# Patient Record
Sex: Female | Born: 1945
Health system: Southern US, Community
[De-identification: ages and names within clinical notes are randomized; demographics above are authoritative.]

## PROBLEM LIST (undated history)

## (undated) DIAGNOSIS — R011 Cardiac murmur, unspecified: Secondary | ICD-10-CM

## (undated) DIAGNOSIS — E039 Hypothyroidism, unspecified: Secondary | ICD-10-CM

## (undated) DIAGNOSIS — I639 Cerebral infarction, unspecified: Secondary | ICD-10-CM

## (undated) DIAGNOSIS — I341 Nonrheumatic mitral (valve) prolapse: Secondary | ICD-10-CM

## (undated) DIAGNOSIS — H04123 Dry eye syndrome of bilateral lacrimal glands: Secondary | ICD-10-CM

## (undated) DIAGNOSIS — R350 Frequency of micturition: Secondary | ICD-10-CM

## (undated) DIAGNOSIS — R0600 Dyspnea, unspecified: Secondary | ICD-10-CM

## (undated) DIAGNOSIS — I5189 Other ill-defined heart diseases: Secondary | ICD-10-CM

## (undated) DIAGNOSIS — G459 Transient cerebral ischemic attack, unspecified: Secondary | ICD-10-CM

## (undated) DIAGNOSIS — E785 Hyperlipidemia, unspecified: Secondary | ICD-10-CM

## (undated) DIAGNOSIS — Z8489 Family history of other specified conditions: Secondary | ICD-10-CM

## (undated) DIAGNOSIS — C801 Malignant (primary) neoplasm, unspecified: Secondary | ICD-10-CM

## (undated) DIAGNOSIS — C4490 Unspecified malignant neoplasm of skin, unspecified: Secondary | ICD-10-CM

## (undated) DIAGNOSIS — G43909 Migraine, unspecified, not intractable, without status migrainosus: Secondary | ICD-10-CM

## (undated) HISTORY — PX: ESOPHAGOGASTRODUODENOSCOPY: SHX1529

## (undated) HISTORY — PX: BREAST BIOPSY: SHX20

## (undated) HISTORY — PX: BREAST EXCISIONAL BIOPSY: SUR124

## (undated) HISTORY — DX: Hypothyroidism, unspecified: E03.9

## (undated) HISTORY — DX: Unspecified malignant neoplasm of skin, unspecified: C44.90

## (undated) HISTORY — DX: Hyperlipidemia, unspecified: E78.5

## (undated) HISTORY — PX: TUBAL LIGATION: SHX77

## (undated) HISTORY — DX: Cerebral infarction, unspecified: I63.9

## (undated) HISTORY — DX: Migraine, unspecified, not intractable, without status migrainosus: G43.909

## (undated) HISTORY — PX: CHOLECYSTECTOMY: SHX55

---

## 2002-01-05 ENCOUNTER — Ambulatory Visit (HOSPITAL_COMMUNITY): Admission: RE | Admit: 2002-01-05 | Discharge: 2002-01-05 | Payer: Self-pay | Admitting: Family Medicine

## 2002-01-05 ENCOUNTER — Encounter: Payer: Self-pay | Admitting: Family Medicine

## 2002-08-03 ENCOUNTER — Encounter: Payer: Self-pay | Admitting: Family Medicine

## 2002-08-03 ENCOUNTER — Ambulatory Visit (HOSPITAL_COMMUNITY): Admission: RE | Admit: 2002-08-03 | Discharge: 2002-08-03 | Payer: Self-pay | Admitting: Family Medicine

## 2002-08-09 ENCOUNTER — Ambulatory Visit (HOSPITAL_COMMUNITY): Admission: RE | Admit: 2002-08-09 | Discharge: 2002-08-09 | Payer: Self-pay | Admitting: Family Medicine

## 2002-08-09 ENCOUNTER — Encounter: Payer: Self-pay | Admitting: Family Medicine

## 2002-11-29 ENCOUNTER — Encounter: Payer: Self-pay | Admitting: Family Medicine

## 2002-11-29 ENCOUNTER — Ambulatory Visit (HOSPITAL_COMMUNITY): Admission: RE | Admit: 2002-11-29 | Discharge: 2002-11-29 | Payer: Self-pay | Admitting: Family Medicine

## 2003-03-03 ENCOUNTER — Ambulatory Visit (HOSPITAL_COMMUNITY): Admission: RE | Admit: 2003-03-03 | Discharge: 2003-03-03 | Payer: Self-pay | Admitting: Family Medicine

## 2003-03-08 ENCOUNTER — Ambulatory Visit (HOSPITAL_COMMUNITY): Admission: RE | Admit: 2003-03-08 | Discharge: 2003-03-08 | Payer: Self-pay | Admitting: *Deleted

## 2004-04-11 ENCOUNTER — Ambulatory Visit (HOSPITAL_COMMUNITY): Admission: RE | Admit: 2004-04-11 | Discharge: 2004-04-11 | Payer: Self-pay | Admitting: Family Medicine

## 2004-08-05 ENCOUNTER — Ambulatory Visit: Payer: Self-pay | Admitting: Internal Medicine

## 2004-08-13 ENCOUNTER — Ambulatory Visit: Payer: Self-pay | Admitting: Internal Medicine

## 2004-08-13 ENCOUNTER — Ambulatory Visit (HOSPITAL_COMMUNITY): Admission: RE | Admit: 2004-08-13 | Discharge: 2004-08-13 | Payer: Self-pay

## 2004-10-17 ENCOUNTER — Ambulatory Visit: Payer: Self-pay | Admitting: Orthopedic Surgery

## 2004-11-11 ENCOUNTER — Ambulatory Visit: Payer: Self-pay | Admitting: Orthopedic Surgery

## 2004-12-09 ENCOUNTER — Ambulatory Visit: Payer: Self-pay | Admitting: Orthopedic Surgery

## 2006-03-27 ENCOUNTER — Ambulatory Visit (HOSPITAL_COMMUNITY): Admission: RE | Admit: 2006-03-27 | Discharge: 2006-03-27 | Payer: Self-pay | Admitting: Family Medicine

## 2006-04-07 ENCOUNTER — Ambulatory Visit: Payer: Self-pay | Admitting: Orthopedic Surgery

## 2006-09-01 ENCOUNTER — Ambulatory Visit (HOSPITAL_COMMUNITY): Admission: RE | Admit: 2006-09-01 | Discharge: 2006-09-01 | Payer: Self-pay | Admitting: *Deleted

## 2008-08-03 ENCOUNTER — Ambulatory Visit (HOSPITAL_COMMUNITY): Admission: RE | Admit: 2008-08-03 | Discharge: 2008-08-03 | Payer: Self-pay | Admitting: Family Medicine

## 2008-08-03 ENCOUNTER — Encounter: Payer: Self-pay | Admitting: Family Medicine

## 2008-08-08 ENCOUNTER — Ambulatory Visit (HOSPITAL_COMMUNITY): Admission: RE | Admit: 2008-08-08 | Discharge: 2008-08-08 | Payer: Self-pay | Admitting: Family Medicine

## 2008-08-15 ENCOUNTER — Encounter (INDEPENDENT_AMBULATORY_CARE_PROVIDER_SITE_OTHER): Payer: Self-pay | Admitting: Diagnostic Radiology

## 2008-08-15 ENCOUNTER — Ambulatory Visit (HOSPITAL_COMMUNITY): Admission: RE | Admit: 2008-08-15 | Discharge: 2008-08-15 | Payer: Self-pay | Admitting: Family Medicine

## 2009-04-11 ENCOUNTER — Ambulatory Visit (HOSPITAL_COMMUNITY): Admission: RE | Admit: 2009-04-11 | Discharge: 2009-04-11 | Payer: Self-pay | Admitting: Endocrinology

## 2009-08-31 ENCOUNTER — Encounter (INDEPENDENT_AMBULATORY_CARE_PROVIDER_SITE_OTHER): Payer: Self-pay

## 2009-09-03 ENCOUNTER — Ambulatory Visit (HOSPITAL_COMMUNITY): Admission: RE | Admit: 2009-09-03 | Discharge: 2009-09-03 | Payer: Self-pay | Admitting: Endocrinology

## 2010-04-30 NOTE — Letter (Signed)
Summary: Recall, Screening Colonoscopy Only  [facility]  [address]   [place], [place] [address]   Phone: [phone]  Fax: [phone]    August 31, 2009  [facility] [address] [place], [place]  [address] [birth year]   Dear Ms. Grothe,   Our records indicate it is time to schedule your colonoscopy.   Please call our office at [phone] and ask for the nurse.   Thank you,    Hendricks Limes, LPN Cloria Spring, LPN  [facility] Ph: [phone]   Fax: [phone]

## 2010-05-08 ENCOUNTER — Ambulatory Visit (HOSPITAL_COMMUNITY)
Admission: RE | Admit: 2010-05-08 | Discharge: 2010-05-08 | Disposition: A | Discharge status: Home / Self Care | Payer: 59 | Source: Ambulatory Visit | Attending: Family Medicine | Admitting: Family Medicine

## 2010-05-08 ENCOUNTER — Other Ambulatory Visit (HOSPITAL_COMMUNITY): Payer: Self-pay | Admitting: Family Medicine

## 2010-05-08 ENCOUNTER — Encounter (HOSPITAL_COMMUNITY): Payer: Self-pay

## 2010-05-08 DIAGNOSIS — M542 Cervicalgia: Secondary | ICD-10-CM

## 2010-05-08 DIAGNOSIS — M25512 Pain in left shoulder: Secondary | ICD-10-CM

## 2010-05-08 DIAGNOSIS — M25519 Pain in unspecified shoulder: Secondary | ICD-10-CM | POA: Insufficient documentation

## 2010-05-08 DIAGNOSIS — M5030 Other cervical disc degeneration, unspecified cervical region: Secondary | ICD-10-CM | POA: Insufficient documentation

## 2010-08-16 NOTE — Op Note (Signed)
Grace [name], SHIMKUS                 ACCOUNT NO.:  [id number]   MEDICAL RECORD NO.:  [id number]          PATIENT TYPE:  AMB   LOCATION:  DAY                           FACILITY:  [facility]   PHYSICIAN:  Lionel December, M.D.    DATE OF BIRTH:  03/08/1946   DATE OF PROCEDURE:  08/13/2004  DATE OF DISCHARGE:                                 OPERATIVE REPORT   PROCEDURE:  Esophagogastroduodenoscopy with esophageal dilation followed by  colonoscopy with polypectomy.   INDICATION:  Walsie is a 65 year old Caucasian female who presents with  intermittent solid food dysphagia of at least 1 year duration. She points to  her suprasternal area as the site of bolus obstruction. She denies heartburn  or regurgitation. She is also undergoing surveillance colonoscopy. She had  adenomas removed on two prior colonoscopies. The first colonoscopy was in  67s, the last exam was about five years ago. The procedure risks were  reviewed with the patient, informed consent was obtained.   PREMEDICATION:  Cetacaine spray pharyngeal topical anesthesia, Demerol 50  milligrams IV, Versed 8 milligrams IV in divided dose.   FINDINGS:  Procedures performed in endoscopy suite. The patient's vital  signs and O2 sat were monitored during procedure and remained stable.   PROCEDURE:  1 - Esophagogastroduodenoscopy. The patient was placed left  lateral position and Olympus videoscope was passed via oropharynx without  any difficulty into the esophagus.   Esophagus. Mucosa was normal. There was suspicion of web in cervical  esophagus. This not very prominent. GE junction was at 38 cm from the  incisors. No ring or hernia was noted.   Stomach. It was empty and distended very well with insufflation. Folds of  proximal stomach were normal. Examination of mucosa revealed patchy erythema  at antrum and three small polyps at gastric body along the posterior wall,  which were ablated via cold biopsy. Pyloric channel was patent.  Angularis,  fundus and cardia examined by retroflexing the scope and were normal.   Duodenum.  Bulbar mucosa was normal. Scope was passed to the second part of  duodenum. The mucosa and folds were normal. Endoscope was withdrawn.   Esophagus was dilated by passing 56-[facility] dilator. As the dilator  was withdrawn endoscope was passed again and there was a small wide tear at  cervical esophagus disrupting the valve. Pictures taken for the record.  Endoscope was withdrawn and the patient prepared for procedure #2.   Colonoscopy. Rectal examination performed. No abnormality noted external or  digital exam. Olympus videoscope was placed rectum and advanced under vision  in sigmoid colon beyond. Preparation was satisfactory. Scope was passed  cecum which was identified by ileocecal valve and appendiceal orifice.  Pictures taken for the record. As the scope was withdrawn, colonic mucosa  was carefully examined. There were three small polyps midtransverse colon.  Two of these were snared and submitted in one container. A third polyp was  much smaller and simply coagulated using snare tip. Mucosa of rest of the  colon was normal. Rectal mucosa similarly was normal. Scope was retroflexed  to  examine anorectal junction which was unremarkable. Endoscope was  straightened and withdrawn. The patient tolerated the procedure well.   FINAL DIAGNOSIS:  Esophageal valve which was disrupted by passing 56-French  [facility] dilator. Three small gastric polyps at body which ablated via cold  biopsy. Nonerosive antral gastritis.  Two small polyps snared from the  transverse colon and third much smaller polyp was coagulated also at  transverse colon.   RECOMMENDATIONS:  Standard instructions given.  H pylori serology will be  checked. I will be contacting the patient with results of biopsy and blood  test and further recommendations. Given that she had polyps on all prior  colonoscopies she should  return for follow-up exam in 5 years from now.      NR/MEDQ  D:  08/13/2004  T:  08/13/2004  Job:  [id number]   cc:   Lorin Picket A. Gerda Diss, MD  [address]  [place]  [place] [address]  Fax: [phone]

## 2010-08-16 NOTE — H&P (Signed)
NAMEADELA, ESTEBAN                 ACCOUNT NO.:  [id number]   MEDICAL RECORD NO.:  [id number]          PATIENT TYPE:  AMB   LOCATION:  DAY                           FACILITY:  [facility]   PHYSICIAN:  Lionel December, M.D.    DATE OF BIRTH:  [birth year]   DATE OF ADMISSION:  DATE OF DISCHARGE:  LH                                HISTORY & PHYSICAL   PRIMARY CARE PHYSICIAN:  Scott A. Gerda Diss, M.D.   REASON:  Follow up colonoscopy.  History of adenomatous polyps.  EGD for  dysphagia.   HISTORY OF PRESENT ILLNESS:  Ms. Forton is a 65 year old Caucasian female  with a history of two adenomatous polyps on last colonoscopy by Dr. Karilyn Cota  in 2001.  She also has a history of previous adenomatous colon polyps.  She  is due for repeat surveillance exam.  She reports some constipation with  small, hard, round stools.  She also complains of abdominal bloating, which  has been worse over the last month.  She denies any problems with diarrhea  or loose stools.  She has a bowel movement basically every day or every  other day.  She denies any abdominal pain.  She denies any mucous in her  stools.  She does report at least once a week episodes of heartburn and  indigestion.  Denies any regurgitation.  She denies any odynophagia, but  does report dysphagia, and the sensation that her food will not go down.  This generally happens with solids.  Denies any problems with liquids.  She  has had some nausea.  Denies any emesis.  Denies any shortness of breath or  diaphoresis.  She takes an occasional ibuprofen.   PAST MEDICAL HISTORY:  1.  Mitral valve prolapse.  2.  History of adenomatous polyps diagnosed in the 1980s.  The last      colonoscopy in 2001 by Dr. Karilyn Cota with removal of 1, 2 and 3 mm polyps      from the cecum, which were adenomatous.  3.  Left breast benign breast tumor.  4.  Tubal ligation.  5.  Cholecystectomy in the 1980s.   CURRENT MEDICATIONS:  1.  Evista once daily.  2.  Tylenol p.r.n.  3.  Ibuprofen p.r.n.   ALLERGIES:  1.  SULFA which causes a rash.  2.  COMPAZINE which causes angioedema.   FAMILY HISTORY:  There is no known family history of colorectal carcinoma,  liver, or chronic GI problems.  Mother, age 41, with history of hypertension  and thyroid disease.  Father, age 46, is healthy.   SOCIAL HISTORY:  Ms. Nottingham has been married for 34 years.  She has 2 grown  healthy daughters.  She is employed full-time with [employer].  She  denies any tobacco, alcohol, or drug use.   REVIEW OF SYSTEMS:  CONSTITUTIONAL:  Stable.  She denies any anorexia.  Denies any fever or chills.  CARDIOVASCULAR:  Denies any chest pain or  palpitations.  PULMONARY:  Denies any shortness of breath, dyspnea, cough,  or hemoptysis.  GI:  See HPI.  GYN:  She is postmenopausal.  Denies any  problems with vaginal bleeding.   PHYSICAL EXAMINATION:  VITAL SIGNS:  Weight 143 pounds, height 63 inches,  temperature 98.4, blood pressure 110/68, pulse 72.  GENERAL:  Ms. Stolze is a healthy, well-developed, well-nourished Caucasian  female in no acute distress.  HEENT:  Pupils equal, round and reactive to light.  Sclerae are clear and  nonicteric.  Conjunctivae pink.  Oropharynx pink and moist without lesions.  NECK:  Supple without any mass or thyromegaly.  HEART:  Regular rate and rhythm with a 3/6 systolic murmur.  No clicks,  rubs, or gallops.  LUNGS:  Clear to auscultation bilaterally.  ABDOMEN:  Positive bowel sounds x4.  Soft, nontender, nondistended, without  palpable mass or hepatosplenomegaly.  No rebound tenderness or guarding.  RECTAL:  Deferred.  EXTREMITIES:  There were 2+ pedal pulses bilaterally.  Trace edema.  SKIN:  Pink, warm, and dry without any rash or jaundice.   IMPRESSION:  Ms. Latella is a 65 year old Caucasian female with history of  adenomatous colon polyps who is due for colonoscopy at this time.  She  agrees with this plan.   She also has a history of  intermittent nausea and solid food dysphagia,  which she be further evaluated at this time.  She notes intermittent weekly  or more heartburn and indigestion.  She is not currently on PPI therapy.   RECOMMENDATIONS:  1.  Will schedule colonoscopy with possible polypectomy by Dr. Karilyn Cota in the      near future.  Will also schedule EGD with possible esophageal dilatation      at the same time to further assess her upper GI tract and rule out      complications of GERD, including the development of web, ring, or      stricture.  I have discussed both procedures including risks and      benefits including, but not limited to, bleeding, infection,      perforation, drug reaction.  She agrees with this plan, and consent will      be obtained.  2.  She may need to begin PPI therapy.  3.  Will give SBE prophylaxis, given history of mitral valve prolapse.  4.  Further recommendations to follow.     KC/MEDQ  D:  08/05/2004  T:  08/05/2004  Job:  [id number]   cc:   Ernestina Penna  [address]  [place]  [place] [address]  Fax: [phone]

## 2010-10-11 ENCOUNTER — Other Ambulatory Visit (HOSPITAL_COMMUNITY): Payer: Self-pay | Admitting: Endocrinology

## 2010-10-11 DIAGNOSIS — E049 Nontoxic goiter, unspecified: Secondary | ICD-10-CM

## 2010-10-22 ENCOUNTER — Ambulatory Visit (HOSPITAL_COMMUNITY)
Admission: RE | Admit: 2010-10-22 | Discharge: 2010-10-22 | Disposition: A | Discharge status: Home / Self Care | Payer: 59 | Source: Ambulatory Visit | Attending: Endocrinology | Admitting: Endocrinology

## 2010-10-22 DIAGNOSIS — E079 Disorder of thyroid, unspecified: Secondary | ICD-10-CM | POA: Insufficient documentation

## 2010-10-22 DIAGNOSIS — E049 Nontoxic goiter, unspecified: Secondary | ICD-10-CM

## 2010-11-01 ENCOUNTER — Telehealth (INDEPENDENT_AMBULATORY_CARE_PROVIDER_SITE_OTHER): Payer: Self-pay | Admitting: *Deleted

## 2010-11-01 ENCOUNTER — Encounter (INDEPENDENT_AMBULATORY_CARE_PROVIDER_SITE_OTHER): Payer: Self-pay | Admitting: *Deleted

## 2010-11-01 DIAGNOSIS — Z86010 Personal history of colonic polyps: Secondary | ICD-10-CM

## 2010-11-01 NOTE — Telephone Encounter (Signed)
TCS sch'd 12/18/10 @ 1:00 (12:00), osmo prep instr given

## 2010-11-11 MED ORDER — SOD PHOS MONO-SOD PHOS DIBASIC 1.102-0.398 G PO TABS: 1.0000 | ORAL_TABLET | Freq: Once | ORAL | Status: DC

## 2010-11-28 ENCOUNTER — Encounter: Payer: Self-pay | Admitting: Adult Health

## 2010-11-28 ENCOUNTER — Ambulatory Visit (INDEPENDENT_AMBULATORY_CARE_PROVIDER_SITE_OTHER): Payer: 59 | Admitting: Adult Health

## 2010-11-28 DIAGNOSIS — R0609 Other forms of dyspnea: Secondary | ICD-10-CM

## 2010-11-28 DIAGNOSIS — R0989 Other specified symptoms and signs involving the circulatory and respiratory systems: Secondary | ICD-10-CM

## 2010-11-28 DIAGNOSIS — E039 Hypothyroidism, unspecified: Secondary | ICD-10-CM

## 2010-11-28 DIAGNOSIS — E7800 Pure hypercholesterolemia, unspecified: Secondary | ICD-10-CM

## 2010-11-28 DIAGNOSIS — R0789 Other chest pain: Secondary | ICD-10-CM

## 2010-11-28 DIAGNOSIS — R0600 Dyspnea, unspecified: Secondary | ICD-10-CM | POA: Insufficient documentation

## 2010-11-28 DIAGNOSIS — R002 Palpitations: Secondary | ICD-10-CM

## 2010-11-28 NOTE — Patient Instructions (Signed)
**Note De-Identified Grace [name] Obfuscation** Your physician recommends that you return for lab work in: today  Your physician has requested that you have a stress echocardiogram. For further information please visit [email or web address] Please follow instruction sheet as given.  A chest x-ray takes a picture of the organs and structures inside the chest, including the heart, lungs, and blood vessels. This test can show several things, including, whether the heart is enlarges; whether fluid is building up in the lungs; and whether pacemaker / defibrillator leads are still in place.  Your physician recommends that you schedule a follow-up appointment in: after test

## 2010-11-28 NOTE — Progress Notes (Signed)
HPI:Grace [name] is a pleasant 65 y/o patient we are seeing that the kind request of Dr. Gerda Diss for patient complaints of chest pressure, DOE, and palpitations. She has been having these symptoms over the last few months and has had progressive fatigue. She is recently widowed in March of this year and her mother died in 2022/07/20 of this year. She feels a lot of what she is experiencing is a delayed stress reaction from these family tragedies, but after seeing Dr. Gerda Diss he wanted her to be checked out by cardiology, as is appropriate.  She has a history of hypothyroidism and hypercholesterolemia.  She states the chest pressure occurs at rest. Palpitations are almost daily. She is also having some right flank pain and is requesting a urinalysis, She denies dizziness or NVD associated with her symptoms.  Allergies  Allergen Reactions  . Compazine   . Sulfa Drugs Cross Reactors     Current Outpatient Prescriptions  Medication Sig Dispense Refill  . Calcium Carbonate-Vitamin D (OSCAL 500/200 D-3 PO) Take by mouth daily.        Marland Kitchen levothyroxine (SYNTHROID, LEVOTHROID) 25 MCG tablet       . Multiple Vitamins-Minerals (ALIVE WOMENS 50+) TABS Take by mouth daily.        . sodium phosphates (OSMOPREP) 1.102-0.398 G TABS Take 1 tablet by mouth once.  32 tablet  0    Past Medical History  Diagnosis Date  . Hypothyroid   . Hyperlipidemia     Diet controlled    Past Surgical History  Procedure Date  . Breast biopsy   . Tubal ligation     38 yrs ago.    Family History  Problem Relation Age of Onset  . Heart disease Mother   . Hypertension Mother     History   Social History  . Marital Status: Married    Spouse Name: N/A    Number of Children: N/A  . Years of Education: N/A   Occupational History  . Not on file.   Social History Main Topics  . Smoking status: Never Smoker   . Smokeless tobacco: Not on file  . Alcohol Use: Not on file  . Drug Use: Not on file  . Sexually Active: Not on  file   Other Topics Concern  . Not on file   Social History Narrative  . No narrative on file    BJY:NWGNFA of systems complete and found to be negative unless listed above  PHYSICAL EXAM BP 103/60  Pulse 87  Resp 18  Ht 5\' 3"  (1.6 m)  Wt 141 lb 1.9 oz (64.012 kg)  BMI 25.00 kg/m2  SpO2 99% General: Well developed, well nourished, in no acute distress Head: Eyes PERRLA, No xanthomas.   Normal cephalic and atramatic  Lungs: Clear bilaterally to auscultation and percussion. Heart: HRRR S1 S2,.  Pulses are 2+ & equal.            No carotid bruit. No JVD.  No abdominal bruits. No femoral bruits. Abdomen: Bowel sounds are positive, abdomen soft and non-tender without masses or                  Hernia's noted. Msk:  Back normal, normal gait. Normal strength and tone for age. Extremities: No clubbing, cyanosis or edema.  DP +1 Neuro: Alert and oriented X 3. Psych:  Good affect, responds appropriately  EKG: NSR rate of 80 bpm.  RSR' in V1 suggesting RV conduction delay.  Anteroseptal infarct with  T-Wave inversion V1, V2 and aVR.(No prior EKG to compare in E-chart, MUSE, or EMR)  ASSESSMENT AND PLAN

## 2010-11-28 NOTE — Assessment & Plan Note (Signed)
Most recent labs completed this month TC 137, HDL-31, LDL 79, TG 137.  She is not on a statin now, but had been in the recent past. She wished to be diet controlled. Dr. Gerda Diss will check her again in 3 months.

## 2010-11-28 NOTE — Assessment & Plan Note (Signed)
The patient has been seen and examined by me and by Dr. Dietrich Pates in clinic. She has some symptoms suggestive of cardiac etiology for dyspnea. This can also be emotionally induced via anxiety, however there does not appear to be a straight forward diagnosis of this and no evidence of this in clinic.  Plan to have patient scheduled for a stress echo. CBC will be completed as well.  She will follow-up with Dr. Dietrich Pates for discussion of test results.

## 2010-12-03 ENCOUNTER — Other Ambulatory Visit: Payer: Self-pay

## 2010-12-03 ENCOUNTER — Ambulatory Visit (HOSPITAL_COMMUNITY)
Admission: RE | Admit: 2010-12-03 | Discharge: 2010-12-03 | Disposition: A | Discharge status: Home / Self Care | Payer: 59 | Source: Ambulatory Visit | Attending: Adult Health | Admitting: Adult Health

## 2010-12-03 DIAGNOSIS — R0609 Other forms of dyspnea: Secondary | ICD-10-CM

## 2010-12-03 DIAGNOSIS — R0789 Other chest pain: Secondary | ICD-10-CM | POA: Insufficient documentation

## 2010-12-03 DIAGNOSIS — R0989 Other specified symptoms and signs involving the circulatory and respiratory systems: Secondary | ICD-10-CM

## 2010-12-03 DIAGNOSIS — R0602 Shortness of breath: Secondary | ICD-10-CM | POA: Insufficient documentation

## 2010-12-04 LAB — CBC WITH DIFFERENTIAL/PLATELET
Basophils Absolute: 0 10*3/uL (ref 0.0–0.1)
Basophils Relative: 1 % (ref 0–1)
Eosinophils Absolute: 0.2 10*3/uL (ref 0.0–0.7)
Eosinophils Relative: 2 % (ref 0–5)
HCT: 40.8 % (ref 36.0–46.0)
Hemoglobin: 13.9 g/dL (ref 12.0–15.0)
Lymphocytes Relative: 32 % (ref 12–46)
Lymphs Abs: 2.5 10*3/uL (ref 0.7–4.0)
MCH: 30.6 pg (ref 26.0–34.0)
MCHC: 34.1 g/dL (ref 30.0–36.0)
MCV: 89.9 fL (ref 78.0–100.0)
Monocytes Absolute: 0.7 10*3/uL (ref 0.1–1.0)
Monocytes Relative: 10 % (ref 3–12)
Neutro Abs: 4.4 10*3/uL (ref 1.7–7.7)
Neutrophils Relative %: 57 % (ref 43–77)
Platelets: 177 10*3/uL (ref 150–400)
RBC: 4.54 MIL/uL (ref 3.87–5.11)
RDW: 12.9 % (ref 11.5–15.5)
WBC: 7.8 10*3/uL (ref 4.0–10.5)

## 2010-12-09 ENCOUNTER — Telehealth: Payer: Self-pay | Admitting: Cardiology

## 2010-12-09 NOTE — Telephone Encounter (Signed)
Patient would like results of lab work and CXR / tg

## 2010-12-13 ENCOUNTER — Ambulatory Visit: Payer: 59 | Admitting: Cardiology

## 2010-12-17 MED ORDER — SODIUM CHLORIDE 0.45 % IV SOLN: Freq: Once | INTRAVENOUS | Status: DC

## 2010-12-18 ENCOUNTER — Encounter (HOSPITAL_COMMUNITY)
Admission: RE | Disposition: A | Discharge status: Home / Self Care | Payer: Self-pay | Source: Ambulatory Visit | Attending: Internal Medicine

## 2010-12-18 ENCOUNTER — Other Ambulatory Visit (INDEPENDENT_AMBULATORY_CARE_PROVIDER_SITE_OTHER): Payer: Self-pay | Admitting: Internal Medicine

## 2010-12-18 ENCOUNTER — Encounter (HOSPITAL_COMMUNITY): Payer: Self-pay | Admitting: *Deleted

## 2010-12-18 ENCOUNTER — Ambulatory Visit (HOSPITAL_COMMUNITY)
Admission: RE | Admit: 2010-12-18 | Discharge: 2010-12-18 | Disposition: A | Discharge status: Home / Self Care | Payer: 59 | Source: Ambulatory Visit | Attending: Internal Medicine | Admitting: Internal Medicine

## 2010-12-18 DIAGNOSIS — Z860100 Personal history of colon polyps, unspecified: Secondary | ICD-10-CM | POA: Insufficient documentation

## 2010-12-18 DIAGNOSIS — D126 Benign neoplasm of colon, unspecified: Secondary | ICD-10-CM

## 2010-12-18 DIAGNOSIS — Z1211 Encounter for screening for malignant neoplasm of colon: Secondary | ICD-10-CM

## 2010-12-18 DIAGNOSIS — E785 Hyperlipidemia, unspecified: Secondary | ICD-10-CM | POA: Insufficient documentation

## 2010-12-18 HISTORY — PX: COLONOSCOPY: SHX5424

## 2010-12-18 SURGERY — COLONOSCOPY
Anesthesia: Moderate Sedation

## 2010-12-18 MED ORDER — MIDAZOLAM HCL 5 MG/5ML IJ SOLN
INTRAMUSCULAR | Status: DC | PRN
  Administered 2010-12-18 (×2): 1 mg via INTRAVENOUS
  Administered 2010-12-18: 2 mg via INTRAVENOUS
  Administered 2010-12-18: 1 mg via INTRAVENOUS
  Administered 2010-12-18: 2 mg via INTRAVENOUS

## 2010-12-18 MED ORDER — MEPERIDINE HCL 50 MG/ML IJ SOLN
INTRAMUSCULAR | Status: DC | PRN
  Administered 2010-12-18 (×2): 25 mg via INTRAVENOUS

## 2010-12-18 MED ORDER — MEPERIDINE HCL 50 MG/ML IJ SOLN
INTRAMUSCULAR | Status: AC
  Filled 2010-12-18: qty 1

## 2010-12-18 MED ORDER — MIDAZOLAM HCL 5 MG/5ML IJ SOLN
INTRAMUSCULAR | Status: AC
  Filled 2010-12-18: qty 10

## 2010-12-18 NOTE — Op Note (Signed)
COLONOSCOPY PROCEDURE REPORT  PATIENT:  Grace [name]  MR#:  [id number] Birthdate:  08-07-1945, 65 y.o., female Endoscopist:  Dr. Malissa Hippo, MD Referred By:  Dr. Lilyan Punt, MD Procedure Date: 12/18/2010  Procedure:   Colonoscopy  Indications: History of colonic adenoma.  Informed Consent:  Procedure and risks were reviewed with the patient and informed consent was obtained  Medications:  Demerol 50 mg IV Versed 7 mg IV  Description of procedure:  After a digital rectal exam was performed, that colonoscope was advanced from the anus through the rectum and colon to the area of the cecum, ileocecal valve and appendiceal orifice. The cecum was deeply intubated. These structures were well-seen and photographed for the record. From the level of the cecum and ileocecal valve, the scope was slowly and cautiously withdrawn. The mucosal surfaces were carefully surveyed utilizing scope tip to flexion to facilitate fold flattening as needed. The scope was pulled down into the rectum where a thorough exam including retroflexion was performed.  Findings:   Prep excellent. 2 small polyps ablated via cold biopsy from ascending colon and submitted in one container. 5 mm polyp ablated via cold biopsy from proximal transverse colon. 4 mm polyp ablated via cold biopsy from distal transverse colon. 4 mm polyp ablated via cold biopsy from descending colon. Rest of the examination was normal.  Therapeutic/Diagnostic Maneuvers Performed:  See above  Complications:  None  Cecal Withdrawal Time:  25 minutes  Impression:  Examination performed to cecum. 5 small polyps ablated via cold biopsy; 2 from a sending colon and submitted in one container; 2 from transverse colon and submitted in separate containers and one from descending colon.  Recommendations:  Standard instructions given. I will be contacting patient with results of biopsy and further recommendations.  REHMAN,NAJEEB U  12/18/2010  1:25 PM  CC: Dr. Lilyan Punt, MD, MD & Dr. Bonnetta Barry ref. provider found

## 2010-12-18 NOTE — H&P (Signed)
Grace [name] is an 65 y.o. female.   Chief Complaint: Patient is here for colonoscopy. HPI: Patient is 65 year old Caucasian female who has history of colonic adenoma. Her last exam was in May 2006. She is presently free of any GI symptoms. She denies abdominal pain, change in her bowel habits or rectal bleeding. she has good appetite; family history is negative for colorectal carcinoma.  Past Medical History  Diagnosis Date  . Hypothyroid   . Hyperlipidemia     Diet controlled    Past Surgical History  Procedure Date  . Breast biopsy   . Tubal ligation     38 yrs ago.    Family History  Problem Relation Age of Onset  . Heart disease Mother   . Hypertension Mother    Social History:  reports that she has never smoked. She does not have any smokeless tobacco history on file. Her alcohol and drug histories not on file.  Allergies:  Allergies  Allergen Reactions  . Compazine   . Sulfa Drugs Cross Reactors     Medications Prior to Admission  Medication Dose Route Frequency Provider Last Rate Last Dose  . 0.45 % sodium chloride infusion   Intravenous Once Malissa Hippo, MD      . meperidine (DEMEROL) 50 MG/ML injection           . midazolam (VERSED) 5 MG/5ML injection            Medications Prior to Admission  Medication Sig Dispense Refill  . calcium-vitamin D (OSCAL-500) 500-400 MG-UNIT per tablet Take 1 tablet by mouth daily.        . ciprofloxacin (CIPRO) 500 MG tablet Take 500 mg by mouth 2 (two) times daily. Take for 10 days       . levothyroxine (SYNTHROID, LEVOTHROID) 25 MCG tablet       . sodium phosphates (OSMOPREP) 1.102-0.398 G TABS Take 1 tablet by mouth once.  32 tablet  0    No results found for this or any previous visit (from the past 48 hour(s)). No results found.  Review of Systems  Constitutional: Negative for weight loss.  Gastrointestinal: Negative for abdominal pain, diarrhea, constipation, blood in stool and melena.    Blood pressure 115/64,  pulse 62, temperature 98.1 F (36.7 C), temperature source Oral, resp. rate 22, height 5\' 3"  (1.6 m), weight 141 lb 1.9 oz (64.01 kg), SpO2 98.00%. Physical Exam  Constitutional: She appears well-developed and well-nourished.  HENT:  Mouth/Throat: Oropharynx is clear and moist.  Eyes: Conjunctivae are normal. No scleral icterus.  Neck: No thyromegaly present.  Cardiovascular: Normal rate, regular rhythm and normal heart sounds.   No murmur heard. Respiratory: Effort normal and breath sounds normal.  GI: Soft. She exhibits no distension and no mass. There is no tenderness.  Musculoskeletal: She exhibits no edema.  Lymphadenopathy:    She has no cervical adenopathy.  Neurological: She is alert.  Skin: Skin is warm and dry.     Assessment/Plan History of colonic adenoma. Surveillance colonoscopy  REHMAN,NAJEEB U 12/18/2010, 12:26 PM

## 2010-12-20 ENCOUNTER — Ambulatory Visit (HOSPITAL_COMMUNITY)
Admission: RE | Admit: 2010-12-20 | Discharge: 2010-12-20 | Disposition: A | Discharge status: Home / Self Care | Payer: 59 | Source: Ambulatory Visit | Attending: Cardiology | Admitting: Cardiology

## 2010-12-20 ENCOUNTER — Ambulatory Visit (HOSPITAL_COMMUNITY)
Admission: RE | Admit: 2010-12-20 | Discharge: 2010-12-20 | Disposition: A | Discharge status: Home / Self Care | Payer: 59 | Source: Ambulatory Visit | Attending: Adult Health | Admitting: Adult Health

## 2010-12-20 DIAGNOSIS — R079 Chest pain, unspecified: Secondary | ICD-10-CM | POA: Insufficient documentation

## 2010-12-20 DIAGNOSIS — R0989 Other specified symptoms and signs involving the circulatory and respiratory systems: Secondary | ICD-10-CM | POA: Insufficient documentation

## 2010-12-20 DIAGNOSIS — R0602 Shortness of breath: Secondary | ICD-10-CM

## 2010-12-20 DIAGNOSIS — R0609 Other forms of dyspnea: Secondary | ICD-10-CM | POA: Insufficient documentation

## 2010-12-20 DIAGNOSIS — R0600 Dyspnea, unspecified: Secondary | ICD-10-CM

## 2010-12-20 NOTE — Progress Notes (Signed)
Stress Lab Nurses Notes - [facility]  TAWANNA FUNK 12/20/2010  Reason for doing test: Dyspnea  Type of test: Stress Echo  Nurse performing test: Parke Poisson, RN  Nuclear Medicine Tech: Not Applicable  Echo Tech: Karrie Doffing  MD performing test: R. Dietrich Pates  Family MD: Lilyan Punt  Test explained and consent signed: yes  IV started: No IV started  Symptoms: mild SOB & fatigue  Treatment/Intervention: None  Reason test stopped: fatigue  After recovery IV was: NA  Patient to return to Nuc. Med at :NA  Patient discharged: Home  Patient's Condition upon discharge was: stable  Comments: Peak BP162/68 & HR 160.  Recovery BP 118/58 & HR 88.  Symptoms resolved in recovery  Erskine Speed T

## 2010-12-20 NOTE — Progress Notes (Signed)
*  PRELIMINARY RESULTS* Echocardiogram Echocardiogram Stress Test has been performed.  Grace [name] 12/20/2010, 11:16 AM

## 2010-12-24 ENCOUNTER — Encounter (HOSPITAL_COMMUNITY): Payer: Self-pay | Admitting: Internal Medicine

## 2010-12-26 ENCOUNTER — Encounter (INDEPENDENT_AMBULATORY_CARE_PROVIDER_SITE_OTHER): Payer: Self-pay | Admitting: *Deleted

## 2011-01-03 ENCOUNTER — Ambulatory Visit: Payer: 59 | Admitting: Cardiology

## 2011-04-01 DIAGNOSIS — G43909 Migraine, unspecified, not intractable, without status migrainosus: Secondary | ICD-10-CM

## 2011-04-01 HISTORY — DX: Migraine, unspecified, not intractable, without status migrainosus: G43.909

## 2011-10-16 ENCOUNTER — Other Ambulatory Visit (HOSPITAL_COMMUNITY): Payer: Self-pay | Admitting: Endocrinology

## 2011-10-16 DIAGNOSIS — E049 Nontoxic goiter, unspecified: Secondary | ICD-10-CM

## 2011-10-20 ENCOUNTER — Ambulatory Visit (HOSPITAL_COMMUNITY)
Admission: RE | Admit: 2011-10-20 | Discharge: 2011-10-20 | Disposition: A | Discharge status: Home / Self Care | Payer: BC Managed Care – PPO | Source: Ambulatory Visit | Attending: Endocrinology | Admitting: Endocrinology

## 2011-10-20 DIAGNOSIS — R1310 Dysphagia, unspecified: Secondary | ICD-10-CM | POA: Insufficient documentation

## 2011-10-20 DIAGNOSIS — E049 Nontoxic goiter, unspecified: Secondary | ICD-10-CM | POA: Insufficient documentation

## 2011-11-11 ENCOUNTER — Other Ambulatory Visit: Payer: Self-pay | Admitting: Otolaryngology

## 2011-11-11 DIAGNOSIS — D497 Neoplasm of unspecified behavior of endocrine glands and other parts of nervous system: Secondary | ICD-10-CM

## 2011-11-12 ENCOUNTER — Encounter (HOSPITAL_COMMUNITY): Payer: Self-pay | Admitting: Pharmacy Technician

## 2011-11-16 NOTE — H&P (Signed)
Assessment  . Dysphagia   (787.20) . 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 Discussed  She continues to have trouble swallowing especially things like bread. She had no trouble with liquids. She doesn't consume any caffeine. She's had serial thyroid ultrasounds. The most recent one section the dominant growths on the left side to have increased from 2.8 cm to 3.2 cm. Recommendation was made for thyroid lobectomy.   On exam, she is very healthy overall. Voice is normal. Breathing is clear. No palpable adenopathy in the neck. 2-3 cm rubbery mass left thyroid. Right-sided multi-nodular. Oral cavity and pharynx are clear. Indirect exam reveals normal vocal cord mobility with arytenoid mucosal edema.   Given the fact that the lesion has increased in size, recommend left thyroid lobectomy. This can be done with frozen section analysis and total thyroidectomy as indicated. Discussed the surgery in detail including the risks to the recurrent nerves and hypocalcemia. We discussed the possible need for secondary completion surgery depending on the frozen section. Recommend barium swallow for the difficulty swallowing. All questions were answered. She will contact us to schedule and she is ready. Reason For Visit  Thyroid nodule. Allergies  Compazine TABS Sulfa Drugs. Current Meds  Calcium TABS;; RPT Levothyroxine Sodium 75 MCG Oral Tablet;; RPT Multi-Vitamin TABS;; RPT ALPRAZolam 0.25 MG Oral Tablet;; RPT Pravastatin Sodium 20 MG Oral Tablet;; RPT Restasis 0.05 % Ophthalmic Emulsion;; RPT. Active Problems  Difficulty Swallowing (Dysphagia) (787.20) Esophageal Reflux (530.81) Neoplasm Located In The Thyroid Gland (239.7). PMH  Allergic Rhinitis (477.9) Chest Pain (786.50). PSH  Cholecystectomy Tubal Ligation (V25.2). Family Hx  Family history of Cancer Maternal history of Hypertension Maternal history of Stroke Syndrome. Personal  Hx  Never A Smoker Never Drank Alcohol. ROS  Systemic: Feeling tired (fatigue)  and night sweats. Gastrointestinal: Dysphagia. Vital Signs   Recorded by [facility] on 05 Nov 2011 01:12 PM BP:122/70,  Height: 62.5 in, Weight: 142 lb, BMI: 25.6 kg/m2,  BSA Calculated: 1.66 ,  BMI Calculated: 25.56.

## 2011-11-17 NOTE — Pre-Procedure Instructions (Signed)
20 FRUMA [place]  11/17/2011   Your procedure is scheduled on:  Thursday November 20, 2011.  Report to [facility] at 0645 AM.  Call this number if you have problems the morning of surgery: [phone]   Remember:   Do not eat food or drink:After Midnight.    Take these medicines the morning of surgery with A SIP OF WATER: Restasis eye drop, and Levothyroxine (Synthroid)   Do not wear jewelry, make-up or nail polish.  Do not wear lotions, powders, or perfumes.   Do not shave 48 hours prior to surgery.   Do not bring valuables to the hospital.  Contacts, dentures or bridgework may not be worn into surgery.  Leave suitcase in the car. After surgery it may be brought to your room.  For patients admitted to the hospital, checkout time is 11:00 AM the day of discharge.   Patients discharged the day of surgery will not be allowed to drive home.  Name and phone number of your driver:   Special Instructions: CHG Shower Use Special Wash: 1/2 bottle night before surgery and 1/2 bottle morning of surgery.   Please read over the following fact sheets that you were given: Pain Booklet, Coughing and Deep Breathing, MRSA Information and Surgical Site Infection Prevention

## 2011-11-18 ENCOUNTER — Encounter (HOSPITAL_COMMUNITY): Payer: Self-pay

## 2011-11-18 ENCOUNTER — Encounter (HOSPITAL_COMMUNITY)
Admission: RE | Admit: 2011-11-18 | Discharge: 2011-11-18 | Disposition: A | Discharge status: Home / Self Care | Payer: BC Managed Care – PPO | Source: Ambulatory Visit | Attending: Otolaryngology | Admitting: Otolaryngology

## 2011-11-18 ENCOUNTER — Ambulatory Visit
Admission: RE | Admit: 2011-11-18 | Discharge: 2011-11-18 | Disposition: A | Discharge status: Home / Self Care | Payer: BC Managed Care – PPO | Source: Ambulatory Visit | Attending: Otolaryngology | Admitting: Otolaryngology

## 2011-11-18 DIAGNOSIS — D497 Neoplasm of unspecified behavior of endocrine glands and other parts of nervous system: Secondary | ICD-10-CM

## 2011-11-18 HISTORY — DX: Dry eye syndrome of bilateral lacrimal glands: H04.123

## 2011-11-18 HISTORY — DX: Frequency of micturition: R35.0

## 2011-11-18 HISTORY — DX: Nonrheumatic mitral (valve) prolapse: I34.1

## 2011-11-18 HISTORY — DX: Cardiac murmur, unspecified: R01.1

## 2011-11-18 HISTORY — DX: Family history of other specified conditions: Z84.89

## 2011-11-18 HISTORY — DX: Malignant (primary) neoplasm, unspecified: C80.1

## 2011-11-18 LAB — CBC
HCT: 42.5 % (ref 36.0–46.0)
Hemoglobin: 14.6 g/dL (ref 12.0–15.0)
MCH: 30.2 pg (ref 26.0–34.0)
MCHC: 34.4 g/dL (ref 30.0–36.0)
MCV: 88 fL (ref 78.0–100.0)
Platelets: 188 10*3/uL (ref 150–400)
RBC: 4.83 MIL/uL (ref 3.87–5.11)
RDW: 12.5 % (ref 11.5–15.5)
WBC: 7 10*3/uL (ref 4.0–10.5)

## 2011-11-18 LAB — BASIC METABOLIC PANEL
BUN: 13 mg/dL (ref 6–23)
CO2: 31 mEq/L (ref 19–32)
Calcium: 9.9 mg/dL (ref 8.4–10.5)
Chloride: 104 mEq/L (ref 96–112)
Creatinine, Ser: 0.74 mg/dL (ref 0.50–1.10)
GFR calc Af Amer: >90 mL/min (ref >90)
GFR calc non Af Amer: 88 mL/min — ABNORMAL LOW (ref >90)
Glucose, Bld: 104 mg/dL — ABNORMAL HIGH (ref 70–99)
Potassium: 4.4 mEq/L (ref 3.5–5.1)
Sodium: 141 mEq/L (ref 135–145)

## 2011-11-18 LAB — SURGICAL PCR SCREEN
MRSA, PCR: NEGATIVE
Staphylococcus aureus: NEGATIVE

## 2011-11-19 MED ORDER — CEFAZOLIN SODIUM-DEXTROSE 2-3 GM-% IV SOLR
2.0000 g | INTRAVENOUS | Status: AC
  Administered 2011-11-20: 2 g via INTRAVENOUS
  Filled 2011-11-19: qty 50

## 2011-11-20 ENCOUNTER — Encounter (HOSPITAL_COMMUNITY)
Admission: RE | Disposition: A | Discharge status: Home / Self Care | Payer: Self-pay | Source: Ambulatory Visit | Attending: Otolaryngology

## 2011-11-20 ENCOUNTER — Ambulatory Visit (HOSPITAL_COMMUNITY): Payer: BC Managed Care – PPO | Admitting: Critical Care Medicine

## 2011-11-20 ENCOUNTER — Encounter (HOSPITAL_COMMUNITY): Payer: Self-pay | Admitting: Critical Care Medicine

## 2011-11-20 ENCOUNTER — Encounter (HOSPITAL_COMMUNITY): Payer: Self-pay | Admitting: *Deleted

## 2011-11-20 ENCOUNTER — Ambulatory Visit (HOSPITAL_COMMUNITY)
Admission: RE | Admit: 2011-11-20 | Discharge: 2011-11-21 | Disposition: A | Discharge status: Home / Self Care | Payer: BC Managed Care – PPO | Source: Ambulatory Visit | Attending: Otolaryngology | Admitting: Otolaryngology

## 2011-11-20 DIAGNOSIS — E063 Autoimmune thyroiditis: Secondary | ICD-10-CM | POA: Insufficient documentation

## 2011-11-20 DIAGNOSIS — Z9889 Other specified postprocedural states: Secondary | ICD-10-CM

## 2011-11-20 DIAGNOSIS — Z01812 Encounter for preprocedural laboratory examination: Secondary | ICD-10-CM | POA: Insufficient documentation

## 2011-11-20 DIAGNOSIS — E890 Postprocedural hypothyroidism: Secondary | ICD-10-CM

## 2011-11-20 DIAGNOSIS — K219 Gastro-esophageal reflux disease without esophagitis: Secondary | ICD-10-CM | POA: Insufficient documentation

## 2011-11-20 DIAGNOSIS — E039 Hypothyroidism, unspecified: Secondary | ICD-10-CM | POA: Insufficient documentation

## 2011-11-20 DIAGNOSIS — R1310 Dysphagia, unspecified: Secondary | ICD-10-CM | POA: Insufficient documentation

## 2011-11-20 DIAGNOSIS — Z01818 Encounter for other preprocedural examination: Secondary | ICD-10-CM | POA: Insufficient documentation

## 2011-11-20 HISTORY — PX: THYROIDECTOMY: SHX17

## 2011-11-20 SURGERY — THYROIDECTOMY
Anesthesia: General | Site: Neck | Laterality: Left | Wound class: Clean

## 2011-11-20 MED ORDER — ALIVE WOMENS 50+ PO TABS: 1.0000 | ORAL_TABLET | Freq: Every day | ORAL | Status: DC

## 2011-11-20 MED ORDER — LIDOCAINE HCL (CARDIAC) 20 MG/ML IV SOLN
INTRAVENOUS | Status: DC | PRN
  Administered 2011-11-20: 40 mg via INTRAVENOUS

## 2011-11-20 MED ORDER — ONDANSETRON 4 MG PO TBDP: 8.0000 mg | ORAL_TABLET | Freq: Three times a day (TID) | ORAL | Status: AC | PRN

## 2011-11-20 MED ORDER — DOUBLE ANTIBIOTIC 500-10000 UNIT/GM EX OINT
TOPICAL_OINTMENT | CUTANEOUS | Status: AC
  Filled 2011-11-20: qty 1

## 2011-11-20 MED ORDER — ONDANSETRON HCL 4 MG/2ML IJ SOLN
INTRAMUSCULAR | Status: DC | PRN
  Administered 2011-11-20: 4 mg via INTRAVENOUS

## 2011-11-20 MED ORDER — LACTATED RINGERS IV SOLN
INTRAVENOUS | Status: DC | PRN
  Administered 2011-11-20 (×2): via INTRAVENOUS

## 2011-11-20 MED ORDER — IBUPROFEN 100 MG/5ML PO SUSP: 400.0000 mg | Freq: Four times a day (QID) | ORAL | Status: DC | PRN

## 2011-11-20 MED ORDER — HYDROCODONE-ACETAMINOPHEN 5-325 MG PO TABS: 1.0000 | ORAL_TABLET | ORAL | Status: DC | PRN

## 2011-11-20 MED ORDER — HYDROCODONE-ACETAMINOPHEN 7.5-500 MG PO TABS: 1.0000 | ORAL_TABLET | Freq: Four times a day (QID) | ORAL | Status: AC | PRN

## 2011-11-20 MED ORDER — LIDOCAINE-EPINEPHRINE 1 %-1:100000 IJ SOLN
INTRAMUSCULAR | Status: DC | PRN
  Administered 2011-11-20: 3 mL

## 2011-11-20 MED ORDER — ADULT MULTIVITAMIN W/MINERALS CH
1.0000 | ORAL_TABLET | Freq: Every day | ORAL | Status: DC
  Administered 2011-11-20 – 2011-11-21 (×2): 1 via ORAL
  Filled 2011-11-20 (×2): qty 1

## 2011-11-20 MED ORDER — ONDANSETRON HCL 4 MG PO TABS: 4.0000 mg | ORAL_TABLET | ORAL | Status: DC | PRN

## 2011-11-20 MED ORDER — GLYCOPYRROLATE 0.2 MG/ML IJ SOLN
INTRAMUSCULAR | Status: DC | PRN
  Administered 2011-11-20: 0.6 mg via INTRAVENOUS

## 2011-11-20 MED ORDER — LACTATED RINGERS IV SOLN
INTRAVENOUS | Status: DC
  Administered 2011-11-20: 09:00:00 via INTRAVENOUS

## 2011-11-20 MED ORDER — PROPOFOL 10 MG/ML IV EMUL
INTRAVENOUS | Status: DC | PRN
  Administered 2011-11-20: 150 mg via INTRAVENOUS

## 2011-11-20 MED ORDER — ONDANSETRON HCL 4 MG/2ML IJ SOLN: 4.0000 mg | INTRAMUSCULAR | Status: DC | PRN

## 2011-11-20 MED ORDER — FENTANYL CITRATE 0.05 MG/ML IJ SOLN
INTRAMUSCULAR | Status: DC | PRN
  Administered 2011-11-20: 50 ug via INTRAVENOUS
  Administered 2011-11-20: 200 ug via INTRAVENOUS

## 2011-11-20 MED ORDER — MIDAZOLAM HCL 2 MG/2ML IJ SOLN: 0.5000 mg | Freq: Once | INTRAMUSCULAR | Status: DC | PRN

## 2011-11-20 MED ORDER — PROMETHAZINE HCL 25 MG/ML IJ SOLN
6.2500 mg | INTRAMUSCULAR | Status: DC | PRN
Start: 2011-11-20 — End: 2011-11-20
  Administered 2011-11-20: 6.25 mg via INTRAVENOUS

## 2011-11-20 MED ORDER — MEPERIDINE HCL 25 MG/ML IJ SOLN: 6.2500 mg | INTRAMUSCULAR | Status: DC | PRN

## 2011-11-20 MED ORDER — PROMETHAZINE HCL 25 MG/ML IJ SOLN
INTRAMUSCULAR | Status: AC
  Filled 2011-11-20: qty 1

## 2011-11-20 MED ORDER — SIMVASTATIN 5 MG PO TABS
5.0000 mg | ORAL_TABLET | Freq: Every day | ORAL | Status: DC
  Administered 2011-11-20: 5 mg via ORAL
  Filled 2011-11-20 (×2): qty 1

## 2011-11-20 MED ORDER — 0.9 % SODIUM CHLORIDE (POUR BTL) OPTIME
TOPICAL | Status: DC | PRN
  Administered 2011-11-20: 1000 mL

## 2011-11-20 MED ORDER — MIDAZOLAM HCL 5 MG/5ML IJ SOLN
INTRAMUSCULAR | Status: DC | PRN
  Administered 2011-11-20: 2 mg via INTRAVENOUS

## 2011-11-20 MED ORDER — ROCURONIUM BROMIDE 100 MG/10ML IV SOLN
INTRAVENOUS | Status: DC | PRN
  Administered 2011-11-20: 10 mg via INTRAVENOUS
  Administered 2011-11-20: 30 mg via INTRAVENOUS

## 2011-11-20 MED ORDER — HYDROMORPHONE HCL PF 1 MG/ML IJ SOLN: 0.2500 mg | INTRAMUSCULAR | Status: DC | PRN

## 2011-11-20 MED ORDER — LIDOCAINE HCL 4 % MT SOLN
OROMUCOSAL | Status: DC | PRN
  Administered 2011-11-20: 4 mL via TOPICAL

## 2011-11-20 MED ORDER — LIDOCAINE-EPINEPHRINE 1 %-1:100000 IJ SOLN
INTRAMUSCULAR | Status: AC
  Filled 2011-11-20: qty 1

## 2011-11-20 MED ORDER — LEVOTHYROXINE SODIUM 25 MCG PO TABS
25.0000 ug | ORAL_TABLET | Freq: Every day | ORAL | Status: DC
  Administered 2011-11-21: 25 ug via ORAL
  Filled 2011-11-20: qty 1

## 2011-11-20 MED ORDER — NEOSTIGMINE METHYLSULFATE 1 MG/ML IJ SOLN
INTRAMUSCULAR | Status: DC | PRN
  Administered 2011-11-20: 4 mg via INTRAVENOUS

## 2011-11-20 MED ORDER — CYCLOSPORINE 0.05 % OP EMUL
1.0000 [drp] | Freq: Two times a day (BID) | OPHTHALMIC | Status: DC
  Administered 2011-11-20 – 2011-11-21 (×2): 1 [drp] via OPHTHALMIC
  Filled 2011-11-20 (×3): qty 1

## 2011-11-20 MED ORDER — PHENYLEPHRINE HCL 10 MG/ML IJ SOLN
10.0000 mg | INTRAVENOUS | Status: DC | PRN
  Administered 2011-11-20: 25 ug/min via INTRAVENOUS

## 2011-11-20 MED ORDER — DEXAMETHASONE SODIUM PHOSPHATE 4 MG/ML IJ SOLN
INTRAMUSCULAR | Status: DC | PRN
  Administered 2011-11-20: 4 mg via INTRAVENOUS

## 2011-11-20 MED ORDER — EPHEDRINE SULFATE 50 MG/ML IJ SOLN
INTRAMUSCULAR | Status: DC | PRN
  Administered 2011-11-20 (×2): 10 mg via INTRAVENOUS

## 2011-11-20 MED ORDER — DEXTROSE-NACL 5-0.9 % IV SOLN
INTRAVENOUS | Status: DC
  Administered 2011-11-20: 17:00:00 via INTRAVENOUS

## 2011-11-20 MED ORDER — IBUPROFEN 400 MG PO TABS
400.0000 mg | ORAL_TABLET | Freq: Four times a day (QID) | ORAL | Status: DC | PRN
  Filled 2011-11-20: qty 1

## 2011-11-20 MED ORDER — SUCCINYLCHOLINE CHLORIDE 20 MG/ML IJ SOLN
INTRAMUSCULAR | Status: DC | PRN
  Administered 2011-11-20: 100 mg via INTRAVENOUS

## 2011-11-20 SURGICAL SUPPLY — 44 items
APPLIER CLIP 9.375 SM OPEN (CLIP)
ATTRACTOMAT 16X20 MAGNETIC DRP (DRAPES) IMPLANT
CANISTER SUCTION 2500CC (MISCELLANEOUS) ×2 IMPLANT
CLEANER TIP ELECTROSURG 2X2 (MISCELLANEOUS) ×2 IMPLANT
CLIP APPLIE 9.375 SM OPEN (CLIP) IMPLANT
CLOTH BEACON ORANGE TIMEOUT ST (SAFETY) ×2 IMPLANT
CONT SPEC 4OZ CLIKSEAL STRL BL (MISCELLANEOUS) ×2 IMPLANT
CORDS BIPOLAR (ELECTRODE) ×2 IMPLANT
COVER SURGICAL LIGHT HANDLE (MISCELLANEOUS) ×2 IMPLANT
DECANTER SPIKE VIAL GLASS SM (MISCELLANEOUS) ×2 IMPLANT
DERMABOND ADVANCED (GAUZE/BANDAGES/DRESSINGS) ×1
DERMABOND ADVANCED .7 DNX12 (GAUZE/BANDAGES/DRESSINGS) ×1 IMPLANT
DRAIN SNY 10 ROU (WOUND CARE) IMPLANT
ELECT COATED BLADE 2.86 ST (ELECTRODE) ×2 IMPLANT
ELECT REM PT RETURN 9FT ADLT (ELECTROSURGICAL) ×2
ELECTRODE REM PT RTRN 9FT ADLT (ELECTROSURGICAL) ×1 IMPLANT
EVACUATOR SILICONE 100CC (DRAIN) ×2 IMPLANT
GAUZE SPONGE 4X4 16PLY XRAY LF (GAUZE/BANDAGES/DRESSINGS) ×2 IMPLANT
GLOVE BIO SURGEON STRL SZ 6.5 (GLOVE) ×4 IMPLANT
GLOVE BIOGEL PI IND STRL 6.5 (GLOVE) ×1 IMPLANT
GLOVE BIOGEL PI INDICATOR 6.5 (GLOVE) ×1
GLOVE ECLIPSE 7.5 STRL STRAW (GLOVE) ×2 IMPLANT
GLOVE SURG SS PI 6.5 STRL IVOR (GLOVE) ×4 IMPLANT
GOWN STRL NON-REIN LRG LVL3 (GOWN DISPOSABLE) ×6 IMPLANT
KIT BASIN OR (CUSTOM PROCEDURE TRAY) ×2 IMPLANT
KIT ROOM TURNOVER OR (KITS) ×2 IMPLANT
NEEDLE 27GAX1X1/2 (NEEDLE) ×2 IMPLANT
NS IRRIG 1000ML POUR BTL (IV SOLUTION) ×2 IMPLANT
PAD ARMBOARD 7.5X6 YLW CONV (MISCELLANEOUS) ×4 IMPLANT
PENCIL FOOT CONTROL (ELECTRODE) ×2 IMPLANT
SPECIMEN JAR MEDIUM (MISCELLANEOUS) IMPLANT
SPONGE INTESTINAL PEANUT (DISPOSABLE) IMPLANT
STAPLER VISISTAT 35W (STAPLE) ×2 IMPLANT
SUT CHROMIC 3 0 SH 27 (SUTURE) IMPLANT
SUT CHROMIC 4 0 PS 2 18 (SUTURE) ×2 IMPLANT
SUT ETHILON 3 0 PS 1 (SUTURE) IMPLANT
SUT ETHILON 5 0 P 3 18 (SUTURE) ×1
SUT NYLON ETHILON 5-0 P-3 1X18 (SUTURE) ×1 IMPLANT
SUT SILK 3 0 REEL (SUTURE) ×2 IMPLANT
SUT SILK 4 0 REEL (SUTURE) ×2 IMPLANT
TOWEL OR 17X24 6PK STRL BLUE (TOWEL DISPOSABLE) IMPLANT
TOWEL OR 17X26 10 PK STRL BLUE (TOWEL DISPOSABLE) ×2 IMPLANT
TRAY ENT MC OR (CUSTOM PROCEDURE TRAY) ×2 IMPLANT
WATER STERILE IRR 1000ML POUR (IV SOLUTION) IMPLANT

## 2011-11-20 NOTE — Progress Notes (Signed)
Adm. Nurse notified of room and MR# to complete adm. h/p

## 2011-11-20 NOTE — Progress Notes (Signed)
Patient ID: Grace [name], female   DOB: 1946-02-11, 66 y.o.   MRN: [id number]  Doing very well, no complaints. Swallowing already better. Voice normal. JP functioning. Incision excellent. Stable post op. Continue overnight observation.

## 2011-11-20 NOTE — Interval H&P Note (Signed)
History and Physical Interval Note:  11/20/2011 8:31 AM  Grace [name]  has presented today for surgery, with the diagnosis of thyroid nodule   The various methods of treatment have been discussed with the patient and family. After consideration of risks, benefits and other options for treatment, the patient has consented to  Procedure(s) (LRB): THYROIDECTOMY (Left) as a surgical intervention .  The patient's history has been reviewed, patient examined, no change in status, stable for surgery.  I have reviewed the patient's chart and labs.  Questions were answered to the patient's satisfaction.     Grace [name]

## 2011-11-20 NOTE — Anesthesia Postprocedure Evaluation (Signed)
  Anesthesia Post-op Note  Patient: Grace [name]  Procedure(s) Performed: Procedure(s) (LRB): THYROIDECTOMY (Left)  Patient Location: PACU  Anesthesia Type: General  Level of Consciousness: awake, alert  and oriented  Airway and Oxygen Therapy: Patient Spontanous Breathing and Patient connected to nasal cannula oxygen  Post-op Pain: none  Post-op Assessment: Post-op Vital signs reviewed, Patient's Cardiovascular Status Stable, Respiratory Function Stable, Patent Airway, No signs of Nausea or vomiting and Pain level controlled  Post-op Vital Signs: Reviewed and stable  Complications: No apparent anesthesia complications

## 2011-11-20 NOTE — Anesthesia Procedure Notes (Signed)
Procedure Name: Intubation Date/Time: 11/20/2011 9:26 AM Performed by: Elon Alas Pre-anesthesia Checklist: Patient identified, Emergency Drugs available, Timeout performed, Suction available and Patient being monitored Patient Re-evaluated:Patient Re-evaluated prior to inductionOxygen Delivery Method: Circle system utilized Preoxygenation: Pre-oxygenation with 100% oxygen Intubation Type: IV induction Ventilation: Mask ventilation without difficulty Laryngoscope Size: Mac and 3 Grade View: Grade I Tube type: Oral Tube size: 7.5 mm Number of attempts: 1 Airway Equipment and Method: Stylet and LTA kit utilized Placement Confirmation: positive ETCO2,  ETT inserted through vocal cords under direct vision and breath sounds checked- equal and bilateral Secured at: 21 cm Tube secured with: Tape Dental Injury: Teeth and Oropharynx as per pre-operative assessment

## 2011-11-20 NOTE — Anesthesia Preprocedure Evaluation (Addendum)
Anesthesia Evaluation  Patient identified by MRN, date of birth, ID band Patient awake    Reviewed: Allergy & Precautions, H&P , NPO status , Patient's Chart, lab work & pertinent test results  Airway Mallampati: II TM Distance: >3 FB Neck ROM: Full    Dental  (+) Caps, Implants and Dental Advisory Given   Pulmonary neg pulmonary ROS,  breath sounds clear to auscultation  Pulmonary exam normal       Cardiovascular negative cardio ROS  + Valvular Problems/Murmurs (h/o asymptomatic heart murmur, ECHO in past normal, as per patient) Rhythm:Regular Rate:Normal     Neuro/Psych TIA (transient speech difficulty, no sequelae)negative psych ROS   GI/Hepatic negative GI ROS, Neg liver ROS,   Endo/Other  Hypothyroidism (on replacement)   Renal/GU negative Renal ROS     Musculoskeletal   Abdominal   Peds  Hematology   Anesthesia Other Findings   Reproductive/Obstetrics                          Anesthesia Physical Anesthesia Plan  ASA: II  Anesthesia Plan: General   Post-op Pain Management:    Induction: Intravenous  Airway Management Planned: Oral ETT  Additional Equipment:   Intra-op Plan:   Post-operative Plan: Extubation in OR  Informed Consent: I have reviewed the patients History and Physical, chart, labs and discussed the procedure including the risks, benefits and alternatives for the proposed anesthesia with the patient or authorized representative who has indicated his/her understanding and acceptance.   Dental advisory given  Plan Discussed with: Anesthesiologist, Surgeon and CRNA  Anesthesia Plan Comments: (Plan routine monitors, GETA)       Anesthesia Quick Evaluation

## 2011-11-20 NOTE — Op Note (Signed)
OPERATIVE REPORT  DATE OF SURGERY: 11/20/2011  PATIENT:  Grace [name],  66 y.o. female  PRE-OPERATIVE DIAGNOSIS:  LEFT THYROID MASS  POST-OPERATIVE DIAGNOSIS:  LEFT THYROID MASS  PROCEDURE:  Procedure(s): THYROIDECTOMY  SURGEON:  Susy Frizzle, MD  ASSISTANTS: Aquilla Hacker, PA   ANESTHESIA:   general  EBL:  20 ml  DRAINS: 10 Fr JP in the neck   LOCAL MEDICATIONS USED:  OTHER Xylocaine with epinephrine  SPECIMEN:  Source of Specimen:  Left thyroid lobe  COUNTS:  YES  PROCEDURE DETAILS: Patient was taken to the operating room and placed on the operating table in the supine position. Following induction of general endotracheal anesthesia a shoulder roll was placed beneath the shoulder blades to extend the neck. The neck was prepped and draped in a standard fashion. A low transverse incision was outlined with a marking pen and infiltrated with local anesthetic solution. A #15 scalpel was used to incise the skin and subcutaneous tissue. Electrocautery was used to continue the incision down through the subcutaneous tissue and platysma level. Subplatysmal flaps were elevated superiorly to the thyroid notch and inferiorly to the clavicle. Gelpi retractors were used throughout the case. The midline fascia was divided. The diastases of the strap muscles was identified and divided. The left side strap muscles were reflected laterally and the left thyroid lobe was identified and retracted medially with Allis clamps. The superior vasculature was dissected initially. The dissection stayed right on the capsule of the gland. The superior vasculature was identified, ligated between clamps and divided. The middle thyroid vein was similarly treated. As the gland was brought forward the recurrent nerve was identified and preserved. The superior parathyroid was also identified and preserved with its blood supply. The inferior vasculature was ligated between clamps and divided. The gland was brought off  the trachea. Electrocautery was used to incise the ligament of Berry. The isthmus was divided to the right of midline. The gland was sent for frozen section analysis which was negative for papillary carcinoma. The wound was irrigated. Hemostasis was completed. 4-0 silk ties were used throughout the case. The midline fascia was reapproximated with 4-0 chromic suture. A 10 French round drain was left in the wound and exited to the right side of the incision and secured in place with a nylon suture. The platysma layer was reapproximated with chromic suture. Subcuticular closure was accomplished with 4-0 chromic suture and Dermabond was used on the skin. The patient was then awakened extubated and transferred to recovery in stable condition.   PATIENT DISPOSITION:  PACU - hemodynamically stable.

## 2011-11-20 NOTE — Preoperative (Signed)
Beta Blockers   Reason not to administer Beta Blockers:Not Applicable 

## 2011-11-20 NOTE — Transfer of Care (Signed)
Immediate Anesthesia Transfer of Care Note  Patient: Grace [name]  Procedure(s) Performed: Procedure(s) (LRB): THYROIDECTOMY (Left)  Patient Location: PACU  Anesthesia Type: General  Level of Consciousness: awake, alert  and oriented  Airway & Oxygen Therapy: Patient Spontanous Breathing and Patient connected to nasal cannula oxygen  Post-op Assessment: Report given to PACU RN, Post -op Vital signs reviewed and stable and Patient moving all extremities X 4  Post vital signs: Reviewed and stable  Complications: No apparent anesthesia complications

## 2011-11-21 ENCOUNTER — Encounter (HOSPITAL_COMMUNITY): Payer: Self-pay | Admitting: Otolaryngology

## 2011-11-21 NOTE — Discharge Summary (Signed)
  Physician Discharge Summary  Patient ID: Grace [name] MRN: [id number] DOB/AGE: 65/06/1945 65 y.o.  Admit date: 11/20/2011 Discharge date: 11/21/2011  Admission Diagnoses: Thyroid mass  Discharge Diagnoses:  Active Problems:  * No active hospital problems. *    Discharged Condition: good  Hospital Course: no complications  Consults: None  Significant Diagnostic Studies: none  Treatments: surgery: thyroid oobectomy  Discharge Exam: Blood pressure 100/46, pulse 61, temperature 98.1 F (36.7 C), temperature source Oral, resp. rate 16, height 5\' 2"  (1.575 m), weight 143 lb 1.3 oz (64.9 kg), SpO2 96.00%. PHYSICAL EXAM: Voice strong, neck excellent. JP removed.   Disposition: 01-Home or Self Care  Discharge Orders    Future Orders Please Complete By Expires   Diet - low sodium heart healthy      Increase activity slowly        Medication List  As of 11/21/2011  8:25 AM   TAKE these medications         ALIVE WOMENS 50+ Tabs   Take 1 tablet by mouth daily.      cycloSPORINE 0.05 % ophthalmic emulsion   Commonly known as: RESTASIS   Place 1 drop into both eyes 2 (two) times daily.      HYDROcodone-acetaminophen 7.5-500 MG per tablet   Commonly known as: LORTAB   Take 1 tablet by mouth every 6 (six) hours as needed for pain.      levothyroxine 25 MCG tablet   Commonly known as: SYNTHROID, LEVOTHROID   Take 25 mcg by mouth daily.      ondansetron 4 MG disintegrating tablet   Commonly known as: ZOFRAN-ODT   Take 2 tablets (8 mg total) by mouth every 8 (eight) hours as needed for nausea.      pravastatin 20 MG tablet   Commonly known as: PRAVACHOL   Take 20 mg by mouth daily.           Follow-up Information    Follow up with Serena Colonel, MD. Schedule an appointment as soon as possible for a visit in 1 week.   Contact information:   [address], [address] [place] [address] [phone]           Signed: Serena Colonel 11/21/2011, 8:25 AM

## 2012-02-19 ENCOUNTER — Emergency Department (HOSPITAL_COMMUNITY): Payer: Medicare Other

## 2012-02-19 ENCOUNTER — Observation Stay (HOSPITAL_COMMUNITY)
Admission: EM | Admit: 2012-02-19 | Discharge: 2012-02-20 | Disposition: A | Discharge status: Home / Self Care | Payer: Medicare Other | Attending: Internal Medicine | Admitting: Internal Medicine

## 2012-02-19 ENCOUNTER — Encounter (HOSPITAL_COMMUNITY): Payer: Self-pay | Admitting: *Deleted

## 2012-02-19 DIAGNOSIS — E7800 Pure hypercholesterolemia, unspecified: Secondary | ICD-10-CM

## 2012-02-19 DIAGNOSIS — E785 Hyperlipidemia, unspecified: Secondary | ICD-10-CM | POA: Insufficient documentation

## 2012-02-19 DIAGNOSIS — I5189 Other ill-defined heart diseases: Secondary | ICD-10-CM | POA: Diagnosis present

## 2012-02-19 DIAGNOSIS — H547 Unspecified visual loss: Secondary | ICD-10-CM | POA: Insufficient documentation

## 2012-02-19 DIAGNOSIS — Z7982 Long term (current) use of aspirin: Secondary | ICD-10-CM | POA: Insufficient documentation

## 2012-02-19 DIAGNOSIS — G459 Transient cerebral ischemic attack, unspecified: Secondary | ICD-10-CM

## 2012-02-19 DIAGNOSIS — I6529 Occlusion and stenosis of unspecified carotid artery: Secondary | ICD-10-CM | POA: Insufficient documentation

## 2012-02-19 DIAGNOSIS — R51 Headache: Secondary | ICD-10-CM | POA: Insufficient documentation

## 2012-02-19 DIAGNOSIS — R002 Palpitations: Secondary | ICD-10-CM | POA: Diagnosis present

## 2012-02-19 DIAGNOSIS — H539 Unspecified visual disturbance: Secondary | ICD-10-CM

## 2012-02-19 DIAGNOSIS — R4701 Aphasia: Principal | ICD-10-CM | POA: Insufficient documentation

## 2012-02-19 DIAGNOSIS — R011 Cardiac murmur, unspecified: Secondary | ICD-10-CM

## 2012-02-19 DIAGNOSIS — R471 Dysarthria and anarthria: Secondary | ICD-10-CM | POA: Insufficient documentation

## 2012-02-19 DIAGNOSIS — R001 Bradycardia, unspecified: Secondary | ICD-10-CM

## 2012-02-19 DIAGNOSIS — R42 Dizziness and giddiness: Secondary | ICD-10-CM | POA: Insufficient documentation

## 2012-02-19 DIAGNOSIS — R519 Headache, unspecified: Secondary | ICD-10-CM | POA: Diagnosis present

## 2012-02-19 DIAGNOSIS — E039 Hypothyroidism, unspecified: Secondary | ICD-10-CM | POA: Insufficient documentation

## 2012-02-19 HISTORY — DX: Transient cerebral ischemic attack, unspecified: G45.9

## 2012-02-19 HISTORY — DX: Other ill-defined heart diseases: I51.89

## 2012-02-19 LAB — COMPREHENSIVE METABOLIC PANEL
ALT: 20 U/L (ref 0–35)
AST: 24 U/L (ref 0–37)
Albumin: 3.9 g/dL (ref 3.5–5.2)
Alkaline Phosphatase: 79 U/L (ref 39–117)
BUN: 13 mg/dL (ref 6–23)
CO2: 29 mEq/L (ref 19–32)
Calcium: 9.7 mg/dL (ref 8.4–10.5)
Chloride: 100 mEq/L (ref 96–112)
Creatinine, Ser: 0.71 mg/dL (ref 0.50–1.10)
GFR calc Af Amer: >90 mL/min (ref >90)
GFR calc non Af Amer: 89 mL/min — ABNORMAL LOW (ref >90)
Glucose, Bld: 108 mg/dL — ABNORMAL HIGH (ref 70–99)
Potassium: 3.6 mEq/L (ref 3.5–5.1)
Sodium: 136 mEq/L (ref 135–145)
Total Bilirubin: 0.5 mg/dL (ref 0.3–1.2)
Total Protein: 7.5 g/dL (ref 6.0–8.3)

## 2012-02-19 LAB — CBC WITH DIFFERENTIAL/PLATELET
Basophils Absolute: 0.1 10*3/uL (ref 0.0–0.1)
Basophils Relative: 1 % (ref 0–1)
Eosinophils Absolute: 0.2 10*3/uL (ref 0.0–0.7)
Eosinophils Relative: 3 % (ref 0–5)
HCT: 40.4 % (ref 36.0–46.0)
Hemoglobin: 14.2 g/dL (ref 12.0–15.0)
Lymphocytes Relative: 34 % (ref 12–46)
Lymphs Abs: 2.3 10*3/uL (ref 0.7–4.0)
MCH: 30.9 pg (ref 26.0–34.0)
MCHC: 35.1 g/dL (ref 30.0–36.0)
MCV: 87.8 fL (ref 78.0–100.0)
Monocytes Absolute: 0.6 10*3/uL (ref 0.1–1.0)
Monocytes Relative: 9 % (ref 3–12)
Neutro Abs: 3.6 10*3/uL (ref 1.7–7.7)
Neutrophils Relative %: 53 % (ref 43–77)
Platelets: 182 10*3/uL (ref 150–400)
RBC: 4.6 MIL/uL (ref 3.87–5.11)
RDW: 12.7 % (ref 11.5–15.5)
WBC: 6.7 10*3/uL (ref 4.0–10.5)

## 2012-02-19 LAB — PROTIME-INR
INR: 0.93 (ref 0.00–1.49)
Prothrombin Time: 12.4 seconds (ref 11.6–15.2)

## 2012-02-19 LAB — APTT: aPTT: 33 seconds (ref 24–37)

## 2012-02-19 MED ORDER — METOCLOPRAMIDE HCL 5 MG/ML IJ SOLN
10.0000 mg | Freq: Once | INTRAMUSCULAR | Status: AC
  Administered 2012-02-19: 10 mg via INTRAVENOUS
  Filled 2012-02-19: qty 2

## 2012-02-19 MED ORDER — DIPHENHYDRAMINE HCL 50 MG/ML IJ SOLN
50.0000 mg | Freq: Once | INTRAMUSCULAR | Status: AC
  Administered 2012-02-19: 50 mg via INTRAVENOUS
  Filled 2012-02-19: qty 1

## 2012-02-19 MED ORDER — SODIUM CHLORIDE 0.9 % IV SOLN
INTRAVENOUS | Status: DC
  Administered 2012-02-19 – 2012-02-20 (×3): via INTRAVENOUS

## 2012-02-19 NOTE — ED Notes (Signed)
Reports onset of seeing shadows, slurred speech and incomprehensible speech per daughter, onset approx 30 min ago.  Pt and daughter report pt symptoms are now improved.  No slurred speech noted now, and pt states she is no longer seeing shadows.

## 2012-02-19 NOTE — ED Provider Notes (Addendum)
History  This chart was scribed for Ward Givens, MD by Manuela Schwartz, ED scribe. This patient was seen in room [facility] and the patient's care was started at 2022.  CSN: [id number]  Arrival date & time 02/19/12  2022   First MD Initiated Contact with Patient 02/19/12 2038      Chief Complaint  Patient presents with  . Dizziness  . Aphasia   The history is provided by the patient. No language interpreter was used.   Grace [name] is a 66 y.o. female who presents to the Emergency Department complaining of intermittent blurry vision and slurred speech this PM which began about 2.5 hours ago. She states was at home watching TV when she began having right sided visual field aura or "flashing lights". She states was talking to family member of the phone who noticed she was slurring her speech and having trouble saying what she wanted to say. Her symptoms reportedly would last around 15-30 minutes at a time and returned several times before arrival to ED and also felt somewhat off balance while walking today. She also complains of bilateral temporal HA which began along with visual aura. Yesterday both her hands felt a little numb and she felt nauseated. She states a similar previous episode about 6 months which she did not get a workup for and it resolved on its own in 30 minutes. She recently had surgery 3 days ago for basal cell skin cancer of her LLE, also had thyroid surgery in September. Her mother had a hx of TIAs.  Symptoms started about 7 pm and talked to her daughter about 7:30 pm. States she had trouble thinking what she wanted to say.   She denies emesis  Does not smoke or drink, lives alone, retired from work  PCP Dr. Lilyan Punt  Past Medical History  Diagnosis Date  . Hypothyroid   . Hyperlipidemia     Diet controlled  . Family history of anesthesia complication     Mother was hard to wake up after anesthesia  . Heart murmur   . Mitral valve prolapse   . Frequency of  urination   . Dry eyes, bilateral   . Cancer     Skin Cancer, Squamous Cell    Past Surgical History  Procedure Date  . Tubal ligation     38 yrs ago.  . Colonoscopy 12/18/2010    Procedure: COLONOSCOPY;  Surgeon: Malissa Hippo, MD;  Location: [facility];  Service: Endoscopy;  Laterality: N/A;  1:00 pm  . Breast biopsy     left benign tumor  . Cholecystectomy   . Thyroidectomy 11/20/2011    Procedure: THYROIDECTOMY;  Surgeon: Serena Colonel, MD;  Location: [facility] OR;  Service: ENT;  Laterality: Left;  LEFT THYROID LOBECTOMY    Family History  Problem Relation Age of Onset  . Heart disease Mother   . Hypertension Mother   MOP died at age 89, had TIA's or "mini strokes"  History  Substance Use Topics  . Smoking status: Never Smoker   . Smokeless tobacco: Not on file  . Alcohol Use: No  Lives at home Lives alone retired  OB History    Grav Para Term Preterm Abortions TAB SAB Ect Mult Living                  Review of Systems  Constitutional: Negative for fever and chills.  Respiratory: Negative for shortness of breath.   Gastrointestinal: Negative for nausea and  vomiting.  Neurological: Positive for speech difficulty (slurred speech, aphasic) and headaches. Negative for syncope, weakness and numbness.       Off balance today   All other systems reviewed and are negative.    Allergies  Compazine and Sulfa drugs cross reactors  Home Medications   Patient's Medications  New Prescriptions   No medications on file  Previous Medications   BISMUTH SUBSALICYLATE (PEPTO BISMOL) 262 MG/15ML SUSPENSION    Take 15 mLs by mouth once as needed. For upset stomach/ nausea   CYCLOSPORINE (RESTASIS) 0.05 % OPHTHALMIC EMULSION    Place 1 drop into both eyes 2 (two) times daily.   GLUCOSAMINE-CHONDROITIN PO    Take 2 tablets by mouth daily.   LEVOTHYROXINE (SYNTHROID, LEVOTHROID) 25 MCG TABLET    Take 25 mcg by mouth every morning.    MULTIPLE VITAMINS-MINERALS (ALIVE WOMENS 50+)  TABS    Take 1 tablet by mouth every evening.    PRAVASTATIN (PRAVACHOL) 20 MG TABLET    Take 20 mg by mouth every evening.   Modified Medications   No medications on file  Discontinued Medications   No medications on file     Triage Vitals: BP 151/74  Pulse 72  Temp 98 F (36.7 C)  Resp 20  Ht 5\' 3"  (1.6 m)  Wt 144 lb (65.318 kg)  BMI 25.51 kg/m2  SpO2 100%  Vital signs normal except hypertension  Physical Exam  Nursing note and vitals reviewed. Constitutional: She is oriented to person, place, and time. She appears well-developed and well-nourished.  Non-toxic appearance. She does not appear ill. No distress.  HENT:  Head: Normocephalic and atraumatic.  Right Ear: External ear normal.  Left Ear: External ear normal.  Nose: Nose normal. No mucosal edema or rhinorrhea.  Mouth/Throat: Oropharynx is clear and moist and mucous membranes are normal. No dental abscesses or uvula swelling.  Eyes: Conjunctivae normal and EOM are normal. Pupils are equal, round, and reactive to light.  Neck: Normal range of motion and full passive range of motion without pain. Neck supple.  Cardiovascular: Normal rate, regular rhythm and normal heart sounds.  Exam reveals no gallop and no friction rub.   No murmur heard. Pulmonary/Chest: Effort normal and breath sounds normal. No respiratory distress. She has no wheezes. She has no rhonchi. She has no rales. She exhibits no tenderness and no crepitus.  Abdominal: Soft. Normal appearance and bowel sounds are normal. She exhibits no distension. There is no tenderness. There is no rebound and no guarding.  Musculoskeletal: Normal range of motion. She exhibits no edema and no tenderness.       Moves all extremities well.   Neurological: She is alert and oriented to person, place, and time. She has normal strength. No cranial nerve deficit.       No visual field cuts  Skin: Skin is warm, dry and intact. No rash noted. No erythema. No pallor.  Psychiatric:  She has a normal mood and affect. Her speech is normal and behavior is normal. Her mood appears not anxious.    ED Course  Procedures (including critical care time)  Medications  0.9 %  sodium chloride infusion (  Intravenous New Bag/Given 02/19/12 2144)  metoCLOPramide (REGLAN) injection 10 mg (10 mg Intravenous Given 02/19/12 2147)  diphenhydrAMINE (BENADRYL) injection 50 mg (50 mg Intravenous Given 02/19/12 2146)   DIAGNOSTIC STUDIES: Oxygen Saturation is 100% on room air, normal by my interpretation.    COORDINATION OF CARE: At 935 PM  Discussed treatment plan with patient which includes blood work. Patient agrees.   22:45 headache is better with medications.  22:54 Dr Phillips Odor will come admit patient.      Results for orders placed during the hospital encounter of 02/19/12  COMPREHENSIVE METABOLIC PANEL      Component Value Range   Sodium 136  135 - 145 mEq/L   Potassium 3.6  3.5 - 5.1 mEq/L   Chloride 100  96 - 112 mEq/L   CO2 29  19 - 32 mEq/L   Glucose, Bld 108 (*) 70 - 99 mg/dL   BUN 13  6 - 23 mg/dL   Creatinine, Ser 1.61  0.50 - 1.10 mg/dL   Calcium 9.7  8.4 - 09.6 mg/dL   Total Protein 7.5  6.0 - 8.3 g/dL   Albumin 3.9  3.5 - 5.2 g/dL   AST 24  0 - 37 U/L   ALT 20  0 - 35 U/L   Alkaline Phosphatase 79  39 - 117 U/L   Total Bilirubin 0.5  0.3 - 1.2 mg/dL   GFR calc non Af Amer 89 (*) >90 mL/min   GFR calc Af Amer >90  >90 mL/min  CBC WITH DIFFERENTIAL      Component Value Range   WBC 6.7  4.0 - 10.5 K/uL   RBC 4.60  3.87 - 5.11 MIL/uL   Hemoglobin 14.2  12.0 - 15.0 g/dL   HCT 04.5  40.9 - 81.1 %   MCV 87.8  78.0 - 100.0 fL   MCH 30.9  26.0 - 34.0 pg   MCHC 35.1  30.0 - 36.0 g/dL   RDW 91.4  78.2 - 95.6 %   Platelets 182  150 - 400 K/uL   Neutrophils Relative 53  43 - 77 %   Neutro Abs 3.6  1.7 - 7.7 K/uL   Lymphocytes Relative 34  12 - 46 %   Lymphs Abs 2.3  0.7 - 4.0 K/uL   Monocytes Relative 9  3 - 12 %   Monocytes Absolute 0.6  0.1 - 1.0 K/uL    Eosinophils Relative 3  0 - 5 %   Eosinophils Absolute 0.2  0.0 - 0.7 K/uL   Basophils Relative 1  0 - 1 %   Basophils Absolute 0.1  0.0 - 0.1 K/uL  APTT      Component Value Range   aPTT 33  24 - 37 seconds  PROTIME-INR      Component Value Range   Prothrombin Time 12.4  11.6 - 15.2 seconds   INR 0.93  0.00 - 1.49   Laboratory interpretation all normal   Ct Head Wo Contrast  02/19/2012  *RADIOLOGY REPORT*  Clinical Data: Dizziness, blurred vision.  CT HEAD WITHOUT CONTRAST  Technique:  Contiguous axial images were obtained from the base of the skull through the vertex without contrast.  Comparison: 03/03/2003 brain MRI  Findings: Mild cerebellar volume loss is similar to prior. There is no evidence for acute hemorrhage, hydrocephalus, mass lesion, or abnormal extra-axial fluid collection.  No definite CT evidence for acute infarction.  The visualized paranasal sinuses and mastoid air cells are predominately clear.  IMPRESSION: No acute intracranial abnormality.   Original Report Authenticated By: Jearld Lesch, M.D.     Date: 02/19/2012  Rate: 100  Rhythm: sinus tachycardia  QRS Axis: left  Intervals: normal  ST/T Wave abnormalities: normal  Conduction Disutrbances:none  Narrative Interpretation: Q wave septal leads  Old EKG Reviewed: none  available    1. TIA (transient ischemic attack)    Plan admission  Devoria Albe, MD, FACEP  MDM  I personally performed the services described in this documentation, which was scribed in my presence. The recorded information has been reviewed and considered.  Devoria Albe, MD, FACEP          Ward Givens, MD 02/20/12 Burna Mortimer  Ward Givens, MD 02/20/12 0010

## 2012-02-20 ENCOUNTER — Observation Stay (HOSPITAL_COMMUNITY): Payer: Medicare Other

## 2012-02-20 ENCOUNTER — Encounter (HOSPITAL_COMMUNITY): Payer: Self-pay | Admitting: *Deleted

## 2012-02-20 DIAGNOSIS — E039 Hypothyroidism, unspecified: Secondary | ICD-10-CM

## 2012-02-20 DIAGNOSIS — I5189 Other ill-defined heart diseases: Secondary | ICD-10-CM

## 2012-02-20 DIAGNOSIS — H539 Unspecified visual disturbance: Secondary | ICD-10-CM | POA: Diagnosis present

## 2012-02-20 DIAGNOSIS — G459 Transient cerebral ischemic attack, unspecified: Secondary | ICD-10-CM

## 2012-02-20 DIAGNOSIS — E7800 Pure hypercholesterolemia, unspecified: Secondary | ICD-10-CM

## 2012-02-20 DIAGNOSIS — R4701 Aphasia: Principal | ICD-10-CM

## 2012-02-20 DIAGNOSIS — R001 Bradycardia, unspecified: Secondary | ICD-10-CM | POA: Diagnosis present

## 2012-02-20 DIAGNOSIS — R471 Dysarthria and anarthria: Secondary | ICD-10-CM

## 2012-02-20 HISTORY — DX: Other ill-defined heart diseases: I51.89

## 2012-02-20 LAB — LIPID PANEL
Cholesterol: 160 mg/dL (ref 0–200)
HDL: 35 mg/dL — ABNORMAL LOW (ref >39)
LDL Cholesterol: 101 mg/dL — ABNORMAL HIGH (ref 0–99)
Total CHOL/HDL Ratio: 4.6 RATIO
Triglycerides: 120 mg/dL (ref <150)
VLDL: 24 mg/dL (ref 0–40)

## 2012-02-20 LAB — URINALYSIS, ROUTINE W REFLEX MICROSCOPIC
Bilirubin Urine: NEGATIVE
Glucose, UA: NEGATIVE mg/dL
Hgb urine dipstick: NEGATIVE
Ketones, ur: NEGATIVE mg/dL
Nitrite: NEGATIVE
Protein, ur: NEGATIVE mg/dL
Specific Gravity, Urine: 1.01 (ref 1.005–1.030)
Urobilinogen, UA: 0.2 mg/dL (ref 0.0–1.0)
pH: 6 (ref 5.0–8.0)

## 2012-02-20 LAB — TSH: TSH: 5.329 u[IU]/mL — ABNORMAL HIGH (ref 0.350–4.500)

## 2012-02-20 LAB — MRSA PCR SCREENING: MRSA by PCR: NEGATIVE

## 2012-02-20 LAB — TROPONIN I: Troponin I: <0.3 ng/mL (ref <0.30)

## 2012-02-20 LAB — URINE MICROSCOPIC-ADD ON

## 2012-02-20 LAB — RAPID URINE DRUG SCREEN, HOSP PERFORMED
Amphetamines: NOT DETECTED
Barbiturates: NOT DETECTED
Benzodiazepines: NOT DETECTED
Cocaine: NOT DETECTED
Opiates: NOT DETECTED
Tetrahydrocannabinol: NOT DETECTED

## 2012-02-20 LAB — HEMOGLOBIN A1C
Hgb A1c MFr Bld: 5.1 % (ref <5.7)
Mean Plasma Glucose: 100 mg/dL (ref <117)

## 2012-02-20 MED ORDER — ASPIRIN 81 MG PO TBEC: 81.0000 mg | DELAYED_RELEASE_TABLET | Freq: Every day | ORAL | Status: DC

## 2012-02-20 MED ORDER — ALUM & MAG HYDROXIDE-SIMETH 200-200-20 MG/5ML PO SUSP: 30.0000 mL | ORAL | Status: DC | PRN

## 2012-02-20 MED ORDER — ONDANSETRON HCL 4 MG/2ML IJ SOLN: 4.0000 mg | Freq: Four times a day (QID) | INTRAMUSCULAR | Status: DC | PRN

## 2012-02-20 MED ORDER — ASPIRIN 325 MG PO TABS
325.0000 mg | ORAL_TABLET | Freq: Every day | ORAL | Status: DC
  Administered 2012-02-20: 325 mg via ORAL
  Filled 2012-02-20: qty 1

## 2012-02-20 MED ORDER — SIMVASTATIN 20 MG PO TABS: 10.0000 mg | ORAL_TABLET | Freq: Every day | ORAL | Status: DC

## 2012-02-20 MED ORDER — LEVOTHYROXINE SODIUM 25 MCG PO TABS
25.0000 ug | ORAL_TABLET | Freq: Every day | ORAL | Status: DC
  Administered 2012-02-20: 25 ug via ORAL
  Filled 2012-02-20: qty 1

## 2012-02-20 MED ORDER — CYCLOSPORINE 0.05 % OP EMUL
1.0000 [drp] | Freq: Two times a day (BID) | OPHTHALMIC | Status: DC
  Filled 2012-02-20 (×8): qty 1

## 2012-02-20 MED ORDER — ACETAMINOPHEN 325 MG PO TABS: 650.0000 mg | ORAL_TABLET | ORAL | Status: DC | PRN

## 2012-02-20 NOTE — Progress Notes (Signed)
Subjective: The patient has no complaints of headache, visual changes, difficulty speaking, or difficulty swallowing. She denies focal weakness. In  Objective: Vital signs in last 24 hours: Filed Vitals:   02/19/12 2307 02/20/12 0100 02/20/12 0602 02/20/12 0722  BP: 106/55 130/79  115/68  Pulse: 67 61  62  Temp:  98.4 F (36.9 C) 97.9 F (36.6 C) 98.1 F (36.7 C)  TempSrc:  Oral  Oral  Resp: 18 20  20   Height:      Weight:   68.4 kg (150 lb 12.7 oz)   SpO2: 95% 96%  96%    Intake/Output Summary (Last 24 hours) at 02/20/12 0801 Last data filed at 02/20/12 0131  Gross per 24 hour  Intake 378.33 ml  Output      0 ml  Net 378.33 ml    Weight change:   Physical exam: General: Pleasant 66 year old Caucasian woman laying in bed, in no acute distress. HEENT: Head is no cephalic, nontraumatic. Pupils are equal, round, and normoactive to light. Extraocular movements are intact. Lungs: Clear to auscultation bilaterally. Heart: S1, S2, with a 1-2/6 systolic murmur. Abdomen: Mildly obese, positive bowel sounds, soft, nontender, nondistended. Extremities: Pedal pulses palpable bilaterally. No pedal edema. Neurologic: The patient is alert and oriented x3. Cranial nerves II through XII are intact. No nystagmus. Negative Romberg. Cerebellar with figure to nose intact. Strength is 5 over 5 throughout. Sensation is grossly intact.  Lab Results: Basic Metabolic Panel:  [facility] 02/19/12 2121  NA 136  K 3.6  CL 100  CO2 29  GLUCOSE 108*  BUN 13  CREATININE 0.71  CALCIUM 9.7  MG --  PHOS --   Liver Function Tests:  [facility] 02/19/12 2121  AST 24  ALT 20  ALKPHOS 79  BILITOT 0.5  PROT 7.5  ALBUMIN 3.9   No results found for this basename: LIPASE:2,AMYLASE:2 in the last 72 hours No results found for this basename: AMMONIA:2 in the last 72 hours CBC:  Basename 02/19/12 2121  WBC 6.7  NEUTROABS 3.6  HGB 14.2  HCT 40.4  MCV 87.8  PLT 182   Cardiac Enzymes:  Basename  02/20/12 0048  CKTOTAL --  CKMB --  CKMBINDEX --  TROPONINI <0.30   BNP: No results found for this basename: PROBNP:3 in the last 72 hours D-Dimer: No results found for this basename: DDIMER:2 in the last 72 hours CBG: No results found for this basename: GLUCAP:6 in the last 72 hours Hemoglobin A1C: No results found for this basename: HGBA1C in the last 72 hours Fasting Lipid Panel:  Basename 02/20/12 0429  CHOL 160  HDL 35*  LDLCALC 101*  TRIG 120  CHOLHDL 4.6  LDLDIRECT --   Thyroid Function Tests: No results found for this basename: TSH,T4TOTAL,FREET4,T3FREE,THYROIDAB in the last 72 hours Anemia Panel: No results found for this basename: VITAMINB12,FOLATE,FERRITIN,TIBC,IRON,RETICCTPCT in the last 72 hours Coagulation:  Basename 02/19/12 2137  LABPROT 12.4  INR 0.93   Urine Drug Screen: Drugs of Abuse  No results found for this basename: labopia,  cocainscrnur,  labbenz,  amphetmu,  thcu,  labbarb    Alcohol Level: No results found for this basename: ETH:2 in the last 72 hours Urinalysis: No results found for this basename: COLORURINE:2,APPERANCEUR:2,LABSPEC:2,PHURINE:2,GLUCOSEU:2,HGBUR:2,BILIRUBINUR:2,KETONESUR:2,PROTEINUR:2,UROBILINOGEN:2,NITRITE:2,LEUKOCYTESUR:2 in the last 72 hours Misc. Labs: EKG: Sinus bradycardia with a heart rate of 59 beats per minute.  Micro: Recent Results (from the past 240 hour(s))  MRSA PCR SCREENING     Status: Normal   Collection Time   02/20/12  1:00 AM      Component Value Range Status Comment   MRSA by PCR NEGATIVE  NEGATIVE Final     Studies/Results: Ct Head Wo Contrast  02/19/2012  *RADIOLOGY REPORT*  Clinical Data: Dizziness, blurred vision.  CT HEAD WITHOUT CONTRAST  Technique:  Contiguous axial images were obtained from the base of the skull through the vertex without contrast.  Comparison: 03/03/2003 brain MRI  Findings: Mild cerebellar volume loss is similar to prior. There is no evidence for acute hemorrhage,  hydrocephalus, mass lesion, or abnormal extra-axial fluid collection.  No definite CT evidence for acute infarction.  The visualized paranasal sinuses and mastoid air cells are predominately clear.  IMPRESSION: No acute intracranial abnormality.   Original Report Authenticated By: Jearld Lesch, M.D.     Medications:  Scheduled:   . aspirin  325 mg Oral Daily  . cycloSPORINE  1 drop Both Eyes BID  . [COMPLETED] diphenhydrAMINE  50 mg Intravenous Once  . levothyroxine  25 mcg Oral QAC breakfast  . [COMPLETED] metoCLOPramide  10 mg Intravenous Once  . simvastatin  10 mg Oral q1800   Continuous:   . sodium chloride 100 mL/hr at 02/20/12 0750   ZOX:[id number], alum & mag hydroxide-simeth, ondansetron (ZOFRAN) IV  Assessment: Principal Problem:  *TIA (transient ischemic attack) Active Problems:  Hypothyroidism  Hypercholesterolemia  Palpitations  Headache  Heart murmur  Aphasia  Dysarthria  Sinus bradycardia  Visual disturbance   The patient is a 66 year old who presented with difficulty speaking and visual changes yesterday. Her symptoms have completely resolved which gives way to a possibility that she could have had a TIA. Her laboratory studies are unremarkable with exception of a mildly elevated LDL. We'll continue pravastatin at the home dose, but will consider increasing the dose if the MRI of her brain or carotid ultrasound or 2-D echocardiogram results necessitate increasing the dose. She has a history of mitral valve prolapse. A 2-D echocardiogram hopefully will assess this. TSH is pending.  Plan:  1. As above. 2. We'll check the results of the brain MRI, carotid ultrasound, 2-D echocardiogram when available. If the results are unremarkable and the patient remains asymptomatic, would discharge to home today. We'll continue aspirin and other medications as ordered. 3. The nursing staff was asked to ambulate the patient.   LOS: 1 day   Karcyn Menn 02/20/2012,  8:01 AM

## 2012-02-20 NOTE — Progress Notes (Signed)
Dr Sherrie Mustache IN TO SEE PT.RN THEN GAVE PT HER DISCHARGE INSTRUCTIONS AND INFORMATION ON TIA'S AND STROKE PREVENTION. IV D/C'D. TELE MONITOR REMOVED. PT'S SPEECH IS CLEAR. FACE SYMETRICAL. NORMAL GRIP AND STRENGTH IN ALL FOUR EXTREMITIES. ALERT AND ORIENTED. DISCHARGED HOME VIA W/C ACCOMPANIED BY FAMILY MEMBERS.

## 2012-02-20 NOTE — Care Management Note (Signed)
    Page 1 of 1   02/20/2012     3:53:20 PM   CARE MANAGEMENT NOTE 02/20/2012  Patient:  Grace [name], Grace [name]   Account Number:  [id number]  Date Initiated:  02/20/2012  Documentation initiated by:  Rosemary Holms  Subjective/Objective Assessment:   Pt admitted from home where she lives alone. Will return home. No HH needs identified.     Action/Plan:   Anticipated DC Date:  02/21/2012   Anticipated DC Plan:  HOME/SELF CARE      DC Planning Services  CM consult      Choice offered to / List presented to:             Status of service:  Completed, signed off Medicare Important Message given?   (If response is "NO", the following Medicare IM given date fields will be blank) Date Medicare IM given:   Date Additional Medicare IM given:    Discharge Disposition:    Per UR Regulation:    If discussed at Long Length of Stay Meetings, dates discussed:    Comments:  02/20/12 Rosemary Holms RN BSN CM

## 2012-02-20 NOTE — Progress Notes (Signed)
*  PRELIMINARY RESULTS* Echocardiogram 2D Echocardiogram has been performed.  Conrad  02/20/2012, 1:57 PM

## 2012-02-20 NOTE — Discharge Summary (Signed)
Physician Discharge Summary  Grace [name] RUE:[id number] DOB: 1945/10/02 DOA: 02/19/2012  PCP: Lilyan Punt, MD  Admit date: 02/19/2012 Discharge date: 02/20/2012  Time spent: Greater than 30 minutes  Recommendations for Outpatient Follow-up:  1. The patient was instructed to followup with her primary care physician Dr. Gerda Diss in 3-5 days. 2. She was instructed not to drive for 5 days. 3. She was advised to stay hydrated and to avoid dehydration. 4. Consider changing statin to Lipitor, Crestor, or Zocor, pending patient's insurance coverage.  Discharge Diagnoses:  1. Transient expressive aphasia, dysarthria, transient headache, and visual disturbance, likely secondary to TIA. MRI of the brain negative for acute infarction. 2. 75-90% stenosis of the left ICA cavernous/supraclinoid junction, per MRA of the brain. 3. Mild hyperlipidemia. The patient's total cholesterol is 160, triglycerides 120, HDL cholesterol 35, and LDL cholesterol 101. 4. Hypothyroidism. The patient's TSH was slightly elevated at 5.3, but the dose of her home dose of levothyroxine was recently increased. Followup TSH and free T4 recommended in 6-8 weeks. 5. Mild diastolic dysfunction per 2-D echocardiogram. Ejection fraction 75%. 6. History of mitral valve prolapse, not evident on 2-D echocardiogram during this hospitalization.  Discharge Condition: Improved and stable.  Diet recommendation: Heart healthy.  Filed Weights   02/19/12 2036 02/20/12 0602  Weight: 65.318 kg (144 lb) 68.4 kg (150 lb 12.7 oz)    History of present illness:  The patient is a 66 year old woman with a history significant for hypothyroidism, hyperlipidemia, and mitral valve prolapse, who presented to the emergency department on 02/19/2012 with a chief complaint of difficulty speaking and visual changes. Chest similar episode 6 months ago but it was shorter and less persistent, and therefore, she did not seek evaluation. In the emergency  department, she was afebrile and hemodynamically stable. Her lab data were unremarkable. CT scan of her head revealed no evidence for acute infarction. Her EKG revealed normal sinus rhythm with a heart rate of 100 beats per minute and no ST or T wave abnormalities. She was admitted for further evaluation and management.  Hospital Course:  The patient was started on aspirin at 325 mg daily. She had not been on aspirin therapy in more than 6 months to one year, because she was told by her surgeon that she was "a free bleeder". She was continued on all of her chronic medications. For further evaluation, a number of studies were ordered. Her urine drug screen was negative. Her hemoglobin A1c was 5.1. Her troponin I was less than 0.30. The results of her fasting lipid profile were dictated above. Her TSH was slightly elevated at 5.3. Increasing the dose of levothyroxine was contemplated, however, the patient reported that the dose was recently increased from 25 mcg to 50 mcg in late September by either her thyroid surgeon Dr. Pollyann Kennedy or her endocrinologist Dr. Lurene Shadow. Therefore no changes were made, but the patient will need to have her TSH and free T4 reassessed in one to 2 months. Her urinalysis was unremarkable. Her carotid ultrasound revealed no significant ICA stenosis. MRI of her brain revealed no evidence of acute infarction. Her 2-D echocardiogram revealed no mitral valve prolapse and no PFO as discussed with  cardiologist Dr. Diona Browner), mild diastolic dysfunction, and a hyperdynamic left ventricle with an ejection fraction of 75%.   The patient became completely asymptomatic and remained asymptomatic throughout the hospitalization. She had no complaints of headache, dizziness, blurred vision, difficulty speaking, or difficulty swallowing. She ambulated in the room and with the nursing staff without  any difficulty.  After the patient was discharged, it was realized that the MRA of her brain revealed a  75-90% stenosis of the left ICA cavernous/supraclinoid junction. I discussed this finding with the radiologist on call, but not the radiologist who initially read the MRA. He suggested that if the patient preferred further testing or if she became more symptomatic, and angiography could be performed for a definitive diagnosis or to measure the extent of the potential stenosis as MRAs tend to over read stenosis. I discussed the findings with the patient over the telephone shortly after discharge. I continued to instruct her to take aspirin daily. I recommended that the dose of pravastatin could be increased or rather that her statin be changed to either Lipitor or Zocor. However, she was not sure if her insurance covered either. I informed her that an angiography could be performed to better tell the extent of the stenosis if she desired, but if there was a significant stenosis, then a decision will have to be made regarding potential angioplasty/stenting versus medical treatment. I informed her that I would discuss the findings with her primary care physician and at that time, appropriate referrals can be made if so desired. She voiced understanding.       Procedures:  2-D echocardiogram: Study Conclusions  - Left ventricle: The cavity size was normal. Wall thickness was normal. Systolic function was hyperdynamic. The estimated ejection fraction was in the range of 75% to 80%. Wall motion was normal; there were no regional wall motion abnormalities. Features are consistent with a pseudonormal left ventricular filling pattern, with concomitant abnormal relaxation and increased filling pressure (grade 2 diastolic dysfunction). - Aortic valve: Trivial regurgitation. - Atrial septum: No defect or patent foramen ovale was identified based on limited evaluation. - Tricuspid valve: Trivial regurgitation. - Pericardium, extracardiac: There was no pericardial effusion. Transthoracic echocardiography.  M-mode, complete 2D, spectral Doppler, and color Doppler. Height: Height: 160cm. Height: 63in. Weight: Weight: 68kg. Weight: 149.7lb. Body mass index: BMI: 26.6kg/m^2. Body surface area: BSA: 1.87m^2. Patient status: Inpatient. Location: ICU/CCU    Consultations:  None  Discharge Exam: Filed Vitals:   02/20/12 0602 02/20/12 0722 02/20/12 1120 02/20/12 1616  BP:  115/68 120/69 112/72  Pulse:  62 65 70  Temp: 97.9 F (36.6 C) 98.1 F (36.7 C) 98.7 F (37.1 C) 98.8 F (37.1 C)  TempSrc:  Oral Oral Oral  Resp:  20 20 20   Height:      Weight: 68.4 kg (150 lb 12.7 oz)     SpO2:  96% 96% 94%    See previous exam; unchanged.  Discharge Instructions  Discharge Orders    Future Orders Please Complete By Expires   Diet - low sodium heart healthy      Increase activity slowly      Discharge instructions      Comments:   No driving for 5 days. Stay hydrated.   Driving Restrictions      Comments:   No driving for 5 days.       Medication List     As of 02/20/2012  7:09 PM    TAKE these medications         ALIVE WOMENS 50+ Tabs   Take 1 tablet by mouth every evening.      aspirin 81 MG EC tablet   Take 1 tablet (81 mg total) by mouth daily. Swallow whole.      bismuth subsalicylate 262 MG/15ML suspension   Commonly known as: PEPTO  BISMOL   Take 15 mLs by mouth once as needed. For upset stomach/ nausea      cycloSPORINE 0.05 % ophthalmic emulsion   Commonly known as: RESTASIS   Place 1 drop into both eyes 2 (two) times daily.      GLUCOSAMINE-CHONDROITIN PO   Take 2 tablets by mouth daily.      levothyroxine 50 MCG tablet   Commonly known as: SYNTHROID, LEVOTHROID   Take 50 mcg by mouth daily.      pravastatin 20 MG tablet   Commonly known as: PRAVACHOL   Take 20 mg by mouth every evening.           Follow-up Information    Follow up with LUKING,SCOTT, MD. Schedule an appointment as soon as possible for a visit in 4 days.   Contact information:    [address] [place] [address] [phone]           The results of significant diagnostics from this hospitalization (including imaging, microbiology, ancillary and laboratory) are listed below for reference.    Significant Diagnostic Studies: Ct Head Wo Contrast  02/19/2012  *RADIOLOGY REPORT*  Clinical Data: Dizziness, blurred vision.  CT HEAD WITHOUT CONTRAST  Technique:  Contiguous axial images were obtained from the base of the skull through the vertex without contrast.  Comparison: 03/03/2003 brain MRI  Findings: Mild cerebellar volume loss is similar to prior. There is no evidence for acute hemorrhage, hydrocephalus, mass lesion, or abnormal extra-axial fluid collection.  No definite CT evidence for acute infarction.  The visualized paranasal sinuses and mastoid air cells are predominately clear.  IMPRESSION: No acute intracranial abnormality.   Original Report Authenticated By: Jearld Lesch, M.D.    Mri Brain Without Contrast  02/20/2012  *RADIOLOGY REPORT*  Clinical Data: Dizziness and blurred vision.  MRI HEAD WITHOUT CONTRAST  Technique:  Multiplanar, multiecho pulse sequences of the brain and surrounding structures were obtained according to standard protocol without intravenous contrast.  Comparison: 08/13/2008.  Findings: There is no evidence for acute infarction, intracranial hemorrhage, mass lesion, hydrocephalus, or extra-axial fluid. There is no cerebral or cerebellar atrophy.  There is minimal white matter disease. Slightly prominent perivascular spaces.  Tiny right thalamic lacune. Flow voids are maintained in the major intracranial vessels.  No pituitary or cerebellar tonsillar abnormality.  Negative osseous structures.  Extracranial soft tissues unremarkable.  No acute sinus or mastoid disease.  IMPRESSION: No acute or focal intracranial abnormality.  Minimal white matter disease is stable from 2010.   Original Report Authenticated By: Davonna Belling, M.D.    US  Carotid Duplex Bilateral  02/20/2012  *RADIOLOGY REPORT*  Clinical Data: Possible TIA  BILATERAL CAROTID DUPLEX ULTRASOUND  Technique: Gray scale imaging, color Doppler and duplex ultrasound was performed of bilateral carotid and vertebral arteries in the neck.  Comparison:  MRI of the brain 02/20/2012; prior carotid Doppler ultrasound 08/03/2008  Criteria:  Quantification of carotid stenosis is based on velocity parameters that correlate the residual internal carotid diameter with NASCET-based stenosis levels, using the diameter of the distal internal carotid lumen as the denominator for stenosis measurement.  The following velocity measurements were obtained:                   PEAK SYSTOLIC/END DIASTOLIC RIGHT ICA:                        118/30cm/sec CCA:  78/12cm/sec SYSTOLIC ICA/CCA RATIO:     1.5 DIASTOLIC ICA/CCA RATIO:    2.4 ECA:                        86cm/sec  LEFT ICA:                        83/23cm/sec CCA:                        89/17cm/sec SYSTOLIC ICA/CCA RATIO:     0.9 DIASTOLIC ICA/CCA RATIO:    1.4 ECA:                        61cm/sec  Findings:  RIGHT CAROTID ARTERY: Interval progression of mild intimal medial thickening to smooth slightly eccentric noncalcified atherosclerotic plaque in the distal common carotid artery and carotid bifurcation resulting in less than 50% diameter stenosis by subjective gray scale imaging and peak systolic velocity analysis. No significant spectral broadening on spectral Doppler analysis. No significant plaque in the internal carotid artery.  RIGHT VERTEBRAL ARTERY:  Patent with normal antegrade flow.  LEFT CAROTID ARTERY: Mild intimal medial thickening without significant focal atherosclerotic plaque.  No evidence of stenosis by gray scale, color Doppler or spectral Doppler analysis.  LEFT VERTEBRAL ARTERY:  Patent with normal antegrade flow  IMPRESSION:  1. Interval development of mild, smooth circumferential but slightly eccentric  noncalcified plaque in the right distal common carotid artery and carotid bulb compared to 08/03/2008.  The plaque results in less than 50% diameter stenosis.  2.  No significant plaque formation or stenosis the left carotid artery, unchanged from prior.  3.  Similar appearance of tortuous carotid arterial systems, slightly more notable on the right than the left.  Signed,  Sterling Big, MD Vascular & Interventional Radiologist [facility] Radiology   Original Report Authenticated By: Malachy Moan, M.D.    Mr Mra Head/brain Wo Cm  02/20/2012  *RADIOLOGY REPORT*  Clinical Data: Transit visual loss with aphasia.  History of hypertension.  MRA HEAD WITHOUT CONTRAST  Technique: Angiographic images of the Circle of Willis were obtained using MRA technique without intravenous contrast.  Comparison: None.  Findings: Right carotid widely patent.  75-90% stenosis at the junction of the cavernous/supraclinoid ICA on the left.  ICA terminus widely patent.  Basilar artery widely patent with vertebrals codominant.  50-75% stenosis A1 segment left anterior cerebral.  Right ACA widely patent.  No proximal MCA stenosis or irregularity.  Mild irregularity M2 and M3 middle cerebral artery vessels bilaterally.  No proximal PCA stenosis.  No cerebellar branch occlusion.  IMPRESSION: 75-90% of stenosis left ICA cavernous/supraclinoid junction.  Mild intracranial atherosclerotic changes as described without proximal MCA lesion.   Original Report Authenticated By: Davonna Belling, M.D.     Microbiology: Recent Results (from the past 240 hour(s))  MRSA PCR SCREENING     Status: Normal   Collection Time   02/20/12  1:00 AM      Component Value Range Status Comment   MRSA by PCR NEGATIVE  NEGATIVE Final      Labs: Basic Metabolic Panel:  Lab 02/19/12 1610  NA 136  K 3.6  CL 100  CO2 29  GLUCOSE 108*  BUN 13  CREATININE 0.71  CALCIUM 9.7  MG --  PHOS --   Liver Function Tests:  Lab 02/19/12 2121  AST 24   ALT 20  ALKPHOS 79  BILITOT 0.5  PROT 7.5  ALBUMIN 3.9   No results found for this basename: LIPASE:5,AMYLASE:5 in the last 168 hours No results found for this basename: AMMONIA:5 in the last 168 hours CBC:  Lab 02/19/12 2121  WBC 6.7  NEUTROABS 3.6  HGB 14.2  HCT 40.4  MCV 87.8  PLT 182   Cardiac Enzymes:  Lab 02/20/12 0048  CKTOTAL --  CKMB --  CKMBINDEX --  TROPONINI <0.30   BNP: BNP (last 3 results) No results found for this basename: PROBNP:3 in the last 8760 hours CBG: No results found for this basename: GLUCAP:5 in the last 168 hours     Signed:  Anthonella Klausner  [facility] 02/20/2012, 7:09 PM

## 2012-02-20 NOTE — Progress Notes (Signed)
UR Chart Review Completed  

## 2012-02-20 NOTE — Progress Notes (Signed)
PT UP AMBULATING IN ROOM AWAITING DISCHARGE ORDERS.SPEECH CLEAR. NO WEAKNESS. FAMILY MEMBERS AT BEDSIDE.

## 2012-02-20 NOTE — H&P (Signed)
[facility] History and Physical  Grace [name] ZOX:[id number] DOB: [birth year] DOA: 02/19/2012  Referring physician: Weber Cooks PCP: Lilyan Punt, MD  Specialists: none  Chief Complaint: Transient aphasia, visual disturbance  HPI: Grace [name] is a 66 y.o. female with a past medical history significant for hypothyroidism, hyperlipidemia and mitral valve prolapse who presented to [facility] ED complaining of acute onset difficulty speaking and visual changes than began around 6PM today. She was at home watching TV when she started having flashes of light/distortions in her vision in her right eye periphery, this was followed by slurred speech and almost complete aphasia "could not get my words out". She also felt unsteady on her feet but no specific lateral weakness. Throughout the day she had not been feeling well-generally weaker than usual, had a headache and low level nausea. The episode of speech loss and vision changes lasted about 15-30 minutes and resolved on its own, it reoccured 2-3 times before she came to ED for evaluation. She had a similar episode about 6 months ago but it was shorter and less persistent so she did not seek evaluation. She has not had an episode since being in the ED, but her mild headache and slight nausea persist. She reports being in general very healthy and active except for some arthritis in her knee, and recent outpatient procedure/office visit where she had a squamous cell skin cancer removed on her leg. She denies any chest pain or syncope. Does have episodes of palpitations frequently which have never been symptomatic or evaluated fully. No hypertension. She has a family history of strokes in her mother who lived to be 67. She does not smoke or use illicit drugs. No history of migraine headaches or recent stressors/trauma.  CT of her head in the ED was normal and neurological exam including visual fields was normal. Observation admission for TIA  requested.  Review of Systems:  Review of Systems  Constitutional: Negative.   HENT: Negative for hearing loss, congestion, sore throat and neck pain.   Eyes: Positive for blurred vision.       Scotomas, visual distortion, transient  Respiratory: Negative for cough and shortness of breath.   Cardiovascular: Positive for palpitations. Negative for chest pain, orthopnea, claudication and leg swelling.  Gastrointestinal: Positive for nausea. Negative for heartburn, vomiting, abdominal pain, diarrhea and constipation.  Genitourinary: Positive for frequency.  Musculoskeletal: Positive for joint pain. Negative for myalgias, back pain and falls.  Skin: Negative.   Neurological: Positive for dizziness, sensory change, speech change and headaches.  Endo/Heme/Allergies: Negative.   Psychiatric/Behavioral: Negative for depression, memory loss and substance abuse. The patient is not nervous/anxious.     Past Medical History  Diagnosis Date  . Hypothyroid   . Hyperlipidemia     Diet controlled  . Family history of anesthesia complication     Mother was hard to wake up after anesthesia  . Heart murmur   . Mitral valve prolapse   . Frequency of urination   . Dry eyes, bilateral   . Cancer     Skin Cancer, Squamous Cell   Past Surgical History  Procedure Date  . Tubal ligation     38 yrs ago.  . Colonoscopy 12/18/2010    Procedure: COLONOSCOPY;  Surgeon: Malissa Hippo, MD;  [facility];  Service: Endoscopy;  Laterality: N/A;  1:00 pm  . Breast biopsy     left benign tumor  . Cholecystectomy   . Thyroidectomy 11/20/2011    Procedure:  THYROIDECTOMY;  Surgeon: Serena Colonel, MD;  Location: [facility] OR;  Service: ENT;  Laterality: Left;  LEFT THYROID LOBECTOMY   Social History:  reports that she has never smoked. She does not have any smokeless tobacco history on file. She reports that she does not drink alcohol or use illicit drugs. Lives at home independently.  Allergies  Allergen  Reactions  . Compazine     Slurred speech, unsure of other reaction  . Sulfa Drugs Cross Reactors     rash    Family History  Problem Relation Age of Onset  . Heart disease Mother   . Hypertension Mother     Prior to Admission medications   Medication Sig Start Date End Date Taking? Authorizing Provider  bismuth subsalicylate (PEPTO BISMOL) 262 MG/15ML suspension Take 15 mLs by mouth once as needed. For upset stomach/ nausea   Yes Historical Provider, MD  cycloSPORINE (RESTASIS) 0.05 % ophthalmic emulsion Place 1 drop into both eyes 2 (two) times daily.   Yes Historical Provider, MD  GLUCOSAMINE-CHONDROITIN PO Take 2 tablets by mouth daily.   Yes Historical Provider, MD  levothyroxine (SYNTHROID, LEVOTHROID) 25 MCG tablet Take 25 mcg by mouth every morning.  10/31/10  Yes Historical Provider, MD  Multiple Vitamins-Minerals (ALIVE WOMENS 50+) TABS Take 1 tablet by mouth every evening.    Yes Historical Provider, MD  pravastatin (PRAVACHOL) 20 MG tablet Take 20 mg by mouth every evening.    Yes Historical Provider, MD   Physical Exam: Filed Vitals:   02/19/12 2036 02/19/12 2307 02/20/12 0100  BP: 151/74 106/55 130/79  Pulse: 72 67 61  Temp: 98 F (36.7 C)  98.4 F (36.9 C)  TempSrc:   Oral  Resp: 20 18 20   Height: 5\' 3"  (1.6 m)    Weight: 65.318 kg (144 lb)    SpO2: 100% 95% 96%     General:  Well appearing, very healthy, appears younger than stated age    Eyes: conjunctive red and inflammed  ENT: normal  Neck: normal no masses  Cardiovascular: RRR, S1, S2- SEM 2/6 holosystolic-louder over RSB, no click  Respiratory: normal CTAB  Abdomen: normal CTAB  Skin: Left leg has 2 cm inision with 5 stitches, no redness or drainage, covered with gauze.  Musculoskeletal: no joint swelling, normal bul and tone, no focal weakness  Psychiatric: normal  Neurologic: non-focal exam, visual fields normal Labs on Admission:  Basic Metabolic Panel:  Lab 02/19/12 3086  NA 136   K 3.6  CL 100  CO2 29  GLUCOSE 108*  BUN 13  CREATININE 0.71  CALCIUM 9.7  MG --  PHOS --   Liver Function Tests:  Lab 02/19/12 2121  AST 24  ALT 20  ALKPHOS 79  BILITOT 0.5  PROT 7.5  ALBUMIN 3.9   Lab 02/19/12 2121  WBC 6.7  NEUTROABS 3.6  HGB 14.2  HCT 40.4  MCV 87.8  PLT 182   Cardiac Enzymes:  Lab 02/20/12 0048  CKTOTAL --  CKMB --  CKMBINDEX --  TROPONINI <0.30    Radiological Exams on Admission: Ct Head Wo Contrast  02/19/2012  *RADIOLOGY REPORT*  Clinical Data: Dizziness, blurred vision.  CT HEAD WITHOUT CONTRAST  Technique:  Contiguous axial images were obtained from the base of the skull through the vertex without contrast.  Comparison: 03/03/2003 brain MRI  Findings: Mild cerebellar volume loss is similar to prior. There is no evidence for acute hemorrhage, hydrocephalus, mass lesion, or abnormal extra-axial fluid collection.  No  definite CT evidence for acute infarction.  The visualized paranasal sinuses and mastoid air cells are predominately clear.  IMPRESSION: No acute intracranial abnormality.   Original Report Authenticated By: Jearld Lesch, M.D.     EKG: NSR, left axis, no priors  Assessment/Plan Principal Problem:  *TIA (transient ischemic attack) Active Problems:  Hypothyroidism  Hypercholesterolemia  Palpitations  Headache  Heart murmur   1. Possible TIA vs. Atypical Migraine,  History concerning for TIA, but she has minimal risk factors, I am somewhat concerned about her palpitations and heart murmur which may actually be a PFO vs. Mitral Valve prolapse. Her diagnosis of MVP as many years ago and she was unclear on details. Diagnosis may have been presumtive in setting of her murmur. Either may potentially put her at risk for embolic strokes related to arrhythmias or PFO. Her HA and nausea with scotoma lean more towards migraine, but would be unusual to develop migraines at her age.   Admitted for Observation  Will monitor tele  and AM EKG for arrhythmias  Check MRI, 2D echo (may need bubble study), Dopplers  FLP in AM, she is on pravastatin but doesn't think her lipids were seriously out of control.  Started on ASA full dose  Education provided.  Tylenol for HA.  2. Hyperlipidemia  FLP in AM  Resumed Pravastatin  3. Hypothyroidism, she is on replacement synthroid, does not remember last time level was checked  Check TSH   Code Status: Full Code Family Communication: Discussed with patient, her daughter , and friend at bedside Disposition Plan: D/C tomorrow after TIA work-up complete  Time spent: 60 minutes  [facility] Pager [phone]   If 7PM-7AM, please contact night-coverage [email or web address] Password [email or web address] 02/20/2012, 1:55 AM

## 2012-02-20 NOTE — Progress Notes (Signed)
PT HAS GONE TO XRAY FOR MRI OF HEAD AND BILATERAL CAROTID DOPPLER STUDY. DAUGHTER AND PT'S FRIEND AT BEDSIDE.

## 2012-02-25 ENCOUNTER — Other Ambulatory Visit: Payer: Self-pay | Admitting: Family Medicine

## 2012-02-25 DIAGNOSIS — I6529 Occlusion and stenosis of unspecified carotid artery: Secondary | ICD-10-CM

## 2012-03-01 ENCOUNTER — Ambulatory Visit (HOSPITAL_COMMUNITY): Admission: RE | Admit: 2012-03-01 | Payer: BC Managed Care – PPO | Source: Ambulatory Visit

## 2012-03-02 ENCOUNTER — Other Ambulatory Visit (HOSPITAL_COMMUNITY): Payer: Self-pay | Admitting: Interventional Radiology

## 2012-03-02 ENCOUNTER — Ambulatory Visit (HOSPITAL_COMMUNITY)
Admission: RE | Admit: 2012-03-02 | Discharge: 2012-03-02 | Disposition: A | Discharge status: Home / Self Care | Payer: Medicare Other | Source: Ambulatory Visit | Attending: Family Medicine | Admitting: Family Medicine

## 2012-03-02 ENCOUNTER — Ambulatory Visit (HOSPITAL_COMMUNITY): Admission: RE | Admit: 2012-03-02 | Payer: BC Managed Care – PPO | Source: Ambulatory Visit

## 2012-03-02 DIAGNOSIS — I6529 Occlusion and stenosis of unspecified carotid artery: Secondary | ICD-10-CM

## 2012-03-04 ENCOUNTER — Other Ambulatory Visit: Payer: Self-pay | Admitting: Physician Assistant

## 2012-03-04 ENCOUNTER — Encounter (HOSPITAL_COMMUNITY): Payer: Self-pay | Admitting: Pharmacist

## 2012-03-08 ENCOUNTER — Encounter (HOSPITAL_COMMUNITY): Payer: Self-pay

## 2012-03-08 ENCOUNTER — Ambulatory Visit (HOSPITAL_COMMUNITY): Admission: RE | Admit: 2012-03-08 | Payer: Medicare Other | Source: Ambulatory Visit

## 2012-03-08 ENCOUNTER — Other Ambulatory Visit (HOSPITAL_COMMUNITY): Payer: Self-pay | Admitting: Interventional Radiology

## 2012-03-08 ENCOUNTER — Ambulatory Visit (HOSPITAL_COMMUNITY)
Admission: RE | Admit: 2012-03-08 | Discharge: 2012-03-08 | Disposition: A | Discharge status: Home / Self Care | Payer: Medicare Other | Source: Ambulatory Visit | Attending: Interventional Radiology | Admitting: Interventional Radiology

## 2012-03-08 DIAGNOSIS — R209 Unspecified disturbances of skin sensation: Secondary | ICD-10-CM | POA: Insufficient documentation

## 2012-03-08 DIAGNOSIS — R51 Headache: Secondary | ICD-10-CM | POA: Insufficient documentation

## 2012-03-08 DIAGNOSIS — I6529 Occlusion and stenosis of unspecified carotid artery: Secondary | ICD-10-CM

## 2012-03-08 DIAGNOSIS — H538 Other visual disturbances: Secondary | ICD-10-CM | POA: Insufficient documentation

## 2012-03-08 DIAGNOSIS — R9409 Abnormal results of other function studies of central nervous system: Secondary | ICD-10-CM | POA: Insufficient documentation

## 2012-03-08 DIAGNOSIS — I059 Rheumatic mitral valve disease, unspecified: Secondary | ICD-10-CM | POA: Insufficient documentation

## 2012-03-08 DIAGNOSIS — Z8673 Personal history of transient ischemic attack (TIA), and cerebral infarction without residual deficits: Secondary | ICD-10-CM | POA: Insufficient documentation

## 2012-03-08 DIAGNOSIS — E039 Hypothyroidism, unspecified: Secondary | ICD-10-CM | POA: Insufficient documentation

## 2012-03-08 DIAGNOSIS — R4701 Aphasia: Secondary | ICD-10-CM | POA: Insufficient documentation

## 2012-03-08 DIAGNOSIS — E785 Hyperlipidemia, unspecified: Secondary | ICD-10-CM | POA: Insufficient documentation

## 2012-03-08 LAB — CBC WITH DIFFERENTIAL/PLATELET
Basophils Absolute: 0 10*3/uL (ref 0.0–0.1)
Basophils Relative: 1 % (ref 0–1)
Eosinophils Absolute: 0.2 10*3/uL (ref 0.0–0.7)
Eosinophils Relative: 3 % (ref 0–5)
HCT: 37.6 % (ref 36.0–46.0)
Hemoglobin: 13.2 g/dL (ref 12.0–15.0)
Lymphocytes Relative: 31 % (ref 12–46)
Lymphs Abs: 2 10*3/uL (ref 0.7–4.0)
MCH: 30.6 pg (ref 26.0–34.0)
MCHC: 35.1 g/dL (ref 30.0–36.0)
MCV: 87 fL (ref 78.0–100.0)
Monocytes Absolute: 0.6 10*3/uL (ref 0.1–1.0)
Monocytes Relative: 9 % (ref 3–12)
Neutro Abs: 3.5 10*3/uL (ref 1.7–7.7)
Neutrophils Relative %: 56 % (ref 43–77)
Platelets: 175 10*3/uL (ref 150–400)
RBC: 4.32 MIL/uL (ref 3.87–5.11)
RDW: 12.5 % (ref 11.5–15.5)
WBC: 6.3 10*3/uL (ref 4.0–10.5)

## 2012-03-08 LAB — PROTIME-INR
INR: 0.97 (ref 0.00–1.49)
Prothrombin Time: 12.8 seconds (ref 11.6–15.2)

## 2012-03-08 LAB — POCT I-STAT, CHEM 8
BUN: 12 mg/dL (ref 6–23)
Calcium, Ion: 1.27 mmol/L (ref 1.13–1.30)
Chloride: 103 mEq/L (ref 96–112)
Creatinine, Ser: 0.9 mg/dL (ref 0.50–1.10)
Glucose, Bld: 108 mg/dL — ABNORMAL HIGH (ref 70–99)
HCT: 38 % (ref 36.0–46.0)
Hemoglobin: 12.9 g/dL (ref 12.0–15.0)
Potassium: 4.1 mEq/L (ref 3.5–5.1)
Sodium: 142 mEq/L (ref 135–145)
TCO2: 28 mmol/L (ref 0–100)

## 2012-03-08 LAB — APTT: aPTT: 35 seconds (ref 24–37)

## 2012-03-08 MED ORDER — MIDAZOLAM HCL 2 MG/2ML IJ SOLN
INTRAMUSCULAR | Status: AC | PRN
  Administered 2012-03-08: 1 mg via INTRAVENOUS
  Administered 2012-03-08 (×2): 0.5 mg via INTRAVENOUS

## 2012-03-08 MED ORDER — FENTANYL CITRATE 0.05 MG/ML IJ SOLN
INTRAMUSCULAR | Status: AC | PRN
  Administered 2012-03-08 (×2): 12.5 ug via INTRAVENOUS
  Administered 2012-03-08: 25 ug via INTRAVENOUS

## 2012-03-08 MED ORDER — MIDAZOLAM HCL 2 MG/2ML IJ SOLN
INTRAMUSCULAR | Status: AC
  Filled 2012-03-08: qty 4

## 2012-03-08 MED ORDER — SODIUM CHLORIDE 0.9 % IV SOLN: INTRAVENOUS | Status: AC

## 2012-03-08 MED ORDER — HEPARIN SOD (PORK) LOCK FLUSH 100 UNIT/ML IV SOLN
INTRAVENOUS | Status: AC | PRN
  Administered 2012-03-08 (×2): 500 [IU] via INTRAVENOUS

## 2012-03-08 MED ORDER — SODIUM CHLORIDE 0.9 % IV SOLN
INTRAVENOUS | Status: DC
  Administered 2012-03-08: 07:00:00 via INTRAVENOUS

## 2012-03-08 MED ORDER — FENTANYL CITRATE 0.05 MG/ML IJ SOLN
INTRAMUSCULAR | Status: AC
  Filled 2012-03-08: qty 4

## 2012-03-08 MED ORDER — IOHEXOL 300 MG/ML  SOLN
150.0000 mL | Freq: Once | INTRAMUSCULAR | Status: AC | PRN
  Administered 2012-03-08: 75 mL via INTRA_ARTERIAL

## 2012-03-08 NOTE — Progress Notes (Signed)
UP AND WALKED AND TOL WELL RIGHT GROIN NO BLEEDING OR HEMATOMA 

## 2012-03-08 NOTE — H&P (Signed)
Grace [name] is an 66 y.o. female.   Chief Complaint: TIA sxs 2 weeks ago Vision disturbance and headache None since; except still has slight headache off and on MRA 02/20/12 reveals Left Internal carotid artery stenosis Scheduled now for cerebral arteriogram HPI: Headache; HLD; hypothyroid; MVP; skin ca  Past Medical History  Diagnosis Date  . Hypothyroid   . Hyperlipidemia     Diet controlled  . Family history of anesthesia complication     Mother was hard to wake up after anesthesia  . Heart murmur   . Mitral valve prolapse   . Frequency of urination   . Dry eyes, bilateral   . Cancer     Skin Cancer, Squamous Cell  . TIA (transient ischemic attack) 02/19/2012  . Diastolic dysfunction 02/20/2012    Ejection fraction 75%.    Past Surgical History  Procedure Date  . Tubal ligation     38 yrs ago.  . Colonoscopy 12/18/2010    Procedure: COLONOSCOPY;  Surgeon: Malissa Hippo, MD;  [facility];  Service: Endoscopy;  Laterality: N/A;  1:00 pm  . Breast biopsy     left benign tumor  . Cholecystectomy   . Thyroidectomy 11/20/2011    Procedure: THYROIDECTOMY;  Surgeon: Serena Colonel, MD;  Location: [facility] OR;  Service: ENT;  Laterality: Left;  LEFT THYROID LOBECTOMY    Family History  Problem Relation Age of Onset  . Heart disease Mother   . Hypertension Mother    Social History:  reports that she has never smoked. She does not have any smokeless tobacco history on file. She reports that she does not drink alcohol or use illicit drugs.  Allergies:  Allergies  Allergen Reactions  . Compazine     Slurred speech, unsure of other reaction  . Sulfa Drugs Cross Reactors     rash     (Not in a hospital admission)  Results for orders placed during the hospital encounter of 03/08/12 (from the past 48 hour(s))  APTT     Status: Normal   Collection Time   03/08/12  7:13 AM      Component Value Range Comment   aPTT 35  24 - 37 seconds   CBC WITH DIFFERENTIAL      Status: Normal   Collection Time   03/08/12  7:13 AM      Component Value Range Comment   WBC 6.3  4.0 - 10.5 K/uL    RBC 4.32  3.87 - 5.11 MIL/uL    Hemoglobin 13.2  12.0 - 15.0 g/dL    HCT 16.1  09.6 - 04.5 %    MCV 87.0  78.0 - 100.0 fL    MCH 30.6  26.0 - 34.0 pg    MCHC 35.1  30.0 - 36.0 g/dL    RDW 40.9  81.1 - 91.4 %    Platelets 175  150 - 400 K/uL    Neutrophils Relative 56  43 - 77 %    Neutro Abs 3.5  1.7 - 7.7 K/uL    Lymphocytes Relative 31  12 - 46 %    Lymphs Abs 2.0  0.7 - 4.0 K/uL    Monocytes Relative 9  3 - 12 %    Monocytes Absolute 0.6  0.1 - 1.0 K/uL    Eosinophils Relative 3  0 - 5 %    Eosinophils Absolute 0.2  0.0 - 0.7 K/uL    Basophils Relative 1  0 - 1 %  Basophils Absolute 0.0  0.0 - 0.1 K/uL   PROTIME-INR     Status: Normal   Collection Time   03/08/12  7:13 AM      Component Value Range Comment   Prothrombin Time 12.8  11.6 - 15.2 seconds    INR 0.97  0.00 - 1.49   POCT I-STAT, CHEM 8     Status: Abnormal   Collection Time   03/08/12  7:23 AM      Component Value Range Comment   Sodium 142  135 - 145 mEq/L    Potassium 4.1  3.5 - 5.1 mEq/L    Chloride 103  96 - 112 mEq/L    BUN 12  6 - 23 mg/dL    Creatinine, Ser 1.61  0.50 - 1.10 mg/dL    Glucose, Bld 096 (*) 70 - 99 mg/dL    Calcium, Ion 0.45  4.09 - 1.30 mmol/L    TCO2 28  0 - 100 mmol/L    Hemoglobin 12.9  12.0 - 15.0 g/dL    HCT 81.1  91.4 - 78.2 %    No results found.  Review of Systems  Constitutional: Negative for fever and weight loss.  Eyes: Positive for blurred vision.  Respiratory: Negative for shortness of breath.   Cardiovascular: Negative for chest pain.  Gastrointestinal: Negative for nausea and vomiting.  Neurological: Positive for headaches. Negative for dizziness and weakness.    Blood pressure 121/64, pulse 79, temperature 98.7 F (37.1 C), temperature source Oral, resp. rate 20, height 5' 2.5" (1.588 m), weight 143 lb (64.864 kg), SpO2 97.00%. Physical Exam   Constitutional: She is oriented to person, place, and time. She appears well-developed and well-nourished.  Cardiovascular: Normal rate, regular rhythm and normal heart sounds.   No murmur heard. Respiratory: Effort normal and breath sounds normal. She has no wheezes.  GI: Soft. Bowel sounds are normal. There is no tenderness.  Musculoskeletal: Normal range of motion.  Neurological: She is alert and oriented to person, place, and time.  Skin: Skin is warm and dry.  Psychiatric: She has a normal mood and affect. Her behavior is normal. Judgment and thought content normal.     Assessment/Plan Sxs of visual disturbance and headache 2 weeks ago Abn MRA: L ICA stenosis Scheduled for cerebral arteriogram with Dr Corliss Skains Pt aware of procedure benefits and risks and agreeable to proceed Consent signed and in chart  Kendre Sires A 03/08/2012, 7:42 AM

## 2012-03-08 NOTE — Procedures (Signed)
S/P 4 vessel cerebral arteriogram. Rt CFA approach .Preliminary findings.Marland Kitchen 1.No occlusions,stenosis or dissections seen. 2.No aneurysms,or AV shunting seen.

## 2012-03-08 NOTE — ED Notes (Signed)
exoseal deployed

## 2012-07-05 ENCOUNTER — Other Ambulatory Visit: Payer: Self-pay | Admitting: Family Medicine

## 2012-07-05 LAB — TSH: TSH: 1.432 u[IU]/mL (ref 0.350–4.500)

## 2012-07-05 LAB — T4, FREE: Free T4: 1.12 ng/dL (ref 0.80–1.80)

## 2012-07-09 ENCOUNTER — Ambulatory Visit (INDEPENDENT_AMBULATORY_CARE_PROVIDER_SITE_OTHER): Payer: Medicare Other | Admitting: Family Medicine

## 2012-07-09 ENCOUNTER — Encounter: Payer: Self-pay | Admitting: Family Medicine

## 2012-07-09 VITALS — BP 142/84 | HR 90 | Wt 143.2 lb

## 2012-07-09 DIAGNOSIS — R079 Chest pain, unspecified: Secondary | ICD-10-CM

## 2012-07-09 DIAGNOSIS — R51 Headache: Secondary | ICD-10-CM

## 2012-07-09 LAB — SEDIMENTATION RATE: Sed Rate: 4 mm/hr (ref 0–22)

## 2012-07-09 MED ORDER — ONDANSETRON HCL 8 MG PO TABS: 8.0000 mg | ORAL_TABLET | Freq: Three times a day (TID) | ORAL | Status: DC | PRN

## 2012-07-09 NOTE — Progress Notes (Signed)
Subjective:    Patient ID: Grace [name], female    DOB: January 07, 1946, 67 y.o.   MRN: [id number]  Headache  This is a new problem. The current episode started yesterday. The problem occurs constantly. The problem has been waxing and waning. The pain is located in the bilateral and frontal region. The pain does not radiate. The pain quality is similar to prior headaches. The quality of the pain is described as aching, band-like and dull. The pain is at a severity of 4/10. The pain is mild. Associated symptoms include blurred vision, nausea and tingling (complained of right hand tingling). Pertinent negatives include no coughing, eye redness, eye watering, fever, muscle aches, neck pain or seizures. Visual change:  felt vision was fuzzy.  patient states when she had this happen to her it got so bad that she could not even think she had a difficult time reading words she could not phone anybody because she couldn't think of anybody's name she could not think of anybody stone numbers and she even state her vision was blurred and it lasted for about 30 minutes then went away she did have a little bit of tingling in her right hand she was worried about all of this.  Past medical history social history was reviewed patient not under any stress. Patient has been seen by private neurology in [place] who stated that more than likely this is complex migraines. She did not have any additional tests. She is worried about TIAs she is worried about possibility of stroke.   Review of Systems  Constitutional: Negative for fever.  HENT: Negative for neck pain.   Eyes: Positive for blurred vision. Negative for redness.  Respiratory: Negative for cough and choking.        Occasionally has to clear her throat  Cardiovascular: Positive for chest pain (patient had sharp chest pain that lasted several minutes on the left side of her chest this happened when she was worried about her headache).  Gastrointestinal: Positive  for nausea.  Neurological: Positive for tingling (complained of right hand tingling) and headaches. Negative for seizures.       Objective:   Physical Exam  Constitutional: She appears well-developed and well-nourished.  HENT:  Head: Normocephalic.  Neck: Normal range of motion. Neck supple.  Cardiovascular: Normal rate, regular rhythm and normal heart sounds.   Pulmonary/Chest: Breath sounds normal. No respiratory distress. She has no wheezes.  Abdominal: Soft.  Musculoskeletal: Normal range of motion. She exhibits no edema.  Lymphadenopathy:    She has no cervical adenopathy.  Neurological:  Neurologically she is alert and oriented by 3 reflexes good strength is good finger to nose normal Romberg negative  Skin: Skin is warm and dry. She is not diaphoretic.          Assessment & Plan:  Complex migraine-I. Believe this is the probable diagnosis Zofran may be used for nausea Tylenol for discomfort. I don't recommend Imitrex because of her age. I cannot rule out the possibility of some underlying neurologic issues or even TIAs I think it is reasonable to go ahead and be seen by [facility] for a second opinion. Patient is in agreement to this and desires this to be so. She was told to continue her medication we will increase aspirin to 162 mg daily and await the opinion of [employer]. I do not feel that this patient had a stroke I don't feel she's had a heart attack I would not recommend any additional testing currently.  I did tell her it would be important to make sure that her studies are taken with her for the University's to see. 25 minutes spent with patient.

## 2012-07-09 NOTE — Patient Instructions (Addendum)
Take 2 baby aspirins daily  We will set you up with [facility]  May use zofran for nausea when having a headache  May take tylenol for headache.

## 2012-07-13 ENCOUNTER — Telehealth: Payer: Self-pay | Admitting: *Deleted

## 2012-07-13 MED ORDER — TRAMADOL HCL 50 MG PO TABS
50.0000 mg | ORAL_TABLET | Freq: Four times a day (QID) | ORAL | Status: DC | PRN
Start: 2012-07-13 — End: 2012-10-11

## 2012-07-13 NOTE — Telephone Encounter (Signed)
May try tramadol 50 mg one q 6 hours prn, not for frequent use . #18 with 1 refill

## 2012-07-13 NOTE — Telephone Encounter (Signed)
Pt still having headaches. Taking tylenol with no relief. C apoth

## 2012-07-13 NOTE — Telephone Encounter (Signed)
Rx sent to [facility]. Patient notified.

## 2012-07-15 ENCOUNTER — Telehealth: Payer: Self-pay | Admitting: Family Medicine

## 2012-07-15 NOTE — Telephone Encounter (Signed)
Patient states that you told her we would only refer her to Neurology at [facility] if her labs were abnormal.  I had a referral in my workque so I did the referral.  When she received my information letter on appointment, she's now confused.  Please advise if patient needs to keep appointment at [facility] or cancel since labs were normal.

## 2012-07-15 NOTE — Telephone Encounter (Signed)
LMOVM of cell# and LMOM at pt's home number explained that Dr. Lorin Picket feels best to keep appointment with [facility] Neurology.

## 2012-07-15 NOTE — Telephone Encounter (Signed)
Even though the labs were normal I am concerned about the complex nature of these spells. It is true that complex migraines can cause this but it would be unusual for that to start up at her age. I want to be extra certain that this is not TIA. If it is TIA then a small number each year could have a stroke. It would be best for second opinion to occur via [facility].

## 2012-07-27 ENCOUNTER — Encounter: Payer: Self-pay | Admitting: Family Medicine

## 2012-08-27 ENCOUNTER — Other Ambulatory Visit: Payer: Self-pay | Admitting: Family Medicine

## 2012-09-06 ENCOUNTER — Ambulatory Visit (INDEPENDENT_AMBULATORY_CARE_PROVIDER_SITE_OTHER): Payer: 59 | Admitting: Family Medicine

## 2012-09-06 ENCOUNTER — Encounter: Payer: Self-pay | Admitting: Family Medicine

## 2012-09-06 VITALS — BP 113/69 | Temp 98.6°F | Wt 144.2 lb

## 2012-09-06 DIAGNOSIS — R0789 Other chest pain: Secondary | ICD-10-CM

## 2012-09-06 DIAGNOSIS — R109 Unspecified abdominal pain: Secondary | ICD-10-CM

## 2012-09-06 LAB — POCT URINALYSIS DIPSTICK
Spec Grav, UA: <=1.005
pH, UA: 6

## 2012-09-06 NOTE — Patient Instructions (Signed)
If worse call, use naprosyn 220 mg one twice daily for a week.  No sign of urine infection

## 2012-09-06 NOTE — Progress Notes (Signed)
  Subjective:    Patient ID: Grace [name], female    DOB: [birth year], 67 y.o.   MRN: [id number]  Flank Pain This is a new problem. The current episode started 1 to 4 weeks ago. The problem occurs constantly. The problem has been gradually improving since onset. Associated symptoms include abdominal pain. She has tried nothing for the symptoms.   She denies anything that may triggered it hurts when she moves a certain direction. The pain comes and goes it is fine when she is at rest no difficulty breathing. PMH benign family history benign   Review of Systems  Gastrointestinal: Positive for abdominal pain.  Genitourinary: Positive for flank pain.       Objective:   Physical Exam Lungs are clear heart is regular abdomen soft no masses there is some tenderness on the left side she states it hurts in the left lower ribs axillary line more so when she turns in that direction       Assessment & Plan:  Musculoskeletal pain-urinalysis negative. Over-the-counter anti-inflammatories for the next week recommended.  Also going to be seen specialist at [facility] in your future she will talk with them to make sure they received records if not she will call us and we will make copies for her.

## 2012-10-08 ENCOUNTER — Encounter: Payer: Self-pay | Admitting: *Deleted

## 2012-10-11 ENCOUNTER — Ambulatory Visit (INDEPENDENT_AMBULATORY_CARE_PROVIDER_SITE_OTHER): Payer: 59 | Admitting: Family Medicine

## 2012-10-11 ENCOUNTER — Encounter: Payer: Self-pay | Admitting: Family Medicine

## 2012-10-11 VITALS — BP 126/72 | HR 70 | Wt 141.6 lb

## 2012-10-11 DIAGNOSIS — Z0000 Encounter for general adult medical examination without abnormal findings: Secondary | ICD-10-CM

## 2012-10-11 DIAGNOSIS — E785 Hyperlipidemia, unspecified: Secondary | ICD-10-CM

## 2012-10-11 DIAGNOSIS — R51 Headache: Secondary | ICD-10-CM

## 2012-10-11 NOTE — Patient Instructions (Addendum)
Omeprazole 20 mg one daily, if choking sensation, call us and we will set up GI Dr.Rehman Wrist splint at night time  Schedule wellness with me or Eber Jones

## 2012-10-11 NOTE — Progress Notes (Signed)
  Subjective:    Patient ID: Grace [name], female    DOB: [birth year], 67 y.o.   MRN: [id number]  Headache  This is a new problem. The current episode started more than 1 month ago. The problem has been resolved. The pain is at a severity of 0/10. The patient is experiencing no pain. Nothing aggravates the symptoms. The treatment provided significant relief. Her past medical history is significant for migraine headaches.   she has not had any return of headaches since what went on earlier this year she did see the neurologist at [facility] they reviewed over the information patient wasn't sure if all the information was reviewed she did take the studies from [facility] with her. The studies did in fact have the MRI as well as arteriogram. All of this was reviewed with the patient it was reviewed again while she was here there was no findings of any significant blockages  She also relates intermittent choking sensation in the back of her throat like food doesn't go down properly she denies shortness of breath with it this comes and goes. Has not had any true blockage. No vomiting with it.  Intermittent numbness of the right hand that comes and goes. Occasionally wakes up with this. Does not have any weakness with it. No known triggers. PMH hyperthyroidism had thyroidectomy has had occasional hoarseness comes and goes since then but is not persistent. Patient does not smoke.    Review of Systems  Neurological: Positive for headaches.   she's not having any headaches currently. Carpal tunnel-symptoms come and go right hand. Dysphagia-intermittent no choking with it though. occas hoarseness [facility] specialist neurology- no asa lipids    Objective:   Physical Exam Neck no masses throat is normal lungs are clear heart is regular pulse normal negative Tinel's strength normal patient's voice sounds normal compared to previous       Assessment & Plan:  Genella Rife like Sx/ slight intermittent  hoarseness/ occas choking-Omeprazole 30 day trial may need GI referral, she will let us know if this is not helping. Minimize caffeine and chocolates. Carpal tunnel Sx- splint at night if worse NCV, pt will let us know, patient does not want to have testing currently. Hypothy- per Dr Lurene Shadow, endocrinologist follows this. Migraines- no further test needed, no sign of any vascular blockage. See patient back 6 months.

## 2012-10-12 LAB — HEPATIC FUNCTION PANEL
ALT: 172 U/L — ABNORMAL HIGH (ref 0–35)
AST: 164 U/L — ABNORMAL HIGH (ref 0–37)
Albumin: 4.2 g/dL (ref 3.5–5.2)
Alkaline Phosphatase: 199 U/L — ABNORMAL HIGH (ref 39–117)
Bilirubin, Direct: 0.2 mg/dL (ref 0.0–0.3)
Indirect Bilirubin: 0.9 mg/dL (ref 0.0–0.9)
Total Bilirubin: 1.1 mg/dL (ref 0.3–1.2)
Total Protein: 7.1 g/dL (ref 6.0–8.3)

## 2012-10-12 LAB — LIPID PANEL
Cholesterol: 119 mg/dL (ref 0–200)
HDL: 35 mg/dL — ABNORMAL LOW (ref >39)
LDL Cholesterol: 66 mg/dL (ref 0–99)
Total CHOL/HDL Ratio: 3.4 Ratio
Triglycerides: 88 mg/dL (ref <150)
VLDL: 18 mg/dL (ref 0–40)

## 2012-10-12 LAB — GLUCOSE, RANDOM: Glucose, Bld: 96 mg/dL (ref 70–99)

## 2012-10-13 ENCOUNTER — Other Ambulatory Visit: Payer: Self-pay

## 2012-10-13 DIAGNOSIS — R748 Abnormal levels of other serum enzymes: Secondary | ICD-10-CM

## 2012-11-15 ENCOUNTER — Encounter: Payer: Self-pay | Admitting: Nurse Practitioner

## 2012-11-15 ENCOUNTER — Ambulatory Visit (INDEPENDENT_AMBULATORY_CARE_PROVIDER_SITE_OTHER): Payer: 59 | Admitting: Nurse Practitioner

## 2012-11-15 VITALS — BP 130/70 | Ht 63.0 in | Wt 142.2 lb

## 2012-11-15 DIAGNOSIS — K589 Irritable bowel syndrome without diarrhea: Secondary | ICD-10-CM

## 2012-11-15 DIAGNOSIS — Z01419 Encounter for gynecological examination (general) (routine) without abnormal findings: Secondary | ICD-10-CM

## 2012-11-15 DIAGNOSIS — Z124 Encounter for screening for malignant neoplasm of cervix: Secondary | ICD-10-CM

## 2012-11-15 DIAGNOSIS — Z0000 Encounter for general adult medical examination without abnormal findings: Secondary | ICD-10-CM

## 2012-11-15 DIAGNOSIS — K219 Gastro-esophageal reflux disease without esophagitis: Secondary | ICD-10-CM

## 2012-11-15 DIAGNOSIS — Z13820 Encounter for screening for osteoporosis: Secondary | ICD-10-CM

## 2012-11-15 NOTE — Progress Notes (Signed)
  Subjective:    Patient ID: Grace [name], female    DOB: November 28, 1945, 67 y.o.   MRN: [id number]  HPI presents for her wellness checkup. Patient has paperwork to do her liver enzymes again today. Stop her Lipitor per her doctor's instructions. No new sexual partners. No vaginal bleeding. No pelvic pain. Gets regular vision and dental exams. Has had her colonoscopy. Has not had a bone density study. Continues to have some reflux symptoms. Did not get omeprazole as recommended at last visit. Alternating cycles of diarrhea and constipation, no blood in her stool. No abdominal pain. Denies any changes in her stress level.    Review of Systems  Constitutional: Positive for fatigue. Negative for fever, activity change and appetite change.  HENT: Negative for hearing loss, ear pain, congestion, sore throat, rhinorrhea, dental problem and sinus pressure.   Eyes: Negative for visual disturbance.  Respiratory: Negative for cough, chest tightness, shortness of breath and wheezing.   Cardiovascular: Negative for chest pain, palpitations and leg swelling.  Gastrointestinal: Positive for diarrhea and constipation. Negative for nausea, vomiting, abdominal pain and blood in stool.  Genitourinary: Negative for dysuria, urgency, frequency, vaginal bleeding, vaginal discharge, enuresis, difficulty urinating, menstrual problem and pelvic pain.  Neurological: Negative for syncope and headaches.  Psychiatric/Behavioral: Negative for sleep disturbance and dysphoric mood. The patient is not nervous/anxious.        Objective:   Physical Exam  Vitals reviewed. Constitutional: She is oriented to person, place, and time. She appears well-developed. No distress.  HENT:  Right Ear: External ear normal.  Left Ear: External ear normal.  Mouth/Throat: Oropharynx is clear and moist.  Neck: Normal range of motion. Neck supple. No tracheal deviation present. No thyromegaly present.  Cardiovascular: Normal rate, regular  rhythm and normal heart sounds.  Exam reveals no gallop.   No murmur heard. Pulmonary/Chest: Effort normal and breath sounds normal.  Abdominal: Soft. Bowel sounds are normal. She exhibits no distension and no mass. There is tenderness. There is no rebound and no guarding.  Genitourinary: Vagina normal and uterus normal. No vaginal discharge found.  Musculoskeletal: She exhibits no edema.  Lymphadenopathy:    She has no cervical adenopathy.  Neurological: She is alert and oriented to person, place, and time.  Skin: Skin is warm and dry. No rash noted.  Psychiatric: She has a normal mood and affect. Her behavior is normal.   Breast exam: No masses noted, axilla no adenopathy. External GU normal. Vagina slightly pink and moist, no discharge. Bimanual exam normal, ovaries nonpalpable. Abdominal exam very mild epigastric area tenderness noted.        Assessment & Plan:  Well woman exam  Routine general medical examination at a health care facility - Plan: POC Hemoccult Bld/Stl (3-Cd Home Screen)  Pap smear for cervical cancer screening - Plan: Pap IG w/ reflex to HPV when ASC-U  GERD (gastroesophageal reflux disease)  Irritable bowel syndrome  Screening for osteoporosis - Plan: DG Bone Density  Recommend OTC omeprazole as directed. Discussed importance of stress reduction. Recommend vitamin D and calcium daily. Next physical in one year.

## 2012-11-16 ENCOUNTER — Encounter: Payer: Self-pay | Admitting: Nurse Practitioner

## 2012-11-16 DIAGNOSIS — K219 Gastro-esophageal reflux disease without esophagitis: Secondary | ICD-10-CM | POA: Insufficient documentation

## 2012-11-16 DIAGNOSIS — K589 Irritable bowel syndrome without diarrhea: Secondary | ICD-10-CM | POA: Insufficient documentation

## 2012-11-16 LAB — PAP IG W/ RFLX HPV ASCU

## 2012-11-16 LAB — HEPATIC FUNCTION PANEL
ALT: 24 U/L (ref 0–35)
AST: 28 U/L (ref 0–37)
Albumin: 3.9 g/dL (ref 3.5–5.2)
Alkaline Phosphatase: 82 U/L (ref 39–117)
Bilirubin, Direct: 0.2 mg/dL (ref 0.0–0.3)
Indirect Bilirubin: 0.9 mg/dL (ref 0.0–0.9)
Total Bilirubin: 1.1 mg/dL (ref 0.3–1.2)
Total Protein: 6.5 g/dL (ref 6.0–8.3)

## 2012-11-18 ENCOUNTER — Telehealth: Payer: Self-pay | Admitting: Nurse Practitioner

## 2012-11-18 ENCOUNTER — Encounter: Payer: Self-pay | Admitting: Family Medicine

## 2012-11-18 NOTE — Telephone Encounter (Signed)
Bone density test ordered and wants you to give her a call concerning cholesterol and referral to dermatology.

## 2012-11-18 NOTE — Telephone Encounter (Signed)
Patient needs you to call her regarding the bone density test that was supposed to be scheduled.

## 2012-11-18 NOTE — Telephone Encounter (Signed)
Please call patient. I think that order has been done.

## 2012-11-19 ENCOUNTER — Other Ambulatory Visit: Payer: Self-pay | Admitting: *Deleted

## 2012-11-22 ENCOUNTER — Other Ambulatory Visit: Payer: Self-pay | Admitting: Family Medicine

## 2012-11-23 ENCOUNTER — Ambulatory Visit (HOSPITAL_COMMUNITY)
Admission: RE | Admit: 2012-11-23 | Discharge: 2012-11-23 | Disposition: A | Discharge status: Home / Self Care | Payer: Medicare Other | Source: Ambulatory Visit | Attending: Nurse Practitioner | Admitting: Nurse Practitioner

## 2012-11-23 DIAGNOSIS — M818 Other osteoporosis without current pathological fracture: Secondary | ICD-10-CM | POA: Insufficient documentation

## 2012-11-23 DIAGNOSIS — Z13820 Encounter for screening for osteoporosis: Secondary | ICD-10-CM

## 2012-11-24 ENCOUNTER — Telehealth: Payer: Self-pay | Admitting: Nurse Practitioner

## 2012-11-24 NOTE — Telephone Encounter (Signed)
Pt would like for you to call her in regards to her Bone Density results and other questions

## 2012-11-24 NOTE — Telephone Encounter (Signed)
Patient states that she had some questions about probiotics and seeing a dermatologist. She would not give me anymore information. She stated that if she can't talk directly to the provider then she will find her another provider that will call patients and talk to them.

## 2012-12-02 ENCOUNTER — Telehealth: Payer: Self-pay | Admitting: Family Medicine

## 2012-12-02 ENCOUNTER — Other Ambulatory Visit: Payer: Self-pay | Admitting: Nurse Practitioner

## 2012-12-02 MED ORDER — ALENDRONATE SODIUM 70 MG PO TABS: 70.0000 mg | ORAL_TABLET | ORAL | Status: DC

## 2012-12-02 NOTE — Telephone Encounter (Signed)
Discussed results with patient.  Agrees to start on Fosamax.  Will mail info on low fat, low cholesterol diet. Recheck lipid profile off medication in early November. Will refer to dermatologist for skin cancer screening.

## 2012-12-02 NOTE — Telephone Encounter (Signed)
Patient would like for someone to call her with the results of her Bone Density Test.  Please call Patient. Thanks

## 2012-12-03 ENCOUNTER — Other Ambulatory Visit: Payer: Self-pay | Admitting: Nurse Practitioner

## 2012-12-03 ENCOUNTER — Other Ambulatory Visit (INDEPENDENT_AMBULATORY_CARE_PROVIDER_SITE_OTHER): Payer: 59 | Admitting: *Deleted

## 2012-12-03 DIAGNOSIS — Z1283 Encounter for screening for malignant neoplasm of skin: Secondary | ICD-10-CM

## 2012-12-03 DIAGNOSIS — Z0000 Encounter for general adult medical examination without abnormal findings: Secondary | ICD-10-CM

## 2012-12-03 LAB — POC HEMOCCULT BLD/STL (HOME/3-CARD/SCREEN)
Card #2 Fecal Occult Blod, POC: NEGATIVE
Card #3 Fecal Occult Blood, POC: NEGATIVE
Fecal Occult Blood, POC: NEGATIVE

## 2013-01-04 ENCOUNTER — Encounter: Payer: Self-pay | Admitting: Family Medicine

## 2013-01-04 ENCOUNTER — Ambulatory Visit (INDEPENDENT_AMBULATORY_CARE_PROVIDER_SITE_OTHER): Payer: 59 | Admitting: Family Medicine

## 2013-01-04 VITALS — BP 118/72 | Temp 98.8°F | Ht 63.0 in | Wt 142.4 lb

## 2013-01-04 DIAGNOSIS — K2970 Gastritis, unspecified, without bleeding: Secondary | ICD-10-CM

## 2013-01-04 MED ORDER — PANTOPRAZOLE SODIUM 40 MG PO TBEC: 40.0000 mg | DELAYED_RELEASE_TABLET | Freq: Every day | ORAL | Status: DC

## 2013-01-04 NOTE — Progress Notes (Signed)
  Subjective:    Patient ID: Grace [name], female    DOB: September 26, 1945, 67 y.o.   MRN: [id number]  HPI Patient is here today for a scratchy throat. It is not really sore. She woke up with this yesterday morning. She does not know if it is a side effect from taking Fosamax.   Positive low gr sweats and chills  Hands chiils,  Takes the fosamax every Sunday, patient has been on Fosamax for just 4 doses.  Patient notes substernal burning. Some upper abdominal discomfort. Almost a sensation of food hanging. She has never had substantial reflux symptoms in the past.  Prior history significant for recent diagnosis of osteoporosis.    Review of Systems Otherwise negative.    Objective:   Physical Exam  Alert anxious no acute distress. Lungs clear. Heart regular in rhythm. Pharynx normal. Epigastrium tenderness evident.      Assessment & Plan:  Impression flare reflux secondary to Fosamax most likely diagnosis plan hold off Fosamax initiate protonic 40 daily recheck with Dr. Lorin Picket or Eber Jones in several weeks. WSL

## 2013-01-25 ENCOUNTER — Ambulatory Visit (INDEPENDENT_AMBULATORY_CARE_PROVIDER_SITE_OTHER): Payer: 59 | Admitting: Family Medicine

## 2013-01-25 ENCOUNTER — Encounter: Payer: Self-pay | Admitting: Family Medicine

## 2013-01-25 VITALS — BP 110/70 | Ht 62.0 in | Wt 140.2 lb

## 2013-01-25 DIAGNOSIS — K219 Gastro-esophageal reflux disease without esophagitis: Secondary | ICD-10-CM

## 2013-01-25 DIAGNOSIS — M810 Age-related osteoporosis without current pathological fracture: Secondary | ICD-10-CM

## 2013-01-25 DIAGNOSIS — Z23 Encounter for immunization: Secondary | ICD-10-CM

## 2013-01-25 NOTE — Progress Notes (Signed)
  Subjective:    Patient ID: Grace [name], female    DOB: 1945-09-11, 67 y.o.   MRN: [id number]  HPI Patient is here today to follow up on her acid reflux secondary to taking Fosamax. Patient is currently taking protonix as needed for her symptoms. She states that she is doing a lot better and since stopping the Fosamax she had not had another reflux attack.  PMH recent bone density, reflux  Review of Systems No reflux symptoms since using medication and stopping Fosamax.    Objective:   Physical Exam Lungs clear heart regular blood pressure good       Assessment & Plan:  20 minutes was spent with the patient. FRAX score was reviewed. Patient rates low. She does have osteopenia slot/osteoporosis she did not tolerate Fosamax I recommend calcium vitamin D a repeat bone density test in 2 years time. Benefits of IV medication are not essential at this point. Patient agrees. Keep regular followups.

## 2013-02-08 ENCOUNTER — Telehealth: Payer: Self-pay | Admitting: Family Medicine

## 2013-02-08 MED ORDER — ONDANSETRON HCL 8 MG PO TABS: 8.0000 mg | ORAL_TABLET | Freq: Three times a day (TID) | ORAL | Status: DC | PRN

## 2013-02-08 NOTE — Telephone Encounter (Signed)
Medication sent to pharmacy in [facility]. Left message on voicemail notifying patient.

## 2013-02-08 NOTE — Telephone Encounter (Signed)
Patient states she has Nausea and sick on stomach for several days.  Can something be phoned in for these symptoms or does patient need to be seen?  [facility]

## 2013-02-08 NOTE — Telephone Encounter (Signed)
NTC- zofran 8 mg, #21 1 tid prn , f/u if fever, or worse or severe abd pain

## 2013-02-11 ENCOUNTER — Ambulatory Visit (INDEPENDENT_AMBULATORY_CARE_PROVIDER_SITE_OTHER): Payer: 59 | Admitting: Family Medicine

## 2013-02-11 ENCOUNTER — Encounter: Payer: Self-pay | Admitting: Family Medicine

## 2013-02-11 VITALS — BP 114/70 | Temp 98.6°F | Ht 62.0 in | Wt 139.1 lb

## 2013-02-11 DIAGNOSIS — J019 Acute sinusitis, unspecified: Secondary | ICD-10-CM

## 2013-02-11 MED ORDER — PANTOPRAZOLE SODIUM 40 MG PO TBEC: 40.0000 mg | DELAYED_RELEASE_TABLET | Freq: Every day | ORAL | Status: DC

## 2013-02-11 MED ORDER — LEVOFLOXACIN 500 MG PO TABS: 500.0000 mg | ORAL_TABLET | Freq: Every day | ORAL | Status: AC

## 2013-02-11 NOTE — Progress Notes (Signed)
  Subjective:    Patient ID: Grace [name], female    DOB: January 26, 1946, 67 y.o.   MRN: [id number]  Sinusitis This is a new problem. The current episode started in the past 7 days. The problem has been gradually worsening since onset. There has been no fever. Her pain is at a severity of 8/10. The pain is moderate. Associated symptoms include congestion, coughing, headaches and sinus pressure. Past treatments include nothing. The treatment provided no relief.   satretd 2 days ago with nausea, also with chest congestion No fever . Has some sweats, clearing the throat Coughing some Feels pressure in head when bening over Poor appetite   Review of Systems  HENT: Positive for congestion and sinus pressure.   Respiratory: Positive for cough.   Neurological: Positive for headaches.       Objective:   Physical Exam Lungs are clear hearts regular mild sinus tenderness eardrums normal throat is normal neck supple       Assessment & Plan:  Acute sinusitis-antibiotics prescribed. Followup ongoing trouble Warning signs were discussed followup if issues

## 2013-02-17 ENCOUNTER — Other Ambulatory Visit: Payer: Self-pay | Admitting: Family Medicine

## 2013-02-23 ENCOUNTER — Telehealth: Payer: Self-pay | Admitting: Family Medicine

## 2013-02-23 ENCOUNTER — Other Ambulatory Visit: Payer: Self-pay | Admitting: *Deleted

## 2013-02-23 MED ORDER — LEVOFLOXACIN 500 MG PO TABS: 500.0000 mg | ORAL_TABLET | Freq: Every day | ORAL | Status: DC

## 2013-02-23 NOTE — Telephone Encounter (Signed)
May give one refill of Levaquin, followup if further symptoms or ongoing symptoms.

## 2013-02-23 NOTE — Telephone Encounter (Signed)
Patient needs a refill on Levaquin 500 Mg [facility]

## 2013-02-23 NOTE — Telephone Encounter (Signed)
Med sent to pharm. Pt notified.  

## 2013-02-23 NOTE — Telephone Encounter (Signed)
Last night temple and side of face started hurting again. Finished levaquin and symptoms cleared up. But symptoms started back last night. No fever. [facility]

## 2013-05-04 ENCOUNTER — Telehealth: Payer: Self-pay | Admitting: Family Medicine

## 2013-05-04 DIAGNOSIS — R634 Abnormal weight loss: Secondary | ICD-10-CM

## 2013-05-04 NOTE — Telephone Encounter (Signed)
Pt has lost 10 pounds in the past few months without trying to diet, she wants  To know if she can have her thyroid checked out?   Can't eat as much an loosing weight..been on fosemax and probiotic for her stomach. But stopped taking these meds because of the weight loss.   But not sure now if it is that or her thyroid causing the issue.  Feels very tired   If we can order the thyroid check she will come back in for a check up

## 2013-05-05 ENCOUNTER — Other Ambulatory Visit: Payer: Self-pay | Admitting: *Deleted

## 2013-05-05 DIAGNOSIS — R634 Abnormal weight loss: Secondary | ICD-10-CM

## 2013-05-05 LAB — CBC WITH DIFFERENTIAL/PLATELET
Basophils Absolute: 0 10*3/uL (ref 0.0–0.1)
Basophils Relative: 1 % (ref 0–1)
Eosinophils Absolute: 0.1 10*3/uL (ref 0.0–0.7)
Eosinophils Relative: 1 % (ref 0–5)
HCT: 40.5 % (ref 36.0–46.0)
Hemoglobin: 13.8 g/dL (ref 12.0–15.0)
Lymphocytes Relative: 25 % (ref 12–46)
Lymphs Abs: 1.8 10*3/uL (ref 0.7–4.0)
MCH: 30.3 pg (ref 26.0–34.0)
MCHC: 34.1 g/dL (ref 30.0–36.0)
MCV: 89 fL (ref 78.0–100.0)
Monocytes Absolute: 0.6 10*3/uL (ref 0.1–1.0)
Monocytes Relative: 8 % (ref 3–12)
Neutro Abs: 4.7 10*3/uL (ref 1.7–7.7)
Neutrophils Relative %: 65 % (ref 43–77)
Platelets: 200 10*3/uL (ref 150–400)
RBC: 4.55 MIL/uL (ref 3.87–5.11)
RDW: 13.2 % (ref 11.5–15.5)
WBC: 7.2 10*3/uL (ref 4.0–10.5)

## 2013-05-05 LAB — TSH: TSH: 1.344 u[IU]/mL (ref 0.350–4.500)

## 2013-05-05 LAB — BASIC METABOLIC PANEL
BUN: 13 mg/dL (ref 6–23)
CO2: 30 mEq/L (ref 19–32)
Calcium: 8.9 mg/dL (ref 8.4–10.5)
Chloride: 102 mEq/L (ref 96–112)
Creat: 0.84 mg/dL (ref 0.50–1.10)
Glucose, Bld: 74 mg/dL (ref 70–99)
Potassium: 3.7 mEq/L (ref 3.5–5.3)
Sodium: 138 mEq/L (ref 135–145)

## 2013-05-05 LAB — HEPATIC FUNCTION PANEL
ALT: 19 U/L (ref 0–35)
AST: 21 U/L (ref 0–37)
Albumin: 4.1 g/dL (ref 3.5–5.2)
Alkaline Phosphatase: 60 U/L (ref 39–117)
Bilirubin, Direct: 0.2 mg/dL (ref 0.0–0.3)
Indirect Bilirubin: 0.9 mg/dL (ref 0.2–1.2)
Total Bilirubin: 1.1 mg/dL (ref 0.2–1.2)
Total Protein: 6.8 g/dL (ref 6.0–8.3)

## 2013-05-05 LAB — T4, FREE: Free T4: 1.26 ng/dL (ref 0.80–1.80)

## 2013-05-05 NOTE — Telephone Encounter (Signed)
Weight loss needs looking into. Many medical issues can cause this as well as stress. Check CBC,Liver, Met 7, TSH, free T 4 . Do labs and schedule f/u OV

## 2013-05-05 NOTE — Telephone Encounter (Signed)
Patient notified and verbalized understanding. 

## 2013-05-06 ENCOUNTER — Encounter: Payer: Self-pay | Admitting: Family Medicine

## 2013-05-10 ENCOUNTER — Ambulatory Visit (INDEPENDENT_AMBULATORY_CARE_PROVIDER_SITE_OTHER): Payer: 59 | Admitting: Family Medicine

## 2013-05-10 ENCOUNTER — Encounter: Payer: Self-pay | Admitting: Family Medicine

## 2013-05-10 VITALS — BP 128/74 | Ht 62.0 in | Wt 133.0 lb

## 2013-05-10 DIAGNOSIS — M810 Age-related osteoporosis without current pathological fracture: Secondary | ICD-10-CM

## 2013-05-10 DIAGNOSIS — E559 Vitamin D deficiency, unspecified: Secondary | ICD-10-CM

## 2013-05-10 DIAGNOSIS — E039 Hypothyroidism, unspecified: Secondary | ICD-10-CM

## 2013-05-10 DIAGNOSIS — K219 Gastro-esophageal reflux disease without esophagitis: Secondary | ICD-10-CM

## 2013-05-10 DIAGNOSIS — R002 Palpitations: Secondary | ICD-10-CM

## 2013-05-10 MED ORDER — RISEDRONATE SODIUM 35 MG PO TABS: 35.0000 mg | ORAL_TABLET | ORAL | Status: DC

## 2013-05-10 NOTE — Progress Notes (Signed)
   Subjective:    Patient ID: Grace [name], female    DOB: July 03, 1945, 68 y.o.   MRN: [id number]  HPI Patient is here today to go over blood work.   Tiredness/ thyroid/ good with taking the med/ no dysphagia/ no pain/ at times throat feels tight with swallowing/ no daytime sleepiness/ good moods/ no fever/ appetite fair/ eats small amounts/ BM nl  Temp Ha/ moderate for the past week/ takes Tylenol she denies waking up with [place] in the middle night denies any persistent symptoms occasional funny vision or double vision.  Some sinus Sx- for about a week with nasal stuffiness/ some cough/ no blood  Cataract - Dr Geoffry Paradise March  Some issues with swallowing, voice at times hoarse/ static sound in ears/   Right Hip, some aching with walking/ light exercise 15 min per / some radiation into thje right leg/     Review of Systems     Objective:   Physical Exam Eardrums normal throat is normal neck is supple no masses are felt subjective tenderness along the posterior aspect of the and neck no masses felt. Anterior portion of the neck no masses felt. Lungs are clear no crackles. Heart is regular no murmurs. Abdomen soft with subjective epigastric and periumbilical area discomfort. Extremities no edema skin warm dry. Neurologic grossly normal.       Assessment & Plan:  #1-palpitations-EKG overall is stable. It doesn't seem that these symptoms were persistent enough to pursue with telemetry testing at this point. It also does not seem that the heart rate is excessively fast. So I doubt PSVT. If she has ongoing trouble with it she will let us know.  #2 osteoporosis-she did not tolerate Fosamax. We will try Actonel. If she does not tolerate this then we will need to use IV Boniva or something similar. We will check vitamin D level.  #3 hypothyroidism-her treatment his right on spot. Continue the current measures. No need to check lab work in the near future.  #4 fatigue and tiredness I encourage her  to keep healthy get proper rest I find no evidence of any type of cancer. She is up-to-date on colonoscopy and mammogram. She has lost some weight but she states she doesn't eat quite as much as he used to go she doesn't get as hungry. If the weight loss continues we'll need to do further testing she ought to followup in 3 months  #5 she does relate some throat symptoms occasional hoarseness occasional difficulty with swallowing but food does not get stuck. She denies any hemoptysis. She is she is able to talk plainly today. If her symptoms recur or get worse referral to ENT for further evaluation. I also told the patient she starts having difficulty swallowing food of a getting stuck in the upper esophagus she will need referral to gastroenterology for endoscopy and stretching.  #6 intermittent sciatica down the right leg right stretching exercises core exercises were shown if she does these this should help. Certainly if he gets progressively worse he may need other interventions  She was told that if she starts having frequent headaches or if they wake her up in the middle night or persistent neurologic symptoms such as double vision followup sooner.  30 minutes spent with this patient she was told to followup sooner if any of her problems get worse

## 2013-05-11 ENCOUNTER — Encounter: Payer: Self-pay | Admitting: Family Medicine

## 2013-05-11 LAB — VITAMIN D 25 HYDROXY (VIT D DEFICIENCY, FRACTURES): Vit D, 25-Hydroxy: 46 ng/mL (ref 30–89)

## 2013-06-13 ENCOUNTER — Encounter: Payer: Self-pay | Admitting: Family Medicine

## 2013-06-28 ENCOUNTER — Encounter (HOSPITAL_COMMUNITY): Payer: Self-pay | Admitting: Pharmacy Technician

## 2013-07-04 NOTE — Patient Instructions (Addendum)
Your procedure is scheduled on:  07/11/2013  Report to [facility] at      AM.  Call this number if you have problems the morning of surgery: [phone]   Remember:   Do not eat or drink :After Midnight.    Take these medicines the morning of surgery with A SIP OF WATER: Levothyroxine   Do not wear jewelry, make-up or nail polish.  Do not wear lotions, powders, or perfumes. You may wear deodorant.  Do not shave 48 hours prior to surgery.  Do not bring valuables to the hospital.  Contacts, dentures or bridgework may not be worn into surgery.  Patients discharged the day of surgery will not be allowed to drive home.  Name and phone number of your driver:    Please read over the following fact sheets that you were given: Pain Booklet, Surgical Site Infection Prevention, Anesthesia Post-op Instructions and Care and Recovery After Surgery  Cataract Surgery  A cataract is a clouding of the lens of the eye. When a lens becomes cloudy, vision is reduced based on the degree and nature of the clouding. Surgery may be needed to improve vision. Surgery removes the cloudy lens and usually replaces it with a substitute lens (intraocular lens, IOL). LET YOUR EYE DOCTOR KNOW ABOUT:  Allergies to food or medicine.   Medicines taken including herbs, eyedrops, over-the-counter medicines, and creams.   Use of steroids (by mouth or creams).   Previous problems with anesthetics or numbing medicine.   History of bleeding problems or blood clots.   Previous surgery.   Other health problems, including diabetes and kidney problems.   Possibility of pregnancy, if this applies.  RISKS AND COMPLICATIONS  Infection.   Inflammation of the eyeball (endophthalmitis) that can spread to both eyes (sympathetic ophthalmia).   Poor wound healing.   If an IOL is inserted, it can later fall out of proper position. This is very uncommon.   Clouding of the part of your eye that holds an IOL in place. This is  called an "after-cataract." These are uncommon, but easily treated.  BEFORE THE PROCEDURE  Do not eat or drink anything except small amounts of water for 8 to 12 before your surgery, or as directed by your caregiver.   Unless you are told otherwise, continue any eyedrops you have been prescribed.   Talk to your primary caregiver about all other medicines that you take (both prescription and non-prescription). In some cases, you may need to stop or change medicines near the time of your surgery. This is most important if you are taking blood-thinning medicine.Do not stop medicines unless you are told to do so.   Arrange for someone to drive you to and from the procedure.   Do not put contact lenses in either eye on the day of your surgery.  PROCEDURE There is more than one method for safely removing a cataract. Your doctor can explain the differences and help determine which is best for you. Phacoemulsification surgery is the most common form of cataract surgery.  An injection is given behind the eye or eyedrops are given to make this a painless procedure.   A small cut (incision) is made on the edge of the clear, dome-shaped surface that covers the front of the eye (cornea).   A tiny probe is painlessly inserted into the eye. This device gives off ultrasound waves that soften and break up the cloudy center of the lens. This makes it easier for  the cloudy lens to be removed by suction.   An IOL may be implanted.   The normal lens of the eye is covered by a clear capsule. Part of that capsule is intentionally left in the eye to support the IOL.   Your surgeon may or may not use stitches to close the incision.  There are other forms of cataract surgery that require a larger incision and stiches to close the eye. This approach is taken in cases where the doctor feels that the cataract cannot be easily removed using phacoemulsification. AFTER THE PROCEDURE  When an IOL is implanted, it  does not need care. It becomes a permanent part of your eye and cannot be seen or felt.   Your doctor will schedule follow-up exams to check on your progress.   Review your other medicines with your doctor to see which can be resumed after surgery.   Use eyedrops or take medicine as prescribed by your doctor.  Document Released: 03/06/2011 Document Reviewed: 03/03/2011 [facility] Patient Information 2012 [place].  .Cataract Surgery Care After Refer to this sheet in the next few weeks. These instructions provide you with information on caring for yourself after your procedure. Your caregiver may also give you more specific instructions. Your treatment has been planned according to current medical practices, but problems sometimes occur. Call your caregiver if you have any problems or questions after your procedure.  HOME CARE INSTRUCTIONS   Avoid strenuous activities as directed by your caregiver.   Ask your caregiver when you can resume driving.   Use eyedrops or other medicines to help healing and control pressure inside your eye as directed by your caregiver.   Only take over-the-counter or prescription medicines for pain, discomfort, or fever as directed by your caregiver.   Do not to touch or rub your eyes.   You may be instructed to use a protective shield during the first few days and nights after surgery. If not, wear sunglasses to protect your eyes. This is to protect the eye from pressure or from being accidentally bumped.   Keep the area around your eye clean and dry. Avoid swimming or allowing water to hit you directly in the face while showering. Keep soap and shampoo out of your eyes.   Do not bend or lift heavy objects. Bending increases pressure in the eye. You can walk, climb stairs, and do light household chores.   Do not put a contact lens into the eye that had surgery until your caregiver says it is okay to do so.   Ask your doctor when you can return to  work. This will depend on the kind of work that you do. If you work in a dusty environment, you may be advised to wear protective eyewear for a period of time.   Ask your caregiver when it will be safe to engage in sexual activity.   Continue with your regular eye exams as directed by your caregiver.  What to expect:  It is normal to feel itching and mild discomfort for a few days after cataract surgery. Some fluid discharge is also common, and your eye may be sensitive to light and touch.   After 1 to 2 days, even moderate discomfort should disappear. In most cases, healing will take about 6 weeks.   If you received an intraocular lens (IOL), you may notice that colors are very bright or have a blue tinge. Also, if you have been in bright sunlight, everything may appear  reddish for a few hours. If you see these color tinges, it is because your lens is clear and no longer cloudy. Within a few months after receiving an IOL, these extra colors should go away. When you have healed, you will probably need new glasses.  SEEK MEDICAL CARE IF:   You have increased bruising around your eye.   You have discomfort not helped by medicine.  SEEK IMMEDIATE MEDICAL CARE IF:   You have a fever.   You have a worsening or sudden vision loss.   You have redness, swelling, or increasing pain in the eye.   You have a thick discharge from the eye that had surgery.  MAKE SURE YOU:  Understand these instructions.   Will watch your condition.   Will get help right away if you are not doing well or get worse.  Document Released: 10/04/2004 Document Revised: 03/06/2011 Document Reviewed: 11/08/2010 [facility] Patient Information 2012 [place].

## 2013-07-05 ENCOUNTER — Encounter (HOSPITAL_COMMUNITY): Payer: Self-pay

## 2013-07-05 ENCOUNTER — Encounter (HOSPITAL_COMMUNITY)
Admission: RE | Admit: 2013-07-05 | Discharge: 2013-07-05 | Disposition: A | Discharge status: Home / Self Care | Payer: Medicare Other | Source: Ambulatory Visit | Attending: Ophthalmology | Admitting: Ophthalmology

## 2013-07-05 DIAGNOSIS — Z01818 Encounter for other preprocedural examination: Secondary | ICD-10-CM | POA: Insufficient documentation

## 2013-07-08 MED ORDER — TETRACAINE HCL 0.5 % OP SOLN
OPHTHALMIC | Status: AC
  Filled 2013-07-08: qty 2

## 2013-07-08 MED ORDER — PHENYLEPHRINE HCL 2.5 % OP SOLN
OPHTHALMIC | Status: AC
  Filled 2013-07-08: qty 15

## 2013-07-08 MED ORDER — NEOMYCIN-POLYMYXIN-DEXAMETH 3.5-10000-0.1 OP SUSP
OPHTHALMIC | Status: AC
  Filled 2013-07-08: qty 5

## 2013-07-08 MED ORDER — LIDOCAINE HCL 3.5 % OP GEL
OPHTHALMIC | Status: AC
  Filled 2013-07-08: qty 1

## 2013-07-08 MED ORDER — LIDOCAINE HCL (PF) 1 % IJ SOLN
INTRAMUSCULAR | Status: AC
  Filled 2013-07-08: qty 2

## 2013-07-08 MED ORDER — CYCLOPENTOLATE-PHENYLEPHRINE OP SOLN OPTIME - NO CHARGE
OPHTHALMIC | Status: AC
  Filled 2013-07-08: qty 2

## 2013-07-11 ENCOUNTER — Ambulatory Visit (HOSPITAL_COMMUNITY): Payer: Medicare Other | Admitting: Anesthesiology

## 2013-07-11 ENCOUNTER — Encounter (HOSPITAL_COMMUNITY): Payer: Medicare Other | Admitting: Anesthesiology

## 2013-07-11 ENCOUNTER — Encounter (HOSPITAL_COMMUNITY)
Admission: RE | Disposition: A | Discharge status: Home / Self Care | Payer: Self-pay | Source: Ambulatory Visit | Attending: Ophthalmology

## 2013-07-11 ENCOUNTER — Ambulatory Visit (HOSPITAL_COMMUNITY)
Admission: RE | Admit: 2013-07-11 | Discharge: 2013-07-11 | Disposition: A | Discharge status: Home / Self Care | Payer: Medicare Other | Source: Ambulatory Visit | Attending: Ophthalmology | Admitting: Ophthalmology

## 2013-07-11 ENCOUNTER — Encounter (HOSPITAL_COMMUNITY): Payer: Self-pay | Admitting: *Deleted

## 2013-07-11 DIAGNOSIS — K219 Gastro-esophageal reflux disease without esophagitis: Secondary | ICD-10-CM | POA: Insufficient documentation

## 2013-07-11 DIAGNOSIS — H2589 Other age-related cataract: Secondary | ICD-10-CM | POA: Insufficient documentation

## 2013-07-11 DIAGNOSIS — Z79899 Other long term (current) drug therapy: Secondary | ICD-10-CM | POA: Insufficient documentation

## 2013-07-11 HISTORY — PX: CATARACT EXTRACTION W/PHACO: SHX586

## 2013-07-11 SURGERY — PHACOEMULSIFICATION, CATARACT, WITH IOL INSERTION
Anesthesia: Monitor Anesthesia Care | Site: Eye | Laterality: Right

## 2013-07-11 MED ORDER — LACTATED RINGERS IV SOLN
INTRAVENOUS | Status: DC
  Administered 2013-07-11: 14:00:00 via INTRAVENOUS

## 2013-07-11 MED ORDER — POVIDONE-IODINE 5 % OP SOLN
OPHTHALMIC | Status: DC | PRN
  Administered 2013-07-11: 1 via OPHTHALMIC

## 2013-07-11 MED ORDER — PHENYLEPHRINE HCL 2.5 % OP SOLN
1.0000 [drp] | OPHTHALMIC | Status: AC
  Administered 2013-07-11 (×3): 1 [drp] via OPHTHALMIC

## 2013-07-11 MED ORDER — LIDOCAINE HCL (PF) 1 % IJ SOLN
INTRAMUSCULAR | Status: DC | PRN
  Administered 2013-07-11: .5 mL

## 2013-07-11 MED ORDER — NEOMYCIN-POLYMYXIN-DEXAMETH 3.5-10000-0.1 OP SUSP
OPHTHALMIC | Status: DC | PRN
  Administered 2013-07-11: 2 [drp] via OPHTHALMIC

## 2013-07-11 MED ORDER — PROVISC 10 MG/ML IO SOLN
INTRAOCULAR | Status: DC | PRN
  Administered 2013-07-11: 0.85 mL via INTRAOCULAR

## 2013-07-11 MED ORDER — FENTANYL CITRATE 0.05 MG/ML IJ SOLN
INTRAMUSCULAR | Status: AC
  Filled 2013-07-11: qty 2

## 2013-07-11 MED ORDER — FENTANYL CITRATE 0.05 MG/ML IJ SOLN
25.0000 ug | INTRAMUSCULAR | Status: AC
  Administered 2013-07-11: 25 ug via INTRAVENOUS

## 2013-07-11 MED ORDER — BSS IO SOLN
INTRAOCULAR | Status: DC | PRN
  Administered 2013-07-11: 15 mL via INTRAOCULAR

## 2013-07-11 MED ORDER — TETRACAINE HCL 0.5 % OP SOLN
1.0000 [drp] | OPHTHALMIC | Status: AC
  Administered 2013-07-11 (×3): 1 [drp] via OPHTHALMIC

## 2013-07-11 MED ORDER — MIDAZOLAM HCL 2 MG/2ML IJ SOLN
INTRAMUSCULAR | Status: AC
  Filled 2013-07-11: qty 2

## 2013-07-11 MED ORDER — LIDOCAINE HCL 3.5 % OP GEL
1.0000 "application " | Freq: Once | OPHTHALMIC | Status: AC
  Administered 2013-07-11: 1 via OPHTHALMIC

## 2013-07-11 MED ORDER — CYCLOPENTOLATE-PHENYLEPHRINE 0.2-1 % OP SOLN
1.0000 [drp] | OPHTHALMIC | Status: AC
  Administered 2013-07-11 (×3): 1 [drp] via OPHTHALMIC

## 2013-07-11 MED ORDER — EPINEPHRINE HCL 1 MG/ML IJ SOLN
INTRAMUSCULAR | Status: AC
  Filled 2013-07-11: qty 1

## 2013-07-11 MED ORDER — EPINEPHRINE HCL 1 MG/ML IJ SOLN
INTRAOCULAR | Status: DC | PRN
  Administered 2013-07-11: 14:00:00

## 2013-07-11 MED ORDER — MIDAZOLAM HCL 2 MG/2ML IJ SOLN
1.0000 mg | INTRAMUSCULAR | Status: DC | PRN
  Administered 2013-07-11 (×2): 1 mg via INTRAVENOUS

## 2013-07-11 SURGICAL SUPPLY — 32 items

## 2013-07-11 NOTE — Discharge Instructions (Signed)

## 2013-07-11 NOTE — Anesthesia Postprocedure Evaluation (Signed)
  Anesthesia Post-op Note  Patient: Grace [name]  Procedure(s) Performed: Procedure(s) with comments: CATARACT EXTRACTION PHACO AND INTRAOCULAR LENS PLACEMENT (IOC) (Right) - CDE 9.99  Patient Location: Short Stay  Anesthesia Type:MAC  Level of Consciousness: awake, alert , oriented and patient cooperative  Airway and Oxygen Therapy: Patient Spontanous Breathing  Post-op Pain: none  Post-op Assessment: Post-op Vital signs reviewed, Patient's Cardiovascular Status Stable and Pain level controlled  Post-op Vital Signs: Reviewed and stable  Last Vitals:  Filed Vitals:   07/11/13 1253  BP: 136/70  Pulse: 69  Temp: 36.7 C  Resp: 18    Complications: No apparent anesthesia complications

## 2013-07-11 NOTE — H&P (Signed)
I have reviewed the H&P, the patient was re-examined, and I have identified no interval changes in medical condition and plan of care since the history and physical of record  

## 2013-07-11 NOTE — Transfer of Care (Signed)
Immediate Anesthesia Transfer of Care Note  Patient: Grace [name]  Procedure(s) Performed: Procedure(s) with comments: CATARACT EXTRACTION PHACO AND INTRAOCULAR LENS PLACEMENT (IOC) (Right) - CDE 9.99  Patient Location: Short Stay  Anesthesia Type:MAC  Level of Consciousness: awake, alert , oriented and patient cooperative  Airway & Oxygen Therapy: Patient Spontanous Breathing  Post-op Assessment: Report given to PACU RN and Post -op Vital signs reviewed and stable  Post vital signs: Reviewed and stable  Complications: No apparent anesthesia complications

## 2013-07-11 NOTE — Op Note (Signed)
Date of Admission: 07/11/2013  Date of Surgery: 07/11/2013   Pre-Op Dx: Cataract Right Eye  Post-Op Dx: Combined Cataract Right  Eye,  Dx Code 366.19  Surgeon: Tonny Branch, M.D.  Assistants: None  Anesthesia: Topical with MAC  Indications: Painless, progressive loss of vision with compromise of daily activities.  Surgery: Cataract Extraction with Intraocular lens Implant Right Eye  Discription: The patient had dilating drops and viscous lidocaine placed into the Right eye in the pre-op holding area. After transfer to the operating room, a time out was performed. The patient was then prepped and draped. Beginning with a 57 degree blade a paracentesis port was made at the surgeon's 2 o'clock position. The anterior chamber was then filled with 1% non-preserved lidocaine. This was followed by filling the anterior chamber with Provisc.  A 2.60mm keratome blade was used to make a clear corneal incision at the temporal limbus.  A bent cystatome needle was used to create a continuous tear capsulotomy. Hydrodissection was performed with balanced salt solution on a Fine canula. The lens nucleus was then removed using the phacoemulsification handpiece. Residual cortex was removed with the I&A handpiece. The anterior chamber and capsular bag were refilled with Provisc. A posterior chamber intraocular lens was placed into the capsular bag with it's injector. The implant was positioned with the Kuglan hook. The Provisc was then removed from the anterior chamber and capsular bag with the I&A handpiece. Stromal hydration of the main incision and paracentesis port was performed with BSS on a Fine canula. The wounds were tested for leak which was negative. The patient tolerated the procedure well. There were no operative complications. The patient was then transferred to the recovery room in stable condition.  Complications: None  Specimen: None  EBL: None  Prosthetic device: Hoya iSert 250, power 20.5 D, SN  [id number].

## 2013-07-11 NOTE — Anesthesia Preprocedure Evaluation (Addendum)
Anesthesia Evaluation  Patient identified by MRN, date of birth, ID band Patient awake    Reviewed: Allergy & Precautions, H&P , NPO status , Patient's Chart, lab work & pertinent test results  History of Anesthesia Complications Negative for: history of anesthetic complications  Airway Mallampati: II TM Distance: >3 FB     Dental  (+) Teeth Intact, Implants   Pulmonary  breath sounds clear to auscultation        Cardiovascular negative cardio ROS  Rhythm:Regular Rate:Normal     Neuro/Psych  Headaches, TIA   GI/Hepatic GERD-  Medicated and Controlled,  Endo/Other  Hypothyroidism   Renal/GU      Musculoskeletal   Abdominal   Peds  Hematology   Anesthesia Other Findings   Reproductive/Obstetrics                          Anesthesia Physical Anesthesia Plan  ASA: II  Anesthesia Plan: MAC   Post-op Pain Management:    Induction: Intravenous  Airway Management Planned: Nasal Cannula  Additional Equipment:   Intra-op Plan:   Post-operative Plan:   Informed Consent: I have reviewed the patients History and Physical, chart, labs and discussed the procedure including the risks, benefits and alternatives for the proposed anesthesia with the patient or authorized representative who has indicated his/her understanding and acceptance.     Plan Discussed with:   Anesthesia Plan Comments:         Anesthesia Quick Evaluation

## 2013-07-13 ENCOUNTER — Encounter (HOSPITAL_COMMUNITY): Payer: Self-pay | Admitting: Ophthalmology

## 2013-07-18 ENCOUNTER — Encounter: Payer: Self-pay | Admitting: Family Medicine

## 2013-07-18 ENCOUNTER — Ambulatory Visit (INDEPENDENT_AMBULATORY_CARE_PROVIDER_SITE_OTHER): Payer: 59 | Admitting: Family Medicine

## 2013-07-18 VITALS — BP 122/78 | Ht 62.0 in | Wt 132.0 lb

## 2013-07-18 DIAGNOSIS — K219 Gastro-esophageal reflux disease without esophagitis: Secondary | ICD-10-CM

## 2013-07-18 DIAGNOSIS — M25512 Pain in left shoulder: Secondary | ICD-10-CM

## 2013-07-18 DIAGNOSIS — M25551 Pain in right hip: Secondary | ICD-10-CM

## 2013-07-18 DIAGNOSIS — R634 Abnormal weight loss: Secondary | ICD-10-CM

## 2013-07-18 DIAGNOSIS — M25559 Pain in unspecified hip: Secondary | ICD-10-CM

## 2013-07-18 DIAGNOSIS — M25519 Pain in unspecified shoulder: Secondary | ICD-10-CM

## 2013-07-18 NOTE — Progress Notes (Signed)
   Subjective:    Patient ID: Grace [name], female    DOB: Apr 11, 1945, 68 y.o.   MRN: [id number]  HPI Patient arrives for follow up on osteoporosis. Patient also reports pain in right hip and left shoulder.  Patient also concerned about weight loss. Patient states she does try to be healthy. She states recently she has noted that when she eats she quickly gets full. she finds herself not wanting need throughout the day she denies being depressed. Patient also relates intermittent right hip pain over the past couple months. Comes and goes sometimes radiates down the side of her thigh does not get beyond the knee doesn't seem to vary depending on position she also relates intermittent left shoulder pain. She denies any particular pain or tenderness with that. Just more of a soreness when she sleeps on denies any injury Patient denies rectal bleeding. Review of Systems  Constitutional: Negative for activity change, appetite change and fatigue.  HENT: Negative for congestion.   Respiratory: Negative for cough and choking.   Cardiovascular: Negative for chest pain.  Gastrointestinal: Negative for nausea and vomiting.  Endocrine: Negative for polydipsia and polyphagia.  Genitourinary: Negative for frequency.  Musculoskeletal: Positive for arthralgias and back pain.  Neurological: Negative for weakness.  Psychiatric/Behavioral: Negative for confusion.       Objective:   Physical Exam  Her lungs clear hearts regular pulse normal blood pressure is good abdomen soft extremities no edema skin warm dry      Assessment & Plan:  #1 osteoporosis continue current medication it is possible that this could be contributing to her early satiety. She will need to see Dr. Laural Golden. Probably will end up needing to have an endoscopy. May have to change her medicine from Actonel to IV medicine  #2 shoulder discomfort probably mild tendinosis range of motion exercises shown Tylenol for pain  #3 hip  pain-stretches were shown if ongoing troubles followup no x-rays today  #4 weight loss-see Dr. Laural Golden they would do evaluation may need EGD. Her lab work has looked good. Patient states whenever she she gets full quickly and doesn't feel like eating much she denies being depressed

## 2013-07-19 ENCOUNTER — Telehealth: Payer: Self-pay | Admitting: Family Medicine

## 2013-07-19 NOTE — Telephone Encounter (Signed)
error 

## 2013-07-22 ENCOUNTER — Encounter (HOSPITAL_COMMUNITY): Payer: Self-pay | Admitting: Pharmacy Technician

## 2013-07-27 ENCOUNTER — Encounter (INDEPENDENT_AMBULATORY_CARE_PROVIDER_SITE_OTHER): Payer: Self-pay | Admitting: *Deleted

## 2013-07-27 ENCOUNTER — Encounter (INDEPENDENT_AMBULATORY_CARE_PROVIDER_SITE_OTHER): Payer: Self-pay | Admitting: Internal Medicine

## 2013-07-27 ENCOUNTER — Ambulatory Visit (INDEPENDENT_AMBULATORY_CARE_PROVIDER_SITE_OTHER): Payer: Medicare Other | Admitting: Internal Medicine

## 2013-07-27 ENCOUNTER — Other Ambulatory Visit (INDEPENDENT_AMBULATORY_CARE_PROVIDER_SITE_OTHER): Payer: Self-pay | Admitting: *Deleted

## 2013-07-27 VITALS — BP 126/58 | HR 64 | Temp 98.4°F | Ht 63.0 in | Wt 134.1 lb

## 2013-07-27 DIAGNOSIS — R6881 Early satiety: Secondary | ICD-10-CM | POA: Insufficient documentation

## 2013-07-27 DIAGNOSIS — R1310 Dysphagia, unspecified: Secondary | ICD-10-CM | POA: Insufficient documentation

## 2013-07-27 DIAGNOSIS — R634 Abnormal weight loss: Secondary | ICD-10-CM

## 2013-07-27 MED ORDER — FENTANYL CITRATE 0.05 MG/ML IJ SOLN: 25.0000 ug | INTRAMUSCULAR | Status: DC | PRN

## 2013-07-27 MED ORDER — ONDANSETRON HCL 4 MG/2ML IJ SOLN: 4.0000 mg | Freq: Once | INTRAMUSCULAR | Status: AC | PRN

## 2013-07-27 NOTE — Progress Notes (Signed)
Subjective:     Patient ID: Grace [name], female   DOB: 20-Apr-1945, 68 y.o.   MRN: [id number]  HPI Presents today with c/o of weight loss. She has early satiety. She feels full. She says she just does not feel hungry. She has lost 10 pounds over the past few months.  There is no abdominal pain.  She does have some acid reflux occasionally.  She does have some dysphagia. Foods are slow to go down. Occurs about every day.  She can eat 2-3 spoonfuls and she feels full.   05/05/2013 L TSH 1.34  Last colonoscopy in 2012; Dr Laural Golden: Examination performed to cecum.  5 small polyps ablated via cold biopsy; 2 from a sending colon and submitted in one container; 2 from transverse colon and submitted in separate containers and one from descending colon.  All 6 polyps are tubular adenomas they were all small. Next colonoscopy in 5 years.    08/13/2004 EGD/Colonoscopy:  FINAL DIAGNOSIS: Esophageal valve which was disrupted by passing 56-French  Maloney dilator. Three small gastric polyps at body which ablated via cold  biopsy. Nonerosive antral gastritis. Two small polyps snared from the  transverse colon and third much smaller polyp was coagulated also at  transverse colon.  CBC    Component Value Date/Time   WBC 7.2 05/05/2013 1209   RBC 4.55 05/05/2013 1209   HGB 13.8 05/05/2013 1209   HCT 40.5 05/05/2013 1209   PLT 200 05/05/2013 1209   MCV 89.0 05/05/2013 1209   MCH 30.3 05/05/2013 1209   MCHC 34.1 05/05/2013 1209   RDW 13.2 05/05/2013 1209   LYMPHSABS 1.8 05/05/2013 1209   MONOABS 0.6 05/05/2013 1209   EOSABS 0.1 05/05/2013 1209   BASOSABS 0.0 05/05/2013 1209   CMP     Component Value Date/Time   NA 138 05/05/2013 1209   K 3.7 05/05/2013 1209   CL 102 05/05/2013 1209   CO2 30 05/05/2013 1209   GLUCOSE 74 05/05/2013 1209   BUN 13 05/05/2013 1209   CREATININE 0.84 05/05/2013 1209   CREATININE 0.90 03/08/2012 0723   CALCIUM 8.9 05/05/2013 1209   PROT 6.8 05/05/2013 1209   ALBUMIN 4.1 05/05/2013 1209   AST 21 05/05/2013  1209   ALT 19 05/05/2013 1209   ALKPHOS 60 05/05/2013 1209   BILITOT 1.1 05/05/2013 1209   GFRNONAA 89* 02/19/2012 2121   GFRAA >90 02/19/2012 2121       Review of Systems Past Medical History  Diagnosis Date  . Hypothyroid   . Hyperlipidemia     Diet controlled  . Family history of anesthesia complication     Mother was hard to wake up after anesthesia  . Heart murmur   . Mitral valve prolapse   . Frequency of urination   . Dry eyes, bilateral   . Cancer     Skin Cancer, Squamous Cell  . TIA (transient ischemic attack) 02/19/2012  . Diastolic dysfunction 85/63/1497    Ejection fraction 75%.  . Migraine 2013    Past Surgical History  Procedure Laterality Date  . Tubal ligation      38 yrs ago.  . Colonoscopy  12/18/2010    Procedure: COLONOSCOPY;  Surgeon: Rogene Houston, MD;  [facility]: [facility];  Service: Endoscopy;  Laterality: N/A;  1:00 pm  . Breast biopsy      left benign tumor  . Cholecystectomy    . Thyroidectomy  11/20/2011    Procedure: THYROIDECTOMY;  Surgeon: Izora Gala,  MD;  Location: [facility];  Service: ENT;  Laterality: Left;  LEFT THYROID LOBECTOMY  . Esophagogastroduodenoscopy    . Cataract extraction w/phaco Right 07/11/2013    Procedure: CATARACT EXTRACTION PHACO AND INTRAOCULAR LENS PLACEMENT (IOC);  Surgeon: Tonny Branch, MD;  Location: [facility];  Service: Ophthalmology;  Laterality: Right;  CDE 9.99    Allergies  Allergen Reactions  . Compazine     Slurred speech, unsure of other reaction  . Sulfa Drugs Cross Reactors     rash    Current Outpatient Prescriptions on File Prior to Visit  Medication Sig Dispense Refill  . calcium carbonate (OS-CAL) 600 MG TABS tablet Take 600 mg by mouth daily with breakfast.      . levothyroxine (SYNTHROID, LEVOTHROID) 75 MCG tablet TAKE 1/2 TABLET BY MOUTH ON MON AND FRI. TAKE 1 TABLET ONE TUES, WED,THUR, SAT, AND SUN.  24 tablet  5  . Multiple Vitamins-Minerals (ALIVE WOMENS 50+) TABS Take 1 tablet by mouth every  evening.       . risedronate (ACTONEL) 35 MG tablet Take 1 tablet (35 mg total) by mouth every 7 (seven) days. with water on empty stomach, nothing by mouth or lie down for next 30 minutes.  4 tablet  12  . vitamin E 400 UNIT capsule Take 400 Units by mouth daily.      . cycloSPORINE (RESTASIS) 0.05 % ophthalmic emulsion Place 1 drop into both eyes 2 (two) times daily.       No current facility-administered medications on file prior to visit.        Objective:   Physical Exam  Filed Vitals:   07/27/13 1042  BP: 126/58  Pulse: 64  Temp: 98.4 F (36.9 C)  Height: 5\' 3"  (1.6 m)  Weight: 134 lb 1.6 oz (60.827 kg)   Alert and oriented. Skin warm and dry. Oral mucosa is moist.   . Sclera anicteric, conjunctivae is pink. Thyroid not enlarged. No cervical lymphadenopathy. Lungs clear. Heart regular rate and rhythm.  Abdomen is soft. Bowel sounds are positive. No hepatomegaly. No abdominal masses felt. No tenderness.  No edema to lower extremities. Rectal deferred.     Assessment:     Weight loss, early satiety.  PUD needs to be ruled out. Dysphagia. Esopahgeal stricture needs to be ruled  out     Plan:    EGD/ED

## 2013-07-27 NOTE — Patient Instructions (Signed)
EGD/ED. The risks and benefits such as perforation, bleeding, and infection were reviewed with the patient and is agreeable. 

## 2013-07-28 ENCOUNTER — Ambulatory Visit (HOSPITAL_COMMUNITY)
Admission: RE | Admit: 2013-07-28 | Discharge: 2013-07-28 | Disposition: A | Discharge status: Home / Self Care | Payer: Medicare Other | Source: Ambulatory Visit | Attending: Internal Medicine | Admitting: Internal Medicine

## 2013-07-28 ENCOUNTER — Telehealth: Payer: Self-pay

## 2013-07-28 ENCOUNTER — Encounter (HOSPITAL_COMMUNITY): Payer: Self-pay | Admitting: *Deleted

## 2013-07-28 ENCOUNTER — Encounter (HOSPITAL_COMMUNITY)
Admission: RE | Disposition: A | Discharge status: Home / Self Care | Payer: Self-pay | Source: Ambulatory Visit | Attending: Internal Medicine

## 2013-07-28 ENCOUNTER — Encounter (HOSPITAL_COMMUNITY)
Admission: RE | Admit: 2013-07-28 | Discharge: 2013-07-28 | Disposition: A | Discharge status: Home / Self Care | Payer: Medicare Other | Source: Ambulatory Visit | Attending: Ophthalmology | Admitting: Ophthalmology

## 2013-07-28 DIAGNOSIS — R1310 Dysphagia, unspecified: Secondary | ICD-10-CM

## 2013-07-28 DIAGNOSIS — R110 Nausea: Secondary | ICD-10-CM

## 2013-07-28 DIAGNOSIS — R6881 Early satiety: Secondary | ICD-10-CM | POA: Insufficient documentation

## 2013-07-28 DIAGNOSIS — E785 Hyperlipidemia, unspecified: Secondary | ICD-10-CM | POA: Insufficient documentation

## 2013-07-28 DIAGNOSIS — E039 Hypothyroidism, unspecified: Secondary | ICD-10-CM | POA: Insufficient documentation

## 2013-07-28 DIAGNOSIS — Z79899 Other long term (current) drug therapy: Secondary | ICD-10-CM | POA: Insufficient documentation

## 2013-07-28 DIAGNOSIS — Z888 Allergy status to other drugs, medicaments and biological substances status: Secondary | ICD-10-CM | POA: Insufficient documentation

## 2013-07-28 DIAGNOSIS — D131 Benign neoplasm of stomach: Secondary | ICD-10-CM | POA: Insufficient documentation

## 2013-07-28 DIAGNOSIS — Z882 Allergy status to sulfonamides status: Secondary | ICD-10-CM | POA: Insufficient documentation

## 2013-07-28 DIAGNOSIS — K2960 Other gastritis without bleeding: Secondary | ICD-10-CM

## 2013-07-28 DIAGNOSIS — R634 Abnormal weight loss: Secondary | ICD-10-CM | POA: Insufficient documentation

## 2013-07-28 DIAGNOSIS — I059 Rheumatic mitral valve disease, unspecified: Secondary | ICD-10-CM | POA: Insufficient documentation

## 2013-07-28 HISTORY — PX: SAVORY DILATION: SHX5439

## 2013-07-28 HISTORY — PX: ESOPHAGOGASTRODUODENOSCOPY: SHX5428

## 2013-07-28 HISTORY — PX: MALONEY DILATION: SHX5535

## 2013-07-28 HISTORY — PX: BALLOON DILATION: SHX5330

## 2013-07-28 SURGERY — EGD (ESOPHAGOGASTRODUODENOSCOPY)
Anesthesia: Moderate Sedation

## 2013-07-28 MED ORDER — STERILE WATER FOR IRRIGATION IR SOLN
Status: DC | PRN
  Administered 2013-07-28: 14:00:00

## 2013-07-28 MED ORDER — BUTAMBEN-TETRACAINE-BENZOCAINE 2-2-14 % EX AERO
INHALATION_SPRAY | CUTANEOUS | Status: DC | PRN
Start: 2013-07-28 — End: 2013-07-28
  Administered 2013-07-28: 2 via TOPICAL

## 2013-07-28 MED ORDER — MEPERIDINE HCL 50 MG/ML IJ SOLN
INTRAMUSCULAR | Status: DC | PRN
  Administered 2013-07-28 (×2): 25 mg via INTRAVENOUS

## 2013-07-28 MED ORDER — SODIUM CHLORIDE 0.9 % IV SOLN
INTRAVENOUS | Status: DC
  Administered 2013-07-28: 13:00:00 via INTRAVENOUS

## 2013-07-28 MED ORDER — MIDAZOLAM HCL 5 MG/5ML IJ SOLN
INTRAMUSCULAR | Status: DC | PRN
  Administered 2013-07-28: 1 mg via INTRAVENOUS
  Administered 2013-07-28 (×3): 2 mg via INTRAVENOUS

## 2013-07-28 MED ORDER — MEPERIDINE HCL 50 MG/ML IJ SOLN
INTRAMUSCULAR | Status: AC
  Filled 2013-07-28: qty 1

## 2013-07-28 MED ORDER — MIDAZOLAM HCL 5 MG/5ML IJ SOLN
INTRAMUSCULAR | Status: AC
  Filled 2013-07-28: qty 10

## 2013-07-28 NOTE — Discharge Instructions (Signed)
Discontinue Actonel or risedronate resume other medications as before. Resume usual diet. No driving for 24 hours. Physician will call with biopsy results. Abdominopelvic CT to be scheduled. Office will call. Is scheduled an appointment with Dr. Sallee Lange to discuss other treatment options for osteoporosis  Gastrointestinal Endoscopy Care After Refer to this sheet in the next few weeks. These instructions provide you with information on caring for yourself after your procedure. Your caregiver may also give you more specific instructions. Your treatment has been planned according to current medical practices, but problems sometimes occur. Call your caregiver if you have any problems or questions after your procedure. HOME CARE INSTRUCTIONS  If you were given medicine to help you relax (sedative), do not drive, operate machinery, or sign important documents for 24 hours.  Avoid alcohol and hot or warm beverages for the first 24 hours after the procedure.  Only take over-the-counter or prescription medicines for pain, discomfort, or fever as directed by your caregiver. You may resume taking your normal medicines unless your caregiver tells you otherwise. Ask your caregiver when you may resume taking medicines that may cause bleeding, such as aspirin, clopidogrel, or warfarin.  You may return to your normal diet and activities on the day after your procedure, or as directed by your caregiver. Walking may help to reduce any bloated feeling in your abdomen.  Drink enough fluids to keep your urine clear or pale yellow.  You may gargle with salt water if you have a sore throat. SEEK IMMEDIATE MEDICAL CARE IF:  You have severe nausea or vomiting.  You have severe abdominal pain, abdominal cramps that last longer than 6 hours, or abdominal swelling (distention).  You have severe shoulder or back pain.  You have trouble swallowing.  You have shortness of breath, your breathing is shallow, or  you are breathing faster than normal.  You have a fever or a rapid heartbeat.  You vomit blood or material that looks like coffee grounds.  You have bloody, black, or tarry stools. MAKE SURE YOU:  Understand these instructions.  Will watch your condition.  Will get help right away if you are not doing well or get worse. Document Released: 10/30/2003 Document Revised: 09/16/2011 Document Reviewed: 06/17/2011 [facility] Patient Information 2014 [place], [place].

## 2013-07-28 NOTE — H&P (Signed)
Grace [name] is an 68 y.o. female.   Chief Complaint: Patient is here for EGD. HPI: Patient is 68 year old Caucasian female who presents with 3-4 month history of anorexia and weight loss. She also has had few episodes of nausea but no vomiting. She was on Fosamax for osteoporosis but this was discontinued because of loss of appetite. However stopping the medication has not helped. She has been on Actonel for 5-6 weeks and does not believe she is having any side effects. She's having night sweats but she denies fever abdominal pain melena or rectal bleeding. She does not take NSAIDs. She does not smoke cigarettes or drink alcohol. She had normal CBC, LFTs and TSH in February 2015.  Past Medical History  Diagnosis Date  . Hypothyroid   . Hyperlipidemia     Diet controlled  . Family history of anesthesia complication     Mother was hard to wake up after anesthesia  . Heart murmur   . Mitral valve prolapse   . Frequency of urination   . Dry eyes, bilateral   . Cancer     Skin Cancer, Squamous Cell  . TIA (transient ischemic attack) 02/19/2012  . Diastolic dysfunction 16/09/3708    Ejection fraction 75%.  . Migraine 2013    Past Surgical History  Procedure Laterality Date  . Tubal ligation      38 yrs ago.  . Colonoscopy  12/18/2010    Procedure: COLONOSCOPY;  Surgeon: Rogene Houston, MD;  [facility];  Service: Endoscopy;  Laterality: N/A;  1:00 pm  . Breast biopsy      left benign tumor  . Cholecystectomy    . Thyroidectomy  11/20/2011    Procedure: THYROIDECTOMY;  Surgeon: Izora Gala, MD;  Location: [facility];  Service: ENT;  Laterality: Left;  LEFT THYROID LOBECTOMY  . Esophagogastroduodenoscopy    . Cataract extraction w/phaco Right 07/11/2013    Procedure: CATARACT EXTRACTION PHACO AND INTRAOCULAR LENS PLACEMENT (IOC);  Surgeon: Tonny Branch, MD;  Location: [facility];  Service: Ophthalmology;  Laterality: Right;  CDE 9.99    Family History  Problem Relation Age of Onset   . Heart disease Mother   . Hypertension Mother    Social History:  reports that she has never smoked. She has never used smokeless tobacco. She reports that she does not drink alcohol or use illicit drugs.  Allergies:  Allergies  Allergen Reactions  . Compazine     Slurred speech, unsure of other reaction  . Sulfa Drugs Cross Reactors     rash    Medications Prior to Admission  Medication Sig Dispense Refill  . calcium carbonate (OS-CAL) 600 MG TABS tablet Take 600 mg by mouth daily with breakfast.      . cycloSPORINE (RESTASIS) 0.05 % ophthalmic emulsion Place 1 drop into both eyes 2 (two) times daily.      Marland Kitchen levothyroxine (SYNTHROID, LEVOTHROID) 75 MCG tablet TAKE 1/2 TABLET BY MOUTH ON MON AND FRI. TAKE 1 TABLET ONE TUES, WED,THUR, SAT, AND SUN.  24 tablet  5  . Multiple Vitamins-Minerals (ALIVE WOMENS 50+) TABS Take 1 tablet by mouth every evening.       . risedronate (ACTONEL) 35 MG tablet Take 1 tablet (35 mg total) by mouth every 7 (seven) days. with water on empty stomach, nothing by mouth or lie down for next 30 minutes.  4 tablet  12  . vitamin E 400 UNIT capsule Take 400 Units by mouth daily.  No results found for this or any previous visit (from the past 48 hour(s)). No results found.  ROS  Blood pressure 136/66, pulse 68, temperature 97.8 F (36.6 C), temperature source Oral, resp. rate 20, height 5\' 3"  (1.6 m), weight 134 lb (60.782 kg), SpO2 99.00%. Physical Exam  Constitutional: She appears well-developed and well-nourished.  HENT:  Mouth/Throat: Oropharynx is clear and moist.  Eyes: Conjunctivae are normal. No scleral icterus.  Neck: No thyromegaly present.  Cardiovascular: Normal rate, regular rhythm and normal heart sounds.   No murmur heard. Respiratory: Effort normal and breath sounds normal.  GI: Soft. She exhibits no distension and no mass. There is no tenderness.  Musculoskeletal: She exhibits no edema.  Lymphadenopathy:    She has no  cervical adenopathy.  Neurological: She is alert.  Skin: Skin is warm and dry.     Assessment/Plan Nausea early satiety and weight loss. Diagnostic EGD.  Najeeb U Rehman 07/28/2013, 2:06 PM

## 2013-07-28 NOTE — Telephone Encounter (Signed)
Dr. Laural Golden called about this patient. He states that the patient does not have a hiatal ring or obstruction. He recommends that she come off of the oral osteoporosis medicine and go with IV osteoporosis medicine. He instructed the patient followup with [place] to discuss this.

## 2013-07-28 NOTE — Telephone Encounter (Signed)
Spoke with Dr. Laural Golden concerning patient.

## 2013-07-28 NOTE — Patient Instructions (Signed)
Your procedure is scheduled on:  08/04/2013  Report to [facility] at  39   AM.  Call this number if you have problems the morning of surgery: [phone]   Do not eat food or drink liquids :After Midnight.      Take these medicines the morning of surgery with A SIP OF WATER: levothyroxine   Do not wear jewelry, make-up or nail polish.  Do not wear lotions, powders, or perfumes.   Do not shave 48 hours prior to surgery.  Do not bring valuables to the hospital.  Contacts, dentures or bridgework may not be worn into surgery.  Leave suitcase in the car. After surgery it may be brought to your room.  For patients admitted to the hospital, checkout time is 11:00 AM the day of discharge.   Patients discharged the day of surgery will not be allowed to drive home.  :     Please read over the following fact sheets that you were given: Coughing and Deep Breathing, Surgical Site Infection Prevention, Anesthesia Post-op Instructions and Care and Recovery After Surgery    Cataract A cataract is a clouding of the lens of the eye. When a lens becomes cloudy, vision is reduced based on the degree and nature of the clouding. Many cataracts reduce vision to some degree. Some cataracts make people more near-sighted as they develop. Other cataracts increase glare. Cataracts that are ignored and become worse can sometimes look white. The white color can be seen through the pupil. CAUSES   Aging. However, cataracts may occur at any age, even in newborns.   Certain drugs.   Trauma to the eye.   Certain diseases such as diabetes.   Specific eye diseases such as chronic inflammation inside the eye or a sudden attack of a rare form of glaucoma.   Inherited or acquired medical problems.  SYMPTOMS   Gradual, progressive drop in vision in the affected eye.   Severe, rapid visual loss. This most often happens when trauma is the cause.  DIAGNOSIS  To detect a cataract, an eye doctor examines the lens.  Cataracts are best diagnosed with an exam of the eyes with the pupils enlarged (dilated) by drops.  TREATMENT  For an early cataract, vision may improve by using different eyeglasses or stronger lighting. If that does not help your vision, surgery is the only effective treatment. A cataract needs to be surgically removed when vision loss interferes with your everyday activities, such as driving, reading, or watching TV. A cataract may also have to be removed if it prevents examination or treatment of another eye problem. Surgery removes the cloudy lens and usually replaces it with a substitute lens (intraocular lens, IOL).  At a time when both you and your doctor agree, the cataract will be surgically removed. If you have cataracts in both eyes, only one is usually removed at a time. This allows the operated eye to heal and be out of danger from any possible problems after surgery (such as infection or poor wound healing). In rare cases, a cataract may be doing damage to your eye. In these cases, your caregiver may advise surgical removal right away. The vast majority of people who have cataract surgery have better vision afterward. HOME CARE INSTRUCTIONS  If you are not planning surgery, you may be asked to do the following:  Use different eyeglasses.   Use stronger or brighter lighting.   Ask your eye doctor about reducing your medicine dose or changing  medicines if it is thought that a medicine caused your cataract. Changing medicines does not make the cataract go away on its own.   Become familiar with your surroundings. Poor vision can lead to injury. Avoid bumping into things on the affected side. You are at a higher risk for tripping or falling.   Exercise extreme care when driving or operating machinery.   Wear sunglasses if you are sensitive to bright light or experiencing problems with glare.  SEEK IMMEDIATE MEDICAL CARE IF:   You have a worsening or sudden vision loss.   You notice  redness, swelling, or increasing pain in the eye.   You have a fever.  Document Released: 03/17/2005 Document Revised: 03/06/2011 Document Reviewed: 11/08/2010 [facility] Patient Information 2012 [place].PATIENT INSTRUCTIONS POST-ANESTHESIA  IMMEDIATELY FOLLOWING SURGERY:  Do not drive or operate machinery for the first twenty four hours after surgery.  Do not make any important decisions for twenty four hours after surgery or while taking narcotic pain medications or sedatives.  If you develop intractable nausea and vomiting or a severe headache please notify your doctor immediately.  FOLLOW-UP:  Please make an appointment with your surgeon as instructed. You do not need to follow up with anesthesia unless specifically instructed to do so.  WOUND CARE INSTRUCTIONS (if applicable):  Keep a dry clean dressing on the anesthesia/puncture wound site if there is drainage.  Once the wound has quit draining you may leave it open to air.  Generally you should leave the bandage intact for twenty four hours unless there is drainage.  If the epidural site drains for more than 36-48 hours please call the anesthesia department.  QUESTIONS?:  Please feel free to call your physician or the hospital operator if you have any questions, and they will be happy to assist you.

## 2013-07-28 NOTE — Op Note (Signed)
EGD PROCEDURE REPORT  PATIENT:  Grace [name]  MR#:  [id number] Birthdate:  18-Jul-1945, 68 y.o., female Endoscopist:  Dr. Rogene Houston, MD Referred By:  Dr. Sallee Lange, MD  Procedure Date: 07/28/2013  Procedure:   EGD  Indications:  Patient is 33 old Caucasian female who presents with 2 months history of early satiety nausea and weight loss. Routine blood work including CBC, LFTs, metabolic 7 and TSH are normal.            Informed Consent:  The risks, benefits, alternatives & imponderables which include, but are not limited to, bleeding, infection, perforation, drug reaction and potential missed lesion have been reviewed.  The potential for biopsy, lesion removal, esophageal dilation, etc. have also been discussed.  Questions have been answered.  All parties agreeable.  Please see history & physical in medical record for more information.  Medications:  Demerol 50 mg IV Versed 7 mg IV Cetacaine spray topically for oropharyngeal anesthesia  Description of procedure:  The endoscope was introduced through the mouth and advanced to the second portion of the duodenum without difficulty or limitations. The mucosal surfaces were surveyed very carefully during advancement of the scope and upon withdrawal.  Findings:  Esophagus:   mucosa of the esophagus was normal. The GE junction was unremarkable. GEJ:  36 cm Stomach:  Stomach was empty and distended very well with insufflation. Folds in the proximal stomach are normal. Examination of mucosa revealed  few scattered polyps at gastric body. The largest one was 5 mm and others were small. four of these were biopsied for routine histology. There was  patchy erythema to antral mucosa but no erosions or ulcers were noted. Pyloric channel was patent. Angularis fundus and cardia were unremarkable.  Duodenum:   normal bulbar and post bulbar mucosa.  Therapeutic/Diagnostic Maneuvers Performed:  For small polyps and gastric biopsy for ablated via cold  biopsy and submitted together.  Complications:  None   Impression: No evidence of peptic ulcer disease or pyloric stenosis. Few small polyps at gastric body possibly hypoplastic. 4 of these were biopsied. Nonerosive antral gastritis.  Recommendations:  Will discontinue Actonel as discussed with Dr. Sallee Lange. Will schedule patient for abdominopelvic CT with contrast. I will be contacting patient with results of biopsy. Patient advised to follow with Dr. Sallee Lange to discuss other treatment options for osteoporosis.  Rogene Houston  07/28/2013  2:32 PM  CC: Dr. Sallee Lange, MD & Dr. Rayne Du ref. provider found

## 2013-07-29 ENCOUNTER — Encounter (HOSPITAL_COMMUNITY): Payer: Self-pay | Admitting: Internal Medicine

## 2013-08-01 ENCOUNTER — Telehealth (INDEPENDENT_AMBULATORY_CARE_PROVIDER_SITE_OTHER): Payer: Self-pay | Admitting: *Deleted

## 2013-08-01 ENCOUNTER — Other Ambulatory Visit (INDEPENDENT_AMBULATORY_CARE_PROVIDER_SITE_OTHER): Payer: Self-pay | Admitting: Internal Medicine

## 2013-08-01 DIAGNOSIS — R112 Nausea with vomiting, unspecified: Secondary | ICD-10-CM

## 2013-08-01 DIAGNOSIS — Z0189 Encounter for other specified special examinations: Secondary | ICD-10-CM

## 2013-08-01 DIAGNOSIS — R634 Abnormal weight loss: Secondary | ICD-10-CM

## 2013-08-01 DIAGNOSIS — R6881 Early satiety: Secondary | ICD-10-CM

## 2013-08-01 NOTE — Telephone Encounter (Signed)
Per Dr.Rehman the patient will need to have labs drawn prior to Ct

## 2013-08-01 NOTE — Telephone Encounter (Signed)
Lab noted,faxed to [employer]

## 2013-08-01 NOTE — Telephone Encounter (Signed)
Patient needs creatinine order faxed to [facility] for CT next week

## 2013-08-04 ENCOUNTER — Encounter (HOSPITAL_COMMUNITY): Payer: Medicare Other | Admitting: Anesthesiology

## 2013-08-04 ENCOUNTER — Ambulatory Visit (HOSPITAL_COMMUNITY)
Admission: RE | Admit: 2013-08-04 | Discharge: 2013-08-04 | Disposition: A | Discharge status: Home / Self Care | Payer: Medicare Other | Source: Ambulatory Visit | Attending: Ophthalmology | Admitting: Ophthalmology

## 2013-08-04 ENCOUNTER — Encounter (HOSPITAL_COMMUNITY)
Admission: RE | Disposition: A | Discharge status: Home / Self Care | Payer: Self-pay | Source: Ambulatory Visit | Attending: Ophthalmology

## 2013-08-04 ENCOUNTER — Encounter (HOSPITAL_COMMUNITY): Payer: Self-pay | Admitting: *Deleted

## 2013-08-04 ENCOUNTER — Ambulatory Visit (HOSPITAL_COMMUNITY): Payer: Medicare Other | Admitting: Anesthesiology

## 2013-08-04 DIAGNOSIS — Z8673 Personal history of transient ischemic attack (TIA), and cerebral infarction without residual deficits: Secondary | ICD-10-CM | POA: Insufficient documentation

## 2013-08-04 DIAGNOSIS — Z882 Allergy status to sulfonamides status: Secondary | ICD-10-CM | POA: Insufficient documentation

## 2013-08-04 DIAGNOSIS — H2510 Age-related nuclear cataract, unspecified eye: Secondary | ICD-10-CM | POA: Insufficient documentation

## 2013-08-04 DIAGNOSIS — Z79899 Other long term (current) drug therapy: Secondary | ICD-10-CM | POA: Insufficient documentation

## 2013-08-04 DIAGNOSIS — K219 Gastro-esophageal reflux disease without esophagitis: Secondary | ICD-10-CM | POA: Insufficient documentation

## 2013-08-04 DIAGNOSIS — E039 Hypothyroidism, unspecified: Secondary | ICD-10-CM | POA: Insufficient documentation

## 2013-08-04 HISTORY — PX: CATARACT EXTRACTION W/PHACO: SHX586

## 2013-08-04 LAB — CREATININE, SERUM: Creat: 0.85 mg/dL (ref 0.50–1.10)

## 2013-08-04 SURGERY — PHACOEMULSIFICATION, CATARACT, WITH IOL INSERTION
Anesthesia: Monitor Anesthesia Care | Site: Eye | Laterality: Left

## 2013-08-04 MED ORDER — LACTATED RINGERS IV SOLN
INTRAVENOUS | Status: DC
  Administered 2013-08-04: 09:00:00 via INTRAVENOUS

## 2013-08-04 MED ORDER — FENTANYL CITRATE 0.05 MG/ML IJ SOLN
INTRAMUSCULAR | Status: AC
  Filled 2013-08-04: qty 2

## 2013-08-04 MED ORDER — PHENYLEPHRINE HCL 2.5 % OP SOLN
1.0000 [drp] | OPHTHALMIC | Status: AC
  Administered 2013-08-04 (×3): 1 [drp] via OPHTHALMIC

## 2013-08-04 MED ORDER — TETRACAINE HCL 0.5 % OP SOLN
1.0000 [drp] | OPHTHALMIC | Status: AC
  Administered 2013-08-04 (×3): 1 [drp] via OPHTHALMIC

## 2013-08-04 MED ORDER — MIDAZOLAM HCL 2 MG/2ML IJ SOLN
1.0000 mg | INTRAMUSCULAR | Status: DC | PRN
  Administered 2013-08-04: 2 mg via INTRAVENOUS

## 2013-08-04 MED ORDER — LIDOCAINE HCL 3.5 % OP GEL
OPHTHALMIC | Status: AC
  Filled 2013-08-04: qty 1

## 2013-08-04 MED ORDER — POVIDONE-IODINE 5 % OP SOLN
OPHTHALMIC | Status: DC | PRN
  Administered 2013-08-04: 1 via OPHTHALMIC

## 2013-08-04 MED ORDER — MIDAZOLAM HCL 2 MG/2ML IJ SOLN
INTRAMUSCULAR | Status: AC
  Filled 2013-08-04: qty 2

## 2013-08-04 MED ORDER — PHENYLEPHRINE HCL 2.5 % OP SOLN
OPHTHALMIC | Status: AC
  Filled 2013-08-04: qty 15

## 2013-08-04 MED ORDER — BSS IO SOLN
INTRAOCULAR | Status: DC | PRN
  Administered 2013-08-04: 09:00:00

## 2013-08-04 MED ORDER — NEOMYCIN-POLYMYXIN-DEXAMETH 3.5-10000-0.1 OP SUSP
OPHTHALMIC | Status: AC
  Filled 2013-08-04: qty 5

## 2013-08-04 MED ORDER — LIDOCAINE HCL 3.5 % OP GEL
1.0000 "application " | Freq: Once | OPHTHALMIC | Status: AC
  Administered 2013-08-04: 1 via OPHTHALMIC

## 2013-08-04 MED ORDER — LIDOCAINE HCL (PF) 1 % IJ SOLN
INTRAMUSCULAR | Status: DC | PRN
  Administered 2013-08-04: .4 mL

## 2013-08-04 MED ORDER — EPINEPHRINE HCL 1 MG/ML IJ SOLN
INTRAMUSCULAR | Status: AC
  Filled 2013-08-04: qty 1

## 2013-08-04 MED ORDER — PROVISC 10 MG/ML IO SOLN
INTRAOCULAR | Status: DC | PRN
  Administered 2013-08-04: 0.85 mL via INTRAOCULAR

## 2013-08-04 MED ORDER — CYCLOPENTOLATE-PHENYLEPHRINE OP SOLN OPTIME - NO CHARGE
OPHTHALMIC | Status: AC
  Filled 2013-08-04: qty 2

## 2013-08-04 MED ORDER — BSS IO SOLN
INTRAOCULAR | Status: DC | PRN
  Administered 2013-08-04: 15 mL via INTRAOCULAR

## 2013-08-04 MED ORDER — TETRACAINE HCL 0.5 % OP SOLN
OPHTHALMIC | Status: AC
  Filled 2013-08-04: qty 2

## 2013-08-04 MED ORDER — NEOMYCIN-POLYMYXIN-DEXAMETH 3.5-10000-0.1 OP SUSP
OPHTHALMIC | Status: DC | PRN
  Administered 2013-08-04: 2 [drp] via OPHTHALMIC

## 2013-08-04 MED ORDER — CYCLOPENTOLATE-PHENYLEPHRINE 0.2-1 % OP SOLN
1.0000 [drp] | OPHTHALMIC | Status: AC
  Administered 2013-08-04 (×3): 1 [drp] via OPHTHALMIC

## 2013-08-04 MED ORDER — FENTANYL CITRATE 0.05 MG/ML IJ SOLN
25.0000 ug | INTRAMUSCULAR | Status: AC
  Administered 2013-08-04 (×2): 25 ug via INTRAVENOUS

## 2013-08-04 MED ORDER — LIDOCAINE HCL (PF) 1 % IJ SOLN
INTRAMUSCULAR | Status: AC
  Filled 2013-08-04: qty 2

## 2013-08-04 MED ORDER — KETOROLAC TROMETHAMINE 0.5 % OP SOLN: 1.0000 [drp] | OPHTHALMIC | Status: DC

## 2013-08-04 SURGICAL SUPPLY — 32 items
CAPSULAR TENSION RING-AMO (OPHTHALMIC RELATED) IMPLANT
CLOTH BEACON ORANGE TIMEOUT ST (SAFETY) ×2 IMPLANT
EYE SHIELD UNIVERSAL CLEAR (GAUZE/BANDAGES/DRESSINGS) ×2 IMPLANT
GLOVE BIO SURGEON STRL SZ 6.5 (GLOVE) IMPLANT
GLOVE BIOGEL PI IND STRL 6.5 (GLOVE) IMPLANT
GLOVE BIOGEL PI IND STRL 7.0 (GLOVE) ×2 IMPLANT
GLOVE BIOGEL PI IND STRL 7.5 (GLOVE) IMPLANT
GLOVE BIOGEL PI INDICATOR 6.5 (GLOVE)
GLOVE BIOGEL PI INDICATOR 7.0 (GLOVE) ×2
GLOVE BIOGEL PI INDICATOR 7.5 (GLOVE)
GLOVE ECLIPSE 6.5 STRL STRAW (GLOVE) IMPLANT
GLOVE ECLIPSE 7.0 STRL STRAW (GLOVE) IMPLANT
GLOVE ECLIPSE 7.5 STRL STRAW (GLOVE) IMPLANT
GLOVE EXAM NITRILE LRG STRL (GLOVE) IMPLANT
GLOVE EXAM NITRILE MD LF STRL (GLOVE) IMPLANT
GLOVE SKINSENSE NS SZ6.5 (GLOVE)
GLOVE SKINSENSE NS SZ7.0 (GLOVE)
GLOVE SKINSENSE STRL SZ6.5 (GLOVE) IMPLANT
GLOVE SKINSENSE STRL SZ7.0 (GLOVE) IMPLANT
KIT VITRECTOMY (OPHTHALMIC RELATED) IMPLANT
PAD ARMBOARD 7.5X6 YLW CONV (MISCELLANEOUS) ×2 IMPLANT
PROC W NO LENS (INTRAOCULAR LENS)
PROC W SPEC LENS (INTRAOCULAR LENS)
PROCESS W NO LENS (INTRAOCULAR LENS) IMPLANT
PROCESS W SPEC LENS (INTRAOCULAR LENS) IMPLANT
RING MALYGIN (MISCELLANEOUS) IMPLANT
SIGHTPATH CAT PROC W REG LENS (Ophthalmic Related) ×2 IMPLANT
SYR TB 1ML LL NO SAFETY (SYRINGE) ×2 IMPLANT
TAPE SURG TRANSPORE 1 IN (GAUZE/BANDAGES/DRESSINGS) ×1 IMPLANT
TAPE SURGICAL TRANSPORE 1 IN (GAUZE/BANDAGES/DRESSINGS) ×1
VISCOELASTIC ADDITIONAL (OPHTHALMIC RELATED) IMPLANT
WATER STERILE IRR 250ML POUR (IV SOLUTION) ×2 IMPLANT

## 2013-08-04 NOTE — H&P (Signed)
I have reviewed the H&P, the patient was re-examined, and I have identified no interval changes in medical condition and plan of care since the history and physical of record  

## 2013-08-04 NOTE — Op Note (Signed)
Date of Admission: 08/04/2013  Date of Surgery: 08/04/2013   Pre-Op Dx: Cataract Left Eye  Post-Op Dx: Cataract Left  Eye,  Dx Code 366.19  Surgeon: Tonny Branch, M.D.  Assistants: None  Anesthesia: Topical with MAC  Indications: Painless, progressive loss of vision with compromise of daily activities.  Surgery: Cataract Extraction with Intraocular lens Implant Left Eye  Discription: The patient had dilating drops and viscous lidocaine placed into the Left eye in the pre-op holding area. After transfer to the operating room, a time out was performed. The patient was then prepped and draped. Beginning with a 30 degree blade a paracentesis port was made at the surgeon's 2 o'clock position. The anterior chamber was then filled with 1% non-preserved lidocaine. This was followed by filling the anterior chamber with Provisc.  A 2.75mm keratome blade was used to make a clear corneal incision at the temporal limbus.  A bent cystatome needle was used to create a continuous tear capsulotomy. Hydrodissection was performed with balanced salt solution on a Fine canula. The lens nucleus was then removed using the phacoemulsification handpiece. Residual cortex was removed with the I&A handpiece. The anterior chamber and capsular bag were refilled with Provisc. A posterior chamber intraocular lens was placed into the capsular bag with it's injector. The implant was positioned with the Kuglan hook. The Provisc was then removed from the anterior chamber and capsular bag with the I&A handpiece. Stromal hydration of the main incision and paracentesis port was performed with BSS on a Fine canula. The wounds were tested for leak which was negative. The patient tolerated the procedure well. There were no operative complications. The patient was then transferred to the recovery room in stable condition.  Complications: None  Specimen: None  EBL: None  Prosthetic device: Hoya iSert 250, power 19.5 D, SN [id number].

## 2013-08-04 NOTE — Anesthesia Preprocedure Evaluation (Signed)
Anesthesia Evaluation  Patient identified by MRN, date of birth, ID band Patient awake    Reviewed: Allergy & Precautions, H&P , NPO status , Patient's Chart, lab work & pertinent test results  History of Anesthesia Complications Negative for: history of anesthetic complications  Airway Mallampati: II TM Distance: >3 FB     Dental  (+) Teeth Intact, Implants   Pulmonary  breath sounds clear to auscultation        Cardiovascular negative cardio ROS  Rhythm:Regular Rate:Normal     Neuro/Psych  Headaches, TIA   GI/Hepatic GERD-  Medicated and Controlled,  Endo/Other  Hypothyroidism   Renal/GU      Musculoskeletal   Abdominal   Peds  Hematology   Anesthesia Other Findings   Reproductive/Obstetrics                          Anesthesia Physical Anesthesia Plan  ASA: II  Anesthesia Plan: MAC   Post-op Pain Management:    Induction: Intravenous  Airway Management Planned: Nasal Cannula  Additional Equipment:   Intra-op Plan:   Post-operative Plan:   Informed Consent: I have reviewed the patients History and Physical, chart, labs and discussed the procedure including the risks, benefits and alternatives for the proposed anesthesia with the patient or authorized representative who has indicated his/her understanding and acceptance.     Plan Discussed with:   Anesthesia Plan Comments:         Anesthesia Quick Evaluation  

## 2013-08-04 NOTE — Anesthesia Postprocedure Evaluation (Signed)
  Anesthesia Post-op Note  Patient: Grace [name]  Procedure(s) Performed: Procedure(s) with comments: CATARACT EXTRACTION PHACO AND INTRAOCULAR LENS PLACEMENT (IOC) (Left) - CDE:16.26  Patient Location: Short Stay  Anesthesia Type:MAC  Level of Consciousness: awake, alert  and oriented  Airway and Oxygen Therapy: Patient Spontanous Breathing  Post-op Pain: none  Post-op Assessment: Post-op Vital signs reviewed, Patient's Cardiovascular Status Stable, Respiratory Function Stable, Patent Airway and No signs of Nausea or vomiting  Post-op Vital Signs: Reviewed and stable  Last Vitals:  Filed Vitals:   08/04/13 0830  BP: 116/61  Pulse:   Temp:   Resp: 28    Complications: No apparent anesthesia complications

## 2013-08-04 NOTE — Discharge Instructions (Signed)

## 2013-08-04 NOTE — Transfer of Care (Signed)
Immediate Anesthesia Transfer of Care Note  Patient: Grace [name]  Procedure(s) Performed: Procedure(s) with comments: CATARACT EXTRACTION PHACO AND INTRAOCULAR LENS PLACEMENT (IOC) (Left) - CDE:16.26  Patient Location: Short Stay  Anesthesia Type:MAC  Level of Consciousness: awake  Airway & Oxygen Therapy: Patient Spontanous Breathing  Post-op Assessment: Report given to PACU RN  Post vital signs: Reviewed  Complications: No apparent anesthesia complications

## 2013-08-05 ENCOUNTER — Encounter (HOSPITAL_COMMUNITY): Payer: Self-pay | Admitting: Ophthalmology

## 2013-08-08 ENCOUNTER — Ambulatory Visit (HOSPITAL_COMMUNITY)
Admission: RE | Admit: 2013-08-08 | Discharge: 2013-08-08 | Disposition: A | Discharge status: Home / Self Care | Payer: Medicare Other | Source: Ambulatory Visit | Attending: Internal Medicine | Admitting: Internal Medicine

## 2013-08-08 DIAGNOSIS — I700 Atherosclerosis of aorta: Secondary | ICD-10-CM | POA: Insufficient documentation

## 2013-08-08 DIAGNOSIS — R112 Nausea with vomiting, unspecified: Secondary | ICD-10-CM | POA: Insufficient documentation

## 2013-08-08 DIAGNOSIS — R634 Abnormal weight loss: Secondary | ICD-10-CM | POA: Insufficient documentation

## 2013-08-08 DIAGNOSIS — M47814 Spondylosis without myelopathy or radiculopathy, thoracic region: Secondary | ICD-10-CM | POA: Insufficient documentation

## 2013-08-08 DIAGNOSIS — R6881 Early satiety: Secondary | ICD-10-CM | POA: Insufficient documentation

## 2013-08-08 MED ORDER — IOHEXOL 300 MG/ML  SOLN
100.0000 mL | Freq: Once | INTRAMUSCULAR | Status: AC | PRN
  Administered 2013-08-08: 100 mL via INTRAVENOUS

## 2013-08-11 ENCOUNTER — Other Ambulatory Visit (HOSPITAL_COMMUNITY): Payer: Medicare Other

## 2013-08-15 ENCOUNTER — Other Ambulatory Visit: Payer: Self-pay | Admitting: Family Medicine

## 2013-08-29 ENCOUNTER — Telehealth: Payer: Self-pay | Admitting: Family Medicine

## 2013-08-29 NOTE — Telephone Encounter (Signed)
Patient said that Dr. Laural Golden was supposed to go over the [place] notes with Dr. Nicki Reaper from her visit with him. She said that Dr. Laural Golden wanted her to stop her actonel due to osteoporosis. She wants to know if you can take another look at these notes and give her your opinion or does she need to schedule an appointment?

## 2013-08-29 NOTE — Telephone Encounter (Signed)
Her test from last September showed osteoporosis. I would recommend IV medication. I was under the impression after talking with Dr. Laural Golden that she would be following up with [place] to discuss this in person. If it is he is here for her I can go ahead and set up IV therapy but if she would rather come in and discuss the IV treatments with the knee but I would be more than happy to discuss this with her face-to-face. Please let me know.

## 2013-08-30 NOTE — Telephone Encounter (Signed)
Notified patient her test from last September showed osteoporosis. Recommend IV medication. Dr. Nicki Reaper was under the impression after talking with Dr. Laural Golden that she would be following up with [place] to discuss this in person. Patient was transferred to front desk to schedule appointment.

## 2013-09-01 ENCOUNTER — Ambulatory Visit (INDEPENDENT_AMBULATORY_CARE_PROVIDER_SITE_OTHER): Payer: 59 | Admitting: Family Medicine

## 2013-09-01 ENCOUNTER — Encounter: Payer: Self-pay | Admitting: Family Medicine

## 2013-09-01 VITALS — BP 118/72 | Ht 62.0 in | Wt 136.0 lb

## 2013-09-01 DIAGNOSIS — M810 Age-related osteoporosis without current pathological fracture: Secondary | ICD-10-CM

## 2013-09-01 NOTE — Progress Notes (Signed)
   Subjective:    Patient ID: Grace [name], female    DOB: 03-27-1946, 68 y.o.   MRN: [id number]  HPI Patient is here today to discuss osteoporosis. She said you talked to Dr. Laural Golden and that the patient needed to come in to discuss further measures with you.   Patient with some symptoms of esophagitis she is using the medication every other day to help keep it under control. Dr. Laural Golden do not want her to be on oral biphosphonate any longer Review of Systems     Objective:   Physical Exam  The majority time spent with patient discussing her issues and showing her her bone density from last year.      Assessment & Plan:  Greater than 15 minutes spent discussing osteoporosis and various treatments. We will look in to see whether or not IV medication covered. We will look at [facility] as well as Reclast

## 2013-09-19 ENCOUNTER — Encounter (INDEPENDENT_AMBULATORY_CARE_PROVIDER_SITE_OTHER): Payer: Self-pay | Admitting: Internal Medicine

## 2013-09-19 ENCOUNTER — Ambulatory Visit (INDEPENDENT_AMBULATORY_CARE_PROVIDER_SITE_OTHER): Payer: Medicare Other | Admitting: Internal Medicine

## 2013-09-19 VITALS — BP 108/70 | HR 72 | Temp 98.6°F | Resp 18 | Ht 63.0 in | Wt 133.2 lb

## 2013-09-19 DIAGNOSIS — R6881 Early satiety: Secondary | ICD-10-CM

## 2013-09-19 DIAGNOSIS — M810 Age-related osteoporosis without current pathological fracture: Secondary | ICD-10-CM

## 2013-09-19 DIAGNOSIS — R5381 Other malaise: Secondary | ICD-10-CM

## 2013-09-19 DIAGNOSIS — R634 Abnormal weight loss: Secondary | ICD-10-CM

## 2013-09-19 DIAGNOSIS — R5383 Other fatigue: Secondary | ICD-10-CM | POA: Insufficient documentation

## 2013-09-19 MED ORDER — RISEDRONATE SODIUM 35 MG PO TABS: 35.0000 mg | ORAL_TABLET | ORAL | Status: DC

## 2013-09-19 NOTE — Patient Instructions (Addendum)
Exercises daily as discussed. Resume Actonel at 35 mg by mouth every week. Notify if you have epigastric pain in which case will do blood work(LFTs). Call office with progress report in 2 months.

## 2013-09-19 NOTE — Progress Notes (Signed)
Presenting complaint;  Followup for weight loss and early satiety.  Database;  Patient is 68 year old Caucasian female who was initially seen on 07/27/2013 for 10 pound weight loss and early satiety. CBC and comprehensive chemistry panel are within normal limits. She is on levothyroxine and her TSH levels were normal in February 2015. She underwent EGD on 07/28/2013 revealing few small hyperplastic-appearing polyps the gastric body. 4 of these polyps were biopsied and turned out to be hyperplastic polyps. H. pylori stains are negative. Abdominal pelvic CT was obtained 08/08/2013 and no abnormality noted to account for patient's symptoms. Actonel was discontinued.   Subjective:  Patient returns for followup visit. She has been off Actonel for seven weeks but cannot tell any improvement. She does not have a good appetite. She still gets filled up quickly. She had hamburger last evening and felt bloated and miserable for a while. She did not have nausea or vomiting. She denies abdominal pain melena or rectal bleeding. She continues to complain of feeling tired and having no energy. He also complains of hot flashes usually at night couple of times a week. She states she gets out when she goes for grocery shopping and also becomes short of breath. She has history of mitral valve prolapse but the function was normal an echo of 02/18/2012. She does not feel depressed. She denies fever chills or night sweats.     Current Medications: Outpatient Encounter Prescriptions as of 09/19/2013  Medication Sig  . calcium carbonate (OS-CAL) 600 MG TABS tablet Take 600 mg by mouth daily with breakfast.  . levothyroxine (SYNTHROID, LEVOTHROID) 75 MCG tablet TAKE 1/2 TABLET BY MOUTH ON MON AND FRI. TAKE 1 TABLET ON TUES, WED, THUR, SAT, AND SUN.  . Multiple Vitamins-Minerals (ALIVE WOMENS 50+) TABS Take 1 tablet by mouth every evening.   . vitamin E 400 UNIT capsule Take 400 Units by mouth daily.  .  [DISCONTINUED] cycloSPORINE (RESTASIS) 0.05 % ophthalmic emulsion Place 1 drop into both eyes 2 (two) times daily.     Objective: Blood pressure 108/70, pulse 72, temperature 98.6 F (37 C), temperature source Oral, resp. rate 18, height 5\' 3"  (1.6 m), weight 133 lb 3.2 oz (60.419 kg). Patient is alert and in no acute distress. Conjunctiva is pink. Sclera is nonicteric Oropharyngeal mucosa is normal. No neck masses or thyromegaly noted. Cardiac exam with regular rhythm normal S1 and S2. No murmur or gallop noted. Lungs are clear to auscultation. Abdomen abdomen is symmetrical soft and with minimal tenderness in midepigastrium. No organomegaly or masses. No LE edema or clubbing noted.   Assessment:  #1. Early satiety. She had essentially negative EGD and abdominal pelvic CT. It appears she is on adequate dose of levothyroxine. If this symptom persists will consider estrogen emptying study. In the meantime she needs to start exercising and walking regularly which may help her appetite. #2. Weight loss. This seems to have leveled of as her weight is only down by 1 pound in the last 2 months. #3. Fatigue. No obvious explanation for the symptoms. H&H was normal. She does not have evidence of muscle wasting.  #4. Osteoporosis. She will go back on Actonel as her symptoms are not results of this medication.   Plan:  Resume Actonel at 35 mg by mouth q. Weekly. Begin walking on a treadmill starting at a low pace for 5-10 minutes and gradually increasing duration. Patient will call with progress report in 8 weeks and if early satiety is unchanged will consider a gastric  emptying study. Regarding her fatigue and hot flashes she will followup with Dr. Tennis Must. Office visit in 6 months.

## 2013-09-23 ENCOUNTER — Telehealth: Payer: Self-pay | Admitting: Family Medicine

## 2013-09-23 NOTE — Telephone Encounter (Signed)
Patient notified and verbalized understanding. 

## 2013-09-23 NOTE — Telephone Encounter (Signed)
Patient seen Dr.Rehman and 6/22 and she wants to know what was dicussed about her condition. She was told that he was going to contact you about her visit.

## 2013-09-23 NOTE — Telephone Encounter (Signed)
He stated that she could restart her osteoporosis med. He did not feel she needed IV med. Also he stated pt should follow up here within next 30 days if dealing with fatigue or being stressed out to evaluate for possible depression (pt should follow up within 30 days if she doesn't feel things are going well)

## 2013-11-01 ENCOUNTER — Encounter: Payer: Self-pay | Admitting: Family Medicine

## 2013-11-01 ENCOUNTER — Other Ambulatory Visit: Payer: Self-pay | Admitting: Family Medicine

## 2013-11-01 ENCOUNTER — Ambulatory Visit (INDEPENDENT_AMBULATORY_CARE_PROVIDER_SITE_OTHER): Payer: 59 | Admitting: Family Medicine

## 2013-11-01 VITALS — BP 122/72 | Temp 98.6°F | Ht 62.0 in | Wt 135.0 lb

## 2013-11-01 DIAGNOSIS — D510 Vitamin B12 deficiency anemia due to intrinsic factor deficiency: Secondary | ICD-10-CM

## 2013-11-01 DIAGNOSIS — D509 Iron deficiency anemia, unspecified: Secondary | ICD-10-CM

## 2013-11-01 DIAGNOSIS — R5383 Other fatigue: Secondary | ICD-10-CM

## 2013-11-01 DIAGNOSIS — R5381 Other malaise: Secondary | ICD-10-CM

## 2013-11-01 LAB — CBC WITH DIFFERENTIAL/PLATELET
Basophils Absolute: 0.1 10*3/uL (ref 0.0–0.1)
Basophils Relative: 1 % (ref 0–1)
Eosinophils Absolute: 0.2 10*3/uL (ref 0.0–0.7)
Eosinophils Relative: 2 % (ref 0–5)
HCT: 40 % (ref 36.0–46.0)
Hemoglobin: 13.7 g/dL (ref 12.0–15.0)
Lymphocytes Relative: 28 % (ref 12–46)
Lymphs Abs: 2.1 10*3/uL (ref 0.7–4.0)
MCH: 30.6 pg (ref 26.0–34.0)
MCHC: 34.3 g/dL (ref 30.0–36.0)
MCV: 89.3 fL (ref 78.0–100.0)
Monocytes Absolute: 0.6 10*3/uL (ref 0.1–1.0)
Monocytes Relative: 8 % (ref 3–12)
Neutro Abs: 4.6 10*3/uL (ref 1.7–7.7)
Neutrophils Relative %: 61 % (ref 43–77)
Platelets: 221 10*3/uL (ref 150–400)
RBC: 4.48 MIL/uL (ref 3.87–5.11)
RDW: 13.2 % (ref 11.5–15.5)
WBC: 7.6 10*3/uL (ref 4.0–10.5)

## 2013-11-01 NOTE — Progress Notes (Signed)
   Subjective:    Patient ID: Grace [name], female    DOB: 12/14/1945, 68 y.o.   MRN: [id number]  HPI Patient is here today because she has been experiencing, fatigue, nervousness, sweats and chills. This has been present for about several months now. Patient complains of bilateral ankle edema that started about 1 week ago. Patient has not taken any type of treatment for the symptoms.  Patient states that she also has some rib pain on the left side that has been present since last week.  Patient wonders if her hormone levels are low. I told her that it is more than likely that they are because of her age. I do not recommend testing nose. She does have a history of osteoporosis and a remote history of pernicious anemia  Review of Systems See above she denies coughing wheezing shortness of breath vomiting diarrhea, occasionally has sweats, no chills she does relates, no fevers    Objective:   Physical Exam Her lungs are clear hearts regular pulse normal abdomen is soft extremities no edema skin warm dry neurologic grossly normal       Assessment & Plan:  Fatigue tiredness she does have a history of anemia we will be checking some lab work await the results she denies being depressed she filled out a pH daily 9 in this survey was negative for depression  She does have osteoporosis we will try to get recent note from her endocrinologist. Patient states he endocrinologist did not recommend oral Actonel because of the gastritis she had so therefore we may well be pursuing intravenous treatment for osteoporosis.  Greater than 25 minutes spent with patient labs pending

## 2013-11-02 LAB — FERRITIN: Ferritin: 60 ng/mL (ref 10–291)

## 2013-11-02 LAB — SEDIMENTATION RATE: Sed Rate: 1 mm/hr (ref 0–22)

## 2013-11-02 LAB — IRON AND TIBC
%SAT: 19 % — ABNORMAL LOW (ref 20–55)
Iron: 53 ug/dL (ref 42–145)
TIBC: 272 ug/dL (ref 250–470)
UIBC: 219 ug/dL (ref 125–400)

## 2013-11-02 LAB — VITAMIN B12: Vitamin B-12: 242 pg/mL (ref 211–911)

## 2013-11-04 ENCOUNTER — Encounter: Payer: Self-pay | Admitting: Family Medicine

## 2013-11-04 DIAGNOSIS — D510 Vitamin B12 deficiency anemia due to intrinsic factor deficiency: Secondary | ICD-10-CM | POA: Insufficient documentation

## 2013-11-04 LAB — METHYLMALONIC ACID, SERUM: Methylmalonic Acid, Quant: 350 nmol/L — ABNORMAL HIGH (ref 87–318)

## 2013-11-08 ENCOUNTER — Telehealth: Payer: Self-pay | Admitting: *Deleted

## 2013-11-08 ENCOUNTER — Ambulatory Visit (INDEPENDENT_AMBULATORY_CARE_PROVIDER_SITE_OTHER): Payer: 59 | Admitting: *Deleted

## 2013-11-08 DIAGNOSIS — Z0189 Encounter for other specified special examinations: Secondary | ICD-10-CM

## 2013-11-08 DIAGNOSIS — E538 Deficiency of other specified B group vitamins: Secondary | ICD-10-CM

## 2013-11-08 DIAGNOSIS — M810 Age-related osteoporosis without current pathological fracture: Secondary | ICD-10-CM

## 2013-11-08 LAB — POC HEMOCCULT BLD/STL (HOME/3-CARD/SCREEN)
Card #2 Fecal Occult Blod, POC: NEGATIVE
Card #3 Fecal Occult Blood, POC: NEGATIVE
Fecal Occult Blood, POC: NEGATIVE

## 2013-11-08 MED ORDER — CYANOCOBALAMIN 1000 MCG/ML IJ SOLN
1000.0000 ug | Freq: Once | INTRAMUSCULAR | Status: AC
  Administered 2013-11-08: 1000 ug via INTRAMUSCULAR

## 2013-11-08 NOTE — Telephone Encounter (Signed)
Pt wanted to know if you have received Dr. Almetta Lovely notes about the once a year injection for her osteoporosis. She would like to go ahead with injection if her insurance will cover. She tried actonel in the past but caused stomach problems.

## 2013-11-08 NOTE — Telephone Encounter (Signed)
I have not received these as of yet. I am familiar with what to order. I could set this up at the specialty Center if the patient would like to go ahead.

## 2013-11-08 NOTE — Telephone Encounter (Signed)
Yes pt states she does want to go ahead with injection.

## 2013-11-09 NOTE — Telephone Encounter (Signed)
Referral put in. Pt notified.  

## 2013-11-09 NOTE — Telephone Encounter (Signed)
This patient does have osteoporosis. Because of severe stomach gastritis symptoms she has not been able to tolerate oral biphosphonate's such as Fosamax. This patient has clear indication for Reclast 5 mg given IV once per year through the specialty center at [facility], please set this up. I am not sure if insurance will have to be contacted in order to cover this. Please initiate the order. It would probably be wise for her to discuss with the hospital as well as her insurance company regarding coverage.

## 2013-11-10 NOTE — Progress Notes (Signed)
Patient notified and verbalized understanding. 

## 2013-11-15 ENCOUNTER — Ambulatory Visit (INDEPENDENT_AMBULATORY_CARE_PROVIDER_SITE_OTHER): Payer: 59 | Admitting: *Deleted

## 2013-11-15 DIAGNOSIS — D649 Anemia, unspecified: Secondary | ICD-10-CM

## 2013-11-15 MED ORDER — CYANOCOBALAMIN 1000 MCG/ML IJ SOLN
1000.0000 ug | Freq: Once | INTRAMUSCULAR | Status: AC
  Administered 2013-11-15: 1000 ug via INTRAMUSCULAR

## 2013-11-18 ENCOUNTER — Encounter: Payer: Self-pay | Admitting: Family Medicine

## 2013-11-22 ENCOUNTER — Ambulatory Visit (INDEPENDENT_AMBULATORY_CARE_PROVIDER_SITE_OTHER): Payer: 59

## 2013-11-22 DIAGNOSIS — E538 Deficiency of other specified B group vitamins: Secondary | ICD-10-CM

## 2013-11-22 MED ORDER — CYANOCOBALAMIN 1000 MCG/ML IJ SOLN
1000.0000 ug | Freq: Once | INTRAMUSCULAR | Status: AC
  Administered 2013-11-22: 1000 ug via INTRAMUSCULAR

## 2013-11-28 ENCOUNTER — Encounter (HOSPITAL_COMMUNITY)
Admission: RE | Admit: 2013-11-28 | Discharge: 2013-11-28 | Disposition: A | Discharge status: Home / Self Care | Payer: Medicare Other | Source: Ambulatory Visit | Attending: Family Medicine | Admitting: Family Medicine

## 2013-11-28 DIAGNOSIS — M810 Age-related osteoporosis without current pathological fracture: Secondary | ICD-10-CM | POA: Insufficient documentation

## 2013-11-28 LAB — COMPREHENSIVE METABOLIC PANEL
ALT: 15 U/L (ref 0–35)
AST: 22 U/L (ref 0–37)
Albumin: 3.8 g/dL (ref 3.5–5.2)
Alkaline Phosphatase: 75 U/L (ref 39–117)
Anion gap: 9 (ref 5–15)
BUN: 7 mg/dL (ref 6–23)
CO2: 29 mEq/L (ref 19–32)
Calcium: 9.3 mg/dL (ref 8.4–10.5)
Chloride: 101 mEq/L (ref 96–112)
Creatinine, Ser: 0.75 mg/dL (ref 0.50–1.10)
GFR calc Af Amer: >90 mL/min (ref >90)
GFR calc non Af Amer: 86 mL/min — ABNORMAL LOW (ref >90)
Glucose, Bld: 87 mg/dL (ref 70–99)
Potassium: 4.1 mEq/L (ref 3.7–5.3)
Sodium: 139 mEq/L (ref 137–147)
Total Bilirubin: 0.7 mg/dL (ref 0.3–1.2)
Total Protein: 7.2 g/dL (ref 6.0–8.3)

## 2013-11-28 MED ORDER — ZOLEDRONIC ACID 5 MG/100ML IV SOLN
5.0000 mg | Freq: Once | INTRAVENOUS | Status: AC
  Administered 2013-11-28: 5 mg via INTRAVENOUS

## 2013-11-28 MED ORDER — ZOLEDRONIC ACID 5 MG/100ML IV SOLN
INTRAVENOUS | Status: AC
  Filled 2013-11-28: qty 100

## 2013-11-28 MED ORDER — SODIUM CHLORIDE 0.9 % IV SOLN
INTRAVENOUS | Status: DC
  Administered 2013-11-28: 200 mL via INTRAVENOUS

## 2013-11-28 NOTE — Progress Notes (Signed)
Literature given to pt regarding med since this was 1st dose. Labs faxed to Dr Sallee Lange. Pt tolerated med well.

## 2013-11-29 ENCOUNTER — Ambulatory Visit (INDEPENDENT_AMBULATORY_CARE_PROVIDER_SITE_OTHER): Payer: 59 | Admitting: *Deleted

## 2013-11-29 DIAGNOSIS — D510 Vitamin B12 deficiency anemia due to intrinsic factor deficiency: Secondary | ICD-10-CM

## 2013-11-29 MED ORDER — CYANOCOBALAMIN 1000 MCG/ML IJ SOLN
1000.0000 ug | Freq: Once | INTRAMUSCULAR | Status: AC
  Administered 2013-11-29: 1000 ug via INTRAMUSCULAR

## 2013-12-12 ENCOUNTER — Encounter: Payer: Self-pay | Admitting: Family Medicine

## 2013-12-12 ENCOUNTER — Ambulatory Visit (INDEPENDENT_AMBULATORY_CARE_PROVIDER_SITE_OTHER): Payer: 59 | Admitting: Family Medicine

## 2013-12-12 VITALS — BP 124/78 | Temp 98.7°F | Ht 62.0 in | Wt 134.0 lb

## 2013-12-12 DIAGNOSIS — J3089 Other allergic rhinitis: Secondary | ICD-10-CM

## 2013-12-12 MED ORDER — FLUTICASONE PROPIONATE 50 MCG/ACT NA SUSP: 2.0000 | Freq: Every day | NASAL | Status: DC

## 2013-12-12 MED ORDER — HYDROCODONE-HOMATROPINE 5-1.5 MG/5ML PO SYRP: 5.0000 mL | ORAL_SOLUTION | Freq: Three times a day (TID) | ORAL | Status: DC | PRN

## 2013-12-12 NOTE — Progress Notes (Signed)
   Subjective:    Patient ID: Grace [name], female    DOB: 27-Jan-1946, 68 y.o.   MRN: [id number]  HPI Patient is here today with a cough, sneezing , sniffles, runny nose, sore throat & hoarse. Patient doesn't have a fever. Temp taking 98.7 All started within the past 24 hours patient concerned about the possibility of allergies versus infection  Review of Systems  Constitutional: Negative for fever and activity change.  HENT: Positive for congestion and rhinorrhea. Negative for ear pain.   Eyes: Negative for discharge.  Respiratory: Positive for cough. Negative for shortness of breath and wheezing.   Cardiovascular: Negative for chest pain.       Objective:   Physical Exam  Nursing note and vitals reviewed. Constitutional: She appears well-developed.  HENT:  Head: Normocephalic.  Nose: Nose normal.  Mouth/Throat: Oropharynx is clear and moist. No oropharyngeal exudate.  Neck: Neck supple.  Cardiovascular: Normal rate and normal heart sounds.   No murmur heard. Pulmonary/Chest: Effort normal and breath sounds normal. She has no wheezes.  Lymphadenopathy:    She has no cervical adenopathy.  Skin: Skin is warm and dry.          Assessment & Plan:  Viral syndrome should gradually get better  Possible allergies uses Allegra 1 days warning signs discussed it progresses symptoms almost back

## 2013-12-14 ENCOUNTER — Telehealth: Payer: Self-pay | Admitting: Family Medicine

## 2013-12-14 MED ORDER — AMOXICILLIN-POT CLAVULANATE 875-125 MG PO TABS: 1.0000 | ORAL_TABLET | Freq: Two times a day (BID) | ORAL | Status: AC

## 2013-12-14 NOTE — Telephone Encounter (Signed)
Rx sent electronically to pharmacy. Patient notified. 

## 2013-12-14 NOTE — Telephone Encounter (Signed)
Augmentin 875 mg 1 twice a day with food and water for 10 days followup if ongoing

## 2013-12-14 NOTE — Telephone Encounter (Signed)
Pt was seen Monday 12/12/13, states Dr. Nicki Reaper said if she wasn't any better in 3 to 4 days to call & he would call her in an antibiotic, pt states she's no better  [facility]

## 2013-12-29 ENCOUNTER — Other Ambulatory Visit: Payer: Self-pay | Admitting: *Deleted

## 2013-12-29 ENCOUNTER — Encounter (INDEPENDENT_AMBULATORY_CARE_PROVIDER_SITE_OTHER): Payer: Self-pay | Admitting: *Deleted

## 2013-12-29 ENCOUNTER — Telehealth: Payer: Self-pay | Admitting: *Deleted

## 2013-12-29 ENCOUNTER — Ambulatory Visit (INDEPENDENT_AMBULATORY_CARE_PROVIDER_SITE_OTHER): Payer: 59 | Admitting: *Deleted

## 2013-12-29 DIAGNOSIS — Z79899 Other long term (current) drug therapy: Secondary | ICD-10-CM

## 2013-12-29 DIAGNOSIS — D649 Anemia, unspecified: Secondary | ICD-10-CM

## 2013-12-29 DIAGNOSIS — R5383 Other fatigue: Secondary | ICD-10-CM

## 2013-12-29 DIAGNOSIS — Z23 Encounter for immunization: Secondary | ICD-10-CM

## 2013-12-29 MED ORDER — CYANOCOBALAMIN 1000 MCG/ML IJ SOLN
1000.0000 ug | Freq: Once | INTRAMUSCULAR | Status: AC
  Administered 2013-12-29: 1000 ug via INTRAMUSCULAR

## 2013-12-29 NOTE — Telephone Encounter (Signed)
CBC, ferritin, Hemoccult cards x3(again bcz black stool), metabolic 7

## 2013-12-29 NOTE — Telephone Encounter (Signed)
bloodwork orders put in. Pt notified. Pt states her stools are no longer black since stopping the iron and wants to know if she still has to do hemoccult cards. Consult with Dr. Nicki Reaper and pt does not have to do hemoccult cards. Pt notified.

## 2013-12-29 NOTE — Telephone Encounter (Signed)
Pt in today with Vit B12

## 2013-12-29 NOTE — Telephone Encounter (Signed)
In today for B12 injection. Pt states she stopped taking her iron 65mg  tablet because of black stools for the past 2- 3 weeks and diarrhea off and on for 2 weeks. Last bloodwork 11/01/13 - cbc, TIBC, B12,  and ferritin. Hemoccult on 11/08/13 were negative. Pt want to know when she needs to repeat bloodwork. Also wants calcium level done.

## 2013-12-29 NOTE — Addendum Note (Signed)
Addended by: Carmelina Noun on: 12/29/2013 04:40 PM   Modules accepted: Orders

## 2013-12-30 LAB — CBC WITH DIFFERENTIAL/PLATELET
Basophils Absolute: 0.1 10*3/uL (ref 0.0–0.1)
Basophils Relative: 1 % (ref 0–1)
Eosinophils Absolute: 0.1 10*3/uL (ref 0.0–0.7)
Eosinophils Relative: 2 % (ref 0–5)
HCT: 41.2 % (ref 36.0–46.0)
Hemoglobin: 14.4 g/dL (ref 12.0–15.0)
Lymphocytes Relative: 24 % (ref 12–46)
Lymphs Abs: 1.6 10*3/uL (ref 0.7–4.0)
MCH: 30.8 pg (ref 26.0–34.0)
MCHC: 35 g/dL (ref 30.0–36.0)
MCV: 88 fL (ref 78.0–100.0)
Monocytes Absolute: 0.7 10*3/uL (ref 0.1–1.0)
Monocytes Relative: 10 % (ref 3–12)
Neutro Abs: 4.2 10*3/uL (ref 1.7–7.7)
Neutrophils Relative %: 63 % (ref 43–77)
Platelets: 191 10*3/uL (ref 150–400)
RBC: 4.68 MIL/uL (ref 3.87–5.11)
RDW: 13 % (ref 11.5–15.5)
WBC: 6.6 10*3/uL (ref 4.0–10.5)

## 2013-12-30 LAB — BASIC METABOLIC PANEL
BUN: 8 mg/dL (ref 6–23)
CO2: 28 mEq/L (ref 19–32)
Calcium: 8.7 mg/dL (ref 8.4–10.5)
Chloride: 104 mEq/L (ref 96–112)
Creat: 0.68 mg/dL (ref 0.50–1.10)
Glucose, Bld: 90 mg/dL (ref 70–99)
Potassium: 3.9 mEq/L (ref 3.5–5.3)
Sodium: 137 mEq/L (ref 135–145)

## 2013-12-30 LAB — FERRITIN: Ferritin: 83 ng/mL (ref 10–291)

## 2014-01-01 ENCOUNTER — Encounter: Payer: Self-pay | Admitting: Family Medicine

## 2014-01-11 ENCOUNTER — Ambulatory Visit: Payer: 59 | Admitting: Family Medicine

## 2014-01-12 ENCOUNTER — Ambulatory Visit (INDEPENDENT_AMBULATORY_CARE_PROVIDER_SITE_OTHER): Payer: 59 | Admitting: Nurse Practitioner

## 2014-01-12 ENCOUNTER — Encounter: Payer: Self-pay | Admitting: Nurse Practitioner

## 2014-01-12 VITALS — BP 122/78 | Ht 62.0 in | Wt 135.0 lb

## 2014-01-12 DIAGNOSIS — Z01419 Encounter for gynecological examination (general) (routine) without abnormal findings: Secondary | ICD-10-CM

## 2014-01-12 DIAGNOSIS — Z0000 Encounter for general adult medical examination without abnormal findings: Secondary | ICD-10-CM

## 2014-01-12 DIAGNOSIS — Z23 Encounter for immunization: Secondary | ICD-10-CM

## 2014-01-12 NOTE — Patient Instructions (Signed)
Soy Flaxseed Black cohosh [place] 

## 2014-01-13 ENCOUNTER — Other Ambulatory Visit: Payer: Self-pay

## 2014-01-13 ENCOUNTER — Encounter: Payer: Self-pay | Admitting: Nurse Practitioner

## 2014-01-13 DIAGNOSIS — Z0000 Encounter for general adult medical examination without abnormal findings: Secondary | ICD-10-CM

## 2014-01-13 LAB — LIPID PANEL
Cholesterol: 190 mg/dL (ref 0–200)
HDL: 38 mg/dL — ABNORMAL LOW (ref >39)
LDL Cholesterol: 134 mg/dL — ABNORMAL HIGH (ref 0–99)
Total CHOL/HDL Ratio: 5 Ratio
Triglycerides: 91 mg/dL (ref <150)
VLDL: 18 mg/dL (ref 0–40)

## 2014-01-13 NOTE — Progress Notes (Signed)
   Subjective:    Patient ID: Grace [name], female    DOB: 12/26/1945, 68 y.o.   MRN: [id number]  HPI presents for wellness exam. Regular exercise. Healthy diet. Regular vision and dental exams. Regular skin cancer screenings with her dermatologist. Sees endocrinology for her thyroid issues. Is not sexually active. Some generalized fatigue. No fevers. Questions whether the fatigue can be related to menopause. Denies any overt depression symptoms. No unusual stress.    Review of Systems  Constitutional: Positive for fatigue. Negative for fever, activity change and appetite change.  HENT: Negative for dental problem, ear pain, sinus pressure and sore throat.   Respiratory: Negative for cough, chest tightness, shortness of breath and wheezing.   Cardiovascular: Negative for chest pain.  Gastrointestinal: Negative for nausea, vomiting, abdominal pain, diarrhea, constipation, blood in stool and abdominal distention.  Genitourinary: Negative for dysuria, urgency, frequency, vaginal bleeding, vaginal discharge, enuresis, difficulty urinating, genital sores and pelvic pain.       Objective:   Physical Exam  Vitals reviewed. Constitutional: She is oriented to person, place, and time. She appears well-developed. No distress.  HENT:  Right Ear: External ear normal.  Left Ear: External ear normal.  Mouth/Throat: Oropharynx is clear and moist.  Neck: Normal range of motion. Neck supple. No tracheal deviation present. No thyromegaly present.  Cardiovascular: Normal rate, regular rhythm and normal heart sounds.  Exam reveals no gallop.   No murmur heard. Pulmonary/Chest: Effort normal and breath sounds normal.  Abdominal: Soft. She exhibits no distension. There is no tenderness.  Genitourinary:  External GU slightly pink, no rashes or lesions noted. Bimanual exam no tenderness or obvious masses. Rectal exam no masses, no stool for Hemoccult.  Musculoskeletal: She exhibits no edema.    Lymphadenopathy:    She has no cervical adenopathy.  Neurological: She is alert and oriented to person, place, and time.  Skin: Skin is warm and dry. No rash noted.  Psychiatric: She has a normal mood and affect. Her behavior is normal.   breast exam: Some dense tissue, minimal nodularity no dominant masses, axillae no adenopathy.        Assessment & Plan:  Well woman exam  Need for vaccination - Plan: Pneumococcal conjugate vaccine 13-valent  Encouraged healthy diet, regular exercise and daily vitamin D/calcium supplementation. Discussed options regarding fatigue. Given information on natural supplements per patient request. If no improvement in her symptoms over the next 4-6 weeks, recommend office visit to discuss further. Call back sooner if any problems. Return in about 1 year (around 01/13/2015).

## 2014-01-16 ENCOUNTER — Encounter: Payer: Self-pay | Admitting: Nurse Practitioner

## 2014-02-02 ENCOUNTER — Ambulatory Visit (INDEPENDENT_AMBULATORY_CARE_PROVIDER_SITE_OTHER): Payer: 59 | Admitting: *Deleted

## 2014-02-02 DIAGNOSIS — R5383 Other fatigue: Secondary | ICD-10-CM

## 2014-02-02 MED ORDER — CYANOCOBALAMIN 1000 MCG/ML IJ SOLN
1000.0000 ug | Freq: Once | INTRAMUSCULAR | Status: AC
  Administered 2014-02-02: 1000 ug via INTRAMUSCULAR

## 2014-02-09 ENCOUNTER — Other Ambulatory Visit: Payer: Self-pay | Admitting: Family Medicine

## 2014-03-02 ENCOUNTER — Encounter (INDEPENDENT_AMBULATORY_CARE_PROVIDER_SITE_OTHER): Payer: Self-pay | Admitting: *Deleted

## 2014-03-06 ENCOUNTER — Ambulatory Visit (INDEPENDENT_AMBULATORY_CARE_PROVIDER_SITE_OTHER): Payer: 59 | Admitting: Family Medicine

## 2014-03-06 ENCOUNTER — Encounter: Payer: Self-pay | Admitting: Family Medicine

## 2014-03-06 VITALS — BP 130/70 | Temp 98.8°F | Ht 62.0 in | Wt 134.5 lb

## 2014-03-06 DIAGNOSIS — F32A Depression, unspecified: Secondary | ICD-10-CM

## 2014-03-06 DIAGNOSIS — J069 Acute upper respiratory infection, unspecified: Secondary | ICD-10-CM

## 2014-03-06 DIAGNOSIS — F329 Major depressive disorder, single episode, unspecified: Secondary | ICD-10-CM

## 2014-03-06 DIAGNOSIS — J019 Acute sinusitis, unspecified: Secondary | ICD-10-CM

## 2014-03-06 MED ORDER — AMOXICILLIN 500 MG PO TABS: 500.0000 mg | ORAL_TABLET | Freq: Three times a day (TID) | ORAL | Status: DC

## 2014-03-06 MED ORDER — CITALOPRAM HYDROBROMIDE 10 MG PO TABS: 10.0000 mg | ORAL_TABLET | Freq: Every day | ORAL | Status: DC

## 2014-03-06 NOTE — Progress Notes (Signed)
   Subjective:    Patient ID: Grace [name], female    DOB: Sep 02, 1945, 68 y.o.   MRN: [id number]  Sinusitis This is a new problem. The current episode started in the past 7 days. The problem is unchanged. There has been no fever. The pain is moderate. Associated symptoms include congestion, coughing, headaches and a hoarse voice. Pertinent negatives include no ear pain or shortness of breath. 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 The treatment provided no relief.   Patient states that she wants to talk to the doctor about possible depression she is experiencing.  Patient relates feeling depressed over the past few weeks low energy sadness denies nausea or vomiting denies sleeping difficulty, not suicidal Started Saturday  Review of Systems  Constitutional: Negative for fever and activity change.  HENT: Positive for congestion, hoarse voice and rhinorrhea. Negative for ear pain.   Eyes: Negative for discharge.  Respiratory: Positive for cough. Negative for shortness of breath and wheezing.   Cardiovascular: Negative for chest pain.  Neurological: Positive for headaches.   Sinus pressure and discolored drainage    Objective:   Physical Exam  Constitutional: She appears well-developed.  HENT:  Head: Normocephalic.  Nose: Nose normal.  Mouth/Throat: Oropharynx is clear and moist. No oropharyngeal exudate.  Neck: Neck supple.  Cardiovascular: Normal rate and normal heart sounds.   No murmur heard. Pulmonary/Chest: Effort normal and breath sounds normal. She has no wheezes.  Lymphadenopathy:    She has no cervical adenopathy.  Skin: Skin is warm and dry.  Nursing note and vitals reviewed.         Assessment & Plan:  Sinusitis-antibiotics prescribed warning signs discussed follow-up if problems  Depression-not suicidal recommend Celexa 10 mg daily recommend patient to follow-up in 3-4 weeks to see how things go in sooner if any problems or side effects

## 2014-03-09 DIAGNOSIS — Z029 Encounter for administrative examinations, unspecified: Secondary | ICD-10-CM

## 2014-03-21 ENCOUNTER — Ambulatory Visit (INDEPENDENT_AMBULATORY_CARE_PROVIDER_SITE_OTHER): Payer: Medicare Other | Admitting: Internal Medicine

## 2014-03-27 DIAGNOSIS — Z0289 Encounter for other administrative examinations: Secondary | ICD-10-CM

## 2014-04-03 ENCOUNTER — Ambulatory Visit (INDEPENDENT_AMBULATORY_CARE_PROVIDER_SITE_OTHER): Payer: Medicare Other | Admitting: Family Medicine

## 2014-04-03 ENCOUNTER — Encounter: Payer: Self-pay | Admitting: Family Medicine

## 2014-04-03 VITALS — BP 130/70 | Ht 62.0 in | Wt 133.0 lb

## 2014-04-03 DIAGNOSIS — E038 Other specified hypothyroidism: Secondary | ICD-10-CM

## 2014-04-03 DIAGNOSIS — D510 Vitamin B12 deficiency anemia due to intrinsic factor deficiency: Secondary | ICD-10-CM

## 2014-04-03 LAB — TSH: TSH: 0.884 u[IU]/mL (ref 0.350–4.500)

## 2014-04-03 LAB — VITAMIN B12: Vitamin B-12: 324 pg/mL (ref 211–911)

## 2014-04-03 MED ORDER — CITALOPRAM HYDROBROMIDE 10 MG PO TABS: 10.0000 mg | ORAL_TABLET | Freq: Every day | ORAL | Status: DC

## 2014-04-03 NOTE — Progress Notes (Signed)
   Subjective:    Patient ID: Grace [name], female    DOB: July 18, 1945, 69 y.o.   MRN: [id number]  HPI Patient is here for a follow up visit on her depression. Patient is taking Celexa 20 mg daily. Patient states that the medication is working. Symptoms are improving.   Patient wants to know if she needs to continue the B12 injections or not. Has not taken one in awhile.  Patient wants to know if she can take [place] with her other medications.   Review of Systems Patient with some fatigue tiredness feeling rundown time.    Objective:   Physical Exam Lungs clear heart regular neck no masses   We talked about the estrogen supplements over-the-counter basically these are slowly products along with black cohosh seemingly it's reasonable to try these but if it doesn't help over 30 days and there is no reason to keep taking    Assessment & Plan:  Depression is active doing better she is to continue the medicine into the spring then we will stop it if she was going well  Pernicious anemia patient was on shots currently taking B12 tablets 2000 g daily we will check a B12 level if low she will need to resume the shots.  Hypothyroidism check lab work await results

## 2014-04-04 ENCOUNTER — Ambulatory Visit (INDEPENDENT_AMBULATORY_CARE_PROVIDER_SITE_OTHER): Payer: Medicare Other | Admitting: Internal Medicine

## 2014-05-01 ENCOUNTER — Encounter (INDEPENDENT_AMBULATORY_CARE_PROVIDER_SITE_OTHER): Payer: Self-pay | Admitting: Internal Medicine

## 2014-05-01 ENCOUNTER — Ambulatory Visit (INDEPENDENT_AMBULATORY_CARE_PROVIDER_SITE_OTHER): Payer: Medicare Other | Admitting: Internal Medicine

## 2014-05-01 VITALS — BP 112/74 | HR 72 | Temp 98.4°F | Resp 18 | Ht 63.0 in | Wt 133.1 lb

## 2014-05-01 DIAGNOSIS — R6881 Early satiety: Secondary | ICD-10-CM

## 2014-05-01 DIAGNOSIS — R1013 Epigastric pain: Secondary | ICD-10-CM

## 2014-05-01 DIAGNOSIS — Z87898 Personal history of other specified conditions: Secondary | ICD-10-CM

## 2014-05-01 DIAGNOSIS — Z789 Other specified health status: Secondary | ICD-10-CM

## 2014-05-01 MED ORDER — PANTOPRAZOLE SODIUM 40 MG PO TBEC: 40.0000 mg | DELAYED_RELEASE_TABLET | Freq: Every day | ORAL | Status: DC

## 2014-05-01 NOTE — Patient Instructions (Signed)
Exercise at least 4 times a week for 30 minutes each time as discussed. Hemoccult 1. Call with progress report in 4 weeks.

## 2014-05-01 NOTE — Progress Notes (Signed)
Presenting complaint;   follow-up early satiety and weight loss.  Patient complains of epigastric pain.  Subjective:   patient is 69 year old Caucasian female who is here for scheduled visit. She was initially seen on 07/27/2013 for 10 pound weight loss and early satiety. She had normal CBC and chemistry panel and her TSH level was normal while on levothyroxine. EGD on 07/28/2016 was negative for peptic ulcer disease or H. Pylori infection. Abdominopelvic CT on 08/08/2013 was unremarkable. Patient symptoms did not improve off Actonel.  She was last seen on 09/19/2013.  Patient states her appetite has not changed. She gets filled up quickly. She only eats half of a meal at a time. She eats 2 meals a day and few snacks in between. She never has a good appetite.  She received  IV zoledronic acid on 11/28/2013 to prevent bone loss.  She also complains of epigastric pain that she is at almost daily for 2 months. There is no change in this pain with meals. However she does experience nausea after meals without vomiting. She denies melena or rectal bleeding. Her bowels are irregular. She has periods patient has normal stool and then she has diarrhea and/or constipation.  She does not take OTC NSAIDs.   Current Medications: Outpatient Encounter Prescriptions as of 05/01/2014  Medication Sig  . CALCIUM PO Take 500 mg by mouth daily.   . Carboxymethylcellulose Sodium (RETAINE CMC OP) Apply to eye 2 (two) times daily.  . citalopram (CELEXA) 10 MG tablet Take 1 tablet (10 mg total) by mouth daily.  . Cyanocobalamin (VITAMIN B12 PO) Take 2,000 mcg by mouth daily.  . fluticasone (FLONASE) 50 MCG/ACT nasal spray Place 2 sprays into both nostrils daily. (Patient taking differently: Place 2 sprays into both nostrils as needed. )  . levothyroxine (SYNTHROID, LEVOTHROID) 75 MCG tablet TAKE 1/2 TABLET BY MOUTH ON MON AND FRI. TAKE 1 TABLET ON TUES, WED, THUR, SAT, AND SUN.  . Multiple Vitamins-Minerals (ALIVE  WOMENS 50+) TABS Take 1 tablet by mouth every evening.     Objective: Blood pressure 112/74, pulse 72, temperature 98.4 F (36.9 C), temperature source Oral, resp. rate 18, height 5\' 3"  (1.6 m), weight 133 lb 1.6 oz (60.374 kg). Patient is alert and in no acute distress. Conjunctiva is pink. Sclera is nonicteric Oropharyngeal mucosa is normal. No neck masses or thyromegaly noted. Cardiac exam with regular rhythm normal S1 and S2. No murmur or gallop noted. Lungs are clear to auscultation. Abdomen is symmetrical. No abdominal bruits noted. Abdomen is soft with mild midepigastric tenderness to the right of midline. No organomegaly or masses noted.  No LE edema or clubbing noted.   Assessment:  #1. Early satiety and weight loss. This symptom has not changed since her last visit 09/19/2013. She had esophagogastroduodenoscopy in April last year and was negative for peptic ulcer disease or pyloric stenosis. She had 2 small gastric polyps and biopsy revealed hyperplastic etiology and H. Pylori stains were negative. Similarly abdominopelvic CT did not reveal any abnormality to account for symptoms. Actonel was discontinued but without any benefit.  it is reassuring to know that she has not lost any weight since her last visit 7 months ago.  She needs to exercise regularly as it might improve her appetite. #2.  Epigastric pain of 2 months duration. She  Has experienced some nausea which is primarily postprandial but no vomiting or melena reported. EGD in April last year was negative for peptic ulcer disease. She is status post cholecystectomy.  She possibly has dyspepsia. However if stool is quite positive and consider repeating an EGD.    Plan:   Hemoccult 1.  Pantoprazole 40 mg by mouth every morning.  Patient advised to exercise at least 4 times a week for 30 minutes each time as this might improve her appetite.  She will call with progress report in 4 weeks. If epigastric pain persists will  consider further evaluation.

## 2014-05-16 ENCOUNTER — Telehealth (INDEPENDENT_AMBULATORY_CARE_PROVIDER_SITE_OTHER): Payer: Self-pay | Admitting: *Deleted

## 2014-05-16 NOTE — Telephone Encounter (Signed)
   Diagnosis:    Result(s)   Card 1 : Negative:       Completed by: Higinio Grow,LPN   HEMOCCULT SENSA DEVELOPER: LOT#: [id number]  EXPIRATION DATE: 2016-05   HEMOCCULT SENSA CARD:  LOT#:  02/14 EXPIRATION DATE: 07/18   CARD CONTROL RESULTS:  POSITIVE: Positive NEGATIVE: Negative    ADDITIONAL COMMENTS: Patient was called and made aware of her result. Forwarded to [place] for review.

## 2014-05-16 NOTE — Telephone Encounter (Signed)
Hemoccults negative. 

## 2014-08-02 ENCOUNTER — Ambulatory Visit: Payer: 59 | Admitting: Family Medicine

## 2014-08-02 ENCOUNTER — Ambulatory Visit (INDEPENDENT_AMBULATORY_CARE_PROVIDER_SITE_OTHER): Payer: Medicare Other | Admitting: Family Medicine

## 2014-08-02 ENCOUNTER — Other Ambulatory Visit: Payer: Self-pay | Admitting: Family Medicine

## 2014-08-02 VITALS — BP 132/68 | Temp 99.6°F | Ht 63.0 in | Wt 131.0 lb

## 2014-08-02 DIAGNOSIS — J310 Chronic rhinitis: Secondary | ICD-10-CM

## 2014-08-02 DIAGNOSIS — J329 Chronic sinusitis, unspecified: Secondary | ICD-10-CM | POA: Diagnosis not present

## 2014-08-02 MED ORDER — HYDROCODONE-HOMATROPINE 5-1.5 MG/5ML PO SYRP: ORAL_SOLUTION | ORAL | Status: DC

## 2014-08-02 MED ORDER — AMOXICILLIN 500 MG PO CAPS: 500.0000 mg | ORAL_CAPSULE | Freq: Three times a day (TID) | ORAL | Status: DC

## 2014-08-02 NOTE — Progress Notes (Signed)
   Subjective:    Patient ID: MADONNA FLEGAL, female    DOB: 04-11-1945, 69 y.o.   MRN: [id number]  Cough This is a new problem. The current episode started in the past 7 days. Associated symptoms include a sore throat. Associated symptoms comments: diarrhea. Treatments tried: mucinex, aleve, tussin.    Pos exp to others who are sick  Coughing is bad, feeling bad  Coughing proud phlegm  Low gr fever  Some headache early on  Sore throat felt bad today dim energy   Review of Systems  HENT: Positive for sore throat.   Respiratory: Positive for cough.        Objective:   Physical Exam  Alert moderate malaise vital stable H&T moderate his congestion frontal tenderness voice very hoarse neck supple. Lungs clear. Heart regular in rhythm.      Assessment & Plan:  Impression post viral rhinosinusitis/laryngitis plan antibiotics prescribed. Symptom care discussed. Warning signs discussed WSL

## 2014-08-10 ENCOUNTER — Encounter: Payer: Self-pay | Admitting: Family Medicine

## 2014-08-10 ENCOUNTER — Ambulatory Visit (INDEPENDENT_AMBULATORY_CARE_PROVIDER_SITE_OTHER): Payer: Medicare Other | Admitting: Family Medicine

## 2014-08-10 VITALS — BP 112/70 | Temp 98.7°F | Ht 62.0 in | Wt 130.0 lb

## 2014-08-10 DIAGNOSIS — D510 Vitamin B12 deficiency anemia due to intrinsic factor deficiency: Secondary | ICD-10-CM

## 2014-08-10 DIAGNOSIS — E785 Hyperlipidemia, unspecified: Secondary | ICD-10-CM

## 2014-08-10 DIAGNOSIS — Z79899 Other long term (current) drug therapy: Secondary | ICD-10-CM

## 2014-08-10 DIAGNOSIS — J329 Chronic sinusitis, unspecified: Secondary | ICD-10-CM

## 2014-08-10 DIAGNOSIS — J310 Chronic rhinitis: Secondary | ICD-10-CM

## 2014-08-10 DIAGNOSIS — E038 Other specified hypothyroidism: Secondary | ICD-10-CM

## 2014-08-10 DIAGNOSIS — J202 Acute bronchitis due to streptococcus: Secondary | ICD-10-CM | POA: Diagnosis not present

## 2014-08-10 MED ORDER — CEFPROZIL 500 MG PO TABS: 500.0000 mg | ORAL_TABLET | Freq: Two times a day (BID) | ORAL | Status: DC

## 2014-08-10 MED ORDER — PREDNISONE 20 MG PO TABS: ORAL_TABLET | ORAL | Status: DC

## 2014-08-10 MED ORDER — CEFTRIAXONE SODIUM 1 G IJ SOLR
500.0000 mg | Freq: Once | INTRAMUSCULAR | Status: AC
Start: 2014-08-10 — End: 2014-08-10
  Administered 2014-08-10: 500 mg via INTRAMUSCULAR

## 2014-08-10 NOTE — Progress Notes (Signed)
   Subjective:    Patient ID: Grace [name], female    DOB: 08-12-1945, 69 y.o.   MRN: [id number]  Cough This is a new problem. The current episode started 1 to 4 weeks ago. Associated symptoms include a fever and rhinorrhea. Pertinent negatives include no chest pain, ear pain, shortness of breath or wheezing. Associated symptoms comments: Nausea, sweating, abd pain, fatigue, no appetitie.   Stopped amoxil because it was causing diarrhea.    Patient relates despite trying her medication she had ongoing trouble with cough sinus pressure not feeling good  Review of Systems  Constitutional: Positive for fever. Negative for activity change.  HENT: Positive for congestion and rhinorrhea. Negative for ear pain.   Eyes: Negative for discharge.  Respiratory: Positive for cough. Negative for shortness of breath and wheezing.   Cardiovascular: Negative for chest pain.       Objective:   Physical Exam  Constitutional: She appears well-developed.  HENT:  Head: Normocephalic.  Nose: Nose normal.  Mouth/Throat: Oropharynx is clear and moist. No oropharyngeal exudate.  Neck: Neck supple.  Cardiovascular: Normal rate and normal heart sounds.   No murmur heard. Pulmonary/Chest: Effort normal and breath sounds normal. She has no wheezes.  Lymphadenopathy:    She has no cervical adenopathy.  Skin: Skin is warm and dry.  Nursing note and vitals reviewed.         Assessment & Plan:  Viral illness Secondary sinusitis Bronchitis Shot of antibiotics Second round of antibiotics Warning signs discussed.  Chronic lab work ordered for a follow up on the 24th

## 2014-08-16 ENCOUNTER — Encounter: Payer: Self-pay | Admitting: Family Medicine

## 2014-08-16 LAB — CBC WITH DIFFERENTIAL/PLATELET
Basophils Absolute: 0 10*3/uL (ref 0.0–0.2)
Basos: 0 %
EOS (ABSOLUTE): 0.1 10*3/uL (ref 0.0–0.4)
Eos: 2 %
Hematocrit: 43.9 % (ref 34.0–46.6)
Hemoglobin: 14.4 g/dL (ref 11.1–15.9)
Immature Grans (Abs): 0 10*3/uL (ref 0.0–0.1)
Immature Granulocytes: 0 %
Lymphocytes Absolute: 2.1 10*3/uL (ref 0.7–3.1)
Lymphs: 33 %
MCH: 29.6 pg (ref 26.6–33.0)
MCHC: 32.8 g/dL (ref 31.5–35.7)
MCV: 90 fL (ref 79–97)
Monocytes Absolute: 0.5 10*3/uL (ref 0.1–0.9)
Monocytes: 8 %
Neutrophils Absolute: 3.5 10*3/uL (ref 1.4–7.0)
Neutrophils: 57 %
Platelets: 295 10*3/uL (ref 150–379)
RBC: 4.86 x10E6/uL (ref 3.77–5.28)
RDW: 13 % (ref 12.3–15.4)
WBC: 6.3 10*3/uL (ref 3.4–10.8)

## 2014-08-16 LAB — LIPID PANEL
Chol/HDL Ratio: 5.3 ratio units — ABNORMAL HIGH (ref 0.0–4.4)
Cholesterol, Total: 200 mg/dL — ABNORMAL HIGH (ref 100–199)
HDL: 38 mg/dL — ABNORMAL LOW (ref >39)
LDL Calculated: 136 mg/dL — ABNORMAL HIGH (ref 0–99)
Triglycerides: 128 mg/dL (ref 0–149)
VLDL Cholesterol Cal: 26 mg/dL (ref 5–40)

## 2014-08-16 LAB — BASIC METABOLIC PANEL
BUN/Creatinine Ratio: 14 (ref 11–26)
BUN: 12 mg/dL (ref 8–27)
CO2: 31 mmol/L — ABNORMAL HIGH (ref 18–29)
Calcium: 9 mg/dL (ref 8.7–10.3)
Chloride: 98 mmol/L (ref 97–108)
Creatinine, Ser: 0.85 mg/dL (ref 0.57–1.00)
GFR calc Af Amer: 82 mL/min/{1.73_m2} (ref >59)
GFR calc non Af Amer: 71 mL/min/{1.73_m2} (ref >59)
Glucose: 91 mg/dL (ref 65–99)
Potassium: 4.6 mmol/L (ref 3.5–5.2)
Sodium: 139 mmol/L (ref 134–144)

## 2014-08-16 LAB — VITAMIN B12: Vitamin B-12: 659 pg/mL (ref 211–946)

## 2014-08-16 LAB — TSH: TSH: 3.84 u[IU]/mL (ref 0.450–4.500)

## 2014-08-22 ENCOUNTER — Ambulatory Visit (HOSPITAL_COMMUNITY)
Admission: RE | Admit: 2014-08-22 | Discharge: 2014-08-22 | Disposition: A | Discharge status: Home / Self Care | Payer: Medicare Other | Source: Ambulatory Visit | Attending: Family Medicine | Admitting: Family Medicine

## 2014-08-22 ENCOUNTER — Ambulatory Visit (INDEPENDENT_AMBULATORY_CARE_PROVIDER_SITE_OTHER): Payer: Medicare Other | Admitting: Family Medicine

## 2014-08-22 ENCOUNTER — Encounter: Payer: Self-pay | Admitting: Family Medicine

## 2014-08-22 VITALS — BP 136/84 | Ht 62.0 in | Wt 129.6 lb

## 2014-08-22 DIAGNOSIS — R0602 Shortness of breath: Secondary | ICD-10-CM | POA: Diagnosis present

## 2014-08-22 DIAGNOSIS — J202 Acute bronchitis due to streptococcus: Secondary | ICD-10-CM | POA: Diagnosis not present

## 2014-08-22 DIAGNOSIS — R05 Cough: Secondary | ICD-10-CM | POA: Insufficient documentation

## 2014-08-22 DIAGNOSIS — D510 Vitamin B12 deficiency anemia due to intrinsic factor deficiency: Secondary | ICD-10-CM

## 2014-08-22 DIAGNOSIS — E785 Hyperlipidemia, unspecified: Secondary | ICD-10-CM | POA: Diagnosis not present

## 2014-08-22 DIAGNOSIS — E038 Other specified hypothyroidism: Secondary | ICD-10-CM

## 2014-08-22 DIAGNOSIS — R5383 Other fatigue: Secondary | ICD-10-CM | POA: Diagnosis not present

## 2014-08-22 MED ORDER — LEVOTHYROXINE SODIUM 75 MCG PO TABS: ORAL_TABLET | ORAL | Status: DC

## 2014-08-22 NOTE — Progress Notes (Signed)
   Subjective:    Patient ID: Grace [name], female    DOB: [birth year], 69 y.o.   MRN: [id number]  Cough This is a recurrent problem. The current episode started 1 to 4 weeks ago. The problem has been waxing and waning. The problem occurs constantly. The cough is non-productive. Associated symptoms include chills and shortness of breath. Pertinent negatives include no chest pain, fever, hemoptysis, sore throat, sweats or wheezing. The symptoms are aggravated by exercise. The treatment provided no relief. There is no history of asthma.   Patient arrives for a follow up on depression and hypothyroidism. Patient has continued cough and congestion despite finishing antibiotics for bronchitis.   Review of Systems  Constitutional: Positive for chills. Negative for fever, activity change, appetite change and fatigue.  HENT: Negative for congestion and sore throat.   Respiratory: Positive for shortness of breath. Negative for cough, hemoptysis and wheezing.   Cardiovascular: Negative for chest pain.  Gastrointestinal: Negative for abdominal pain.  Endocrine: Negative for polydipsia and polyphagia.  Neurological: Negative for weakness.  Psychiatric/Behavioral: Negative for confusion.       Objective:   Physical Exam  Constitutional: She appears well-nourished. No distress.  Cardiovascular: Normal rate, regular rhythm and normal heart sounds.   No murmur heard. Pulmonary/Chest: Effort normal and breath sounds normal. No respiratory distress.  Musculoskeletal: She exhibits no edema.  Lymphadenopathy:    She has no cervical adenopathy.  Neurological: She is alert. She exhibits normal muscle tone.  Psychiatric: Her behavior is normal.  Vitals reviewed.         Assessment & Plan:  Persistent illness recommend importance of stair warnings antibody and if ongoing troubles may need other intervention x-rays ordered await results warnings discuss

## 2014-08-22 NOTE — Patient Instructions (Signed)
As for the thyroid start doing 1/2 on Monday and 1 on the other days  Recheck your labs in mid Oct to start of Nov

## 2014-08-23 ENCOUNTER — Other Ambulatory Visit: Payer: Self-pay

## 2014-08-23 MED ORDER — PREDNISONE 20 MG PO TABS: ORAL_TABLET | ORAL | Status: DC

## 2014-08-23 MED ORDER — DOXYCYCLINE HYCLATE 100 MG PO TABS: 100.0000 mg | ORAL_TABLET | Freq: Two times a day (BID) | ORAL | Status: DC

## 2014-10-04 ENCOUNTER — Telehealth: Payer: Self-pay | Admitting: Family Medicine

## 2014-10-04 NOTE — Telephone Encounter (Signed)
error 

## 2014-10-30 ENCOUNTER — Other Ambulatory Visit: Payer: Self-pay | Admitting: Family Medicine

## 2014-12-01 ENCOUNTER — Encounter (HOSPITAL_COMMUNITY): Payer: Medicare Other

## 2014-12-01 ENCOUNTER — Other Ambulatory Visit (HOSPITAL_COMMUNITY): Payer: Medicare Other

## 2014-12-06 ENCOUNTER — Encounter (HOSPITAL_COMMUNITY): Payer: Self-pay

## 2014-12-06 ENCOUNTER — Encounter (HOSPITAL_COMMUNITY)
Admission: RE | Admit: 2014-12-06 | Discharge: 2014-12-06 | Disposition: A | Discharge status: Home / Self Care | Payer: Medicare Other | Source: Ambulatory Visit | Attending: Family Medicine | Admitting: Family Medicine

## 2014-12-06 ENCOUNTER — Encounter: Payer: Self-pay | Admitting: Family Medicine

## 2014-12-06 DIAGNOSIS — M810 Age-related osteoporosis without current pathological fracture: Secondary | ICD-10-CM | POA: Insufficient documentation

## 2014-12-06 LAB — COMPREHENSIVE METABOLIC PANEL
ALT: 20 U/L (ref 14–54)
AST: 29 U/L (ref 15–41)
Albumin: 4 g/dL (ref 3.5–5.0)
Alkaline Phosphatase: 59 U/L (ref 38–126)
Anion gap: 5 (ref 5–15)
BUN: 13 mg/dL (ref 6–20)
CO2: 28 mmol/L (ref 22–32)
Calcium: 8.9 mg/dL (ref 8.9–10.3)
Chloride: 106 mmol/L (ref 101–111)
Creatinine, Ser: 0.74 mg/dL (ref 0.44–1.00)
GFR calc Af Amer: >60 mL/min (ref >60)
GFR calc non Af Amer: >60 mL/min (ref >60)
Glucose, Bld: 85 mg/dL (ref 65–99)
Potassium: 3.8 mmol/L (ref 3.5–5.1)
Sodium: 139 mmol/L (ref 135–145)
Total Bilirubin: 0.9 mg/dL (ref 0.3–1.2)
Total Protein: 7.1 g/dL (ref 6.5–8.1)

## 2014-12-06 LAB — PHOSPHORUS: Phosphorus: 3.4 mg/dL (ref 2.5–4.6)

## 2014-12-06 LAB — MAGNESIUM: Magnesium: 1.9 mg/dL (ref 1.7–2.4)

## 2014-12-06 MED ORDER — ZOLEDRONIC ACID 5 MG/100ML IV SOLN
5.0000 mg | Freq: Once | INTRAVENOUS | Status: AC
  Administered 2014-12-06: 5 mg via INTRAVENOUS
  Filled 2014-12-06: qty 100

## 2014-12-06 MED ORDER — SODIUM CHLORIDE 0.9 % IV SOLN
Freq: Once | INTRAVENOUS | Status: AC
  Administered 2014-12-06: 250 mL via INTRAVENOUS

## 2014-12-06 NOTE — Progress Notes (Signed)
Results for Grace [name], Grace [name] (MRN [id number]) as of 12/06/2014 10:23  Reclast infusion today, tolerated well. Next appointment 12/06/2015    Ref. Range 12/06/2014 08:54 12/06/2014 08:55  Sodium Latest Ref Range: 135-145 mmol/L 139   Potassium Latest Ref Range: 3.5-5.1 mmol/L 3.8   Chloride Latest Ref Range: 101-111 mmol/L 106   CO2 Latest Ref Range: 22-32 mmol/L 28   BUN Latest Ref Range: 6-20 mg/dL 13   Creatinine Latest Ref Range: 0.44-1.00 mg/dL 0.74   Calcium Latest Ref Range: 8.9-10.3 mg/dL 8.9   EGFR (Non-African Amer.) Latest Ref Range: >60 mL/min >60   EGFR (African American) Latest Ref Range: >60 mL/min >60   Glucose Latest Ref Range: 65-99 mg/dL 85   Anion gap Latest Ref Range: 5-15  5   Phosphorus Latest Ref Range: 2.5-4.6 mg/dL  3.4  Magnesium Latest Ref Range: 1.7-2.4 mg/dL  1.9  Alkaline Phosphatase Latest Ref Range: 38-126 U/L 59   Albumin Latest Ref Range: 3.5-5.0 g/dL 4.0   AST Latest Ref Range: 15-41 U/L 29   ALT Latest Ref Range: 14-54 U/L 20   Total Protein Latest Ref Range: 6.5-8.1 g/dL 7.1   Total Bilirubin Latest Ref Range: 0.3-1.2 mg/dL 0.9

## 2014-12-06 NOTE — Discharge Instructions (Signed)
Zoledronic Acid injection (Paget's Disease, Osteoporosis) °What is this medicine? °ZOLEDRONIC ACID (ZOE le dron ik AS id) lowers the amount of calcium loss from bone. It is used to treat Paget's disease and osteoporosis in women. °This medicine may be used for other purposes; ask your health care provider or pharmacist if you have questions. °COMMON BRAND NAME(S): Reclast, Zometa °What should I tell my health care provider before I take this medicine? °They need to know if you have any of these conditions: °-aspirin-sensitive asthma °-cancer, especially if you are receiving medicines used to treat cancer °-dental disease or wear dentures °-infection °-kidney disease °-low levels of calcium in the blood °-past surgery on the parathyroid gland or intestines °-receiving corticosteroids like dexamethasone or prednisone °-an unusual or allergic reaction to zoledronic acid, other medicines, foods, dyes, or preservatives °-pregnant or trying to get pregnant °-breast-feeding °How should I use this medicine? °This medicine is for infusion into a vein. It is given by a health care professional in a hospital or clinic setting. °Talk to your pediatrician regarding the use of this medicine in children. This medicine is not approved for use in children. °Overdosage: If you think you have taken too much of this medicine contact a poison control center or emergency room at once. °NOTE: This medicine is only for you. Do not share this medicine with others. °What if I miss a dose? °It is important not to miss your dose. Call your doctor or health care professional if you are unable to keep an appointment. °What may interact with this medicine? °-certain antibiotics given by injection °-NSAIDs, medicines for pain and inflammation, like ibuprofen or naproxen °-some diuretics like bumetanide, furosemide °-teriparatide °This list may not describe all possible interactions. Give your health care provider a list of all the medicines,  herbs, non-prescription drugs, or dietary supplements you use. Also tell them if you smoke, drink alcohol, or use illegal drugs. Some items may interact with your medicine. °What should I watch for while using this medicine? °Visit your doctor or health care professional for regular checkups. It may be some time before you see the benefit from this medicine. Do not stop taking your medicine unless your doctor tells you to. Your doctor may order blood tests or other tests to see how you are doing. °Women should inform their doctor if they wish to become pregnant or think they might be pregnant. There is a potential for serious side effects to an unborn child. Talk to your health care professional or pharmacist for more information. °You should make sure that you get enough calcium and vitamin D while you are taking this medicine. Discuss the foods you eat and the vitamins you take with your health care professional. °Some people who take this medicine have severe bone, joint, and/or muscle pain. This medicine may also increase your risk for jaw problems or a broken thigh bone. Tell your doctor right away if you have severe pain in your jaw, bones, joints, or muscles. Tell your doctor if you have any pain that does not go away or that gets worse. °Tell your dentist and dental surgeon that you are taking this medicine. You should not have major dental surgery while on this medicine. See your dentist to have a dental exam and fix any dental problems before starting this medicine. Take good care of your teeth while on this medicine. Make sure you see your dentist for regular follow-up appointments. °What side effects may I notice from receiving this medicine? °  Side effects that you should report to your doctor or health care professional as soon as possible: -allergic reactions like skin rash, itching or hives, swelling of the face, lips, or tongue -anxiety, confusion, or depression -breathing problems -changes in  vision -eye pain -feeling faint or lightheaded, falls -jaw pain, especially after dental work -mouth sores -muscle cramps, stiffness, or weakness -trouble passing urine or change in the amount of urine Side effects that usually do not require medical attention (report to your doctor or health care professional if they continue or are bothersome): -bone, joint, or muscle pain -constipation -diarrhea -fever -hair loss -irritation at site where injected -loss of appetite -nausea, vomiting -stomach upset -trouble sleeping -trouble swallowing -weak or tired This list may not describe all possible side effects. Call your doctor for medical advice about side effects. You may report side effects to FDA at 1-800-FDA-1088. Where should I keep my medicine? This drug is given in a hospital or clinic and will not be stored at home. NOTE: This sheet is a summary. It may not cover all possible information. If you have questions about this medicine, talk to your doctor, pharmacist, or health care provider.  2015, Elsevier/Gold Standard. (2012-08-30 10:03:48) Osteoporosis Osteoporosis happens when your bones become weak because of bone loss. Weak bones can break (fracture) more easily with slips or falls. You are more likely to develop osteoporosis if:  You are a woman.  You are older than 50 years.  You are white or Asian.  You are very thin.  Someone in your family has had osteoporosis.  You smoke or use nicotine. CAUSES   Smoking.  Too much drinking.  Being a weight below normal.  Not being active.  Not going outside in the sun enough.  Certain medical conditions, such as diabetes or Crohn disease.  Certain medicines, such as steroids or antiseizure medicines. TREATMENT  The goal of treatment is to strengthen bones. There are different types of medicines that help your bones. Some medicines make your bones more solid. Some medicines help to slow down how much bone you lose.  Your doctor may check to see if you are getting enough calcium and vitamin D in your diet. PREVENTION   Make sure you get enough calcium and vitamin D.  Make sure you exercise often.  If you smoke, quit. MAKE SURE YOU:  Understand these instructions.  Will watch your condition.  Will get help right away if you are not doing well or get worse. Document Released: 06/09/2011 Document Reviewed: 06/09/2011 [facility] Patient Information 2015 [place]. This information is not intended to replace advice given to you by your health care provider. Make sure you discuss any questions you have with your health care provider.

## 2014-12-29 ENCOUNTER — Telehealth: Payer: Self-pay | Admitting: Family Medicine

## 2014-12-29 DIAGNOSIS — E7800 Pure hypercholesterolemia, unspecified: Secondary | ICD-10-CM

## 2014-12-29 NOTE — Telephone Encounter (Signed)
Has appt 01/15/15 for a 5 month follow up, does she need any labs done?  Please advise and notify patient

## 2015-01-02 NOTE — Telephone Encounter (Signed)
Pt.notified

## 2015-01-02 NOTE — Telephone Encounter (Signed)
[facility] (Lipid profile ordered in epic)

## 2015-01-02 NOTE — Telephone Encounter (Signed)
Recommend a fasting lipid profile. Other labs are up to date.

## 2015-01-09 LAB — LIPID PANEL
Chol/HDL Ratio: 5.9 ratio units — ABNORMAL HIGH (ref 0.0–4.4)
Cholesterol, Total: 232 mg/dL — ABNORMAL HIGH (ref 100–199)
HDL: 39 mg/dL — ABNORMAL LOW (ref >39)
LDL Calculated: 169 mg/dL — ABNORMAL HIGH (ref 0–99)
Triglycerides: 120 mg/dL (ref 0–149)
VLDL Cholesterol Cal: 24 mg/dL (ref 5–40)

## 2015-01-15 ENCOUNTER — Telehealth: Payer: Self-pay | Admitting: Nurse Practitioner

## 2015-01-15 ENCOUNTER — Ambulatory Visit (INDEPENDENT_AMBULATORY_CARE_PROVIDER_SITE_OTHER): Payer: Medicare Other | Admitting: Nurse Practitioner

## 2015-01-15 ENCOUNTER — Encounter: Payer: Self-pay | Admitting: Nurse Practitioner

## 2015-01-15 VITALS — BP 118/78 | Wt 138.0 lb

## 2015-01-15 DIAGNOSIS — Z23 Encounter for immunization: Secondary | ICD-10-CM

## 2015-01-15 DIAGNOSIS — E785 Hyperlipidemia, unspecified: Secondary | ICD-10-CM | POA: Diagnosis not present

## 2015-01-15 MED ORDER — FENOFIBRATE 120 MG PO TABS: ORAL_TABLET | ORAL | Status: DC

## 2015-01-15 NOTE — Telephone Encounter (Signed)
She will need to discuss with pharmacist if dose is different from prescribed especially if "equivalent" dose.

## 2015-01-15 NOTE — Progress Notes (Signed)
Subjective:  Presents for recheck and discuss recent lipid profile. Has a history of TIA in 2013. No current symptoms. No CP/ischemic type pain or SOB. Both her mother and mgrandmother had strokes. Does not smoke. Fairly well with diet but does eat high fat breakfast several times per month. Could not take statins due to severe myopathy, including Crestor. Stopped taking low dose aspirin several years ago when she had a lesion removed. States she was instructed by Dr. Romona Curls to stop since she bled for a long time after procedure. No unusual bruising.   Objective:   BP 118/78 mmHg  Wt 138 lb (62.596 kg) NAD. Alert, oriented. Lungs clear. Heart RRR. No murmur or gallop noted. Carotids no bruits or thrills.  Results for orders placed or performed in visit on 12/29/14  Lipid panel  Result Value Ref Range   Cholesterol, Total 232 (H) 100 - 199 mg/dL   Triglycerides 120 0 - 149 mg/dL   HDL 39 (L) >39 mg/dL   VLDL Cholesterol Cal 24 5 - 40 mg/dL   LDL Calculated 169 (H) 0 - 99 mg/dL   Chol/HDL Ratio 5.9 (H) 0.0 - 4.4 ratio units    Assessment:  Problem List Items Addressed This Visit      Other   Hyperlipemia - Primary   Relevant Medications   Fenofibrate 120 MG TABS   Other Relevant Orders   Lipid panel   Hepatic function panel    Other Visit Diagnoses    Encounter for immunization          Plan:  Meds ordered this encounter  Medications  . Tetrahydroz-Dextran-PEG-Povid (EYE DROPS ADVANCED RELIEF OP)    Sig: Apply to eye. Take as needed for dry eyes  . Fenofibrate 120 MG TABS    Sig: One po qd for cholesterol    Dispense:  30 each    Refill:  2    Order Specific Question:  Supervising Provider    Answer:  Mikey Kirschner [[id number]]   Discussed importance of getting LDL as close to 100 or below to reduce risk of stroke and heart attack. Trial of fenofibrate. Low fat, low cholesterol diet. Repeat labs in 8-10 weeks. Restart low dose 81 mg EC aspirin.  Schedule PE before the end  of the year.

## 2015-01-15 NOTE — Telephone Encounter (Signed)
Fenofibrate 120 MG TABS  Pt is calling about this script you sent in today, she states  The bottle she picked up is 105 mg?   [place] apoth   Bottle says its an equivalent

## 2015-01-16 NOTE — Telephone Encounter (Signed)
Called patient and informed her per Ardine Eng will need to discuss with pharmacist if dose is different from prescribed especially if "equivalent" dose. Patient verbalized understanding and stated that she would check with the pharmacist.

## 2015-01-23 ENCOUNTER — Ambulatory Visit: Payer: Medicare Other | Admitting: Family Medicine

## 2015-03-05 ENCOUNTER — Encounter: Payer: Medicare Other | Admitting: Nurse Practitioner

## 2015-03-08 ENCOUNTER — Encounter: Payer: Self-pay | Admitting: Nurse Practitioner

## 2015-03-08 ENCOUNTER — Ambulatory Visit (INDEPENDENT_AMBULATORY_CARE_PROVIDER_SITE_OTHER): Payer: Medicare Other | Admitting: Nurse Practitioner

## 2015-03-08 VITALS — BP 132/74 | Ht 62.0 in | Wt 138.0 lb

## 2015-03-08 DIAGNOSIS — Z0000 Encounter for general adult medical examination without abnormal findings: Secondary | ICD-10-CM | POA: Diagnosis not present

## 2015-03-08 DIAGNOSIS — Z23 Encounter for immunization: Secondary | ICD-10-CM | POA: Diagnosis not present

## 2015-03-08 DIAGNOSIS — E785 Hyperlipidemia, unspecified: Secondary | ICD-10-CM

## 2015-03-08 DIAGNOSIS — R350 Frequency of micturition: Secondary | ICD-10-CM | POA: Diagnosis not present

## 2015-03-08 LAB — POCT URINALYSIS DIPSTICK
Spec Grav, UA: 1.02
pH, UA: 5

## 2015-03-08 LAB — POCT UA - MICROSCOPIC ONLY
Bacteria, U Microscopic: NEGATIVE
Epithelial cells, urine per micros: NEGATIVE
RBC, urine, microscopic: NEGATIVE
WBC, Ur, HPF, POC: NEGATIVE

## 2015-03-08 NOTE — Progress Notes (Signed)
Subjective:    Patient ID: Grace [name], female    DOB: 26-Sep-1945, 69 y.o.   MRN: [id number]  HPI AWV- Annual Wellness Visit  The patient was seen for their annual wellness visit. The patient's past medical history, surgical history, and family history were reviewed. Pertinent vaccines were reviewed ( tetanus, pneumonia, shingles, flu) The patient's medication list was reviewed and updated.  The height and weight were entered. The patient's current BMI is: 25.24 Cognitive screening was completed. Outcome of Mini - Cog: pass  Falls within the past 6 months: none  Current tobacco usage: none (All patients who use tobacco were given written and verbal information on quitting)  Recent listing of emergency department/hospitalizations over the past year were reviewed.  current specialist the patient sees on a regular basis: none   Medicare annual wellness visit patient questionnaire was reviewed.  A written screening schedule for the patient for the next 5-10 years was given. Appropriate discussion of followup regarding next visit was discussed.  Having frequent urination for awhile now.   Has not started taking fenofibrate that was prescribed. Has some concerns with the medication.  Regular vision and dental exams. Same sexual partner. Healthy diet. Regular exercise. Still on Reclast once a year.     Review of Systems  Constitutional: Negative for fever, activity change, appetite change and fatigue.  HENT: Negative for dental problem, ear pain, sinus pressure and sore throat.   Respiratory: Negative for cough, chest tightness, shortness of breath and wheezing.   Cardiovascular: Negative for chest pain.  Gastrointestinal: Negative for nausea, vomiting, abdominal pain, diarrhea, constipation and abdominal distention.  Genitourinary: Positive for frequency. Negative for dysuria, urgency, vaginal discharge, enuresis, difficulty urinating, genital sores, menstrual problem and  pelvic pain.       Objective:   Physical Exam  Constitutional: She is oriented to person, place, and time. She appears well-developed. No distress.  HENT:  Right Ear: External ear normal.  Left Ear: External ear normal.  Mouth/Throat: Oropharynx is clear and moist.  Neck: Normal range of motion. Neck supple. No tracheal deviation present. No thyromegaly present.  Left thyroid surgically absent; right side no tenderness or masses.  Cardiovascular: Normal rate, regular rhythm and normal heart sounds.  Exam reveals no gallop.   No murmur heard. Pulmonary/Chest: Effort normal and breath sounds normal.  Abdominal: Soft. She exhibits no distension. There is no tenderness.  Genitourinary: Vagina normal and uterus normal. No vaginal discharge found.  External GU: no rashes or lesions. Vagina: no discharge. Bimanual exam: no tenderness or obvious masses. Rectal exam: no masses; no stool for hemoccult.   Musculoskeletal: She exhibits no edema.  Lymphadenopathy:    She has no cervical adenopathy.  Neurological: She is alert and oriented to person, place, and time.  Skin: Skin is warm and dry. No rash noted.  Psychiatric: She has a normal mood and affect. Her behavior is normal.  Breast exam: no masses; axillae no adenopathy. Results for orders placed or performed in visit on 03/08/15  Urine culture  Result Value Ref Range   Urine Culture, Routine Final report    Urine Culture result 1 Comment   Please Note  Result Value Ref Range   Please note Comment   POCT urinalysis dipstick  Result Value Ref Range   Color, UA     Clarity, UA     Glucose, UA     Bilirubin, UA     Ketones, UA     Spec Grav, UA  1.020    Blood, UA     pH, UA 5.0    Protein, UA     Urobilinogen, UA     Nitrite, UA     Leukocytes, UA  Negative  POCT UA - Microscopic Only  Result Value Ref Range   WBC, Ur, HPF, POC neg    RBC, urine, microscopic neg    Bacteria, U Microscopic neg    Mucus, UA     Epithelial  cells, urine per micros neg    Crystals, Ur, HPF, POC     Casts, Ur, LPF, POC     Yeast, UA     See lipid profile dated 01/08/15.       Assessment & Plan:   Problem List Items Addressed This Visit      Other   Hyperlipemia    Other Visit Diagnoses    Routine general medical examination at a health care facility    -  Primary    Relevant Orders    POC Hemoccult Bld/Stl (3-Cd Home Screen)    Frequent urination        Relevant Orders    POCT urinalysis dipstick (Completed)    Urine culture (Completed)    POCT UA - Microscopic Only (Completed)    Need for vaccination           Urine culture pending. Recommend daily vitamin D and calcium supplementation. Recommend DEXA scan. Discussed concerns regarding fenofibrate. Patient agrees to restart then obtain labs in 10-12 weeks. Return in about 6 months (around 09/06/2015) for recheck.

## 2015-03-08 NOTE — Patient Instructions (Signed)
Replens vaginal moisturizer Take Zantac 150 mg daily for acid reflux as needed

## 2015-03-10 LAB — URINE CULTURE

## 2015-03-10 LAB — PLEASE NOTE

## 2015-03-12 ENCOUNTER — Encounter: Payer: Self-pay | Admitting: Nurse Practitioner

## 2015-03-13 ENCOUNTER — Telehealth: Payer: Self-pay | Admitting: *Deleted

## 2015-03-13 ENCOUNTER — Other Ambulatory Visit: Payer: Self-pay | Admitting: *Deleted

## 2015-03-13 DIAGNOSIS — M810 Age-related osteoporosis without current pathological fracture: Secondary | ICD-10-CM

## 2015-03-13 NOTE — Telephone Encounter (Signed)
Spoke with patient and informed her of appointment for bone density test scheduled aph. Wednesday dec 21st register 8:15 am for an 8:30 appt. Bring list of meds and no calcium 48 hours prior to test. Patient verbalized understanding.

## 2015-03-13 NOTE — Telephone Encounter (Signed)
[facility] - bone density test scheduled aph. Wednesday dec 21st register 8:15 am for an 8:30 appt. Bring list of meds and no calcium 48 hours prior to test.

## 2015-03-21 ENCOUNTER — Ambulatory Visit (HOSPITAL_COMMUNITY)
Admission: RE | Admit: 2015-03-21 | Discharge: 2015-03-21 | Disposition: A | Discharge status: Home / Self Care | Payer: Medicare Other | Source: Ambulatory Visit | Attending: Nurse Practitioner | Admitting: Nurse Practitioner

## 2015-03-21 DIAGNOSIS — Z780 Asymptomatic menopausal state: Secondary | ICD-10-CM | POA: Insufficient documentation

## 2015-03-21 DIAGNOSIS — Z79899 Other long term (current) drug therapy: Secondary | ICD-10-CM | POA: Insufficient documentation

## 2015-03-21 DIAGNOSIS — M810 Age-related osteoporosis without current pathological fracture: Secondary | ICD-10-CM | POA: Insufficient documentation

## 2015-03-29 ENCOUNTER — Ambulatory Visit (INDEPENDENT_AMBULATORY_CARE_PROVIDER_SITE_OTHER): Payer: Medicare Other | Admitting: Family Medicine

## 2015-03-29 ENCOUNTER — Encounter: Payer: Self-pay | Admitting: Family Medicine

## 2015-03-29 VITALS — Temp 97.6°F | Ht 62.0 in | Wt 135.8 lb

## 2015-03-29 DIAGNOSIS — E038 Other specified hypothyroidism: Secondary | ICD-10-CM

## 2015-03-29 DIAGNOSIS — S20212A Contusion of left front wall of thorax, initial encounter: Secondary | ICD-10-CM | POA: Diagnosis not present

## 2015-03-29 DIAGNOSIS — E7800 Pure hypercholesterolemia, unspecified: Secondary | ICD-10-CM

## 2015-03-29 MED ORDER — MELOXICAM 15 MG PO TABS: 15.0000 mg | ORAL_TABLET | Freq: Every day | ORAL | Status: DC

## 2015-03-29 MED ORDER — ALPRAZOLAM 0.25 MG PO TABS: 0.2500 mg | ORAL_TABLET | Freq: Two times a day (BID) | ORAL | Status: DC | PRN

## 2015-03-29 MED ORDER — AZITHROMYCIN 250 MG PO TABS: ORAL_TABLET | ORAL | Status: DC

## 2015-03-29 NOTE — Progress Notes (Signed)
   Subjective:    Patient ID: Grace [name], female    DOB: Mar 23, 1946, 69 y.o.   MRN: [id number]  HPI  Patient arrives with c/o pain around ribs for 2 weeks- patient has also had a cough.  patient states her grandson tripped and fell into her ribs striking the left side of the ribs over the past couple weeks and send when she takes a deep breath it hurts she denies shortness of breath has an occasional cough but nothing severe denies any fever denies hemoptysis PMH benign under a mild amount of stress with some family issues Review of Systems  Constitutional: Negative for fever and activity change.  HENT: Positive for congestion and rhinorrhea. Negative for ear pain.   Eyes: Negative for discharge.  Respiratory: Positive for cough. Negative for shortness of breath and wheezing.   Cardiovascular: Negative for chest pain.       Objective:   Physical Exam  Constitutional: She appears well-developed.  HENT:  Head: Normocephalic.  Nose: Nose normal.  Mouth/Throat: Oropharynx is clear and moist. No oropharyngeal exudate.  Neck: Neck supple.  Cardiovascular: Normal rate and normal heart sounds.   No murmur heard. Pulmonary/Chest: Effort normal and breath sounds normal. She has no wheezes.  Lymphadenopathy:    She has no cervical adenopathy.  Skin: Skin is warm and dry.  Nursing note and vitals reviewed.   patient went tenderness along the left lateral rib area. Lungs sounds on that side are perfectly normal. No respiratory distress.       Assessment & Plan:   I don't feel x-rays would be beneficial I discussed this with the patient she agrees we will hold off on those for now she may use anti-inflammatory over the course of the next couple weeks if not dramatically better she will notify us    patient with hyperlipidemia and hypothyroidism check lab work in January follow-up in the spring She will use Xanax only occasionally as necessary to help with anxiety related issues caution  drowsiness not for frequent use

## 2015-04-03 ENCOUNTER — Telehealth: Payer: Self-pay | Admitting: Nurse Practitioner

## 2015-04-03 NOTE — Telephone Encounter (Signed)
Pt is calling to see what the results of her bone density  Was concerned as to why she has not heard anything yet

## 2015-04-05 NOTE — Telephone Encounter (Signed)
See result note.  

## 2015-04-10 ENCOUNTER — Other Ambulatory Visit: Payer: Self-pay | Admitting: Family Medicine

## 2015-04-26 ENCOUNTER — Ambulatory Visit (INDEPENDENT_AMBULATORY_CARE_PROVIDER_SITE_OTHER): Payer: Medicare HMO | Admitting: Family Medicine

## 2015-04-26 ENCOUNTER — Encounter: Payer: Self-pay | Admitting: Family Medicine

## 2015-04-26 ENCOUNTER — Telehealth: Payer: Self-pay | Admitting: *Deleted

## 2015-04-26 VITALS — BP 130/76 | Temp 98.5°F | Ht 63.0 in | Wt 133.4 lb

## 2015-04-26 DIAGNOSIS — Z79899 Other long term (current) drug therapy: Secondary | ICD-10-CM

## 2015-04-26 DIAGNOSIS — J329 Chronic sinusitis, unspecified: Secondary | ICD-10-CM | POA: Diagnosis not present

## 2015-04-26 DIAGNOSIS — N390 Urinary tract infection, site not specified: Secondary | ICD-10-CM | POA: Diagnosis not present

## 2015-04-26 DIAGNOSIS — R5383 Other fatigue: Secondary | ICD-10-CM

## 2015-04-26 DIAGNOSIS — J310 Chronic rhinitis: Secondary | ICD-10-CM

## 2015-04-26 DIAGNOSIS — R300 Dysuria: Secondary | ICD-10-CM | POA: Diagnosis not present

## 2015-04-26 LAB — POCT URINALYSIS DIPSTICK
Blood, UA: 250
Spec Grav, UA: <=1.005
pH, UA: 5

## 2015-04-26 MED ORDER — AMOXICILLIN 500 MG PO CAPS: 500.0000 mg | ORAL_CAPSULE | Freq: Three times a day (TID) | ORAL | Status: DC

## 2015-04-26 NOTE — Progress Notes (Signed)
   Subjective:    Patient ID: Grace [name], female    DOB: Apr 30, 1945, 70 y.o.   MRN: [id number]  Sinusitis This is a new problem. The current episode started in the past 7 days. Associated symptoms include congestion, coughing, headaches, a hoarse voice and sinus pressure. Past treatments include acetaminophen (Robitussin, Tylenol). The treatment provided mild relief.  Urinary Tract Infection  This is a new problem. The current episode started today. The problem occurs every urination. The quality of the pain is described as burning. There has been no fever. Associated symptoms include frequency. Associated symptoms comments: Painful urination, low back pain. She has tried acetaminophen (Tylenol) for the symptoms.     Patient states no other concerns this visit.  Review of Systems  HENT: Positive for congestion, hoarse voice and sinus pressure.   Respiratory: Positive for cough.   Genitourinary: Positive for frequency.  Neurological: Positive for headaches.       Objective:   Physical Exam   alert vitals stable no acute distress HEENT moderate nasal congestion lungs clear heart regular in rhythmor 6   urinalysis 8-10 white blood cells per high-power field      Assessment & Plan:   impression 1 urinary tract infection #2 rhinosinusitis plan antibiotics prescribed. Symptom care discussed local measures discussed WSL

## 2015-04-26 NOTE — Telephone Encounter (Signed)
Pt wants to add test to her bw to check her b12 and calcium. Pt also states she has been feeling tired and any other test you think she needs. Pt has orders already for lipid, liver, tsh. Pt wants to do bw on Monday.

## 2015-04-27 NOTE — Telephone Encounter (Signed)
LMRC

## 2015-04-27 NOTE — Telephone Encounter (Signed)
Metabolic 7 because of high risk medication this would cover calcium potassium sugar. As for the B12 it is fine to order it under her the diagnosis of fatigue but please let the patient know that Medicare sometimes does not pay for B12 and therefore that is something outside of our control

## 2015-04-27 NOTE — Telephone Encounter (Signed)
Discussed with pt. Orders ready.

## 2015-04-30 ENCOUNTER — Encounter: Payer: Self-pay | Admitting: Family Medicine

## 2015-04-30 ENCOUNTER — Ambulatory Visit (INDEPENDENT_AMBULATORY_CARE_PROVIDER_SITE_OTHER): Payer: Medicare HMO | Admitting: Family Medicine

## 2015-04-30 VITALS — BP 132/80 | Temp 99.8°F | Wt 135.0 lb

## 2015-04-30 DIAGNOSIS — N3000 Acute cystitis without hematuria: Secondary | ICD-10-CM | POA: Diagnosis not present

## 2015-04-30 DIAGNOSIS — R300 Dysuria: Secondary | ICD-10-CM

## 2015-04-30 LAB — POCT URINALYSIS DIPSTICK
Spec Grav, UA: 1.01
pH, UA: 6

## 2015-04-30 MED ORDER — CEFTRIAXONE SODIUM 1 G IJ SOLR
500.0000 mg | Freq: Once | INTRAMUSCULAR | Status: AC
  Administered 2015-04-30: 500 mg via INTRAMUSCULAR

## 2015-04-30 MED ORDER — ONDANSETRON HCL 8 MG PO TABS: 8.0000 mg | ORAL_TABLET | Freq: Three times a day (TID) | ORAL | Status: DC | PRN

## 2015-04-30 MED ORDER — CIPROFLOXACIN HCL 500 MG PO TABS: 500.0000 mg | ORAL_TABLET | Freq: Two times a day (BID) | ORAL | Status: DC

## 2015-04-30 MED ORDER — PHENAZOPYRIDINE HCL 100 MG PO TABS: 100.0000 mg | ORAL_TABLET | Freq: Three times a day (TID) | ORAL | Status: DC | PRN

## 2015-04-30 NOTE — Progress Notes (Signed)
   Subjective:    Patient ID: Grace [name], female    DOB: 1945-04-19, 70 y.o.   MRN: [id number]  Urinary Tract Infection  This is a new problem. Episode onset: seen last thursday and prescribed amoxil. Associated symptoms include nausea. Treatments tried: amoxil.   No wheezing or difficulty breathing no vomiting just some nausea and low-grade fevers some chills no sweats PMH recent UTI   Review of Systems  Gastrointestinal: Positive for nausea.   dysuria urinary frequency some intermittent abdominal pain denies flank pain     Objective:   Physical Exam  Lungs clear hearts regular abdomen is soft subjected discomfort midabdomen no flank pain no guarding or rebound.    Assessment & Plan:  UTI possible progression shot of Rocephin change antibiotics to Cipro recheck patient in 48 hours if not improving culture urine. Hold off on any lab work.

## 2015-05-01 DIAGNOSIS — E038 Other specified hypothyroidism: Secondary | ICD-10-CM | POA: Diagnosis not present

## 2015-05-01 DIAGNOSIS — E7800 Pure hypercholesterolemia, unspecified: Secondary | ICD-10-CM | POA: Diagnosis not present

## 2015-05-02 LAB — LIPID PANEL
Chol/HDL Ratio: 4.7 ratio units — ABNORMAL HIGH (ref 0.0–4.4)
Cholesterol, Total: 147 mg/dL (ref 100–199)
HDL: 31 mg/dL — ABNORMAL LOW (ref >39)
LDL Calculated: 98 mg/dL (ref 0–99)
Triglycerides: 92 mg/dL (ref 0–149)
VLDL Cholesterol Cal: 18 mg/dL (ref 5–40)

## 2015-05-02 LAB — BASIC METABOLIC PANEL
BUN/Creatinine Ratio: 9 — ABNORMAL LOW (ref 11–26)
BUN: 9 mg/dL (ref 8–27)
CO2: 24 mmol/L (ref 18–29)
Calcium: 8.8 mg/dL (ref 8.7–10.3)
Chloride: 98 mmol/L (ref 96–106)
Creatinine, Ser: 1 mg/dL (ref 0.57–1.00)
GFR calc Af Amer: 67 mL/min/{1.73_m2} (ref >59)
GFR calc non Af Amer: 58 mL/min/{1.73_m2} — ABNORMAL LOW (ref >59)
Glucose: 100 mg/dL — ABNORMAL HIGH (ref 65–99)
Potassium: 4.1 mmol/L (ref 3.5–5.2)
Sodium: 137 mmol/L (ref 134–144)

## 2015-05-02 LAB — VITAMIN B12: Vitamin B-12: 1135 pg/mL — ABNORMAL HIGH (ref 211–946)

## 2015-05-02 LAB — HEPATIC FUNCTION PANEL
ALT: 30 IU/L (ref 0–32)
AST: 38 IU/L (ref 0–40)
Albumin: 3.9 g/dL (ref 3.6–4.8)
Alkaline Phosphatase: 63 IU/L (ref 39–117)
Bilirubin Total: 0.5 mg/dL (ref 0.0–1.2)
Bilirubin, Direct: 0.13 mg/dL (ref 0.00–0.40)
Total Protein: 6.8 g/dL (ref 6.0–8.5)

## 2015-05-02 LAB — TSH: TSH: 1.38 u[IU]/mL (ref 0.450–4.500)

## 2015-05-02 LAB — URINE CULTURE: Organism ID, Bacteria: NO GROWTH

## 2015-05-04 ENCOUNTER — Telehealth: Payer: Self-pay | Admitting: Family Medicine

## 2015-05-04 ENCOUNTER — Other Ambulatory Visit: Payer: Self-pay | Admitting: Nurse Practitioner

## 2015-05-04 MED ORDER — FLUCONAZOLE 150 MG PO TABS: ORAL_TABLET | ORAL | Status: DC

## 2015-05-04 NOTE — Telephone Encounter (Signed)
Her last 2 urine cultures were negative AND the antibiotic did not seem to help. Is she having any vaginal discharge, irritation or rash in the private area?

## 2015-05-04 NOTE — Telephone Encounter (Signed)
LMRC

## 2015-05-04 NOTE — Telephone Encounter (Signed)
Please see office visit and urine culture result from 04/30/15

## 2015-05-04 NOTE — Telephone Encounter (Signed)
Patient states that she is having vaginal burning and slight discharge. Patient states that if you want her to come in today to check her symptoms then she will. She is very frustrated and wants to feel better real soon and wants something done today.

## 2015-05-04 NOTE — Telephone Encounter (Signed)
Message for Grace [name]) patient states needing something called in ASAP still having bad burning when she urinates.Cant take the pain when she goes was seen 1/30 and given antibiotic but didn't help still having burning when she urinates .She thinks something else is going on normally her UTI clears up with antibiotic.Call in something to [facility]

## 2015-05-07 ENCOUNTER — Other Ambulatory Visit: Payer: Self-pay | Admitting: Nurse Practitioner

## 2015-05-07 ENCOUNTER — Encounter: Payer: Self-pay | Admitting: Nurse Practitioner

## 2015-05-07 ENCOUNTER — Ambulatory Visit (INDEPENDENT_AMBULATORY_CARE_PROVIDER_SITE_OTHER): Payer: Medicare HMO | Admitting: Nurse Practitioner

## 2015-05-07 VITALS — BP 148/80 | Temp 98.2°F | Wt 131.0 lb

## 2015-05-07 DIAGNOSIS — N949 Unspecified condition associated with female genital organs and menstrual cycle: Secondary | ICD-10-CM

## 2015-05-07 DIAGNOSIS — N9089 Other specified noninflammatory disorders of vulva and perineum: Secondary | ICD-10-CM | POA: Diagnosis not present

## 2015-05-07 MED ORDER — KETOCONAZOLE 2 % EX CREA
1.0000 | TOPICAL_CREAM | Freq: Two times a day (BID) | CUTANEOUS | Status: DC
Start: 2015-05-07 — End: 2015-09-06

## 2015-05-08 ENCOUNTER — Ambulatory Visit: Payer: Medicare HMO | Admitting: Family Medicine

## 2015-05-08 LAB — SPECIMEN STATUS REPORT

## 2015-05-09 ENCOUNTER — Encounter: Payer: Self-pay | Admitting: Nurse Practitioner

## 2015-05-09 NOTE — Progress Notes (Signed)
Subjective:  Presents for complaints of extreme external burning in her private area for the past 2 weeks. Minimal discharge with slight yellowish color, minimal odor. No itching. No fever. No pelvic pain. Same sexual partner. Her only urinary symptom comes from external burning.  Objective:   BP 148/80 mmHg  Temp(Src) 98.2 F (36.8 C) (Oral)  Wt 131 lb (59.421 kg) NAD. Alert, oriented. External GU along both inner labia is moderately erythematous superficial erosions with what appears to be healing lesions. Area is very tender to palpation. Minimal edema. Vagina no discharge, skin is pale and minimally injected. Bimanual exam normal, no tenderness noted in the pelvic area. Reviewed recent urine culture which was negative.  Assessment: Labial burning - Plan: HSV(herpes simplex vrs) 1+2 ab-IgG  Labial erosion - Plan: HSV(herpes simplex vrs) 1+2 ab-IgG  Plan:  Meds ordered this encounter  Medications  . ketoconazole (NIZORAL) 2 % cream    Sig: Apply 1 application topically 2 (two) times daily. prn    Dispense:  30 g    Refill:  0    Order Specific Question:  Supervising Provider    Answer:  Mikey Kirschner [[id number]]   Trial of ketoconazole. As a precaution HSV test obtained. Call back if worsens or persists. No further antibiotics are indicated at this point.

## 2015-05-10 ENCOUNTER — Telehealth: Payer: Self-pay | Admitting: Family Medicine

## 2015-05-10 NOTE — Telephone Encounter (Signed)
Pt is requesting that Hoyle Sauer give her a call regarding her previous appt.

## 2015-05-11 NOTE — Telephone Encounter (Signed)
Pt called and would like for Hoyle Sauer to call her today.

## 2015-05-12 LAB — HERPES SIMPLEX VIRUS CULTURE

## 2015-05-12 LAB — HSV(HERPES SIMPLEX VRS) I + II AB-IGG

## 2015-05-14 ENCOUNTER — Other Ambulatory Visit: Payer: Self-pay | Admitting: Nurse Practitioner

## 2015-05-14 DIAGNOSIS — A6000 Herpesviral infection of urogenital system, unspecified: Secondary | ICD-10-CM

## 2015-05-14 MED ORDER — VALACYCLOVIR HCL 1 G PO TABS: 1000.0000 mg | ORAL_TABLET | Freq: Every day | ORAL | Status: DC

## 2015-05-14 NOTE — Telephone Encounter (Signed)
See result note. Spoke with patient this am.

## 2015-06-05 ENCOUNTER — Encounter: Payer: Self-pay | Admitting: Family Medicine

## 2015-06-05 ENCOUNTER — Ambulatory Visit (INDEPENDENT_AMBULATORY_CARE_PROVIDER_SITE_OTHER): Payer: Medicare HMO | Admitting: Family Medicine

## 2015-06-05 VITALS — Temp 98.6°F | Ht 63.0 in | Wt 129.5 lb

## 2015-06-05 DIAGNOSIS — J309 Allergic rhinitis, unspecified: Secondary | ICD-10-CM | POA: Diagnosis not present

## 2015-06-05 DIAGNOSIS — J019 Acute sinusitis, unspecified: Secondary | ICD-10-CM | POA: Diagnosis not present

## 2015-06-05 DIAGNOSIS — R5383 Other fatigue: Secondary | ICD-10-CM

## 2015-06-05 DIAGNOSIS — B9689 Other specified bacterial agents as the cause of diseases classified elsewhere: Secondary | ICD-10-CM

## 2015-06-05 DIAGNOSIS — D509 Iron deficiency anemia, unspecified: Secondary | ICD-10-CM

## 2015-06-05 MED ORDER — AMOXICILLIN-POT CLAVULANATE 875-125 MG PO TABS: 1.0000 | ORAL_TABLET | Freq: Two times a day (BID) | ORAL | Status: DC

## 2015-06-05 NOTE — Patient Instructions (Signed)
Take the antibiotic twice a day with a snack.  Over-the-counter Claritin/loratadine 10 mg 1 daily for the next few weeks  Please do your lab work in the coming days  Follow-up if ongoing troubles

## 2015-06-05 NOTE — Progress Notes (Signed)
   Subjective:    Patient ID: Grace [name], female    DOB: [birth year], 70 y.o.   MRN: [id number]  Sinusitis This is a new problem. The current episode started 1 to 4 weeks ago. The problem is unchanged. There has been no fever. Associated symptoms include congestion and coughing. Past treatments include oral decongestants. The treatment provided no relief.   Has had 2 weeks of head congestion drainage coughing not feeling good more sinus pressure the past few days denies wheezing or difficulty breathing. PMH benign does relate some intermittent fatigue states she gets proper sleep proper rest.   Review of Systems  HENT: Positive for congestion.   Respiratory: Positive for cough.    no wheezing or difficulty breathing no shortness of breath no high fever chills sweats.     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

## 2015-06-06 LAB — CBC WITH DIFFERENTIAL/PLATELET
Basophils Absolute: 0.1 10*3/uL (ref 0.0–0.2)
Basos: 1 %
EOS (ABSOLUTE): 0.1 10*3/uL (ref 0.0–0.4)
Eos: 2 %
Hematocrit: 38.1 % (ref 34.0–46.6)
Hemoglobin: 12.9 g/dL (ref 11.1–15.9)
Immature Grans (Abs): 0 10*3/uL (ref 0.0–0.1)
Immature Granulocytes: 1 %
Lymphocytes Absolute: 1.8 10*3/uL (ref 0.7–3.1)
Lymphs: 28 %
MCH: 30.4 pg (ref 26.6–33.0)
MCHC: 33.9 g/dL (ref 31.5–35.7)
MCV: 90 fL (ref 79–97)
Monocytes Absolute: 0.7 10*3/uL (ref 0.1–0.9)
Monocytes: 10 %
Neutrophils Absolute: 3.7 10*3/uL (ref 1.4–7.0)
Neutrophils: 58 %
Platelets: 335 10*3/uL (ref 150–379)
RBC: 4.25 x10E6/uL (ref 3.77–5.28)
RDW: 13.1 % (ref 12.3–15.4)
WBC: 6.4 10*3/uL (ref 3.4–10.8)

## 2015-06-06 LAB — FERRITIN: Ferritin: 155 ng/mL — ABNORMAL HIGH (ref 15–150)

## 2015-07-03 ENCOUNTER — Other Ambulatory Visit: Payer: Self-pay | Admitting: *Deleted

## 2015-07-03 ENCOUNTER — Telehealth: Payer: Self-pay | Admitting: Family Medicine

## 2015-07-03 MED ORDER — FENOFIBRIC ACID 105 MG PO TABS: ORAL_TABLET | ORAL | Status: DC

## 2015-07-03 MED ORDER — LEVOTHYROXINE SODIUM 75 MCG PO TABS: ORAL_TABLET | ORAL | Status: DC

## 2015-07-03 NOTE — Telephone Encounter (Signed)
meds sent to mail order. Pt notified.

## 2015-07-03 NOTE — Telephone Encounter (Signed)
Patient wants her prescriptions through mail order now.She states it would be cheater for her. Her new pharmacy will fill her prescriptions for 90 day supply  and its called [facility] mail order fax: [phone] . Refill on levothyroxine 75 mg,fenofibric acid 105 mg. Her member no: [id number]

## 2015-07-27 ENCOUNTER — Encounter: Payer: Self-pay | Admitting: Family Medicine

## 2015-07-27 ENCOUNTER — Ambulatory Visit (INDEPENDENT_AMBULATORY_CARE_PROVIDER_SITE_OTHER): Payer: Medicare HMO | Admitting: Family Medicine

## 2015-07-27 VITALS — BP 130/68 | Temp 98.1°F | Ht 63.0 in | Wt 130.0 lb

## 2015-07-27 DIAGNOSIS — J019 Acute sinusitis, unspecified: Secondary | ICD-10-CM | POA: Diagnosis not present

## 2015-07-27 DIAGNOSIS — J209 Acute bronchitis, unspecified: Secondary | ICD-10-CM

## 2015-07-27 DIAGNOSIS — E7800 Pure hypercholesterolemia, unspecified: Secondary | ICD-10-CM

## 2015-07-27 MED ORDER — AZITHROMYCIN 250 MG PO TABS: ORAL_TABLET | ORAL | Status: DC

## 2015-07-27 MED ORDER — HYDROCODONE-HOMATROPINE 5-1.5 MG/5ML PO SYRP: 5.0000 mL | ORAL_SOLUTION | Freq: Four times a day (QID) | ORAL | Status: DC | PRN

## 2015-07-27 MED ORDER — ALBUTEROL SULFATE HFA 108 (90 BASE) MCG/ACT IN AERS: 2.0000 | INHALATION_SPRAY | RESPIRATORY_TRACT | Status: DC | PRN

## 2015-07-27 NOTE — Progress Notes (Signed)
   Subjective:    Patient ID: Grace [name], female    DOB: [birth year], 69 y.o.   MRN: [id number]  Cough This is a new problem. The current episode started yesterday. Associated symptoms include nasal congestion, rhinorrhea, a sore throat and wheezing. Pertinent negatives include no chest pain, ear pain, fever or shortness of breath. Associated symptoms comments: diarrhea.   Patient with head congestion along better postnasal drainage sinus pressure in addition to sore throat and coughing and wheezing and chest congestion as well PMH benign  Review of Systems  Constitutional: Negative for fever and activity change.  HENT: Positive for congestion, rhinorrhea and sore throat. Negative for ear pain.   Eyes: Negative for discharge.  Respiratory: Positive for cough and wheezing. Negative for shortness of breath.   Cardiovascular: Negative for chest pain.   Patient is due for cholesterol profile. She is to do this by June    Objective:   Physical Exam  Constitutional: She appears well-developed.  HENT:  Head: Normocephalic.  Nose: Nose normal.  Mouth/Throat: Oropharynx is clear and moist. No oropharyngeal exudate.  Neck: Neck supple.  Cardiovascular: Normal rate and normal heart sounds.   No murmur heard. Pulmonary/Chest: Effort normal and breath sounds normal. She has no wheezes.  Lymphadenopathy:    She has no cervical adenopathy.  Skin: Skin is warm and dry.  Nursing note and vitals reviewed.         Assessment & Plan:  Significant bronchitis with wheezing and congestion I do not feel the patient needs prednisone if worse get azithromycin filled go ahead and use Proventil 2 puffs every 4 hours when necessary plus also warning signs discuss and also may use prescription cough medicines follow-up if ongoing troubles or if not better

## 2015-08-02 ENCOUNTER — Telehealth: Payer: Self-pay | Admitting: Family Medicine

## 2015-08-02 MED ORDER — CEFPROZIL 500 MG PO TABS: ORAL_TABLET | ORAL | Status: DC

## 2015-08-02 NOTE — Telephone Encounter (Signed)
Spoke with patient and informed her per Dr.Scott Luking- May use Cefzil 500 mg twice a day for 7 days, also recommend over-the-counter allergy medication if not using already Claritin 1 daily or store brand of it. Patient verbalized understanding.

## 2015-08-02 NOTE — Telephone Encounter (Signed)
Spoke with patient and patient stated that she has been experiencing light headiness when standing and pressure over temples, nasal congestion, with a static sound in ears.  Seen on 07/27/15 prescribed Zpak. Has one tablet left to take on Zpak. Please advise?

## 2015-08-02 NOTE — Telephone Encounter (Signed)
Pt was seen recently and states that she has been really light headed and is having a lot of static in her ears. Please advise.

## 2015-08-02 NOTE — Telephone Encounter (Signed)
May use Cefzil 500 mg twice a day for 7 days, also recommend over-the-counter allergy medication if not using already Claritin 1 daily or store brand of it.

## 2015-08-30 DIAGNOSIS — J209 Acute bronchitis, unspecified: Secondary | ICD-10-CM | POA: Diagnosis not present

## 2015-08-30 DIAGNOSIS — E7800 Pure hypercholesterolemia, unspecified: Secondary | ICD-10-CM | POA: Diagnosis not present

## 2015-08-31 LAB — LIPID PANEL
Chol/HDL Ratio: 3.7 ratio units (ref 0.0–4.4)
Cholesterol, Total: 165 mg/dL (ref 100–199)
HDL: 45 mg/dL (ref >39)
LDL Calculated: 100 mg/dL — ABNORMAL HIGH (ref 0–99)
Triglycerides: 99 mg/dL (ref 0–149)
VLDL Cholesterol Cal: 20 mg/dL (ref 5–40)

## 2015-08-31 LAB — HEPATIC FUNCTION PANEL
ALT: 35 IU/L — ABNORMAL HIGH (ref 0–32)
AST: 41 IU/L — ABNORMAL HIGH (ref 0–40)
Albumin: 4.3 g/dL (ref 3.6–4.8)
Alkaline Phosphatase: 49 IU/L (ref 39–117)
Bilirubin Total: 0.4 mg/dL (ref 0.0–1.2)
Bilirubin, Direct: 0.11 mg/dL (ref 0.00–0.40)
Total Protein: 6.9 g/dL (ref 6.0–8.5)

## 2015-09-06 ENCOUNTER — Encounter: Payer: Self-pay | Admitting: Family Medicine

## 2015-09-06 ENCOUNTER — Ambulatory Visit (INDEPENDENT_AMBULATORY_CARE_PROVIDER_SITE_OTHER): Payer: Medicare HMO | Admitting: Family Medicine

## 2015-09-06 VITALS — BP 124/60 | Ht 63.0 in | Wt 131.5 lb

## 2015-09-06 DIAGNOSIS — E785 Hyperlipidemia, unspecified: Secondary | ICD-10-CM | POA: Diagnosis not present

## 2015-09-06 DIAGNOSIS — E039 Hypothyroidism, unspecified: Secondary | ICD-10-CM | POA: Diagnosis not present

## 2015-09-06 NOTE — Progress Notes (Signed)
   Subjective:    Patient ID: Grace [name], female    DOB: 06/11/1945, 70 y.o.   MRN: [id number]  HPI Patient at times feels fatigued but for the most part feels good eating well staying active relates she is gradually getting over the upper respiratory bronchitis issues that she was having. She is taken her medicine as directed. She tries to eat healthy and stay physically active. PMH thyroid cholesterol Lab work reviewed with patient today   Review of Systems  Constitutional: Negative for activity change, appetite change and fatigue.  HENT: Negative for congestion.   Respiratory: Negative for cough.   Cardiovascular: Negative for chest pain.  Gastrointestinal: Negative for abdominal pain.  Endocrine: Negative for polydipsia and polyphagia.  Neurological: Negative for weakness.  Psychiatric/Behavioral: Negative for confusion.       Objective:   Physical Exam  Constitutional: She appears well-nourished. No distress.  Cardiovascular: Normal rate, regular rhythm and normal heart sounds.   No murmur heard. Pulmonary/Chest: Effort normal and breath sounds normal. No respiratory distress.  Musculoskeletal: She exhibits no edema.  Lymphadenopathy:    She has no cervical adenopathy.  Neurological: She is alert. She exhibits normal muscle tone.  Psychiatric: Her behavior is normal.  Vitals reviewed.  No tenderness on the rib exam on the left side no rash noted       Assessment & Plan:  Rib tenderness if it reappears let us know this seems to resolve  Recent upper respiratory illness and bronchitis gradually resolving no need for x-rays if hemoptysis increased coughing or worse follow-up  Hypothyroidism check thyroid function continue current medication  Hyperlipidemia repeat liver profile in approximately 4-6 weeks slight elevation of liver enzyme cholesterol looks good

## 2015-10-09 DIAGNOSIS — E785 Hyperlipidemia, unspecified: Secondary | ICD-10-CM | POA: Diagnosis not present

## 2015-10-09 DIAGNOSIS — E039 Hypothyroidism, unspecified: Secondary | ICD-10-CM | POA: Diagnosis not present

## 2015-10-10 LAB — BASIC METABOLIC PANEL
BUN/Creatinine Ratio: 14 (ref 12–28)
BUN: 13 mg/dL (ref 8–27)
CO2: 26 mmol/L (ref 18–29)
Calcium: 9.2 mg/dL (ref 8.7–10.3)
Chloride: 102 mmol/L (ref 96–106)
Creatinine, Ser: 0.92 mg/dL (ref 0.57–1.00)
GFR calc Af Amer: 74 mL/min/{1.73_m2} (ref >59)
GFR calc non Af Amer: 64 mL/min/{1.73_m2} (ref >59)
Glucose: 94 mg/dL (ref 65–99)
Potassium: 4.2 mmol/L (ref 3.5–5.2)
Sodium: 142 mmol/L (ref 134–144)

## 2015-10-10 LAB — HEPATIC FUNCTION PANEL
ALT: 31 IU/L (ref 0–32)
AST: 38 IU/L (ref 0–40)
Albumin: 4.1 g/dL (ref 3.6–4.8)
Alkaline Phosphatase: 49 IU/L (ref 39–117)
Bilirubin Total: 0.4 mg/dL (ref 0.0–1.2)
Bilirubin, Direct: 0.13 mg/dL (ref 0.00–0.40)
Total Protein: 6.8 g/dL (ref 6.0–8.5)

## 2015-10-10 LAB — TSH: TSH: 2.6 u[IU]/mL (ref 0.450–4.500)

## 2015-11-01 DIAGNOSIS — Z1231 Encounter for screening mammogram for malignant neoplasm of breast: Secondary | ICD-10-CM | POA: Diagnosis not present

## 2015-11-12 ENCOUNTER — Encounter: Payer: Self-pay | Admitting: Family Medicine

## 2015-11-22 DIAGNOSIS — Z0000 Encounter for general adult medical examination without abnormal findings: Secondary | ICD-10-CM | POA: Diagnosis not present

## 2015-11-22 DIAGNOSIS — E039 Hypothyroidism, unspecified: Secondary | ICD-10-CM | POA: Diagnosis not present

## 2015-11-22 DIAGNOSIS — E785 Hyperlipidemia, unspecified: Secondary | ICD-10-CM | POA: Diagnosis not present

## 2015-11-26 ENCOUNTER — Other Ambulatory Visit (INDEPENDENT_AMBULATORY_CARE_PROVIDER_SITE_OTHER): Payer: Self-pay | Admitting: *Deleted

## 2015-11-26 DIAGNOSIS — Z86010 Personal history of colonic polyps: Secondary | ICD-10-CM | POA: Insufficient documentation

## 2015-12-05 ENCOUNTER — Telehealth: Payer: Self-pay | Admitting: Family Medicine

## 2015-12-05 DIAGNOSIS — E890 Postprocedural hypothyroidism: Secondary | ICD-10-CM | POA: Diagnosis not present

## 2015-12-05 NOTE — Telephone Encounter (Signed)
Grace [name] needs order for Reclast signed and faxed over today, as she has an appointment tomorrow.

## 2015-12-05 NOTE — Telephone Encounter (Signed)
This form was filled out and staff was faxing it

## 2015-12-06 ENCOUNTER — Encounter (HOSPITAL_COMMUNITY)
Admission: RE | Admit: 2015-12-06 | Discharge: 2015-12-06 | Disposition: A | Discharge status: Home / Self Care | Payer: Medicare HMO | Source: Ambulatory Visit | Attending: Family Medicine | Admitting: Family Medicine

## 2015-12-06 DIAGNOSIS — M810 Age-related osteoporosis without current pathological fracture: Secondary | ICD-10-CM | POA: Insufficient documentation

## 2015-12-06 LAB — COMPREHENSIVE METABOLIC PANEL
ALT: 29 U/L (ref 14–54)
AST: 40 U/L (ref 15–41)
Albumin: 4.3 g/dL (ref 3.5–5.0)
Alkaline Phosphatase: 48 U/L (ref 38–126)
Anion gap: 7 (ref 5–15)
BUN: 16 mg/dL (ref 6–20)
CO2: 27 mmol/L (ref 22–32)
Calcium: 9.6 mg/dL (ref 8.9–10.3)
Chloride: 104 mmol/L (ref 101–111)
Creatinine, Ser: 0.76 mg/dL (ref 0.44–1.00)
GFR calc Af Amer: >60 mL/min (ref >60)
GFR calc non Af Amer: >60 mL/min (ref >60)
Glucose, Bld: 105 mg/dL — ABNORMAL HIGH (ref 65–99)
Potassium: 4 mmol/L (ref 3.5–5.1)
Sodium: 138 mmol/L (ref 135–145)
Total Bilirubin: 0.5 mg/dL (ref 0.3–1.2)
Total Protein: 7.2 g/dL (ref 6.5–8.1)

## 2015-12-06 MED ORDER — ZOLEDRONIC ACID 5 MG/100ML IV SOLN
INTRAVENOUS | Status: AC
  Filled 2015-12-06: qty 100

## 2015-12-06 MED ORDER — ZOLEDRONIC ACID 5 MG/100ML IV SOLN
5.0000 mg | Freq: Once | INTRAVENOUS | Status: AC
  Administered 2015-12-06: 5 mg via INTRAVENOUS

## 2015-12-06 MED ORDER — SODIUM CHLORIDE 0.9 % IV SOLN
INTRAVENOUS | Status: DC
  Administered 2015-12-06: 09:00:00 via INTRAVENOUS

## 2015-12-06 NOTE — Progress Notes (Signed)
Results for Grace [name], Grace [name] (MRN [id number]) as of 12/06/2015 11:09  Ref. Range 12/06/2015 09:07  Sodium Latest Ref Range: 135 - 145 mmol/L 138  Potassium Latest Ref Range: 3.5 - 5.1 mmol/L 4.0  Chloride Latest Ref Range: 101 - 111 mmol/L 104  CO2 Latest Ref Range: 22 - 32 mmol/L 27  BUN Latest Ref Range: 6 - 20 mg/dL 16  Creatinine Latest Ref Range: 0.44 - 1.00 mg/dL 0.76  Calcium Latest Ref Range: 8.9 - 10.3 mg/dL 9.6  EGFR (Non-African Amer.) Latest Ref Range: >60 mL/min >60  EGFR (African American) Latest Ref Range: >60 mL/min >60  Glucose Latest Ref Range: 65 - 99 mg/dL 105 (H)  Anion gap Latest Ref Range: 5 - 15  7  Alkaline Phosphatase Latest Ref Range: 38 - 126 U/L 48  Albumin Latest Ref Range: 3.5 - 5.0 g/dL 4.3  AST Latest Ref Range: 15 - 41 U/L 40  ALT Latest Ref Range: 14 - 54 U/L 29  Total Protein Latest Ref Range: 6.5 - 8.1 g/dL 7.2  Total Bilirubin Latest Ref Range: 0.3 - 1.2 mg/dL 0.5

## 2015-12-10 ENCOUNTER — Other Ambulatory Visit: Payer: Self-pay | Admitting: Family Medicine

## 2015-12-24 DIAGNOSIS — H04123 Dry eye syndrome of bilateral lacrimal glands: Secondary | ICD-10-CM | POA: Diagnosis not present

## 2015-12-24 DIAGNOSIS — H16223 Keratoconjunctivitis sicca, not specified as Sjogren's, bilateral: Secondary | ICD-10-CM | POA: Diagnosis not present

## 2016-01-07 ENCOUNTER — Ambulatory Visit (INDEPENDENT_AMBULATORY_CARE_PROVIDER_SITE_OTHER): Payer: Medicare HMO | Admitting: Family Medicine

## 2016-01-07 ENCOUNTER — Encounter: Payer: Self-pay | Admitting: Family Medicine

## 2016-01-07 VITALS — BP 120/68 | Ht 63.0 in | Wt 132.8 lb

## 2016-01-07 DIAGNOSIS — Z23 Encounter for immunization: Secondary | ICD-10-CM | POA: Diagnosis not present

## 2016-01-07 DIAGNOSIS — R5383 Other fatigue: Secondary | ICD-10-CM

## 2016-01-07 DIAGNOSIS — Z1159 Encounter for screening for other viral diseases: Secondary | ICD-10-CM | POA: Diagnosis not present

## 2016-01-07 DIAGNOSIS — M2550 Pain in unspecified joint: Secondary | ICD-10-CM

## 2016-01-07 DIAGNOSIS — M791 Myalgia, unspecified site: Secondary | ICD-10-CM

## 2016-01-07 NOTE — Progress Notes (Signed)
   Subjective:    Patient ID: Grace [name], female    DOB: 1945-05-25, 70 y.o.   MRN: [id number]  Hyperlipidemia  This is a chronic problem. The current episode started more than 1 year ago. Treatments tried: fenofibric acid. There are no compliance problems.  Risk factors for coronary artery disease include dyslipidemia and post-menopausal.    Patient reports c/o severe fatigue.Patient having significant fatigue tiredness. She states this is all the time. His been going on for at least 6 months. She denies fever sweats chills denies nausea vomiting diarrhea denies abdominal pain chest pains headaches. No change in weight no change in appetite denies being depressed states she sleeps well states she takes her medicine as directed she's tries to eat healthy. She does walking maybe twice a week does not exercise otherwise. Denies dizziness denies passing out  Patient also states that she has intermittent forgetfulness and difficulty concentrating but denies being depressed denies being stressed or anxious. Has not left her stove running or gotten lost. Mini cog given. Pt passed  Patient also relates muscle aches muscle soreness feels like her muscles aren't as big as it used to be also relates a lot of stiffness and soreness in her joints takes over-the-counter medicine with some success  Patient has hypothyroidism does take her medicine on a regular basis Review of Systems See above.    Objective:   Physical Exam Neck no masses patient does not appear to be in any distress lungs are clear no crackles heart is regular abdomen soft pulse normal extremities no edema skin warm dry       Assessment & Plan:  Fatigue-this is a very wide opening condition that is hard to pinpoint I don't find any obvious answers. No depression no sleep apnea thyroid under good control. Patient could do better job of exercising on a more regular basis. Await the results of labs  Arthralgias with myalgias-could  all be related to getting older but we will run some lab work. I don't find anything obvious on physical exam  Hypothyroidism under good control continue current measures  Difficulty focusing concentrating-patient passed her memory test. Her mother did have Alzheimer's.  Patient will fill and fatigue questionnaire and bring this back to [place]

## 2016-01-08 DIAGNOSIS — D1800 Hemangioma unspecified site: Secondary | ICD-10-CM | POA: Diagnosis not present

## 2016-01-08 DIAGNOSIS — Z85828 Personal history of other malignant neoplasm of skin: Secondary | ICD-10-CM | POA: Diagnosis not present

## 2016-01-08 DIAGNOSIS — L814 Other melanin hyperpigmentation: Secondary | ICD-10-CM | POA: Diagnosis not present

## 2016-01-08 DIAGNOSIS — D225 Melanocytic nevi of trunk: Secondary | ICD-10-CM | POA: Diagnosis not present

## 2016-01-08 DIAGNOSIS — L821 Other seborrheic keratosis: Secondary | ICD-10-CM | POA: Diagnosis not present

## 2016-01-08 DIAGNOSIS — Z23 Encounter for immunization: Secondary | ICD-10-CM | POA: Diagnosis not present

## 2016-01-08 DIAGNOSIS — Z86018 Personal history of other benign neoplasm: Secondary | ICD-10-CM | POA: Diagnosis not present

## 2016-01-08 LAB — C-REACTIVE PROTEIN: CRP: 1.2 mg/L (ref 0.0–4.9)

## 2016-01-08 LAB — HEPATITIS C ANTIBODY: Hep C Virus Ab: <0.1 s/co ratio (ref 0.0–0.9)

## 2016-01-08 LAB — RHEUMATOID FACTOR: Rhuematoid fact SerPl-aCnc: <10 IU/mL (ref 0.0–13.9)

## 2016-01-08 LAB — ANA: Anti Nuclear Antibody(ANA): NEGATIVE

## 2016-01-08 LAB — SEDIMENTATION RATE: Sed Rate: 2 mm/hr (ref 0–40)

## 2016-01-08 LAB — CK: Total CK: 118 U/L (ref 24–173)

## 2016-01-23 DIAGNOSIS — H04123 Dry eye syndrome of bilateral lacrimal glands: Secondary | ICD-10-CM | POA: Diagnosis not present

## 2016-01-31 ENCOUNTER — Telehealth (INDEPENDENT_AMBULATORY_CARE_PROVIDER_SITE_OTHER): Payer: Self-pay | Admitting: *Deleted

## 2016-01-31 ENCOUNTER — Encounter (INDEPENDENT_AMBULATORY_CARE_PROVIDER_SITE_OTHER): Payer: Self-pay | Admitting: *Deleted

## 2016-01-31 NOTE — Telephone Encounter (Signed)
Patient needs osmo pill 

## 2016-02-04 MED ORDER — SOD PHOS MONO-SOD PHOS DIBASIC 1.102-0.398 G PO TABS: 1.0000 | ORAL_TABLET | Freq: Once | ORAL | 0 refills | Status: AC

## 2016-02-07 ENCOUNTER — Telehealth (INDEPENDENT_AMBULATORY_CARE_PROVIDER_SITE_OTHER): Payer: Self-pay | Admitting: *Deleted

## 2016-02-07 NOTE — Telephone Encounter (Signed)
agree

## 2016-02-07 NOTE — Telephone Encounter (Signed)
Referring MD/PCP: scott luking   Procedure: tcs  Reason/Indication:  Hx polyps  Has patient had this procedure before?  Yes, 2012  If so, when, by whom and where?    Is there a family history of colon cancer?  no  Who?  What age when diagnosed?    Is patient diabetic?   no      Does patient have prosthetic heart valve or mechanical valve?  no  Do you have a pacemaker?  no  Has patient ever had endocarditis? no  Has patient had joint replacement within last 12 months?  no  Does patient tend to be constipated or take laxatives? no  Does patient have a history of alcohol/drug use?  no  Is patient on Coumadin, Plavix and/or Aspirin? no  Medications: levothyroxine 75 mcg daily, fenofibrate 105 mg daily, womens one a day vit, fish oil  Allergies: see epic  Medication Adjustment:   Procedure date & time: 03/06/16 at 1200

## 2016-02-26 ENCOUNTER — Ambulatory Visit (INDEPENDENT_AMBULATORY_CARE_PROVIDER_SITE_OTHER): Payer: Medicare HMO | Admitting: Family Medicine

## 2016-02-26 ENCOUNTER — Encounter: Payer: Self-pay | Admitting: Family Medicine

## 2016-02-26 VITALS — BP 116/82 | Temp 98.9°F | Ht 63.0 in | Wt 134.2 lb

## 2016-02-26 DIAGNOSIS — J019 Acute sinusitis, unspecified: Secondary | ICD-10-CM | POA: Diagnosis not present

## 2016-02-26 DIAGNOSIS — B349 Viral infection, unspecified: Secondary | ICD-10-CM

## 2016-02-26 DIAGNOSIS — B9689 Other specified bacterial agents as the cause of diseases classified elsewhere: Secondary | ICD-10-CM

## 2016-02-26 MED ORDER — AMOXICILLIN 500 MG PO CAPS: 500.0000 mg | ORAL_CAPSULE | Freq: Three times a day (TID) | ORAL | 0 refills | Status: DC

## 2016-02-26 NOTE — Progress Notes (Signed)
   Subjective:    Patient ID: Grace [name], female    DOB: [birth year], 70 y.o.   MRN: [id number]  Cough  This is a new problem. The current episode started in the past 7 days. Associated symptoms include myalgias, nasal congestion and rhinorrhea. Pertinent negatives include no chest pain, ear pain, fever, shortness of breath or wheezing. Associated symptoms comments: Body aches. Treatments tried: tylenol.  Slight discomfort and also relates that her muscles are not as strong as he used to be she does not do any type of her body exercising.    Review of Systems  Constitutional: Negative for activity change and fever.  HENT: Positive for congestion and rhinorrhea. Negative for ear pain.   Eyes: Negative for discharge.  Respiratory: Positive for cough. Negative for shortness of breath and wheezing.   Cardiovascular: Negative for chest pain.  Musculoskeletal: Positive for myalgias.       Objective:   Physical Exam  Constitutional: She appears well-developed.  HENT:  Head: Normocephalic.  Nose: Nose normal.  Mouth/Throat: Oropharynx is clear and moist. No oropharyngeal exudate.  Neck: Neck supple.  Cardiovascular: Normal rate and normal heart sounds.   No murmur heard. Pulmonary/Chest: Effort normal and breath sounds normal. She has no wheezes.  Lymphadenopathy:    She has no cervical adenopathy.  Skin: Skin is warm and dry.  Nursing note and vitals reviewed.         Assessment & Plan:  Viral URI Secondary rhinosinusitis Antibiotic prescribed warning signs discussed  Shoulder pain and discomfort exercises shown. Pamphlet given. If ongoing trouble notify us we'll set up with orthopedics

## 2016-03-06 ENCOUNTER — Encounter (HOSPITAL_COMMUNITY): Payer: Self-pay | Admitting: *Deleted

## 2016-03-06 ENCOUNTER — Ambulatory Visit (HOSPITAL_COMMUNITY)
Admission: RE | Admit: 2016-03-06 | Discharge: 2016-03-06 | Disposition: A | Discharge status: Home / Self Care | Payer: Medicare HMO | Source: Ambulatory Visit | Attending: Internal Medicine | Admitting: Internal Medicine

## 2016-03-06 ENCOUNTER — Encounter (HOSPITAL_COMMUNITY)
Admission: RE | Disposition: A | Discharge status: Home / Self Care | Payer: Self-pay | Source: Ambulatory Visit | Attending: Internal Medicine

## 2016-03-06 DIAGNOSIS — D125 Benign neoplasm of sigmoid colon: Secondary | ICD-10-CM | POA: Insufficient documentation

## 2016-03-06 DIAGNOSIS — D123 Benign neoplasm of transverse colon: Secondary | ICD-10-CM | POA: Diagnosis not present

## 2016-03-06 DIAGNOSIS — Z85828 Personal history of other malignant neoplasm of skin: Secondary | ICD-10-CM | POA: Insufficient documentation

## 2016-03-06 DIAGNOSIS — E039 Hypothyroidism, unspecified: Secondary | ICD-10-CM | POA: Insufficient documentation

## 2016-03-06 DIAGNOSIS — E785 Hyperlipidemia, unspecified: Secondary | ICD-10-CM | POA: Insufficient documentation

## 2016-03-06 DIAGNOSIS — Z8673 Personal history of transient ischemic attack (TIA), and cerebral infarction without residual deficits: Secondary | ICD-10-CM | POA: Insufficient documentation

## 2016-03-06 DIAGNOSIS — D122 Benign neoplasm of ascending colon: Secondary | ICD-10-CM | POA: Diagnosis not present

## 2016-03-06 DIAGNOSIS — Z79899 Other long term (current) drug therapy: Secondary | ICD-10-CM | POA: Insufficient documentation

## 2016-03-06 DIAGNOSIS — I341 Nonrheumatic mitral (valve) prolapse: Secondary | ICD-10-CM | POA: Insufficient documentation

## 2016-03-06 DIAGNOSIS — Z86010 Personal history of colonic polyps: Secondary | ICD-10-CM | POA: Insufficient documentation

## 2016-03-06 DIAGNOSIS — Z09 Encounter for follow-up examination after completed treatment for conditions other than malignant neoplasm: Secondary | ICD-10-CM | POA: Diagnosis not present

## 2016-03-06 DIAGNOSIS — Z1211 Encounter for screening for malignant neoplasm of colon: Secondary | ICD-10-CM | POA: Insufficient documentation

## 2016-03-06 DIAGNOSIS — D120 Benign neoplasm of cecum: Secondary | ICD-10-CM | POA: Insufficient documentation

## 2016-03-06 HISTORY — PX: COLONOSCOPY: SHX5424

## 2016-03-06 SURGERY — COLONOSCOPY
Anesthesia: Moderate Sedation

## 2016-03-06 MED ORDER — MEPERIDINE HCL 50 MG/ML IJ SOLN
INTRAMUSCULAR | Status: AC
  Filled 2016-03-06: qty 1

## 2016-03-06 MED ORDER — SODIUM CHLORIDE 0.9 % IV SOLN
INTRAVENOUS | Status: DC
  Administered 2016-03-06: 11:00:00 via INTRAVENOUS

## 2016-03-06 MED ORDER — MEPERIDINE HCL 50 MG/ML IJ SOLN
INTRAMUSCULAR | Status: DC | PRN
  Administered 2016-03-06 (×2): 25 mg via INTRAVENOUS

## 2016-03-06 MED ORDER — MIDAZOLAM HCL 5 MG/5ML IJ SOLN
INTRAMUSCULAR | Status: DC | PRN
  Administered 2016-03-06 (×4): 2 mg via INTRAVENOUS

## 2016-03-06 MED ORDER — MIDAZOLAM HCL 5 MG/5ML IJ SOLN
INTRAMUSCULAR | Status: AC
  Filled 2016-03-06: qty 10

## 2016-03-06 NOTE — Discharge Instructions (Signed)
Resume usual medications and diet. °No driving for 24 hours. °Physician will call with biopsy results. ° ° °Colonoscopy, Adult, Care After °This sheet gives you information about how to care for yourself after your procedure. Your doctor may also give you more specific instructions. If you have problems or questions, call your doctor. °Follow these instructions at home: °General instructions °· For the first 24 hours after the procedure: °¨ Do not drive or use machinery. °¨ Do not sign important documents. °¨ Do not drink alcohol. °¨ Do your daily activities more slowly than normal. °¨ Eat foods that are soft and easy to digest. °¨ Rest often. °· Take over-the-counter or prescription medicines only as told by your doctor. °· It is up to you to get the results of your procedure. Ask your doctor, or the department performing the procedure, when your results will be ready. °To help cramping and bloating: °· Try walking around. °· Put heat on your belly (abdomen) as told by your doctor. Use a heat source that your doctor recommends, such as a moist heat pack or a heating pad. °¨ Put a towel between your skin and the heat source. °¨ Leave the heat on for 20-30 minutes. °¨ Remove the heat if your skin turns bright red. This is especially important if you cannot feel pain, heat, or cold. You can get burned. °Eating and drinking °· Drink enough fluid to keep your pee (urine) clear or pale yellow. °· Return to your normal diet as told by your doctor. Avoid heavy or fried foods that are hard to digest. °· Avoid drinking alcohol for as long as told by your doctor. °Contact a doctor if: °· You have blood in your poop (stool) 2-3 days after the procedure. °Get help right away if: °· You have more than a small amount of blood in your poop. °· You see large clumps of tissue (blood clots) in your poop. °· Your belly is swollen. °· You feel sick to your stomach (nauseous). °· You throw up (vomit). °· You have a fever. °· You have  belly pain that gets worse, and medicine does not help your pain. °This information is not intended to replace advice given to you by your health care provider. Make sure you discuss any questions you have with your health care provider. °Document Released: 04/19/2010 Document Revised: 12/10/2015 Document Reviewed: 12/10/2015 °Elsevier Interactive Patient Education © 2017 [employer] ° °Colon Polyps °Introduction °Polyps are tissue growths inside the body. Polyps can grow in many places, including the large intestine (colon). A polyp may be a round bump or a mushroom-shaped growth. You could have one polyp or several. °Most colon polyps are noncancerous (benign). However, some colon polyps can become cancerous over time. °What are the causes? °The exact cause of colon polyps is not known. °What increases the risk? °This condition is more likely to develop in people who: °· Have a family history of colon cancer or colon polyps. °· Are older than 50 or older than 45 if they are African American. °· Have inflammatory bowel disease, such as ulcerative colitis or Crohn disease. °· Are overweight. °· Smoke cigarettes. °· Do not get enough exercise. °· Drink too much alcohol. °· Eat a diet that is: °¨ High in fat and red meat. °¨ Low in fiber. °· Had childhood cancer that was treated with abdominal radiation. °What are the signs or symptoms? °Most polyps do not cause symptoms. If you have symptoms, they may include: °· Blood coming   from your rectum when having a bowel movement. °· Blood in your stool. The stool may look dark red or black. °· A change in bowel habits, such as constipation or diarrhea. °How is this diagnosed? °This condition is diagnosed with a colonoscopy. This is a procedure that uses a lighted, flexible scope to look at the inside of your colon. °How is this treated? °Treatment for this condition involves removing any polyps that are found. Those polyps will then be tested for cancer. If cancer is  found, your health care provider will talk to you about options for colon cancer treatment. °Follow these instructions at home: °Diet °· Eat plenty of fiber, such as fruits, vegetables, and whole grains. °· Eat foods that are high in calcium and vitamin D, such as milk, cheese, yogurt, eggs, liver, fish, and broccoli. °· Limit foods high in fat, red meats, and processed meats, such as hot dogs, sausage, bacon, and lunch meats. °· Maintain a healthy weight, or lose weight if recommended by your health care provider. °General instructions °· Do not smoke cigarettes. °· Do not drink alcohol excessively. °· Keep all follow-up visits as told by your health care provider. This is important. This includes keeping regularly scheduled colonoscopies. Talk to your health care provider about when you need a colonoscopy. °· Exercise every day or as told by your health care provider. °Contact a health care provider if: °· You have new or worsening bleeding during a bowel movement. °· You have new or increased blood in your stool. °· You have a change in bowel habits. °· You unexpectedly lose weight. °This information is not intended to replace advice given to you by your health care provider. Make sure you discuss any questions you have with your health care provider. °Document Released: 12/12/2003 Document Revised: 08/23/2015 Document Reviewed: 02/05/2015 °© 2017 Elsevier ° °

## 2016-03-06 NOTE — H&P (Signed)
Grace [name] is an 70 y.o. female.   Chief Complaint: Patient is here for colonoscopy. HPI: Patient is 70 year old Caucasian female who has history of multiple colonic adenomas and is here for surveillance colonoscopy. She denies abdominal pain change in bowel habits or rectal bleeding. Family history is negative for CRC.  Past Medical History:  Diagnosis Date  . Cancer (HCC)    Skin Cancer, Squamous Cell  . Diastolic dysfunction [id number]   Ejection fraction 75%.  . Dry eyes, bilateral   . Family history of anesthesia complication    Mother was hard to wake up after anesthesia  . Frequency of urination   . Heart murmur   . Hyperlipidemia    Diet controlled  . Hypothyroid   . Migraine 2013  . Mitral valve prolapse   . TIA (transient ischemic attack) 02/19/2012    Past Surgical History:  Procedure Laterality Date  . BALLOON DILATION N/A 07/28/2013   Procedure: BALLOON DILATION;  Surgeon: Rogene Houston, MD;  Location: [facility];  Service: Endoscopy;  Laterality: N/A;  . BREAST BIOPSY     left benign tumor  . CATARACT EXTRACTION W/PHACO Right 07/11/2013   Procedure: CATARACT EXTRACTION PHACO AND INTRAOCULAR LENS PLACEMENT (IOC);  Surgeon: Tonny Branch, MD;  Location: [facility];  Service: Ophthalmology;  Laterality: Right;  CDE 9.99  . CATARACT EXTRACTION W/PHACO Left 08/04/2013   Procedure: CATARACT EXTRACTION PHACO AND INTRAOCULAR LENS PLACEMENT (IOC);  Surgeon: Tonny Branch, MD;  Location: [facility];  Service: Ophthalmology;  Laterality: Left;  CDE:16.26  . CHOLECYSTECTOMY    . COLONOSCOPY  12/18/2010   Procedure: COLONOSCOPY;  Surgeon: Rogene Houston, MD;  Location: [facility];  Service: Endoscopy;  Laterality: N/A;  1:00 pm  . ESOPHAGOGASTRODUODENOSCOPY    . ESOPHAGOGASTRODUODENOSCOPY N/A 07/28/2013   Procedure: ESOPHAGOGASTRODUODENOSCOPY (EGD);  Surgeon: Rogene Houston, MD;  Location: [facility];  Service: Endoscopy;  Laterality: N/A;  125  . MALONEY DILATION N/A 07/28/2013    Procedure: MALONEY DILATION;  Surgeon: Rogene Houston, MD;  Location: [facility];  Service: Endoscopy;  Laterality: N/A;  . SAVORY DILATION N/A 07/28/2013   Procedure: SAVORY DILATION;  Surgeon: Rogene Houston, MD;  Location: [facility];  Service: Endoscopy;  Laterality: N/A;  . THYROIDECTOMY  11/20/2011   Procedure: THYROIDECTOMY;  Surgeon: Izora Gala, MD;  Location: [place];  Service: ENT;  Laterality: Left;  LEFT THYROID LOBECTOMY  . TUBAL LIGATION     39 yrs ago.    Family History  Problem Relation Age of Onset  . Heart disease Mother   . Hypertension Mother   . Stroke Mother   . Stroke Maternal Grandmother   . Colon cancer Neg Hx    Social History:  reports that she has never smoked. She has never used smokeless tobacco. She reports that she does not drink alcohol or use drugs.  Allergies:  Allergies  Allergen Reactions  . Compazine Other (See Comments)    Slurred speech, unsure of other reaction  . Statins Other (See Comments)    myalgias  . Sulfa Drugs Cross Reactors Rash    Medications Prior to Admission  Medication Sig Dispense Refill  . amoxicillin (AMOXIL) 500 MG capsule Take 500 mg by mouth 3 (three) times daily.    . Calcium Carb-Cholecalciferol (CALCIUM 600 + D) 600-200 MG-UNIT TABS Take 1 tablet by mouth daily.    . cholecalciferol (VITAMIN D) 1000 units tablet Take 1,000 Units by mouth daily.    Marland Kitchen  Fenofibric Acid 105 MG TABS TAKE 1 TABLET DAILY 90 tablet 1  . levothyroxine (SYNTHROID, LEVOTHROID) 75 MCG tablet TAKE 1 TABLET TUESDAY TO   SUNDAY AND 1/2 TABLET ON   MONDAY 88 tablet 1  . albuterol (PROVENTIL HFA;VENTOLIN HFA) 108 (90 Base) MCG/ACT inhaler Inhale 2 puffs into the lungs every 4 (four) hours as needed for wheezing. 1 Inhaler 2  . ALPRAZolam (XANAX) 0.25 MG tablet Take 1 tablet (0.25 mg total) by mouth 2 (two) times daily as needed for anxiety. (Patient taking differently: Take 0.125 mg by mouth 2 (two) times daily as needed for anxiety. ) 30  tablet 0    No results found for this or any previous visit (from the past 48 hour(s)). No results found.  ROS  Blood pressure 121/68, pulse 89, temperature 98.1 F (36.7 C), resp. rate 16, height 5\' 3"  (1.6 m), weight 134 lb (60.8 kg), SpO2 100 %. Physical Exam  Constitutional: She appears well-developed and well-nourished.  HENT:  Mouth/Throat: Oropharynx is clear and moist.  Eyes: Conjunctivae are normal. No scleral icterus.  Neck: No thyromegaly present.  Cardiovascular: Normal rate, regular rhythm and normal heart sounds.   No murmur heard. Respiratory: Effort normal and breath sounds normal.  GI: Soft. She exhibits no distension and no mass. There is no tenderness.  Musculoskeletal: She exhibits no edema.  Lymphadenopathy:    She has no cervical adenopathy.  Neurological: She is alert.  Skin: Skin is warm and dry.     Assessment/Plan History of colonic adenomas. Surveillance colonoscopy.  Hildred Laser, MD 03/06/2016, 12:04 PM

## 2016-03-06 NOTE — Op Note (Signed)
[facility] Patient Name: Grace [name] Procedure Date: 03/06/2016 12:08 PM MRN: [id number] Date of Birth: Jan 28, 1946 Attending MD: Hildred Laser , MD CSN: [id number] Age: 70 Admit Type: Outpatient Procedure:                Colonoscopy Indications:              High risk colon cancer surveillance: Personal                            history of colonic polyps Providers:                Hildred Laser, MD, Lurline Del, RN, Purcell Nails. [place],                            [employer] Referring MD:             Elayne Snare. Wolfgang Phoenix, MD Medicines:                Meperidine 50 mg IV, Midazolam 8 mg IV Complications:            No immediate complications. Estimated Blood Loss:     Estimated blood loss was minimal. Procedure:                Pre-Anesthesia Assessment:                           - Prior to the procedure, a History and Physical                            was performed, and patient medications and                            allergies were reviewed. The patient's tolerance of                            previous anesthesia was also reviewed. The risks                            and benefits of the procedure and the sedation                            options and risks were discussed with the patient.                            All questions were answered, and informed consent                            was obtained. Prior Anticoagulants: The patient has                            taken no previous anticoagulant or antiplatelet                            agents. ASA Grade Assessment: II - A patient with  mild systemic disease. After reviewing the risks                            and benefits, the patient was deemed in                            satisfactory condition to undergo the procedure.                           After obtaining informed consent, the colonoscope                            was passed under direct vision. Throughout the   procedure, the patient's blood pressure, pulse, and                            oxygen saturations were monitored continuously. The                            [id number]) scope was introduced through                            the anus and advanced to the the cecum, identified                            by appendiceal orifice and ileocecal valve. The                            colonoscopy was performed without difficulty. The                            patient tolerated the procedure well. The quality                            of the bowel preparation was adequate. The                            ileocecal valve, appendiceal orifice, and rectum                            were photographed. Scope In: [id number] PM Scope Out: 12:41:48 PM Scope Withdrawal Time: 0 hours 13 minutes 57 seconds  Total Procedure Duration: 0 hours 25 minutes 39 seconds  Findings:      The perianal and digital rectal examinations were normal.      Three sessile polyps were found in the transverse colon, ascending colon       and cecum. The polyps were small in size. These were biopsied with a       cold forceps for histology. The pathology specimen was placed into       Bottle Number 1.      A 4 mm polyp was found in the sigmoid colon. The polyp was sessile. The       polyp was removed with a cold snare. Resection and retrieval were       complete. The pathology specimen was placed into  Bottle Number 1.      The retroflexed view of the distal rectum and anal verge was normal and       showed no anal or rectal abnormalities. Impression:               - Three small polyps in the transverse colon, in                            the ascending colon and in the cecum. Biopsied.                           - One 4 mm polyp in the sigmoid colon, removed with                            a cold snare. Resected and retrieved. Moderate Sedation:      Moderate (conscious) sedation was administered by the endoscopy nurse        and supervised by the endoscopist. The following parameters were       monitored: oxygen saturation, heart rate, blood pressure, CO2       capnography and response to care. Total physician intraservice time was       33 minutes. Recommendation:           - Patient has a contact number available for                            emergencies. The signs and symptoms of potential                            delayed complications were discussed with the                            patient. Return to normal activities tomorrow.                            Written discharge instructions were provided to the                            patient.                           - Resume previous diet today.                           - Continue present medications.                           - Await pathology results.                           - Repeat colonoscopy in 5 years for surveillance. Procedure Code(s):        --- Professional ---                           [phone], Colonoscopy, flexible; with removal of  tumor(s), polyp(s), or other lesion(s) by snare                            technique                           45380, 59, Colonoscopy, flexible; with biopsy,                            single or multiple                           99152, Moderate sedation services provided by the                            same physician or other qualified health care                            professional performing the diagnostic or                            therapeutic service that the sedation supports,                            requiring the presence of an independent trained                            observer to assist in the monitoring of the                            patient's level of consciousness and physiological                            status; initial 15 minutes of intraservice time,                            patient age 68 years or older                           [phone],  Moderate sedation services; each additional                            15 minutes intraservice time Diagnosis Code(s):        --- Professional ---                           Z86.010, Personal history of colonic polyps                           D12.3, Benign neoplasm of transverse colon (hepatic                            flexure or splenic flexure)                           D12.2, Benign neoplasm of  ascending colon                           D12.0, Benign neoplasm of cecum                           D12.5, Benign neoplasm of sigmoid colon CPT copyright 2016 [employer]. All rights reserved. The codes documented in this report are preliminary and upon coder review may  be revised to meet current compliance requirements. Hildred Laser, MD Hildred Laser, MD 03/06/2016 12:48:53 PM This report has been signed electronically. Number of Addenda: 0

## 2016-03-10 ENCOUNTER — Telehealth (INDEPENDENT_AMBULATORY_CARE_PROVIDER_SITE_OTHER): Payer: Self-pay | Admitting: Internal Medicine

## 2016-03-10 NOTE — Telephone Encounter (Signed)
Patient was called. Per Dr.Rehman the patient may take Zofran, and he will call her in something if she would like for him to.  Patient states that she has some generic Zofran on hand ,it is the 8 mg. Per Dr.Rehman she may take this as directed. She was ask to call our office on Wednesday with a progress report.

## 2016-03-10 NOTE — Telephone Encounter (Signed)
Patient called, stated that she had a procedure done last week and just doesn't feel good.  Stated that she is not able to eat and nauseated.  [phone]

## 2016-03-12 ENCOUNTER — Other Ambulatory Visit (INDEPENDENT_AMBULATORY_CARE_PROVIDER_SITE_OTHER): Payer: Self-pay | Admitting: *Deleted

## 2016-03-12 ENCOUNTER — Encounter (HOSPITAL_COMMUNITY): Payer: Self-pay | Admitting: Internal Medicine

## 2016-03-12 ENCOUNTER — Telehealth (INDEPENDENT_AMBULATORY_CARE_PROVIDER_SITE_OTHER): Payer: Self-pay | Admitting: Internal Medicine

## 2016-03-12 DIAGNOSIS — R1010 Upper abdominal pain, unspecified: Secondary | ICD-10-CM | POA: Diagnosis not present

## 2016-03-12 DIAGNOSIS — R531 Weakness: Secondary | ICD-10-CM | POA: Diagnosis not present

## 2016-03-12 DIAGNOSIS — R110 Nausea: Secondary | ICD-10-CM | POA: Diagnosis not present

## 2016-03-12 NOTE — Telephone Encounter (Signed)
Patient called, still not feeling good.  Stated she was to call today and report how she was feeling to Dr. Laural Golden.  She stated that she is nauseated and weak.  Stated that she is able to eat some, but feels as if she's going to throw it up.  She did take Zofran on Monday, which did not help and she was not able to sleep at all that night.  Tuesday she felt dizzy.  [phone]

## 2016-03-12 NOTE — Telephone Encounter (Signed)
Per Dr.Rehman patient needs to have CBC,C-Met. She needs to take Zofran on schedule twice a day. Patient states she is having abdominal pain , weakness , nausea., tightness in chest , sob.

## 2016-03-13 LAB — COMPREHENSIVE METABOLIC PANEL
ALT: 25 U/L (ref 6–29)
AST: 36 U/L — ABNORMAL HIGH (ref 10–35)
Albumin: 4.2 g/dL (ref 3.6–5.1)
Alkaline Phosphatase: 34 U/L (ref 33–130)
BUN: 12 mg/dL (ref 7–25)
CO2: 26 mmol/L (ref 20–31)
Calcium: 9.5 mg/dL (ref 8.6–10.4)
Chloride: 105 mmol/L (ref 98–110)
Creat: 0.91 mg/dL (ref 0.50–0.99)
Glucose, Bld: 91 mg/dL (ref 65–99)
Potassium: 4.5 mmol/L (ref 3.5–5.3)
Sodium: 141 mmol/L (ref 135–146)
Total Bilirubin: 0.7 mg/dL (ref 0.2–1.2)
Total Protein: 7 g/dL (ref 6.1–8.1)

## 2016-03-14 ENCOUNTER — Other Ambulatory Visit (INDEPENDENT_AMBULATORY_CARE_PROVIDER_SITE_OTHER): Payer: Self-pay | Admitting: *Deleted

## 2016-03-14 ENCOUNTER — Telehealth (INDEPENDENT_AMBULATORY_CARE_PROVIDER_SITE_OTHER): Payer: Self-pay | Admitting: *Deleted

## 2016-03-14 ENCOUNTER — Other Ambulatory Visit (INDEPENDENT_AMBULATORY_CARE_PROVIDER_SITE_OTHER): Payer: Self-pay | Admitting: Internal Medicine

## 2016-03-14 DIAGNOSIS — R110 Nausea: Secondary | ICD-10-CM

## 2016-03-14 DIAGNOSIS — R1013 Epigastric pain: Principal | ICD-10-CM

## 2016-03-14 DIAGNOSIS — G8929 Other chronic pain: Secondary | ICD-10-CM

## 2016-03-14 DIAGNOSIS — R0789 Other chest pain: Secondary | ICD-10-CM

## 2016-03-14 LAB — CBC WITH DIFFERENTIAL/PLATELET
Basophils Absolute: 45 cells/uL (ref 0–200)
Basophils Relative: 1 %
Eosinophils Absolute: 90 cells/uL (ref 15–500)
Eosinophils Relative: 2 %
HCT: 41.9 % (ref 35.0–45.0)
Hemoglobin: 13.6 g/dL (ref 11.7–15.5)
Lymphocytes Relative: 34 %
Lymphs Abs: 1530 cells/uL (ref 850–3900)
MCH: 30.2 pg (ref 27.0–33.0)
MCHC: 32.5 g/dL (ref 32.0–36.0)
MCV: 92.9 fL (ref 80.0–100.0)
MPV: 10.6 fL (ref 7.5–12.5)
Monocytes Absolute: 360 cells/uL (ref 200–950)
Monocytes Relative: 8 %
Neutro Abs: 2475 cells/uL (ref 1500–7800)
Neutrophils Relative %: 55 %
Platelets: 222 10*3/uL (ref 140–400)
RBC: 4.51 MIL/uL (ref 3.80–5.10)
RDW: 13 % (ref 11.0–15.0)
WBC: 4.5 10*3/uL (ref 3.8–10.8)

## 2016-03-14 MED ORDER — PANTOPRAZOLE SODIUM 40 MG PO TBEC: 40.0000 mg | DELAYED_RELEASE_TABLET | Freq: Every day | ORAL | 5 refills | Status: DC

## 2016-03-14 NOTE — Telephone Encounter (Signed)
Patient was called and given results of CBC , C-Met. Ast was 36. Per Dr.Rehman if the patient was not feeling better she may be seen.  Patient was called , she says that she is not feeling any better. Continues to have nausea. She was advised per Dr.Rehman to take the Zofran BID on a Schedule earlier in the week. This morning she had just taken 2 TBS of Pepto Bismuth for the nausea. She also says that on last Wednesday after eating her breakfast , getting up from the table she became very dizzy. This happened again Wednesday night as she had swatted in the floor looking at paper under a table.Upon standing she experienced that same dizziness. She has also experienced tightness in her chest. Since her Colonoscopy last Thursday , she says that she has only passed to hard balls for a BM.  Dr.Rehman was given this progress report and he advised that the patient should go to the ED, as she may need fluids. Patient was called and made aware.

## 2016-03-14 NOTE — Telephone Encounter (Signed)
Patient will come to the office. We will do Orthostatics , and Dr.Rehman will come over and see her.

## 2016-03-14 NOTE — Progress Notes (Signed)
Presenting complaint;  Upper abdominal pain nausea and anorexia.  Subjective:  Patient is 70 year old Caucasian female underwent colonoscopy last week with removal of 4 small polyps and 2 were tubular adenomas. These polyps were removed with cold snare. Patient called on 03/10/2016 complaining of upper abdominal discomfort poor appetite and nausea. She did not have vomiting fever chills diarrhea or bleeding. She was advised to take ondansetron which she already had. It was decided to do blood work if she did not improve. She had lab studies on 03/12/2016 as below. Lab studies are unremarkable. Shouldn't called this morning that she was not feeling any better and she felt dizzy eating and on standing up from squatting position. She is not sure if she has lost any weight. She states she has had this pain for a few weeks. She did tell me about this pain when I was saw her prior to colonoscopy. She was treated with amoxicillin for sinusitis about 3 weeks ago. She states she has had one bowel movement since her colonoscopy this morning. She passed to fecal balls. She denies melena or bleeding. He has remote history of peptic ulcer disease. She has been on fenofibrate acid for few months and is concerned that she may be having some side effects. He does not take OTC NSAIDs or aspirin. Complains of intermittent solid food dysphagia which she's had over the last few months. She denies frequent heartburn. She had gallbladder removed more than 10 years ago. History significant for pancreatitis in her mother but she does not remember the cause.   Current Medications: Outpatient Encounter Prescriptions as of 03/14/2016  Medication Sig  . albuterol (PROVENTIL HFA;VENTOLIN HFA) 108 (90 Base) MCG/ACT inhaler Inhale 2 puffs into the lungs every 4 (four) hours as needed for wheezing.  Marland Kitchen ALPRAZolam (XANAX) 0.25 MG tablet Take 1 tablet (0.25 mg total) by mouth 2 (two) times daily as needed for anxiety. (Patient  taking differently: Take 0.125 mg by mouth 2 (two) times daily as needed for anxiety. )  . amoxicillin (AMOXIL) 500 MG capsule Take 500 mg by mouth 3 (three) times daily.  . Calcium Carb-Cholecalciferol (CALCIUM 600 + D) 600-200 MG-UNIT TABS Take 1 tablet by mouth daily.  . cholecalciferol (VITAMIN D) 1000 units tablet Take 1,000 Units by mouth daily.  . Fenofibric Acid 105 MG TABS TAKE 1 TABLET DAILY  . levothyroxine (SYNTHROID, LEVOTHROID) 75 MCG tablet TAKE 1 TABLET TUESDAY TO   SUNDAY AND 1/2 TABLET ON   MONDAY  . pantoprazole (PROTONIX) 40 MG tablet Take 1 tablet (40 mg total) by mouth daily before breakfast.   No facility-administered encounter medications on file as of 03/14/2016.      Objective: Weight 129.7 pounds. She is 5 feet 3 and just all. Pulse 63/m,  blood pressure 126/74, temp 97.5 and respirations 18. Supine pulse was 65 and blood pressure 122/76 and standing pulse was 72 and blood pressure 126/74. Conjunctiva is pink. Sclera is nonicteric Oropharyngeal mucosa is normal. No neck masses or thyromegaly noted. Cardiac exam with regular rhythm normal S1 and S2. No murmur or gallop noted. Lungs are clear to auscultation. Abdomen is symmetrical. Bowel sounds are normal. On palpation she has mild tenderness in epigastric region without guarding or rebound. No organomegaly or masses. No LE edema or clubbing noted.  Labs/studies Results:   Recent Labs  03/12/16 0857  WBC 4.5  HGB 13.6  HCT 41.9  PLT 222    BMET   Recent Labs  03/12/16 0857  NA  141  K 4.5  CL 105  CO2 26  GLUCOSE 91  BUN 12  CREATININE 0.91  CALCIUM 9.5    LFT   Recent Labs  03/12/16 0857  PROT 7.0  ALBUMIN 4.2  AST 36*  ALT 25  ALKPHOS 34  BILITOT 0.7     Assessment:  #1. Epigastric pain of few weeks duration associated with nausea and anorexia. She has been having more pain in the last few days. She does not have leukocytosis. Urine and creatinine are normal. Therefore she  does not appear to be dehydrated. AST is mildly elevated but she said higher AST levels in the past be due to fatty liver. Financial diagnosis includes peptic ulcer disease. Also need to rule out out and creatinine disorder. Clinically she does not appear to have acute pancreatitis. Her symptoms may be due to Fenofibric acid. #2. Solid food dysphagia of  few months duration.  Plan:  Discontinue Finofibric acid. Pantoprazole 40 mg by mouth every morning. Abdominal CT with contrast. If CT is negative and she does not respond to pantoprazole she will need EGD at which time esophagus can be dilated if  appropriate.

## 2016-03-18 ENCOUNTER — Telehealth (INDEPENDENT_AMBULATORY_CARE_PROVIDER_SITE_OTHER): Payer: Self-pay | Admitting: *Deleted

## 2016-03-18 NOTE — Telephone Encounter (Signed)
Per Dr.Rehman - We will do a EGD between now and the end of the year. If this is negative. Will do CT.  Patient will be called and made aware. Forwarded to [place] to address.

## 2016-03-18 NOTE — Telephone Encounter (Signed)
Per Dr.Rehman the patient will need EGD this week or by the end of the year. Ann,Referral Coordinator call patient , as she had a place to put her on for tomorrow,03/19/2016. Patient stated to her that she could not do it tomorrow. Ann advised patient that she would address this with Dr.Rehman.

## 2016-03-18 NOTE — Telephone Encounter (Signed)
Patient was called as follow up . She says that she started Protonix on Saturday ,03/15/2016. She feels that it is helping her , however , she c/o every now and then having a gnawing feeling in her upper stomach. She continues to feel fatigued.  Patient was advised that Dr.Rehman would be made aware and I would contact her with any further recommendations that he may have.

## 2016-03-19 NOTE — Telephone Encounter (Signed)
Forwarded to Dr.Rehman as Juluis Rainier

## 2016-03-20 NOTE — Telephone Encounter (Signed)
Will do EGD after the holidays.

## 2016-03-26 NOTE — Telephone Encounter (Signed)
Let us hold off EGD. She should continue pantoprazole for 6-8 weeks and then stop. If symptoms relapse will do EGD prior to restarting the medication. She can go back on fenofibrate next week. If symptoms relapse she should talk with Dr. Wolfgang Phoenix about changing medication.

## 2016-03-26 NOTE — Telephone Encounter (Signed)
Patient aware of Dr Rehman's recommendation 

## 2016-03-26 NOTE — Telephone Encounter (Signed)
Talked to Ms Guillemette to sch'd EGD, she states since staring the protonix and stopping the Fenofibrate she is feeling much better, nausea is nothing like it was -- wants to know if she still needs EGD, I tentatively have her sch'd for 05/08/16 -- please advise

## 2016-05-13 DIAGNOSIS — H04123 Dry eye syndrome of bilateral lacrimal glands: Secondary | ICD-10-CM | POA: Diagnosis not present

## 2016-05-29 ENCOUNTER — Ambulatory Visit (INDEPENDENT_AMBULATORY_CARE_PROVIDER_SITE_OTHER): Payer: Medicare HMO | Admitting: Family Medicine

## 2016-05-29 ENCOUNTER — Other Ambulatory Visit: Payer: Self-pay

## 2016-05-29 ENCOUNTER — Ambulatory Visit (HOSPITAL_COMMUNITY)
Admission: RE | Admit: 2016-05-29 | Discharge: 2016-05-29 | Disposition: A | Discharge status: Home / Self Care | Payer: Medicare HMO | Source: Ambulatory Visit | Attending: Family Medicine | Admitting: Family Medicine

## 2016-05-29 ENCOUNTER — Encounter: Payer: Self-pay | Admitting: Family Medicine

## 2016-05-29 VITALS — BP 122/74 | Ht 63.0 in | Wt 135.0 lb

## 2016-05-29 DIAGNOSIS — S6991XA Unspecified injury of right wrist, hand and finger(s), initial encounter: Secondary | ICD-10-CM

## 2016-05-29 DIAGNOSIS — S62636A Displaced fracture of distal phalanx of right little finger, initial encounter for closed fracture: Secondary | ICD-10-CM | POA: Diagnosis not present

## 2016-05-29 DIAGNOSIS — R51 Headache: Secondary | ICD-10-CM

## 2016-05-29 DIAGNOSIS — W230XXA Caught, crushed, jammed, or pinched between moving objects, initial encounter: Secondary | ICD-10-CM | POA: Diagnosis not present

## 2016-05-29 DIAGNOSIS — E785 Hyperlipidemia, unspecified: Secondary | ICD-10-CM

## 2016-05-29 DIAGNOSIS — R519 Headache, unspecified: Secondary | ICD-10-CM

## 2016-05-29 DIAGNOSIS — S62609B Fracture of unspecified phalanx of unspecified finger, initial encounter for open fracture: Secondary | ICD-10-CM

## 2016-05-29 NOTE — Progress Notes (Signed)
   Subjective:    Patient ID: Grace [name], female    DOB: 1945-04-24, 71 y.o.   MRN: [id number]  Hand Pain   Incident onset: 9 days ago. Injury mechanism: slammed finger in a door. Pain location: right small finger.   Headaches for months. Has seen Dr. Jorja Loa for eye exam.  Patient was told that she was being seen today because of her finger injury she will need to schedule a follow-up visit for the headaches preliminary interventions discussed Pt concerned about how much calcium and vit d she should be taking. I told her know more in 600 mg calcium daily and vitamin D 400-800 units daily    Review of Systems Finger pain discomfort intermittent headaches no fever chills    Objective:   Physical Exam  Lungs clear heart regular temporal region nontender finger tender to the DIP joint a small finger on right hand      Assessment & Plan:  Finger fracture splint placed need to see orthopedics by next week urgent referral  Patient is due for cholesterol she stopped fenofibrate 3 months ago  Frequent headaches she will do a headache diary and also a depression and anxiety questionnaire plus also she will follow-up somewhere the next few weeks for recheck on that Check sedimentation rate

## 2016-05-30 DIAGNOSIS — E785 Hyperlipidemia, unspecified: Secondary | ICD-10-CM | POA: Diagnosis not present

## 2016-05-30 DIAGNOSIS — R51 Headache: Secondary | ICD-10-CM | POA: Diagnosis not present

## 2016-05-31 LAB — LIPID PANEL
Chol/HDL Ratio: 5.1 ratio units — ABNORMAL HIGH (ref 0.0–4.4)
Cholesterol, Total: 221 mg/dL — ABNORMAL HIGH (ref 100–199)
HDL: 43 mg/dL (ref >39)
LDL Calculated: 144 mg/dL — ABNORMAL HIGH (ref 0–99)
Triglycerides: 171 mg/dL — ABNORMAL HIGH (ref 0–149)
VLDL Cholesterol Cal: 34 mg/dL (ref 5–40)

## 2016-05-31 LAB — SEDIMENTATION RATE: Sed Rate: 2 mm/hr (ref 0–40)

## 2016-06-02 ENCOUNTER — Encounter: Payer: Self-pay | Admitting: Family Medicine

## 2016-06-02 ENCOUNTER — Ambulatory Visit (INDEPENDENT_AMBULATORY_CARE_PROVIDER_SITE_OTHER): Payer: Medicare HMO | Admitting: Family Medicine

## 2016-06-02 VITALS — BP 120/70 | Ht 63.0 in | Wt 135.2 lb

## 2016-06-02 DIAGNOSIS — R51 Headache: Secondary | ICD-10-CM | POA: Diagnosis not present

## 2016-06-02 DIAGNOSIS — E7849 Other hyperlipidemia: Secondary | ICD-10-CM

## 2016-06-02 DIAGNOSIS — E038 Other specified hypothyroidism: Secondary | ICD-10-CM

## 2016-06-02 DIAGNOSIS — E784 Other hyperlipidemia: Secondary | ICD-10-CM

## 2016-06-02 DIAGNOSIS — S62656D Nondisplaced fracture of medial phalanx of right little finger, subsequent encounter for fracture with routine healing: Secondary | ICD-10-CM | POA: Diagnosis not present

## 2016-06-02 DIAGNOSIS — R519 Headache, unspecified: Secondary | ICD-10-CM

## 2016-06-02 NOTE — Progress Notes (Signed)
   Subjective:    Patient ID: Grace [name], female    DOB: 1946/02/07, 71 y.o.   MRN: [id number]  Headache   This is a new problem. The current episode started 1 to 4 weeks ago. The problem occurs intermittently. The problem has been unchanged. The pain is located in the bilateral region. The pain does not radiate. The quality of the pain is described as aching and throbbing. The pain is at a severity of 7/10. The pain is moderate. Pertinent negatives include no abdominal pain or fever. She has tried acetaminophen for the symptoms. The treatment provided mild relief.   Patient is here today for a follow up visit on her headaches. Patient states that headaches are off and on. Onset 3 weeks ago. Treatments tried: tylenol 500 - 1,000 mg with mild relief.      The patient denies excessive stress. She denies being depressed. She states overall energy level doing okay. At times she does have palpitations. She takes her thyroid medicine as directed.  Recent cholesterol profile moderately elevated patient understands the importance of healthy eating. She had been on fenofibrate but stopped it. She does not tolerate statins    Review of Systems  Constitutional: Negative for fatigue and fever.  HENT: Negative for congestion.   Cardiovascular: Negative for chest pain.  Gastrointestinal: Negative for abdominal pain.  Neurological: Positive for headaches.       Objective:   Physical Exam  Constitutional: She appears well-developed.  HENT:  Head: Normocephalic.  Cardiovascular: Normal rate and normal heart sounds.   Pulmonary/Chest: Effort normal. No respiratory distress. She has no wheezes.    She has no prior history of headaches do not wake her up at night there is no vomiting associated with      Assessment & Plan:  Hyperlipidemia healthy diet regular physical activity I highly recommend that the patient repeat her cholesterol profile in 3 months may need to try red yeast rice  extract  Frequent headaches-this is uncharacteristic for the patient she will try relaxation techniques these headaches do not wake her up at night no vomiting associated with it. No blurred vision. She is working with her ophthalmologist regarding regular eye checkups  She will keep track of these headaches if they persist despite conservative measures scan would be indicated she will give [place] feedback within 2 weeks time

## 2016-06-06 DIAGNOSIS — M20011 Mallet finger of right finger(s): Secondary | ICD-10-CM | POA: Diagnosis not present

## 2016-06-06 DIAGNOSIS — M25541 Pain in joints of right hand: Secondary | ICD-10-CM | POA: Diagnosis not present

## 2016-06-16 DIAGNOSIS — S62656D Nondisplaced fracture of medial phalanx of right little finger, subsequent encounter for fracture with routine healing: Secondary | ICD-10-CM | POA: Diagnosis not present

## 2016-06-18 ENCOUNTER — Telehealth: Payer: Self-pay | Admitting: Family Medicine

## 2016-06-18 MED ORDER — LEVOTHYROXINE SODIUM 75 MCG PO TABS: ORAL_TABLET | ORAL | 1 refills | Status: DC

## 2016-06-18 NOTE — Telephone Encounter (Signed)
Spoke with patient and informed her that refills of her Levothyroxine was sent to [facility]. Patient verbalized understanding.

## 2016-06-18 NOTE — Telephone Encounter (Signed)
Patient needs refill on Levothroid 75 mg sent to [facility] home delivery

## 2016-06-19 ENCOUNTER — Telehealth: Payer: Self-pay | Admitting: Family Medicine

## 2016-06-19 NOTE — Telephone Encounter (Signed)
Error please close

## 2016-06-20 DIAGNOSIS — M19041 Primary osteoarthritis, right hand: Secondary | ICD-10-CM | POA: Insufficient documentation

## 2016-06-20 DIAGNOSIS — M20019 Mallet finger of unspecified finger(s): Secondary | ICD-10-CM | POA: Diagnosis not present

## 2016-06-20 DIAGNOSIS — M20011 Mallet finger of right finger(s): Secondary | ICD-10-CM | POA: Diagnosis not present

## 2016-06-20 DIAGNOSIS — S62639A Displaced fracture of distal phalanx of unspecified finger, initial encounter for closed fracture: Secondary | ICD-10-CM | POA: Diagnosis not present

## 2016-06-22 DIAGNOSIS — M20011 Mallet finger of right finger(s): Secondary | ICD-10-CM | POA: Insufficient documentation

## 2016-07-04 ENCOUNTER — Ambulatory Visit (INDEPENDENT_AMBULATORY_CARE_PROVIDER_SITE_OTHER): Payer: Medicare HMO | Admitting: Family Medicine

## 2016-07-04 ENCOUNTER — Encounter: Payer: Self-pay | Admitting: Family Medicine

## 2016-07-04 VITALS — BP 122/82 | Temp 98.3°F | Ht 63.0 in | Wt 136.4 lb

## 2016-07-04 DIAGNOSIS — J329 Chronic sinusitis, unspecified: Secondary | ICD-10-CM

## 2016-07-04 DIAGNOSIS — J310 Chronic rhinitis: Secondary | ICD-10-CM

## 2016-07-04 MED ORDER — CEFDINIR 300 MG PO CAPS: 300.0000 mg | ORAL_CAPSULE | Freq: Two times a day (BID) | ORAL | 0 refills | Status: DC

## 2016-07-04 NOTE — Progress Notes (Signed)
   Subjective:    Patient ID: Grace [name], female    DOB: 10/12/1945, 71 y.o.   MRN: [id number]  Cough  This is a new problem. The current episode started in the past 7 days. Associated symptoms include nasal congestion and a sore throat. Treatments tried: otc cough measures.    WED AFTERNOON DEVELOPED A SORE THROAT itchy and irrit and then painful  yest fot to feeling worse with nose stopped up and nasal cong  Painful under left rib  Not much coughing, just on occasiin     o pain in the sinus area  Pos exposures due to out and about   Felt a little tight nd wheezy     Review of Systems  HENT: Positive for sore throat.   Respiratory: Positive for cough.        Objective:   Physical Exam Alert, mild malaise. Hydration good Vitals stable. frontal/ maxillary tenderness evident positive nasal congestion. pharynx normal neck supple  lungs clear/no crackles or wheezes. heart regular in rhythm        Assessment & Plan:  Impression rhinosinusitis likely post viral, discussed with patient. plan antibiotics prescribed. Questions answered. Symptomatic care discussed. warning signs discussed. WSL

## 2016-08-07 DIAGNOSIS — E038 Other specified hypothyroidism: Secondary | ICD-10-CM | POA: Diagnosis not present

## 2016-08-07 DIAGNOSIS — E784 Other hyperlipidemia: Secondary | ICD-10-CM | POA: Diagnosis not present

## 2016-08-08 LAB — LIPID PANEL
Chol/HDL Ratio: 5.5 ratio — ABNORMAL HIGH (ref 0.0–4.4)
Cholesterol, Total: 230 mg/dL — ABNORMAL HIGH (ref 100–199)
HDL: 42 mg/dL (ref >39)
LDL Calculated: 163 mg/dL — ABNORMAL HIGH (ref 0–99)
Triglycerides: 126 mg/dL (ref 0–149)
VLDL Cholesterol Cal: 25 mg/dL (ref 5–40)

## 2016-08-08 LAB — TSH: TSH: 3.69 u[IU]/mL (ref 0.450–4.500)

## 2016-08-08 LAB — T4, FREE: Free T4: 1.25 ng/dL (ref 0.82–1.77)

## 2016-09-30 ENCOUNTER — Ambulatory Visit (INDEPENDENT_AMBULATORY_CARE_PROVIDER_SITE_OTHER): Payer: Medicare HMO | Admitting: Family Medicine

## 2016-09-30 VITALS — BP 110/64 | Ht 63.0 in | Wt 134.0 lb

## 2016-09-30 DIAGNOSIS — M7122 Synovial cyst of popliteal space [Baker], left knee: Secondary | ICD-10-CM | POA: Diagnosis not present

## 2016-09-30 DIAGNOSIS — M25562 Pain in left knee: Secondary | ICD-10-CM

## 2016-09-30 MED ORDER — DICLOFENAC SODIUM 75 MG PO TBEC: 75.0000 mg | DELAYED_RELEASE_TABLET | Freq: Two times a day (BID) | ORAL | 0 refills | Status: DC

## 2016-09-30 NOTE — Progress Notes (Signed)
   Subjective:    Patient ID: Grace [name], female    DOB: 1946-03-22, 71 y.o.   MRN: [id number]  Knee Pain   Incident onset: 2 months ago. Pain location: left leg, left knee. She has tried acetaminophen and ice for the symptoms.   Patient relates knee pain over the past several weeks and over the past 2 weeks increased pain in the knee and feels like it's swollen as well as behind the knee feels like it's puffy she denies calf pain ankle pain she relates some swelling in her feet she denies wheezing difficulty breathing fever chills or injury denies locking or giving way   Review of Systems    please see above Objective:   Physical Exam lungs clear heart regular pulse normal BP good Both knees examined left knee appears to be mildly swollen compared to the right knee with some fluid When standing patient appears to have a Baker cyst Calf is nontender Circumferential size of both calves are comparable at 15 inches       Assessment & Plan:  Knee pain with swelling Baker cyst probable Follow-up ultrasound recommended this week Anti-inflammatory when necessary Follow-up sooner if any problems may need referral to orthopedics I doubt DVT

## 2016-10-02 ENCOUNTER — Ambulatory Visit (HOSPITAL_COMMUNITY)
Admission: RE | Admit: 2016-10-02 | Discharge: 2016-10-02 | Disposition: A | Discharge status: Home / Self Care | Payer: Medicare HMO | Source: Ambulatory Visit | Attending: Family Medicine | Admitting: Family Medicine

## 2016-10-02 DIAGNOSIS — M25562 Pain in left knee: Secondary | ICD-10-CM | POA: Diagnosis not present

## 2016-10-02 DIAGNOSIS — M7989 Other specified soft tissue disorders: Secondary | ICD-10-CM | POA: Insufficient documentation

## 2016-10-02 DIAGNOSIS — M7122 Synovial cyst of popliteal space [Baker], left knee: Secondary | ICD-10-CM | POA: Insufficient documentation

## 2016-10-02 NOTE — Addendum Note (Signed)
Addended by: Karle Barr on: 10/02/2016 02:38 PM   Modules accepted: Orders

## 2016-10-13 ENCOUNTER — Encounter: Payer: Self-pay | Admitting: Family Medicine

## 2016-10-29 DIAGNOSIS — M1712 Unilateral primary osteoarthritis, left knee: Secondary | ICD-10-CM | POA: Diagnosis not present

## 2016-11-14 DIAGNOSIS — Z1231 Encounter for screening mammogram for malignant neoplasm of breast: Secondary | ICD-10-CM | POA: Diagnosis not present

## 2016-11-17 ENCOUNTER — Telehealth: Payer: Self-pay | Admitting: Family Medicine

## 2016-11-17 NOTE — Telephone Encounter (Signed)
Mammogram negative

## 2016-11-17 NOTE — Telephone Encounter (Signed)
See screening mammogram results in results folder from [facility].

## 2016-12-03 ENCOUNTER — Ambulatory Visit: Payer: Medicare HMO | Admitting: Family Medicine

## 2016-12-05 ENCOUNTER — Encounter (HOSPITAL_COMMUNITY)
Admission: RE | Admit: 2016-12-05 | Discharge: 2016-12-05 | Disposition: A | Discharge status: Home / Self Care | Payer: Medicare HMO | Source: Ambulatory Visit | Attending: Family Medicine | Admitting: Family Medicine

## 2016-12-05 ENCOUNTER — Encounter (HOSPITAL_COMMUNITY): Payer: Medicare HMO

## 2016-12-05 ENCOUNTER — Encounter (HOSPITAL_COMMUNITY): Admission: RE | Admit: 2016-12-05 | Payer: Medicare HMO | Source: Ambulatory Visit

## 2016-12-05 DIAGNOSIS — M810 Age-related osteoporosis without current pathological fracture: Secondary | ICD-10-CM | POA: Insufficient documentation

## 2016-12-05 LAB — COMPREHENSIVE METABOLIC PANEL
ALT: 18 U/L (ref 14–54)
AST: 26 U/L (ref 15–41)
Albumin: 4.1 g/dL (ref 3.5–5.0)
Alkaline Phosphatase: 53 U/L (ref 38–126)
Anion gap: 6 (ref 5–15)
BUN: 11 mg/dL (ref 6–20)
CO2: 29 mmol/L (ref 22–32)
Calcium: 9.2 mg/dL (ref 8.9–10.3)
Chloride: 103 mmol/L (ref 101–111)
Creatinine, Ser: 0.66 mg/dL (ref 0.44–1.00)
GFR calc Af Amer: >60 mL/min (ref >60)
GFR calc non Af Amer: >60 mL/min (ref >60)
Glucose, Bld: 79 mg/dL (ref 65–99)
Potassium: 3.8 mmol/L (ref 3.5–5.1)
Sodium: 138 mmol/L (ref 135–145)
Total Bilirubin: 1 mg/dL (ref 0.3–1.2)
Total Protein: 7 g/dL (ref 6.5–8.1)

## 2016-12-05 MED ORDER — ZOLEDRONIC ACID 5 MG/100ML IV SOLN
5.0000 mg | Freq: Once | INTRAVENOUS | Status: AC
  Administered 2016-12-05: 5 mg via INTRAVENOUS

## 2016-12-05 MED ORDER — SODIUM CHLORIDE 0.9 % IV SOLN
INTRAVENOUS | Status: DC
  Administered 2016-12-05: 250 mL via INTRAVENOUS

## 2016-12-05 MED ORDER — ZOLEDRONIC ACID 5 MG/100ML IV SOLN
INTRAVENOUS | Status: AC
  Filled 2016-12-05: qty 100

## 2016-12-05 NOTE — Progress Notes (Signed)
Results for SYDNEE, LAMOUR (MRN [id number]) as of 12/05/2016 12:06  Ref. Range 12/05/2016 09:40  COMPREHENSIVE METABOLIC PANEL Unknown Rpt  Sodium Latest Ref Range: 135 - 145 mmol/L 138  Potassium Latest Ref Range: 3.5 - 5.1 mmol/L 3.8  Chloride Latest Ref Range: 101 - 111 mmol/L 103  CO2 Latest Ref Range: 22 - 32 mmol/L 29  Glucose Latest Ref Range: 65 - 99 mg/dL 79  BUN Latest Ref Range: 6 - 20 mg/dL 11  Creatinine Latest Ref Range: 0.44 - 1.00 mg/dL 0.66  Calcium Latest Ref Range: 8.9 - 10.3 mg/dL 9.2  Anion gap Latest Ref Range: 5 - 15  6  Alkaline Phosphatase Latest Ref Range: 38 - 126 U/L 53  Albumin Latest Ref Range: 3.5 - 5.0 g/dL 4.1  AST Latest Ref Range: 15 - 41 U/L 26  ALT Latest Ref Range: 14 - 54 U/L 18  Total Protein Latest Ref Range: 6.5 - 8.1 g/dL 7.0  Total Bilirubin Latest Ref Range: 0.3 - 1.2 mg/dL 1.0  GFR, Est Non African American Latest Ref Range: >60 mL/min >60  GFR, Est African American Latest Ref Range: >60 mL/min >60

## 2016-12-23 ENCOUNTER — Ambulatory Visit (INDEPENDENT_AMBULATORY_CARE_PROVIDER_SITE_OTHER): Payer: Medicare HMO | Admitting: Family Medicine

## 2016-12-23 ENCOUNTER — Encounter: Payer: Self-pay | Admitting: Family Medicine

## 2016-12-23 VITALS — BP 104/68 | Ht 63.0 in | Wt 130.0 lb

## 2016-12-23 DIAGNOSIS — E038 Other specified hypothyroidism: Secondary | ICD-10-CM | POA: Diagnosis not present

## 2016-12-23 DIAGNOSIS — H9313 Tinnitus, bilateral: Secondary | ICD-10-CM | POA: Diagnosis not present

## 2016-12-23 DIAGNOSIS — E7849 Other hyperlipidemia: Secondary | ICD-10-CM

## 2016-12-23 DIAGNOSIS — R079 Chest pain, unspecified: Secondary | ICD-10-CM

## 2016-12-23 DIAGNOSIS — E784 Other hyperlipidemia: Secondary | ICD-10-CM

## 2016-12-23 DIAGNOSIS — R252 Cramp and spasm: Secondary | ICD-10-CM | POA: Diagnosis not present

## 2016-12-23 DIAGNOSIS — R69 Illness, unspecified: Secondary | ICD-10-CM | POA: Diagnosis not present

## 2016-12-23 MED ORDER — ALPRAZOLAM 0.25 MG PO TABS: ORAL_TABLET | ORAL | 0 refills | Status: DC

## 2016-12-23 NOTE — Progress Notes (Signed)
   Subjective:    Patient ID: Grace [name], female    DOB: [birth year], 71 y.o.   MRN: [id number]  HPI Patient is here today to follow up on Hypothyroidism. She is on Synthroid 75 mcg one tablet on Tuesday and Sunday and 1/2 tablet on Mondays. States she eats healthy, and gets exercise. This patient does relate some chest pressure tightness and discomfort. Please see below.   States she has been having leg cramps. She does not do any stretching. Hands and feet stay cold-going on for months Wears socks Helps some Even if hot they stay cold Feet get dark red at times   Ears static she relates she does a lot of static in her ears at times has difficult time hearing  Leg cramps- happens several times a week, rubs till better or gets up, no stretches,  Chest discomfort-occurs daily, feels pressure,happens anytime,for a few minutes- feels out of breath at time,sometimes a fast heart beat,no radiation Movement/deep breath/walking up and down steps triggers it  Also deep breathj Goes a way on its own Walks a mile a couple times a week Feels tight with walking   Review of Systems  Constitutional: Negative for activity change, appetite change and fatigue.  HENT: Negative for congestion.   Respiratory: Negative for cough.   Cardiovascular: Negative for chest pain.  Gastrointestinal: Negative for abdominal pain.  Endocrine: Negative for polydipsia and polyphagia.  Skin: Negative for color change.  Neurological: Negative for weakness.  Psychiatric/Behavioral: Negative for confusion.       Objective:   Physical Exam  Constitutional: She appears well-developed and well-nourished. No distress.  HENT:  Head: Normocephalic and atraumatic.  Eyes: Right eye exhibits no discharge. Left eye exhibits no discharge.  Neck: No tracheal deviation present.  Cardiovascular: Normal rate, regular rhythm and normal heart sounds.   No murmur heard. Pulmonary/Chest: Effort normal and breath sounds  normal. No respiratory distress. She has no wheezes. She has no rales.  Musculoskeletal: She exhibits no edema.  Lymphadenopathy:    She has no cervical adenopathy.  Neurological: She is alert. She exhibits normal muscle tone.  Skin: Skin is warm and dry. No erythema.  Psychiatric: Her behavior is normal.  Vitals reviewed.  EKG does not show any significant changes compared to previous no acute changes       Assessment & Plan:  Muscle cramps-stretching exercises were shown, we will check lab work, regular walking for exercise  Hypothyroidism check TSH await results continue medication  Hyperlipidemia patient cannot tolerate statins watch his diet closely  Chest pressure given the frequency of her pressure plus also her cholesterol issues and her age I do believe that this needs to be looked into with stress test and possibly calcium score we'll go ahead and set up with cardiology  Patient with slight cold intolerance more likely related to her age but we'll check thyroid  Potential hearing deficit with mild tinnitus issues no intervention necessary other than protect from loud noises  Cardiology consultation  Xanax prescription for rare use caution drowsiness  Flu vaccine she will get high-dose through [facility]

## 2016-12-24 ENCOUNTER — Telehealth: Payer: Self-pay | Admitting: Family Medicine

## 2016-12-24 ENCOUNTER — Encounter: Payer: Self-pay | Admitting: Family Medicine

## 2016-12-24 DIAGNOSIS — R079 Chest pain, unspecified: Secondary | ICD-10-CM

## 2016-12-24 LAB — TSH: TSH: 1.78 u[IU]/mL (ref 0.450–4.500)

## 2016-12-24 LAB — MAGNESIUM: Magnesium: 2.3 mg/dL (ref 1.6–2.3)

## 2016-12-24 LAB — BASIC METABOLIC PANEL
BUN/Creatinine Ratio: 14 (ref 12–28)
BUN: 10 mg/dL (ref 8–27)
CO2: 26 mmol/L (ref 20–29)
Calcium: 9.4 mg/dL (ref 8.7–10.3)
Chloride: 102 mmol/L (ref 96–106)
Creatinine, Ser: 0.73 mg/dL (ref 0.57–1.00)
GFR calc Af Amer: 97 mL/min/{1.73_m2} (ref >59)
GFR calc non Af Amer: 84 mL/min/{1.73_m2} (ref >59)
Glucose: 99 mg/dL (ref 65–99)
Potassium: 4.7 mmol/L (ref 3.5–5.2)
Sodium: 141 mmol/L (ref 134–144)

## 2016-12-24 NOTE — Telephone Encounter (Signed)
Dr. Nicki Reaper states we're referring pt to cardio & brought me the EKG  No referral in system, please initiate referral in system so that I may send  Sent EKG to [facility] to be scanned in to the chart

## 2016-12-24 NOTE — Telephone Encounter (Signed)
Referral to cardiology ordered in EPIC. Patient notified.

## 2016-12-26 ENCOUNTER — Ambulatory Visit (INDEPENDENT_AMBULATORY_CARE_PROVIDER_SITE_OTHER): Payer: Medicare HMO | Admitting: Cardiovascular Disease

## 2016-12-26 ENCOUNTER — Encounter: Payer: Self-pay | Admitting: Cardiovascular Disease

## 2016-12-26 VITALS — BP 114/58 | HR 85 | Ht 63.0 in | Wt 130.0 lb

## 2016-12-26 DIAGNOSIS — R002 Palpitations: Secondary | ICD-10-CM

## 2016-12-26 DIAGNOSIS — R209 Unspecified disturbances of skin sensation: Secondary | ICD-10-CM

## 2016-12-26 DIAGNOSIS — R079 Chest pain, unspecified: Secondary | ICD-10-CM | POA: Diagnosis not present

## 2016-12-26 DIAGNOSIS — E785 Hyperlipidemia, unspecified: Secondary | ICD-10-CM | POA: Diagnosis not present

## 2016-12-26 DIAGNOSIS — R252 Cramp and spasm: Secondary | ICD-10-CM

## 2016-12-26 MED ORDER — EZETIMIBE 10 MG PO TABS: 10.0000 mg | ORAL_TABLET | Freq: Every day | ORAL | 3 refills | Status: DC

## 2016-12-26 NOTE — Patient Instructions (Addendum)
Your physician recommends that you schedule a follow-up appointment in: 6 weeks Dr Bronson Ing    START Zetia 10 mg daily    Your physician has requested that you have en exercise stress myoview. For further information please visit [email or web address]. Please follow instruction sheet, as given.      Thank you for choosing [facility] !

## 2016-12-26 NOTE — Progress Notes (Signed)
CARDIOLOGY CONSULT NOTE  Patient ID: Grace [name] MRN: [id number] DOB/AGE: 70/03/1945 70 y.o.  Admit date: (Not on file) Primary Physician: Kathyrn Drown, MD Referring Physician: Dr. Sallee Lange  Reason for Consultation: Chest pain  HPI: Grace [name] is a 71 y.o. female who is being seen today for the evaluation of chest pain at the request of Luking, Elayne Snare, MD.   She has a history of hypothyroidism.  ECG performed at PCPs office on 12/24/16 which I personally interpreted demonstrated normal sinus rhythm with no ischemic ST segment or T-wave abnormalities, nor any arrhythmias.  She has been having episodic retrosternal chest pressure which occurs both with and without exertion. Each episode lasts between 2 and 5 minutes. There is occasional radiation to both sides of her neck. She also has associated palpitations at times. These symptoms have been occurring for the past 6 months.  She also describes left calf cramps and coldness of both hands and feet.  I personally reviewed labs performed on 12/23/16: TSH 1.78, sodium 141, potassium 4.7, BUN 10, creatinine 0.73, magnesium 2.3.  Lipids 08/07/16: Total cholesterol 230, trig glycerides 126, LDL 163, HDL 42.  She is statin intolerant.  She is here with her friend, Khanh, who is 33 years old and appears younger than stated age.  Allergies  Allergen Reactions  . Compazine Other (See Comments)    Slurred speech, unsure of other reaction  . Statins Other (See Comments)    myalgias  . Sulfa Drugs Cross Reactors Rash    Current Outpatient Prescriptions  Medication Sig Dispense Refill  . ALPRAZolam (XANAX) 0.25 MG tablet 1/2 or a whole twice a day 30 tablet 0  . cholecalciferol (VITAMIN D) 1000 units tablet Take 1,000 Units by mouth daily.    Marland Kitchen levothyroxine (SYNTHROID, LEVOTHROID) 75 MCG tablet TAKE 1 TABLET TUESDAY TO   SUNDAY AND 1/2 TABLET ON   MONDAY 88 tablet 1   No current facility-administered medications for  this visit.     Past Medical History:  Diagnosis Date  . Cancer (HCC)    Skin Cancer, Squamous Cell  . Diastolic dysfunction 14/97/0263   Ejection fraction 75%.  . Dry eyes, bilateral   . Family history of anesthesia complication    Mother was hard to wake up after anesthesia  . Frequency of urination   . Heart murmur   . Hyperlipidemia    Diet controlled  . Hypothyroid   . Migraine 2013  . Mitral valve prolapse   . TIA (transient ischemic attack) 02/19/2012    Past Surgical History:  Procedure Laterality Date  . BALLOON DILATION N/A 07/28/2013   Procedure: BALLOON DILATION;  Surgeon: Rogene Houston, MD;  Location: [facility];  Service: Endoscopy;  Laterality: N/A;  . BREAST BIOPSY     left benign tumor  . CATARACT EXTRACTION W/PHACO Right 07/11/2013   Procedure: CATARACT EXTRACTION PHACO AND INTRAOCULAR LENS PLACEMENT (IOC);  Surgeon: Tonny Branch, MD;  Location: [facility];  Service: Ophthalmology;  Laterality: Right;  CDE 9.99  . CATARACT EXTRACTION W/PHACO Left 08/04/2013   Procedure: CATARACT EXTRACTION PHACO AND INTRAOCULAR LENS PLACEMENT (IOC);  Surgeon: Tonny Branch, MD;  Location: [facility];  Service: Ophthalmology;  Laterality: Left;  CDE:16.26  . CHOLECYSTECTOMY    . COLONOSCOPY  12/18/2010   Procedure: COLONOSCOPY;  Surgeon: Rogene Houston, MD;  Location: [facility];  Service: Endoscopy;  Laterality: N/A;  1:00 pm  . COLONOSCOPY N/A 03/06/2016  Procedure: COLONOSCOPY;  Surgeon: Rogene Houston, MD;  [facility]: [facility];  Service: Endoscopy;  Laterality: N/A;  1200  . ESOPHAGOGASTRODUODENOSCOPY    . ESOPHAGOGASTRODUODENOSCOPY N/A 07/28/2013   Procedure: ESOPHAGOGASTRODUODENOSCOPY (EGD);  Surgeon: Rogene Houston, MD;  [facility]: [facility];  Service: Endoscopy;  Laterality: N/A;  125  . MALONEY DILATION N/A 07/28/2013   Procedure: MALONEY DILATION;  Surgeon: Rogene Houston, MD;  [facility]: [facility];  Service: Endoscopy;  Laterality: N/A;  . SAVORY DILATION N/A  07/28/2013   Procedure: SAVORY DILATION;  Surgeon: Rogene Houston, MD;  Location: [facility];  Service: Endoscopy;  Laterality: N/A;  . THYROIDECTOMY  11/20/2011   Procedure: THYROIDECTOMY;  Surgeon: Izora Gala, MD;  Location: [facility];  Service: ENT;  Laterality: Left;  LEFT THYROID LOBECTOMY  . TUBAL LIGATION     39 yrs ago.    Social History   Social History  . Marital status: Widowed    Spouse name: N/A  . Number of children: N/A  . Years of education: N/A   Occupational History  . Not on file.   Social History Main Topics  . Smoking status: Never Smoker  . Smokeless tobacco: Never Used  . Alcohol use No  . Drug use: No  . Sexual activity: Not Currently   Other Topics Concern  . Not on file   Social History Narrative  . No narrative on file     No family history of premature CAD in 1st degree relatives.  Current Meds  Medication Sig  . ALPRAZolam (XANAX) 0.25 MG tablet 1/2 or a whole twice a day  . cholecalciferol (VITAMIN D) 1000 units tablet Take 1,000 Units by mouth daily.  Marland Kitchen levothyroxine (SYNTHROID, LEVOTHROID) 75 MCG tablet TAKE 1 TABLET TUESDAY TO   SUNDAY AND 1/2 TABLET ON   MONDAY      Review of systems complete and found to be negative unless listed above in HPI    Physical exam Blood pressure 118/60, pulse 85, height 5\' 3"  (1.6 m), weight 130 lb (59 kg), SpO2 96 %. General: NAD Neck: No JVD, no thyromegaly or thyroid nodule.  Lungs: Clear to auscultation bilaterally with normal respiratory effort. CV: Nondisplaced PMI. Regular rate and rhythm, normal S1/S2, no S3/S4, no murmur.  No peripheral edema.  No carotid bruit.    Abdomen: Soft, nontender, no distention.  Skin: Intact without lesions or rashes.  Neurologic: Alert and oriented x 3.  Psych: Normal affect. Extremities: No clubbing or cyanosis.  HEENT: Normal.   ECG: Most recent ECG reviewed.   Labs: Lab Results  Component Value Date/Time   K 4.7 12/23/2016 10:14 AM   BUN 10  12/23/2016 10:14 AM   CREATININE 0.73 12/23/2016 10:14 AM   CREATININE 0.91 03/12/2016 08:57 AM   ALT 18 12/05/2016 09:40 AM   TSH 1.780 12/23/2016 10:14 AM   HGB 13.6 03/12/2016 08:57 AM   HGB 12.9 06/05/2015 09:50 AM     Lipids: Lab Results  Component Value Date/Time   LDLCALC 163 (H) 08/07/2016 08:32 AM   CHOL 230 (H) 08/07/2016 08:32 AM   TRIG 126 08/07/2016 08:32 AM   HDL 42 08/07/2016 08:32 AM        ASSESSMENT AND PLAN:  1. Chest pressure and palpitations: Primary risk factor for ischemic heart diseases hyperlipidemia. I will proceed with a nuclear myocardial perfusion imaging study to evaluate for ischemic heart disease (exercise Myoview).  2. Left calf cramps with cold feet: By  physical examination, she has good arterial flow with normal pulses. I have recommended some home remedies.  3. Hyperlipidemia: Lipids markedly elevated on 08/07/16 as reviewed above in detail. She is statin intolerant. I will start Zetia 10 mg daily and repeat lipids in 4 months. She may be a candidate for PCSK-9 inhibitors.     Disposition: Follow up in 6 weeks.  Signed: Kate Sable, M.D., F.A.C.C.  12/26/2016, 2:30 PM

## 2017-01-01 ENCOUNTER — Encounter (HOSPITAL_COMMUNITY)
Admission: RE | Admit: 2017-01-01 | Discharge: 2017-01-01 | Disposition: A | Discharge status: Home / Self Care | Payer: Medicare HMO | Source: Ambulatory Visit | Attending: Cardiovascular Disease | Admitting: Cardiovascular Disease

## 2017-01-01 ENCOUNTER — Encounter (HOSPITAL_COMMUNITY): Payer: Self-pay

## 2017-01-01 ENCOUNTER — Encounter (HOSPITAL_BASED_OUTPATIENT_CLINIC_OR_DEPARTMENT_OTHER)
Admission: RE | Admit: 2017-01-01 | Discharge: 2017-01-01 | Disposition: A | Discharge status: Home / Self Care | Payer: Medicare HMO | Source: Ambulatory Visit | Attending: Cardiovascular Disease | Admitting: Cardiovascular Disease

## 2017-01-01 DIAGNOSIS — R079 Chest pain, unspecified: Secondary | ICD-10-CM | POA: Insufficient documentation

## 2017-01-01 LAB — NM MYOCAR MULTI W/SPECT W/WALL MOTION / EF
Estimated workload: 7 METS
Exercise duration (min): 6 min
Exercise duration (sec): 0 s
LV dias vol: 61 mL (ref 46–106)
LV sys vol: 20 mL
MPHR: 151 {beats}/min
Peak HR: 150 {beats}/min
Percent HR: 99 %
RATE: 0.33
RPE: 15
Rest HR: 63 {beats}/min
SDS: 0
SRS: 0
SSS: 0
TID: 1.17

## 2017-01-01 MED ORDER — REGADENOSON 0.4 MG/5ML IV SOLN
INTRAVENOUS | Status: AC
  Filled 2017-01-01: qty 5

## 2017-01-01 MED ORDER — TECHNETIUM TC 99M TETROFOSMIN IV KIT
10.0000 | PACK | Freq: Once | INTRAVENOUS | Status: AC | PRN
  Administered 2017-01-01: 10.8 via INTRAVENOUS

## 2017-01-01 MED ORDER — SODIUM CHLORIDE 0.9% FLUSH
INTRAVENOUS | Status: AC
  Administered 2017-01-01: 10 mL via INTRAVENOUS
  Filled 2017-01-01: qty 10

## 2017-01-01 MED ORDER — TECHNETIUM TC 99M TETROFOSMIN IV KIT
30.0000 | PACK | Freq: Once | INTRAVENOUS | Status: AC | PRN
  Administered 2017-01-01: 30.5 via INTRAVENOUS

## 2017-01-02 ENCOUNTER — Telehealth: Payer: Self-pay

## 2017-01-02 NOTE — Telephone Encounter (Signed)
-----   Message from Herminio Commons, MD sent at 01/01/2017  5:06 PM EDT ----- No obvious blockages. There were ECG abnormalities and blood pressure became excessively high, which may have caused these. I will manage with medications.

## 2017-01-02 NOTE — Telephone Encounter (Signed)
Called pt., no answer. Left message for pt. To return call.  

## 2017-01-13 DIAGNOSIS — D1800 Hemangioma unspecified site: Secondary | ICD-10-CM | POA: Diagnosis not present

## 2017-01-13 DIAGNOSIS — Z86018 Personal history of other benign neoplasm: Secondary | ICD-10-CM | POA: Diagnosis not present

## 2017-01-13 DIAGNOSIS — L821 Other seborrheic keratosis: Secondary | ICD-10-CM | POA: Diagnosis not present

## 2017-01-13 DIAGNOSIS — D225 Melanocytic nevi of trunk: Secondary | ICD-10-CM | POA: Diagnosis not present

## 2017-01-13 DIAGNOSIS — Z23 Encounter for immunization: Secondary | ICD-10-CM | POA: Diagnosis not present

## 2017-01-13 DIAGNOSIS — Z85828 Personal history of other malignant neoplasm of skin: Secondary | ICD-10-CM | POA: Diagnosis not present

## 2017-01-13 DIAGNOSIS — L814 Other melanin hyperpigmentation: Secondary | ICD-10-CM | POA: Diagnosis not present

## 2017-01-13 DIAGNOSIS — L570 Actinic keratosis: Secondary | ICD-10-CM | POA: Diagnosis not present

## 2017-02-09 ENCOUNTER — Other Ambulatory Visit: Payer: Self-pay

## 2017-02-09 ENCOUNTER — Emergency Department (HOSPITAL_COMMUNITY): Payer: Medicare HMO

## 2017-02-09 ENCOUNTER — Encounter (HOSPITAL_COMMUNITY): Payer: Self-pay | Admitting: Cardiology

## 2017-02-09 ENCOUNTER — Observation Stay (HOSPITAL_COMMUNITY)
Admission: EM | Admit: 2017-02-09 | Discharge: 2017-02-10 | Disposition: A | Discharge status: Home / Self Care | Payer: Medicare HMO | Attending: Family Medicine | Admitting: Family Medicine

## 2017-02-09 ENCOUNTER — Telehealth: Payer: Self-pay | Admitting: Cardiovascular Disease

## 2017-02-09 ENCOUNTER — Telehealth: Payer: Self-pay | Admitting: Family Medicine

## 2017-02-09 ENCOUNTER — Observation Stay (HOSPITAL_COMMUNITY): Payer: Medicare HMO

## 2017-02-09 DIAGNOSIS — G459 Transient cerebral ischemic attack, unspecified: Principal | ICD-10-CM | POA: Diagnosis present

## 2017-02-09 DIAGNOSIS — E039 Hypothyroidism, unspecified: Secondary | ICD-10-CM | POA: Insufficient documentation

## 2017-02-09 DIAGNOSIS — R4701 Aphasia: Secondary | ICD-10-CM | POA: Diagnosis present

## 2017-02-09 DIAGNOSIS — R4789 Other speech disturbances: Secondary | ICD-10-CM | POA: Diagnosis not present

## 2017-02-09 DIAGNOSIS — R2689 Other abnormalities of gait and mobility: Secondary | ICD-10-CM | POA: Insufficient documentation

## 2017-02-09 DIAGNOSIS — I6523 Occlusion and stenosis of bilateral carotid arteries: Secondary | ICD-10-CM | POA: Diagnosis not present

## 2017-02-09 DIAGNOSIS — R2681 Unsteadiness on feet: Secondary | ICD-10-CM | POA: Insufficient documentation

## 2017-02-09 DIAGNOSIS — I639 Cerebral infarction, unspecified: Secondary | ICD-10-CM | POA: Diagnosis not present

## 2017-02-09 DIAGNOSIS — Z85828 Personal history of other malignant neoplasm of skin: Secondary | ICD-10-CM | POA: Diagnosis not present

## 2017-02-09 DIAGNOSIS — M6281 Muscle weakness (generalized): Secondary | ICD-10-CM | POA: Insufficient documentation

## 2017-02-09 DIAGNOSIS — Z79899 Other long term (current) drug therapy: Secondary | ICD-10-CM | POA: Diagnosis not present

## 2017-02-09 DIAGNOSIS — R4781 Slurred speech: Secondary | ICD-10-CM | POA: Diagnosis not present

## 2017-02-09 LAB — CBC
HCT: 43.6 % (ref 36.0–46.0)
Hemoglobin: 14.4 g/dL (ref 12.0–15.0)
MCH: 29.9 pg (ref 26.0–34.0)
MCHC: 33 g/dL (ref 30.0–36.0)
MCV: 90.5 fL (ref 78.0–100.0)
Platelets: 186 10*3/uL (ref 150–400)
RBC: 4.82 MIL/uL (ref 3.87–5.11)
RDW: 12.4 % (ref 11.5–15.5)
WBC: 7.4 10*3/uL (ref 4.0–10.5)

## 2017-02-09 LAB — I-STAT TROPONIN, ED: Troponin i, poc: 0 ng/mL (ref 0.00–0.08)

## 2017-02-09 LAB — URINALYSIS, ROUTINE W REFLEX MICROSCOPIC
Bilirubin Urine: NEGATIVE
Glucose, UA: NEGATIVE mg/dL
Hgb urine dipstick: NEGATIVE
Ketones, ur: NEGATIVE mg/dL
Leukocytes, UA: NEGATIVE
Nitrite: NEGATIVE
Protein, ur: NEGATIVE mg/dL
Specific Gravity, Urine: <1.005 — ABNORMAL LOW (ref 1.005–1.030)
pH: 6.5 (ref 5.0–8.0)

## 2017-02-09 LAB — COMPREHENSIVE METABOLIC PANEL
ALT: 17 U/L (ref 14–54)
AST: 23 U/L (ref 15–41)
Albumin: 4.5 g/dL (ref 3.5–5.0)
Alkaline Phosphatase: 54 U/L (ref 38–126)
Anion gap: 6 (ref 5–15)
BUN: 10 mg/dL (ref 6–20)
CO2: 30 mmol/L (ref 22–32)
Calcium: 9.7 mg/dL (ref 8.9–10.3)
Chloride: 100 mmol/L — ABNORMAL LOW (ref 101–111)
Creatinine, Ser: 0.73 mg/dL (ref 0.44–1.00)
GFR calc Af Amer: >60 mL/min (ref >60)
GFR calc non Af Amer: >60 mL/min (ref >60)
Glucose, Bld: 85 mg/dL (ref 65–99)
Potassium: 4.3 mmol/L (ref 3.5–5.1)
Sodium: 136 mmol/L (ref 135–145)
Total Bilirubin: 1.1 mg/dL (ref 0.3–1.2)
Total Protein: 7.9 g/dL (ref 6.5–8.1)

## 2017-02-09 LAB — DIFFERENTIAL
Basophils Absolute: 0 10*3/uL (ref 0.0–0.1)
Basophils Relative: 0 %
Eosinophils Absolute: 0.1 10*3/uL (ref 0.0–0.7)
Eosinophils Relative: 1 %
Lymphocytes Relative: 21 %
Lymphs Abs: 1.5 10*3/uL (ref 0.7–4.0)
Monocytes Absolute: 0.7 10*3/uL (ref 0.1–1.0)
Monocytes Relative: 9 %
Neutro Abs: 5.1 10*3/uL (ref 1.7–7.7)
Neutrophils Relative %: 69 %

## 2017-02-09 LAB — APTT: aPTT: 33 seconds (ref 24–36)

## 2017-02-09 LAB — PROTIME-INR
INR: 0.98
Prothrombin Time: 12.9 seconds (ref 11.4–15.2)

## 2017-02-09 LAB — TSH: TSH: 2.192 u[IU]/mL (ref 0.350–4.500)

## 2017-02-09 MED ORDER — ACETAMINOPHEN 325 MG PO TABS
650.0000 mg | ORAL_TABLET | ORAL | Status: DC | PRN
Start: 2017-02-09 — End: 2017-02-10

## 2017-02-09 MED ORDER — STROKE: EARLY STAGES OF RECOVERY BOOK
Freq: Once | Status: AC
  Administered 2017-02-10: 11:00:00
  Filled 2017-02-09: qty 1

## 2017-02-09 MED ORDER — EZETIMIBE 10 MG PO TABS
10.0000 mg | ORAL_TABLET | Freq: Every day | ORAL | Status: DC
  Administered 2017-02-09 – 2017-02-10 (×2): 10 mg via ORAL
  Filled 2017-02-09 (×2): qty 1

## 2017-02-09 MED ORDER — ASPIRIN 325 MG PO TABS
325.0000 mg | ORAL_TABLET | Freq: Every day | ORAL | Status: DC
  Administered 2017-02-09 – 2017-02-10 (×2): 325 mg via ORAL
  Filled 2017-02-09 (×2): qty 1

## 2017-02-09 MED ORDER — ASPIRIN 81 MG PO CHEW
324.0000 mg | CHEWABLE_TABLET | Freq: Once | ORAL | Status: AC
  Administered 2017-02-09: 324 mg via ORAL
  Filled 2017-02-09: qty 4

## 2017-02-09 MED ORDER — ENOXAPARIN SODIUM 40 MG/0.4ML ~~LOC~~ SOLN
40.0000 mg | SUBCUTANEOUS | Status: DC
  Administered 2017-02-09: 40 mg via SUBCUTANEOUS
  Filled 2017-02-09: qty 0.4

## 2017-02-09 MED ORDER — ASPIRIN 300 MG RE SUPP: 300.0000 mg | Freq: Every day | RECTAL | Status: DC

## 2017-02-09 MED ORDER — LEVOTHYROXINE SODIUM 75 MCG PO TABS
75.0000 ug | ORAL_TABLET | Freq: Every day | ORAL | Status: DC
  Administered 2017-02-10: 75 ug via ORAL
  Filled 2017-02-09: qty 1

## 2017-02-09 MED ORDER — SENNOSIDES-DOCUSATE SODIUM 8.6-50 MG PO TABS
1.0000 | ORAL_TABLET | Freq: Every evening | ORAL | Status: DC | PRN
  Filled 2017-02-09: qty 1

## 2017-02-09 MED ORDER — SODIUM CHLORIDE 0.9 % IV SOLN
INTRAVENOUS | Status: DC
  Administered 2017-02-09: 19:00:00 via INTRAVENOUS

## 2017-02-09 MED ORDER — ACETAMINOPHEN 650 MG RE SUPP: 650.0000 mg | RECTAL | Status: DC | PRN

## 2017-02-09 MED ORDER — ACETAMINOPHEN 160 MG/5ML PO SOLN: 650.0000 mg | ORAL | Status: DC | PRN

## 2017-02-09 NOTE — Telephone Encounter (Signed)
Patient had a TIA this past Saturday afternoon.  She is requesting a nurse to call her back to discuss this.

## 2017-02-09 NOTE — H&P (Signed)
History and Physical    Grace [name] ZOX:[id number] DOB: 10-14-1945 DOA: 02/09/2017  Referring MD/NP/PA: Lita Mains  PCP: Kathyrn Drown, MD  Outpatient Specialists: None   Patient coming from: Home   Chief Complaint: TIA   HPI: Grace [name] is a 71 y.o. female with medical history significant of HLD, ? TIA, diastolic dysfunction  Presenting w/ TIA. Per pt and family, pt has had multiple episodes of slurred speech and blurred vision that last approx 15 minutes over last several months. No reported fevers or chills. No CP, SOB. No hemiparesis. Pt does not take aspirin daily. Last episode has been within the past week. Denies any head trauma. Though noted in her history, pt does not report any prior hx/o TIA in the past.   ED Course: Presented to ER afebrile, hemodynamically stable. CXR pending. CT head WNL.   Review of Systems: As per HPI otherwise 10 point review of systems negative.    Past Medical History:  Diagnosis Date  . Cancer (HCC)    Skin Cancer, Squamous Cell  . Diastolic dysfunction 81/19/1478   Ejection fraction 75%.  . Dry eyes, bilateral   . Family history of anesthesia complication    Mother was hard to wake up after anesthesia  . Frequency of urination   . Heart murmur   . Hyperlipidemia    Diet controlled  . Hypothyroid   . Migraine 2013  . Mitral valve prolapse   . TIA (transient ischemic attack) 02/19/2012    Past Surgical History:  Procedure Laterality Date  . BREAST BIOPSY     left benign tumor  . CHOLECYSTECTOMY    . ESOPHAGOGASTRODUODENOSCOPY    . TUBAL LIGATION     38 yrs ago.     reports that  has never smoked. she has never used smokeless tobacco. She reports that she does not drink alcohol or use drugs.  Allergies  Allergen Reactions  . Compazine Other (See Comments)    Slurred speech, unsure of other reaction  . Statins Other (See Comments)    myalgias  . Sulfa Drugs Cross Reactors Rash    Family History  Problem Relation Age  of Onset  . Heart disease Mother   . Hypertension Mother   . Stroke Mother   . Stroke Maternal Grandmother   . Colon cancer Neg Hx      Prior to Admission medications   Medication Sig Start Date End Date Taking? Authorizing Provider  ALPRAZolam Duanne Moron) 0.25 MG tablet 1/2 or a whole twice a day 12/23/16  Yes Luking, Elayne Snare, MD  ezetimibe (ZETIA) 10 MG tablet Take 1 tablet (10 mg total) by mouth daily. 12/26/16 03/26/17 Yes Herminio Commons, MD  levothyroxine (SYNTHROID, LEVOTHROID) 75 MCG tablet TAKE 1 TABLET TUESDAY TO   SUNDAY AND 1/2 TABLET ON   MONDAY 06/18/16  Yes Kathyrn Drown, MD    Physical Exam: Vitals:   02/09/17 1315 02/09/17 1330 02/09/17 1424 02/09/17 1521  BP:  (!) 133/97  135/71  Pulse: (!) 58   (!) 59  Resp: 20 (!) 25  16  Temp:   98.1 F (36.7 C)   TempSrc:      SpO2: 96%   98%  Weight:      Height:          Constitutional: NAD, calm, comfortable Vitals:   02/09/17 1315 02/09/17 1330 02/09/17 1424 02/09/17 1521  BP:  (!) 133/97  135/71  Pulse: (!) 58   (!) 59  Resp: 20 (!) 25  16  Temp:   98.1 F (36.7 C)   TempSrc:      SpO2: 96%   98%  Weight:      Height:       Eyes: PERRL, lids and conjunctivae normal ENMT: Mucous membranes are moist. Posterior pharynx clear of any exudate or lesions.Normal dentition.  Neck: normal, supple, no masses, no thyromegaly Respiratory: clear to auscultation bilaterally, no wheezing, no crackles. Normal respiratory effort. No accessory muscle use.  Cardiovascular: Regular rate and rhythm, no murmurs / rubs / gallops. No extremity edema. 2+ pedal pulses. No carotid bruits.  Abdomen: no tenderness, no masses palpated. No hepatosplenomegaly. Bowel sounds positive.  Musculoskeletal: no clubbing / cyanosis. No joint deformity upper and lower extremities. Good ROM, no contractures. Normal muscle tone. Mild LLE weakness. Chronic per pt assd w/ hip pain. Also w/ chronic L post knee pain.   Skin: no rashes, lesions, ulcers.  No induration Neurologic: CN 2-12 grossly intact. Sensation intact, DTR normal. Strength 5/5 in all 4.  Psychiatric: Normal judgment and insight. Alert and oriented x 3. Normal mood.    Labs on Admission: I have personally reviewed following labs and imaging studies  CBC: Recent Labs  Lab 02/09/17 1257  WBC 7.4  NEUTROABS 5.1  HGB 14.4  HCT 43.6  MCV 90.5  PLT 161   Basic Metabolic Panel: Recent Labs  Lab 02/09/17 1257  NA 136  K 4.3  CL 100*  CO2 30  GLUCOSE 85  BUN 10  CREATININE 0.73  CALCIUM 9.7   GFR: Estimated Creatinine Clearance: 51.8 mL/min (by C-G formula based on SCr of 0.73 mg/dL). Liver Function Tests: Recent Labs  Lab 02/09/17 1257  AST 23  ALT 17  ALKPHOS 54  BILITOT 1.1  PROT 7.9  ALBUMIN 4.5   No results for input(s): LIPASE, AMYLASE in the last 168 hours. No results for input(s): AMMONIA in the last 168 hours. Coagulation Profile: Recent Labs  Lab 02/09/17 1257  INR 0.98   Cardiac Enzymes: No results for input(s): CKTOTAL, CKMB, CKMBINDEX, TROPONINI in the last 168 hours. BNP (last 3 results) No results for input(s): PROBNP in the last 8760 hours. HbA1C: No results for input(s): HGBA1C in the last 72 hours. CBG: No results for input(s): GLUCAP in the last 168 hours. Lipid Profile: No results for input(s): CHOL, HDL, LDLCALC, TRIG, CHOLHDL, LDLDIRECT in the last 72 hours. Thyroid Function Tests: No results for input(s): TSH, T4TOTAL, FREET4, T3FREE, THYROIDAB in the last 72 hours. Anemia Panel: No results for input(s): VITAMINB12, FOLATE, FERRITIN, TIBC, IRON, RETICCTPCT in the last 72 hours. Urine analysis:    Component Value Date/Time   COLORURINE STRAW (A) 02/09/2017 1240   APPEARANCEUR CLEAR 02/09/2017 1240   LABSPEC <1.005 (L) 02/09/2017 1240   PHURINE 6.5 02/09/2017 1240   GLUCOSEU NEGATIVE 02/09/2017 1240   HGBUR NEGATIVE 02/09/2017 [place] 02/09/2017 1240   KETONESUR NEGATIVE 02/09/2017 1240    PROTEINUR NEGATIVE 02/09/2017 1240   UROBILINOGEN 0.2 02/20/2012 0830   NITRITE NEGATIVE 02/09/2017 1240   LEUKOCYTESUR NEGATIVE 02/09/2017 1240   Sepsis Labs: @LABRCNTIP (procalcitonin:4,lacticidven:4) )No results found for this or any previous visit (from the past 240 hour(s)).   Radiological Exams on Admission: Ct Head Wo Contrast  Result Date: 02/09/2017 CLINICAL DATA:  Slurred speech and blurred vision EXAM: CT HEAD WITHOUT CONTRAST TECHNIQUE: Contiguous axial images were obtained from the base of the skull through the vertex without intravenous contrast. COMPARISON:  Head  CT February 19, 2012 and brain MRI February 20, 2012 FINDINGS: Brain: The ventricles are normal in size and configuration. There is no intracranial mass, hemorrhage, extra-axial fluid collection, or midline shift. Gray-white compartments appear normal. No acute infarct is appreciable. Vascular: No hyperdense vessel. There is slight calcification in each carotid siphon region. Skull: The bony calvarium appears intact. Sinuses/Orbits: There is mucosal thickening in multiple ethmoid air cells. There is slight mucosal thickening in the posterior sphenoid sinus region. Other visualized paranasal sinuses are clear. Visualized orbits appear symmetric bilaterally. Other: Mastoid air cells are clear. IMPRESSION: No intracranial mass, hemorrhage, or extra-axial fluid collection. Gray-white compartments appear normal. There is mild arteriovascular calcification. There are areas of paranasal sinus disease. Electronically Signed   By: Lowella Grip III M.D.   On: 02/09/2017 14:16     Assessment/Plan Active Problems:   TIA (transient ischemic attack)     1-TIA -recurrent sxs over last few months per report; reassuring exam today  -noted admission for similar 01/2012  -start on full dose ASA -pending formal neuro c/s ([place] notified per EDP)  -MRI, MRA, 2D ECHO, risk stratification labs    2-Hypothyroidism -check TSH     DVT prophylaxis: lovenox  Code Status: Full Code   Family Communication: Family at bedside   Disposition Plan: Pending further evaluation   Consults called: [place]   Admission status: Obs   Deneise Lever MD [facility] Pager [phone]  If 7PM-7AM, please contact night-coverage [email or web address] Password [facility]  02/09/2017, 4:19 PM

## 2017-02-09 NOTE — ED Provider Notes (Signed)
[facility] EMERGENCY DEPARTMENT Provider Note   CSN: [id number] Arrival date & time: 02/09/17  1051     History   Chief Complaint Chief Complaint  Patient presents with  . Aphasia    HPI Grace [name] is a 71 y.o. female.  HPI Patient states that Saturday she had an episode of expressive aphasia and blurred vision that lasted roughly 15 minutes.  She states symptoms have mostly resolved though she still has some changes in her vision and occasional difficulty with word finding.  Had similar episode several months ago which spontaneously resolved.  She denies any headache.  She denies focal weakness or numbness.  No gait change. Past Medical History:  Diagnosis Date  . Cancer (HCC)    Skin Cancer, Squamous Cell  . Diastolic dysfunction 63/87/5643   Ejection fraction 75%.  . Dry eyes, bilateral   . Family history of anesthesia complication    Mother was hard to wake up after anesthesia  . Frequency of urination   . Heart murmur   . Hyperlipidemia    Diet controlled  . Hypothyroid   . Migraine 2013  . Mitral valve prolapse   . TIA (transient ischemic attack) 02/19/2012    Patient Active Problem List   Diagnosis Date Noted  . History of colonic polyps 11/26/2015  . Genital herpes simplex type 2 05/14/2015  . Hyperlipemia 01/15/2015  . Pernicious anemia 11/04/2013  . Fatigue 09/19/2013  . Early satiety 07/27/2013  . Dysphagia, unspecified(787.20) 07/27/2013  . Loss of weight 07/18/2013  . Osteoporosis, unspecified 01/25/2013  . GERD (gastroesophageal reflux disease) 11/16/2012  . Irritable bowel syndrome 11/16/2012  . Sinus bradycardia 02/20/2012  . Diastolic dysfunction 32/95/1884  . TIA (transient ischemic attack) 02/19/2012  . Heart murmur 02/19/2012  . Hypothyroidism 11/28/2010  . Hypercholesterolemia 11/28/2010    Past Surgical History:  Procedure Laterality Date  . BREAST BIOPSY     left benign tumor  . CHOLECYSTECTOMY    .  ESOPHAGOGASTRODUODENOSCOPY    . TUBAL LIGATION     38 yrs ago.    OB History    No data available       Home Medications    Prior to Admission medications   Medication Sig Start Date End Date Taking? Authorizing Provider  ALPRAZolam Duanne Moron) 0.25 MG tablet 1/2 or a whole twice a day 12/23/16  Yes Luking, Elayne Snare, MD  ezetimibe (ZETIA) 10 MG tablet Take 1 tablet (10 mg total) by mouth daily. 12/26/16 03/26/17 Yes Herminio Commons, MD  levothyroxine (SYNTHROID, LEVOTHROID) 75 MCG tablet TAKE 1 TABLET TUESDAY TO   SUNDAY AND 1/2 TABLET ON   MONDAY 06/18/16  Yes Kathyrn Drown, MD    Family History Family History  Problem Relation Age of Onset  . Heart disease Mother   . Hypertension Mother   . Stroke Mother   . Stroke Maternal Grandmother   . Colon cancer Neg Hx     Social History Social History   Tobacco Use  . Smoking status: Never Smoker  . Smokeless tobacco: Never Used  Substance Use Topics  . Alcohol use: No    Alcohol/week: 0.0 oz  . Drug use: No     Allergies   Compazine; Statins; and Sulfa drugs cross reactors   Review of Systems Review of Systems  Constitutional: Negative for chills.  Eyes: Positive for visual disturbance.  Respiratory: Negative for cough and shortness of breath.   Cardiovascular: Negative for chest pain, palpitations and leg swelling.  Gastrointestinal: Negative for abdominal pain, diarrhea, nausea and vomiting.  Genitourinary: Negative for dysuria and flank pain.  Musculoskeletal: Negative for back pain, gait problem, myalgias and neck pain.  Skin: Negative for rash and wound.  Neurological: Positive for speech difficulty. Negative for dizziness, weakness, light-headedness, numbness and headaches.  All other systems reviewed and are negative.    Physical Exam Updated Vital Signs BP 135/71 (BP Location: Right Arm)   Pulse (!) 59   Temp 98.1 F (36.7 C)   Resp 16   Ht 5\' 2"  (1.575 m)   Wt 59 kg (130 lb)   SpO2 98%   BMI  23.78 kg/m   Physical Exam  Constitutional: She is oriented to person, place, and time. She appears well-developed and well-nourished. No distress.  HENT:  Head: Normocephalic and atraumatic.  Mouth/Throat: Oropharynx is clear and moist. No oropharyngeal exudate.  Cranial nerves II through XII grossly intact.  Eyes: EOM are normal. Pupils are equal, round, and reactive to light.  Visual fields intact  Neck: Normal range of motion. Neck supple.  Cardiovascular: Normal rate and regular rhythm. Exam reveals no gallop and no friction rub.  No murmur heard. Pulmonary/Chest: Effort normal and breath sounds normal.  Abdominal: Soft. Bowel sounds are normal. There is no tenderness. There is no rebound and no guarding.  Musculoskeletal: Normal range of motion. She exhibits no edema or tenderness.  Neurological: She is alert and oriented to person, place, and time.  Patient is alert and oriented x3 with clear, goal oriented speech. Patient has 5/5 motor in all extremities. Sensation is intact to light touch. Bilateral finger-to-nose is normal with no signs of dysmetria.  Skin: Skin is warm and dry. Capillary refill takes less than 2 seconds. No rash noted. No erythema.  Psychiatric: She has a normal mood and affect. Her behavior is normal.  Nursing note and vitals reviewed.    ED Treatments / Results  Labs (all labs ordered are listed, but only abnormal results are displayed) Labs Reviewed  COMPREHENSIVE METABOLIC PANEL - Abnormal; Notable for the following components:      Result Value   Chloride 100 (*)    All other components within normal limits  URINALYSIS, ROUTINE W REFLEX MICROSCOPIC - Abnormal; Notable for the following components:   Color, Urine STRAW (*)    Specific Gravity, Urine <1.005 (*)    All other components within normal limits  PROTIME-INR  APTT  CBC  DIFFERENTIAL  I-STAT TROPONIN, ED    EKG  EKG Interpretation  Date/Time:  Monday February 09 2017 12:56:47  EST Ventricular Rate:  57 PR Interval:    QRS Duration: 90 QT Interval:  423 QTC Calculation: 412 R Axis:   -33 Text Interpretation:  Sinus rhythm Left axis deviation Anteroseptal infarct, age indeterminate Confirmed by Julianne Rice [phone]) on 02/09/2017 1:02:26 PM       Radiology Ct Head Wo Contrast  Result Date: 02/09/2017 CLINICAL DATA:  Slurred speech and blurred vision EXAM: CT HEAD WITHOUT CONTRAST TECHNIQUE: Contiguous axial images were obtained from the base of the skull through the vertex without intravenous contrast. COMPARISON:  Head CT February 19, 2012 and brain MRI February 20, 2012 FINDINGS: Brain: The ventricles are normal in size and configuration. There is no intracranial mass, hemorrhage, extra-axial fluid collection, or midline shift. Gray-white compartments appear normal. No acute infarct is appreciable. Vascular: No hyperdense vessel. There is slight calcification in each carotid siphon region. Skull: The bony calvarium appears intact. Sinuses/Orbits: There is mucosal  thickening in multiple ethmoid air cells. There is slight mucosal thickening in the posterior sphenoid sinus region. Other visualized paranasal sinuses are clear. Visualized orbits appear symmetric bilaterally. Other: Mastoid air cells are clear. IMPRESSION: No intracranial mass, hemorrhage, or extra-axial fluid collection. Gray-white compartments appear normal. There is mild arteriovascular calcification. There are areas of paranasal sinus disease. Electronically Signed   By: Lowella Grip III M.D.   On: 02/09/2017 14:16    Procedures Procedures (including critical care time)  Medications Ordered in ED Medications  aspirin chewable tablet 324 mg (324 mg Oral Given 02/09/17 1514)     Initial Impression / Assessment and Plan / ED Course  I have reviewed the triage vital signs and the nursing notes.  Pertinent labs & imaging results that were available during my care of the patient were  reviewed by me and considered in my medical decision making (see chart for details).    Discussed with Dr. Merlene Laughter. Will consult on patient. Dr Ernestina Patches will admit.   Final Clinical Impressions(s) / ED Diagnoses   Final diagnoses:  TIA (transient ischemic attack)    ED Discharge Orders    None       Julianne Rice, MD 02/09/17 1527

## 2017-02-09 NOTE — ED Notes (Signed)
Attempted to contact Pt's family, per Pt's request, to inform them of Pt's room change. No answer.

## 2017-02-09 NOTE — Telephone Encounter (Signed)
Pt called and states that on Saturday around 1400 or 1500 she was with her mother in law. Pt states that she began to feel light headed, headache, confused, and unable to form sentences. Pt states she did not inform her mother in law, or seek medical attention.  She did speak with her son in law who is a doctor at [employer]. He stated that she needs to be seen by someone now. Pt c/o headache in the temporal region today. Pt encouraged to be seen in the ER now. Pt states that she is currently cooking breakfast and that it will take her an hour to call her daughter and come to the ED. Pt reports that she called her PCP was also told go to ED. Will forward to Dr. Bronson Ing.

## 2017-02-09 NOTE — Telephone Encounter (Signed)
Spoke with patient stated that on Saturday she had symptoms of a TIA. Patient stated that she had a headache, felt light headed, and could not speak or formulate sentences. Patient stated that the symptoms lasted for about 15 minutes and then after sitting and not speaking she was able to talk again. Patient states she did not go to the ER but she did call her son in law whom is a doctor and was told that she needed to have symptoms looked at further. Patient also stated that yesterday she felt week and did not get out of the bed all day. Patient also stated that she is having headache in the temporal region. Spoke with Dr.Scott Luking and was told to advise patient to go to the ER for further work up if she feels that she may be having a stroke or if her headache is severe. Patient stated that her headache is still there and that she is still concerned of a stroke. Advised patient with her concerns that she needs to go to ER for further follow up and possible stroke and scans and blood work. Patient verbalized understanding and stated that she needed to think about symptoms and then she would decide whether or not to go to the ED.

## 2017-02-09 NOTE — ED Triage Notes (Signed)
Slurred speech,  Difficulty speaking, and blurred vision episode Saturday.   Has had similar episodes 3 times.

## 2017-02-09 NOTE — Plan of Care (Signed)
Pt is progressing 

## 2017-02-09 NOTE — ED Notes (Signed)
Pt going to rm 329. Report called and given to [facility].

## 2017-02-09 NOTE — Telephone Encounter (Signed)
Per phone call from pt--pt had a TIA on Saturday, she did not go to the Dr or ER

## 2017-02-09 NOTE — ED Notes (Signed)
Pt states she has had 3 previous episodes of aphasia lasting short in duration. Family present, and state the Pt has slurred speech and has difficulty expressing herself or understanding what is spoken to her during these episodes. Family states these episodes have lasted just a few minutes each. Pt has a history of high cholesterol and takes Zetia in the evenings for this condition. Pt's family believes there is a narrowing or blockage of blood in the Pt's brain.

## 2017-02-09 NOTE — ED Notes (Signed)
Patient transported to CT 

## 2017-02-09 NOTE — Telephone Encounter (Signed)
I am concerned about her symptoms. I agree that she will need neuroimaging and should be evaluated in the ED.

## 2017-02-10 ENCOUNTER — Telehealth: Payer: Self-pay | Admitting: Family Medicine

## 2017-02-10 ENCOUNTER — Observation Stay (HOSPITAL_BASED_OUTPATIENT_CLINIC_OR_DEPARTMENT_OTHER): Payer: Medicare HMO

## 2017-02-10 DIAGNOSIS — G459 Transient cerebral ischemic attack, unspecified: Secondary | ICD-10-CM

## 2017-02-10 DIAGNOSIS — R079 Chest pain, unspecified: Secondary | ICD-10-CM | POA: Diagnosis not present

## 2017-02-10 LAB — LIPID PANEL
Cholesterol: 152 mg/dL (ref 0–200)
HDL: 34 mg/dL — ABNORMAL LOW (ref >40)
LDL Cholesterol: 97 mg/dL (ref 0–99)
Total CHOL/HDL Ratio: 4.5 RATIO
Triglycerides: 106 mg/dL (ref <150)
VLDL: 21 mg/dL (ref 0–40)

## 2017-02-10 LAB — HEMOGLOBIN A1C
Hgb A1c MFr Bld: 5 % (ref 4.8–5.6)
Mean Plasma Glucose: 96.8 mg/dL

## 2017-02-10 LAB — ECHOCARDIOGRAM COMPLETE
Height: 62 in
Weight: 2080 oz

## 2017-02-10 MED ORDER — ASPIRIN 81 MG PO TABS: 81.0000 mg | ORAL_TABLET | Freq: Every day | ORAL | Status: DC

## 2017-02-10 NOTE — Evaluation (Signed)
Physical Therapy Evaluation Patient Details Name: Grace [name] MRN: [id number] DOB: February 04, 1946 Today's Date: 02/10/2017   History of Present Illness  Grace [name] is a 71 y.o. female with medical history significant of HLD, ? TIA, diastolic dysfunction  Presenting w/ TIA. Per pt and family, pt has had multiple episodes of slurred speech and blurred vision that last approx 15 minutes over last several months. No reported fevers or chills. No CP, SOB. No hemiparesis. Pt does not take aspirin daily. Last episode has been within the past week. Denies any head trauma. Though noted in her history, pt does not report any prior hx/o TIA in the past.     Clinical Impression  Patient functioning at baseline for functional mobility and gait.  Patient discharged from physical therapy to care of nursing for ambulation ad lib for length of stay.    Follow Up Recommendations No PT follow up    Equipment Recommendations  None recommended by PT    Recommendations for Other Services       Precautions / Restrictions Precautions Precautions: None Restrictions Weight Bearing Restrictions: No      Mobility  Bed Mobility Overal bed mobility: Independent                Transfers Overall transfer level: Independent                  Ambulation/Gait Ambulation/Gait assistance: Independent Ambulation Distance (Feet): 100 Feet Assistive device: None Gait Pattern/deviations: WFL(Within Functional Limits)        Stairs Stairs: Yes Stairs assistance: Modified independent (Device/Increase time) Stair Management: One rail Left Number of Stairs: 5 General stair comments: good return demonstrated for going up/down steps without loss of balance using 1 siderail  Wheelchair Mobility    Modified Rankin (Stroke Patients Only)       Balance Overall balance assessment: Independent                                           Pertinent Vitals/Pain Pain  Assessment: No/denies pain    Home Living Family/patient expects to be discharged to:: Private residence Living Arrangements: Alone   Type of Home: House Home Access: Stairs to enter Entrance Stairs-Rails: Right;Left;Can reach both Technical brewer of Steps: 2 Home Layout: Laundry or work area in [employer]: None      Prior Function Level of Independence: Independent               Hand Dominance   Dominant Hand: Right    Extremity/Trunk Assessment   Upper Extremity Assessment Upper Extremity Assessment: Defer to OT evaluation    Lower Extremity Assessment Lower Extremity Assessment: Overall WFL for tasks assessed    Cervical / Trunk Assessment Cervical / Trunk Assessment: Normal  Communication   Communication: No difficulties  Cognition Arousal/Alertness: Awake/alert Behavior During Therapy: WFL for tasks assessed/performed Overall Cognitive Status: Within Functional Limits for tasks assessed                                        General Comments      Exercises     Assessment/Plan    PT Assessment Patent does not need any further PT services  PT Problem List         PT Treatment  Interventions      PT Goals (Current goals can be found in the Care Plan section)  Acute Rehab PT Goals Patient Stated Goal: return home today PT Goal Formulation: With patient Time For Goal Achievement: 02/10/17 Potential to Achieve Goals: Good    Frequency     Barriers to discharge        Co-evaluation               AM-PAC PT "6 Clicks" Daily Activity  Outcome Measure Difficulty turning over in bed (including adjusting bedclothes, sheets and blankets)?: None Difficulty moving from lying on back to sitting on the side of the bed? : None Difficulty sitting down on and standing up from a chair with arms (e.g., wheelchair, bedside commode, etc,.)?: None Help needed moving to and from a bed to chair (including a  wheelchair)?: None Help needed walking in hospital room?: None Help needed climbing 3-5 steps with a railing? : None 6 Click Score: 24    End of Session   Activity Tolerance: Patient tolerated treatment well Patient left: in bed;with call bell/phone within reach Nurse Communication: Mobility status PT Visit Diagnosis: Unsteadiness on feet (R26.81);Other abnormalities of gait and mobility (R26.89);Muscle weakness (generalized) (M62.81)    Time: 9675-9163 PT Time Calculation (min) (ACUTE ONLY): 13 min   Charges:   PT Evaluation $PT Eval Low Complexity: 1 Low PT Treatments $Gait Training: 8-22 mins   PT G Codes:   PT G-Codes **NOT FOR INPATIENT CLASS** Functional Assessment Tool Used: AM-PAC 6 Clicks Basic Mobility Functional Limitation: Mobility: Walking and moving around Mobility: Walking and Moving Around Current Status (W4665): 0 percent impaired, limited or restricted Mobility: Walking and Moving Around Goal Status (L9357): 0 percent impaired, limited or restricted Mobility: Walking and Moving Around Discharge Status [id number]): 0 percent impaired, limited or restricted    1:57 PM, 02/10/17 Grace [name] Grandchild, MPT Physical Therapist with [facility] 336 [phone] office [phone] mobile phone

## 2017-02-10 NOTE — Progress Notes (Signed)
Grace [name] discharged Home per MD order.  Discharge instructions reviewed and discussed with the patient, all questions and concerns answered. Copy of instructions and scripts given to patient.  Allergies as of 02/10/2017      Reactions   Compazine Other (See Comments)   Slurred speech, unsure of other reaction   Statins Other (See Comments)   myalgias   Sulfa Drugs Cross Reactors Rash      Medication List    TAKE these medications   ALPRAZolam 0.25 MG tablet Commonly known as:  XANAX 1/2 or a whole twice a day   aspirin 81 MG tablet Take 1 tablet (81 mg total) daily by mouth. Start taking on:  02/12/2017   ezetimibe 10 MG tablet Commonly known as:  ZETIA Take 1 tablet (10 mg total) by mouth daily.   levothyroxine 75 MCG tablet Commonly known as:  SYNTHROID, LEVOTHROID TAKE 1 TABLET TUESDAY TO   SUNDAY AND 1/2 TABLET ON   MONDAY       Patients skin is clean, dry and intact, no evidence of skin break down. IV site discontinued and catheter remains intact. Site without signs and symptoms of complications. Dressing and pressure applied.  Patient escorted to car in a wheelchair,  no distress noted upon discharge.  Ralene Muskrat Josefine Fuhr 02/10/2017 4:55 PM

## 2017-02-10 NOTE — Care Management Obs Status (Signed)
[place] NOTIFICATION   Patient Details  Name: LUVERTA KORTE MRN: [id number] Date of Birth: 12/06/1945   Medicare Observation Status Notification Given:  Yes    Sherald Barge, RN 02/10/2017, 9:27 AM

## 2017-02-10 NOTE — Telephone Encounter (Signed)
Nurse's-patient recently discharged from the hospital. Please call patient, let them know that we are aware that they were discharged from the hospital. Please schedule them to follow-up with us within the next 7 days. Advised the patient to bring all of their medications with him to the visit. Please inquire if they are having any acute issues currently and documented accordingly.  

## 2017-02-10 NOTE — Evaluation (Signed)
Occupational Therapy Evaluation Patient Details Name: Grace [name] MRN: [id number] DOB: 1945/09/21 Today's Date: 02/10/2017    History of Present Illness Grace [name] is a 71 y.o. female with medical history significant of HLD, ? TIA, diastolic dysfunction  Presenting w/ TIA. Per pt and family, pt has had multiple episodes of slurred speech and blurred vision that last approx 15 minutes over last several months. No reported fevers or chills. No CP, SOB. No hemiparesis. Pt does not take aspirin daily. Last episode has been within the past week. Denies any head trauma. Though noted in her history, pt does not report any prior hx/o TIA in the past. MRI and CT negative for acute infarct   Clinical Impression   Pt agreeable to OT evaluation, reports symptoms resolved approximately 15 minutes after onset. Pt demonstrates independence in B/IADL completion and functional mobility. No further OT services required at this time.     Follow Up Recommendations  No OT follow up    Equipment Recommendations  None recommended by OT       Precautions / Restrictions Precautions Precautions: None Restrictions Weight Bearing Restrictions: No      Mobility Bed Mobility Overal bed mobility: Independent                Transfers Overall transfer level: Independent Equipment used: None                      ADL either performed or assessed with clinical judgement   ADL Overall ADL's : Modified independent;At baseline                                             Vision Baseline Vision/History: No visual deficits Patient Visual Report: No change from baseline Vision Assessment?: No apparent visual deficits            Pertinent Vitals/Pain Pain Assessment: No/denies pain     Hand Dominance Right   Extremity/Trunk Assessment Upper Extremity Assessment Upper Extremity Assessment: Overall WFL for tasks assessed   Lower Extremity Assessment Lower  Extremity Assessment: Defer to PT evaluation   Cervical / Trunk Assessment Cervical / Trunk Assessment: Normal   Communication Communication Communication: No difficulties   Cognition Arousal/Alertness: Awake/alert Behavior During Therapy: WFL for tasks assessed/performed Overall Cognitive Status: Within Functional Limits for tasks assessed                                                Home Living Family/patient expects to be discharged to:: Private residence Living Arrangements: Alone   Type of Home: House Home Access: Stairs to enter(no stairs under carport) Technical brewer of Steps: 3 Entrance Stairs-Rails: Right;Left;Can reach both Home Layout: Laundry or work area in basement     [employer] Shower/Tub: Teacher, early years/pre: Standard     Home Equipment: None          Prior Functioning/Environment Level of Independence: Independent                 OT Problem List: Decreased activity tolerance       AM-PAC PT "6 Clicks" Daily Activity     Outcome Measure Help from another person eating meals?: None Help from another person  taking care of personal grooming?: None Help from another person toileting, which includes using toliet, bedpan, or urinal?: None Help from another person bathing (including washing, rinsing, drying)?: None Help from another person to put on and taking off regular upper body clothing?: None Help from another person to put on and taking off regular lower body clothing?: None 6 Click Score: 24   End of Session    Activity Tolerance: Patient tolerated treatment well Patient left: in bed;with call bell/phone within reach  OT Visit Diagnosis: Other symptoms and signs involving cognitive function                Time: 6144-3154 OT Time Calculation (min): 19 min Charges:  OT General Charges $OT Visit: 1 Visit OT Evaluation $OT Eval Low Complexity: 1 Low G-Codes: OT G-codes **NOT FOR INPATIENT  CLASS** Functional Assessment Tool Used: AM-PAC 6 Clicks Daily Activity Functional Limitation: Self care Self Care Current Status (M0867): 0 percent impaired, limited or restricted Self Care Goal Status (Y1950): 0 percent impaired, limited or restricted Self Care Discharge Status (D3267): 0 percent impaired, limited or restricted   Guadelupe Sabin, OTR/L  [phone] 02/10/2017, 8:10 AM

## 2017-02-10 NOTE — Discharge Instructions (Signed)
PLEASE FOLLOW UP YOUR ECHOCARDIOGRAM RESULTS WHEN YOU SEE YOUR PRIMARY PHYSICIAN NEXT WEEK   Follow with Primary MD  Kathyrn Drown, MD  and other consultant's as instructed your Hospitalist MD  Please get a complete blood count and chemistry panel checked by your Primary MD at your next visit, and again as instructed by your Primary MD.  Get Medicines reviewed and adjusted: Please take all your medications with you for your next visit with your Primary MD  Laboratory/radiological data: Please request your Primary MD to go over all hospital tests and procedure/radiological results at the follow up, please ask your Primary MD to get all Hospital records sent to his/her office.  In some cases, they will be blood work, cultures and biopsy results pending at the time of your discharge. Please request that your primary care M.D. follows up on these results.  Also Note the following: If you experience worsening of your admission symptoms, develop shortness of breath, life threatening emergency, suicidal or homicidal thoughts you must seek medical attention immediately by calling 911 or calling your MD immediately  if symptoms less severe.  You must read complete instructions/literature along with all the possible adverse reactions/side effects for all the Medicines you take and that have been prescribed to you. Take any new Medicines after you have completely understood and accpet all the possible adverse reactions/side effects.   Do not drive when taking Pain medications or sleeping medications (Benzodaizepines)  Do not take more than prescribed Pain, Sleep and Anxiety Medications. It is not advisable to combine anxiety,sleep and pain medications without talking with your primary care practitioner  Special Instructions: If you have smoked or chewed Tobacco  in the last 2 yrs please stop smoking, stop any regular Alcohol  and or any Recreational drug use.  Wear Seat belts while  driving.  Please note: You were cared for by a hospitalist during your hospital stay. Once you are discharged, your primary care physician will handle any further medical issues. Please note that NO REFILLS for any discharge medications will be authorized once you are discharged, as it is imperative that you return to your primary care physician (or establish a relationship with a primary care physician if you do not have one) for your post hospital discharge needs so that they can reassess your need for medications and monitor your lab values.

## 2017-02-10 NOTE — Progress Notes (Signed)
*  PRELIMINARY RESULTS* Echocardiogram 2D Echocardiogram has been performed.  Leavy Cella 02/10/2017, 11:05 AM

## 2017-02-10 NOTE — Discharge Summary (Signed)
Physician Discharge Summary  Grace [name] FGH:[id number] DOB: [birth year] DOA: 02/09/2017  PCP: Kathyrn Drown, MD  Admit date: 02/09/2017 Discharge date: 02/10/2017  Admitted From: Home  Disposition: Home   Recommendations for Outpatient Follow-up:  1. Follow up with PCP in 1 weeks 2. Follow up with Dr. Merlene Laughter in 4-6 weeks 3. Follow up with cardiologist in 2-4 weeks 4. Take aspirin 81 mg daily for stroke prevention antiplatelet therapy 5. Please obtain BMP/CBC in one week 6. Please follow up on the following pending results: 2D echocardiogram results pending at discharge  Discharge Condition: STABLE   CODE STATUS: FULL    Brief Hospitalization Summary: Please see all hospital notes, images, labs for full details of the hospitalization.  HPI: Grace [name] is a 71 y.o. female with medical history significant of HLD, ? TIA, diastolic dysfunction  Presenting w/ TIA. Per pt and family, pt has had multiple episodes of slurred speech and blurred vision that last approx 15 minutes over last several months. No reported fevers or chills. No CP, SOB. No hemiparesis. Pt does not take aspirin daily. Last episode has been within the past week. Denies any head trauma. Though noted in her history, pt does not report any prior hx/o TIA in the past.   ED Course: Presented to ER afebrile, hemodynamically stable. CXR normal. CT head WNL.   1-Question of TIA -recurrent sxs over last few months per report;   -noted admission for similar 01/2012  -started on ASA 81 mg daily for stroke prevention antiplatelet therapy -MRI, MRA, carotid dopplers negative for acute or significant findings.   -2D ECHO pending AT TIME OF DISCHARGE Pt to follow up with PCP to get results next week.  - A1c 5.0% -LDL - down to 97, TC 152, HDL 34  - PT eval no PT follow up recommended -Follow up with PCP and neurologist Dr. Merlene Laughter outpatient -Pt wanted to go home and didn't want to wait on final echo report says she  will follow up results with PCP.    2-Hypothyroidism - TSH within normal limits  DVT prophylaxis: lovenox  Code Status: Full Code   Family Communication: Family at bedside   Disposition Plan: Home  Consults called: [place]   Admission status: Obs   Discharge Diagnoses:  Active Problems:   TIA (transient ischemic attack)  Discharge Instructions:  Allergies as of 02/10/2017      Reactions   Compazine Other (See Comments)   Slurred speech, unsure of other reaction   Statins Other (See Comments)   myalgias   Sulfa Drugs Cross Reactors Rash      Medication List    TAKE these medications   ALPRAZolam 0.25 MG tablet Commonly known as:  XANAX 1/2 or a whole twice a day   aspirin 81 MG tablet Take 1 tablet (81 mg total) daily by mouth. Start taking on:  02/12/2017   ezetimibe 10 MG tablet Commonly known as:  ZETIA Take 1 tablet (10 mg total) by mouth daily.   levothyroxine 75 MCG tablet Commonly known as:  SYNTHROID, LEVOTHROID TAKE 1 TABLET TUESDAY TO   SUNDAY AND 1/2 TABLET ON   MONDAY      Follow-up Information    [name], Grace Snare, MD. Schedule an appointment as soon as possible for a visit in 1 week(s).   Specialty:  Family Medicine Why:  Hospital Follow Up  Contact information: [address] [place] [address] [phone]        Merlene Laughter,  Kofi, MD. Schedule an appointment as soon as possible for a visit in 6 week(s).   Specialty:  Neurology Why:  Hospital Follow Up [facility] Contact information: [address] [name] [address] [phone]          Allergies  Allergen Reactions  . Compazine Other (See Comments)    Slurred speech, unsure of other reaction  . Statins Other (See Comments)    myalgias  . Sulfa Drugs Cross Reactors Rash   Current Discharge Medication List    START taking these medications   Details  aspirin 81 MG tablet Take 1 tablet (81 mg total) daily by mouth.      CONTINUE these medications  which have NOT CHANGED   Details  ALPRAZolam (XANAX) 0.25 MG tablet 1/2 or a whole twice a day Qty: 30 tablet, Refills: 0    ezetimibe (ZETIA) 10 MG tablet Take 1 tablet (10 mg total) by mouth daily. Qty: 90 tablet, Refills: 3    levothyroxine (SYNTHROID, LEVOTHROID) 75 MCG tablet TAKE 1 TABLET TUESDAY TO   SUNDAY AND 1/2 TABLET ON   MONDAY Qty: 88 tablet, Refills: 1       Procedures/Studies: Dg Chest 2 View  Result Date: 02/09/2017 CLINICAL DATA:  TIA EXAM: CHEST  2 VIEW COMPARISON:  08/22/2014 FINDINGS: Normal heart size and mediastinal contours. Artifact from EKG leads. No acute infiltrate or edema. No effusion or pneumothorax. No acute osseous findings. Cholecystectomy clips. IMPRESSION: Negative chest. Electronically Signed   By: Monte Fantasia M.D.   On: 02/09/2017 16:29   Ct Head Wo Contrast  Result Date: 02/09/2017 CLINICAL DATA:  Slurred speech and blurred vision EXAM: CT HEAD WITHOUT CONTRAST TECHNIQUE: Contiguous axial images were obtained from the base of the skull through the vertex without intravenous contrast. COMPARISON:  Head CT February 19, 2012 and brain MRI February 20, 2012 FINDINGS: Brain: The ventricles are normal in size and configuration. There is no intracranial mass, hemorrhage, extra-axial fluid collection, or midline shift. Gray-white compartments appear normal. No acute infarct is appreciable. Vascular: No hyperdense vessel. There is slight calcification in each carotid siphon region. Skull: The bony calvarium appears intact. Sinuses/Orbits: There is mucosal thickening in multiple ethmoid air cells. There is slight mucosal thickening in the posterior sphenoid sinus region. Other visualized paranasal sinuses are clear. Visualized orbits appear symmetric bilaterally. Other: Mastoid air cells are clear. IMPRESSION: No intracranial mass, hemorrhage, or extra-axial fluid collection. Gray-white compartments appear normal. There is mild arteriovascular calcification.  There are areas of paranasal sinus disease. Electronically Signed   By: Lowella Grip III M.D.   On: 02/09/2017 14:16   Mr Brain Wo Contrast  Result Date: 02/09/2017 CLINICAL DATA:  Slurred speech. Blurred vision episode on Sunday. Similar episodes have occurred 3 previous times. EXAM: MRI HEAD WITHOUT CONTRAST MRA HEAD WITHOUT CONTRAST TECHNIQUE: Multiplanar, multiecho pulse sequences of the brain and surrounding structures were obtained without intravenous contrast. Angiographic images of the head were obtained using MRA technique without contrast. COMPARISON:  02/20/2012 FINDINGS: MRI HEAD FINDINGS Brain: No acute infarction, hemorrhage, hydrocephalus, extra-axial collection or mass lesion. Few FLAIR hyperintensities in the cerebral white matter, age congruent and stable from 2013. Normal brain volume. Vascular: Arterial findings below. Normal dural venous sinus flow voids. Skull and upper cervical spine: Negative for marrow lesion. Sinuses/Orbits: Bilateral cataract resection. No compressive or inflammatory changes in the orbits to explain blurred vision episode. Negative sinuses. Other: Intermittent motion degradation. MRA HEAD FINDINGS There is mild to moderate narrowing at  the anterior genu left ICA that is likely accentuated by flow artifact in this location stenosis measures up to 50% in some projections. There is atherosclerotic calcification of the carotid siphons by preceding CT. No branch occlusion, beading, or flow limiting stenosis when accounting for levels of symmetric artifactual signal drop. Negative for aneurysm. Vertebral and basilar arteries are smooth and widely patent. IMPRESSION: Brain MRI: No acute finding. Unremarkable study for age that is stable from 2013. Intracranial MRA: 1.  No acute finding or branch occlusion. 2. Mild to moderate narrowing at the anterior genu left cavernous carotid where there is atherosclerotic calcification by CT. Electronically Signed   By: Monte Fantasia M.D.   On: 02/09/2017 16:28   US Carotid Bilateral (at [facility] Only)  Result Date: 02/09/2017 CLINICAL DATA:  Expressive aphasia, blurred vision, stroke symptoms EXAM: BILATERAL CAROTID DUPLEX ULTRASOUND TECHNIQUE: Pearline Cables scale imaging, color Doppler and duplex ultrasound were performed of bilateral carotid and vertebral arteries in the neck. COMPARISON:  02/09/2017 FINDINGS: Criteria: Quantification of carotid stenosis is based on velocity parameters that correlate the residual internal carotid diameter with NASCET-based stenosis levels, using the diameter of the distal internal carotid lumen as the denominator for stenosis measurement. The following velocity measurements were obtained: RIGHT ICA:  108/25 cm/sec CCA:  43/3 cm/sec SYSTOLIC ICA/CCA RATIO:  1.3 DIASTOLIC ICA/CCA RATIO:  3.3 ECA:  89 cm/sec LEFT ICA:  91/20 cm/sec CCA:  29/51 cm/sec SYSTOLIC ICA/CCA RATIO:  1.2 DIASTOLIC ICA/CCA RATIO:  2.4 ECA:  70 cm/sec RIGHT CAROTID ARTERY: Minor echogenic shadowing plaque formation. No hemodynamically significant right ICA stenosis, velocity elevation, or turbulent flow. Degree of narrowing less than 50%. RIGHT VERTEBRAL ARTERY:  Antegrade LEFT CAROTID ARTERY: Similar scattered minor echogenic plaque formation. No hemodynamically significant left ICA stenosis, velocity elevation, or turbulent flow. LEFT VERTEBRAL ARTERY:  Antegrade IMPRESSION: Minor carotid atherosclerosis. No hemodynamically significant ICA stenosis. Degree of narrowing less than 50% bilaterally by ultrasound criteria. Patent antegrade vertebral flow bilaterally Electronically Signed   By: Jerilynn Mages.  Shick M.D.   On: 02/09/2017 16:59   Mr Jodene Nam Head/brain OA Cm  Result Date: 02/09/2017 CLINICAL DATA:  Slurred speech. Blurred vision episode on Sunday. Similar episodes have occurred 3 previous times. EXAM: MRI HEAD WITHOUT CONTRAST MRA HEAD WITHOUT CONTRAST TECHNIQUE: Multiplanar, multiecho pulse sequences of the brain and surrounding  structures were obtained without intravenous contrast. Angiographic images of the head were obtained using MRA technique without contrast. COMPARISON:  02/20/2012 FINDINGS: MRI HEAD FINDINGS Brain: No acute infarction, hemorrhage, hydrocephalus, extra-axial collection or mass lesion. Few FLAIR hyperintensities in the cerebral white matter, age congruent and stable from 2013. Normal brain volume. Vascular: Arterial findings below. Normal dural venous sinus flow voids. Skull and upper cervical spine: Negative for marrow lesion. Sinuses/Orbits: Bilateral cataract resection. No compressive or inflammatory changes in the orbits to explain blurred vision episode. Negative sinuses. Other: Intermittent motion degradation. MRA HEAD FINDINGS There is mild to moderate narrowing at the anterior genu left ICA that is likely accentuated by flow artifact in this location stenosis measures up to 50% in some projections. There is atherosclerotic calcification of the carotid siphons by preceding CT. No branch occlusion, beading, or flow limiting stenosis when accounting for levels of symmetric artifactual signal drop. Negative for aneurysm. Vertebral and basilar arteries are smooth and widely patent. IMPRESSION: Brain MRI: No acute finding. Unremarkable study for age that is stable from 2013. Intracranial MRA: 1.  No acute finding or branch occlusion. 2. Mild to moderate  narrowing at the anterior genu left cavernous carotid where there is atherosclerotic calcification by CT. Electronically Signed   By: Monte Fantasia M.D.   On: 02/09/2017 16:28      Subjective: Pt much better, symptoms completely resolved. Wants to go home.   Discharge Exam: Vitals:   02/10/17 0200 02/10/17 0643  BP: 118/62 (!) 110/57  Pulse: 62 60  Resp: 16 16  Temp: 98 F (36.7 C) (!) 97.4 F (36.3 C)  SpO2: 99% 100%   Vitals:   02/09/17 2122 02/09/17 2315 02/10/17 0200 02/10/17 0643  BP:  128/68 118/62 (!) 110/57  Pulse:  60 62 60  Resp:  16  16 16   Temp:  98.5 F (36.9 C) 98 F (36.7 C) (!) 97.4 F (36.3 C)  TempSrc:  Oral Oral Oral  SpO2: 95% 96% 99% 100%  Weight:      Height:       General: Pt is alert, awake, not in acute distress Cardiovascular: RRR, S1/S2 +, no rubs, no gallops Respiratory: CTA bilaterally, no wheezing, no rhonchi Abdominal: Soft, NT, ND, bowel sounds + Extremities: no edema, no cyanosis   The results of significant diagnostics from this hospitalization (including imaging, microbiology, ancillary and laboratory) are listed below for reference.     Microbiology: No results found for this or any previous visit (from the past 240 hour(s)).   Labs: BNP (last 3 results) No results for input(s): BNP in the last 8760 hours. Basic Metabolic Panel: Recent Labs  Lab 02/09/17 1257  NA 136  K 4.3  CL 100*  CO2 30  GLUCOSE 85  BUN 10  CREATININE 0.73  CALCIUM 9.7   Liver Function Tests: Recent Labs  Lab 02/09/17 1257  AST 23  ALT 17  ALKPHOS 54  BILITOT 1.1  PROT 7.9  ALBUMIN 4.5   No results for input(s): LIPASE, AMYLASE in the last 168 hours. No results for input(s): AMMONIA in the last 168 hours. CBC: Recent Labs  Lab 02/09/17 1257  WBC 7.4  NEUTROABS 5.1  HGB 14.4  HCT 43.6  MCV 90.5  PLT 186   Cardiac Enzymes: No results for input(s): CKTOTAL, CKMB, CKMBINDEX, TROPONINI in the last 168 hours. BNP: Invalid input(s): POCBNP CBG: No results for input(s): GLUCAP in the last 168 hours. D-Dimer No results for input(s): DDIMER in the last 72 hours. Hgb A1c Recent Labs    02/10/17 0536  HGBA1C 5.0   Lipid Profile Recent Labs    02/10/17 0536  CHOL 152  HDL 34*  LDLCALC 97  TRIG 106  CHOLHDL 4.5   Thyroid function studies Recent Labs    02/09/17 1330  TSH 2.192   Anemia work up No results for input(s): VITAMINB12, FOLATE, FERRITIN, TIBC, IRON, RETICCTPCT in the last 72 hours. Urinalysis    Component Value Date/Time   COLORURINE STRAW (A) 02/09/2017 1240    APPEARANCEUR CLEAR 02/09/2017 1240   LABSPEC <1.005 (L) 02/09/2017 1240   PHURINE 6.5 02/09/2017 1240   GLUCOSEU NEGATIVE 02/09/2017 1240   HGBUR NEGATIVE 02/09/2017 1240   BILIRUBINUR NEGATIVE 02/09/2017 1240   KETONESUR NEGATIVE 02/09/2017 1240   PROTEINUR NEGATIVE 02/09/2017 1240   UROBILINOGEN 0.2 02/20/2012 0830   NITRITE NEGATIVE 02/09/2017 1240   LEUKOCYTESUR NEGATIVE 02/09/2017 1240   Sepsis Labs Invalid input(s): PROCALCITONIN,  WBC,  LACTICIDVEN Microbiology No results found for this or any previous visit (from the past 240 hour(s)).  Time coordinating discharge:   SIGNED:  Irwin Brakeman, MD  [facility]  02/10/2017, 2:33 PM Pager [phone]  If 7PM-7AM, please contact night-coverage [email or web address] Password [email or web address]

## 2017-02-11 ENCOUNTER — Other Ambulatory Visit: Payer: Self-pay | Admitting: *Deleted

## 2017-02-11 MED ORDER — LEVOTHYROXINE SODIUM 75 MCG PO TABS: ORAL_TABLET | ORAL | 1 refills | Status: DC

## 2017-02-11 NOTE — Telephone Encounter (Signed)
Patient stated she see cardiology on November 19th and scheduled a follow up office visit with [place] for next week , after she sees the cardiologist. Patient advised to bring medications to the appt. And verbalized understanding.

## 2017-02-16 ENCOUNTER — Ambulatory Visit: Payer: Medicare HMO | Admitting: Cardiovascular Disease

## 2017-02-16 ENCOUNTER — Encounter: Payer: Self-pay | Admitting: Cardiovascular Disease

## 2017-02-16 VITALS — BP 124/60 | HR 60 | Ht 63.0 in | Wt 131.0 lb

## 2017-02-16 DIAGNOSIS — Z9289 Personal history of other medical treatment: Secondary | ICD-10-CM | POA: Diagnosis not present

## 2017-02-16 DIAGNOSIS — R209 Unspecified disturbances of skin sensation: Secondary | ICD-10-CM

## 2017-02-16 DIAGNOSIS — E785 Hyperlipidemia, unspecified: Secondary | ICD-10-CM

## 2017-02-16 DIAGNOSIS — R002 Palpitations: Secondary | ICD-10-CM

## 2017-02-16 DIAGNOSIS — R252 Cramp and spasm: Secondary | ICD-10-CM | POA: Diagnosis not present

## 2017-02-16 DIAGNOSIS — G459 Transient cerebral ischemic attack, unspecified: Secondary | ICD-10-CM | POA: Diagnosis not present

## 2017-02-16 DIAGNOSIS — R079 Chest pain, unspecified: Secondary | ICD-10-CM | POA: Diagnosis not present

## 2017-02-16 NOTE — Patient Instructions (Signed)
Your physician recommends that you schedule a follow-up appointment in:  3 months with [place]     Your physician recommends that you continue on your current medications as directed. Please refer to the Current Medication list given to you today.    If you need a refill on your cardiac medications before your next appointment, please call your pharmacy.   No lab work or tests ordered today    Let us know if you decide to wear event monitor.      Thank you for choosing [facility] !

## 2017-02-16 NOTE — Progress Notes (Signed)
SUBJECTIVE: The patient returns for follow-up after undergoing cardiovascular testing performed for the evaluation of chest pain.  Nuclear stress test demonstrated horizontal ST segment depressions during stress in II, III, aVF, V4, V5 and V6 leads.  However, nuclear myocardial perfusion imaging did not demonstrate any evidence of infarction or ischemia.  It was a low risk study.  There was a hypertensive response to exercise.  Since her last visit with me, she developed slurring of speech and blurry vision lasting 15 minutes.  She was hospitalized with a question of TIA.  She was started on aspirin.  MRI, MRA, carotid Dopplers (minor atherosclerosis) were negative for acute findings.  Echocardiogram showed normal LV systolic function and regional wall motion, LVEF 60-65%, with grade 1 diastolic dysfunction. No PFO was seen.  She denies any further episodes of chest pain.  She has been having palpitations as frequently as once per week.  They can occur after she has been walking from one room to the next or when she is sitting down doing nothing.  ECG performed on 02/09/17 which I personally interpreted demonstrated sinus rhythm with old anteroseptal infarct pattern.    Review of Systems: As per "subjective", otherwise negative.  Allergies  Allergen Reactions  . Compazine Other (See Comments)    Slurred speech, unsure of other reaction  . Statins Other (See Comments)    myalgias  . Sulfa Drugs Cross Reactors Rash    Current Outpatient Medications  Medication Sig Dispense Refill  . ALPRAZolam (XANAX) 0.25 MG tablet 1/2 or a whole twice a day 30 tablet 0  . aspirin 81 MG tablet Take 1 tablet (81 mg total) daily by mouth.    . ezetimibe (ZETIA) 10 MG tablet Take 1 tablet (10 mg total) by mouth daily. 90 tablet 3  . levothyroxine (SYNTHROID, LEVOTHROID) 75 MCG tablet TAKE 1 TABLET TUESDAY TO   SUNDAY AND 1/2 TABLET ON   MONDAY 88 tablet 1   No current facility-administered  medications for this visit.     Past Medical History:  Diagnosis Date  . Cancer (HCC)    Skin Cancer, Squamous Cell  . Diastolic dysfunction 81/85/6314   Ejection fraction 75%.  . Dry eyes, bilateral   . Family history of anesthesia complication    Mother was hard to wake up after anesthesia  . Frequency of urination   . Heart murmur   . Hyperlipidemia    Diet controlled  . Hypothyroid   . Migraine 2013  . Mitral valve prolapse   . TIA (transient ischemic attack) 02/19/2012    Past Surgical History:  Procedure Laterality Date  . BALLOON DILATION N/A 07/28/2013   Performed by Rogene Houston, MD at [facility]  . BREAST BIOPSY     left benign tumor  . CATARACT EXTRACTION PHACO AND INTRAOCULAR LENS PLACEMENT ([place]) Left 08/04/2013   Performed by Tonny Branch, MD at [facility]  . CATARACT EXTRACTION PHACO AND INTRAOCULAR LENS PLACEMENT ([place]) Right 07/11/2013   Performed by Tonny Branch, MD at [facility]  . CHOLECYSTECTOMY    . COLONOSCOPY N/A 03/06/2016   Performed by Rogene Houston, MD at [facility]  . COLONOSCOPY N/A 12/18/2010   Performed by Rogene Houston, MD at [facility]  . ESOPHAGOGASTRODUODENOSCOPY    . ESOPHAGOGASTRODUODENOSCOPY (EGD) N/A 07/28/2013   Performed by Rogene Houston, MD at [facility]  . MALONEY DILATION N/A 07/28/2013   Performed by Rogene Houston,  MD at [facility]  . SAVORY DILATION N/A 07/28/2013   Performed by Rogene Houston, MD at [facility] Left 11/20/2011   Performed by Izora Gala, MD at [facility]  . TUBAL LIGATION     38 yrs ago.    Social History   Socioeconomic History  . Marital status: Widowed    Spouse name: Not on file  . Number of children: Not on file  . Years of education: Not on file  . Highest education level: Not on file  Social Needs  . Financial resource strain: Not on file  . Food insecurity - worry: Not on file  . Food insecurity - inability: Not on file  . Transportation needs - medical: Not  on file  . Transportation needs - non-medical: Not on file  Occupational History  . Not on file  Tobacco Use  . Smoking status: Never Smoker  . Smokeless tobacco: Never Used  Substance and Sexual Activity  . Alcohol use: No    Alcohol/week: 0.0 oz  . Drug use: No  . Sexual activity: Not Currently  Other Topics Concern  . Not on file  Social History Narrative  . Not on file     Vitals:   02/16/17 1419  BP: 124/60  Pulse: 60  SpO2: 98%  Weight: 131 lb (59.4 kg)  Height: 5\' 3"  (1.6 m)    Wt Readings from Last 3 Encounters:  02/16/17 131 lb (59.4 kg)  02/09/17 130 lb (59 kg)  12/26/16 130 lb (59 kg)     PHYSICAL EXAM General: NAD HEENT: Normal. Neck: No JVD, no thyromegaly. Lungs: Clear to auscultation bilaterally with normal respiratory effort. CV: Regular rate and rhythm, normal S1/S2, no S3/S4, no murmur. No pretibial or periankle edema.  No carotid bruit.   Abdomen: Soft, nontender, no distention.  Neurologic: Alert and oriented.  Psych: Normal affect. Skin: Normal. Musculoskeletal: No gross deformities.    ECG: Most recent ECG reviewed.   Labs: Lab Results  Component Value Date/Time   K 4.3 02/09/2017 12:57 PM   BUN 10 02/09/2017 12:57 PM   BUN 10 12/23/2016 10:14 AM   CREATININE 0.73 02/09/2017 12:57 PM   CREATININE 0.91 03/12/2016 08:57 AM   ALT 17 02/09/2017 12:57 PM   TSH 2.192 02/09/2017 01:30 PM   TSH 1.780 12/23/2016 10:14 AM   HGB 14.4 02/09/2017 12:57 PM   HGB 12.9 06/05/2015 09:50 AM     Lipids: Lab Results  Component Value Date/Time   LDLCALC 97 02/10/2017 05:36 AM   LDLCALC 163 (H) 08/07/2016 08:32 AM   CHOL 152 02/10/2017 05:36 AM   CHOL 230 (H) 08/07/2016 08:32 AM   TRIG 106 02/10/2017 05:36 AM   HDL 34 (L) 02/10/2017 05:36 AM   HDL 42 08/07/2016 08:32 AM       ASSESSMENT AND PLAN:  1. Chest pressure: No further episodes of chest pain.  Nuclear stress test was low risk.  There were ECG abnormalities with stress and she  did develop a hypertensive response to exercise.  Myocardial perfusion imaging was normal.  2. Left calf cramps with cold feet: By physical examination, she has good arterial flow with normal pulses. I have recommended some home remedies.  3. Hyperlipidemia: Lipids markedly elevated as previously reviewed. She is statin intolerant. I started Zetia 10 mg daily at her last visit. Lipids 02/10/17-total cholesterol 152, try glycerides 106, HDL 34, LDL 97.  4.  Question of TIA: She is  now on aspirin.  She is also on Zetia as she is statin intolerant.  I will obtain an event monitor if she is willing to wear 1 to evaluate for atrial fibrillation.  5.  Palpitations: She continues to experience these.  Given her question of TIA, I would like to order an event monitor to evaluate for atrial fibrillation.  She would like to think about it.    Disposition: Follow up 3 months   Kate Sable, M.D., F.A.C.C.

## 2017-02-17 ENCOUNTER — Encounter: Payer: Self-pay | Admitting: Family Medicine

## 2017-02-17 ENCOUNTER — Telehealth: Payer: Self-pay | Admitting: Cardiovascular Disease

## 2017-02-17 ENCOUNTER — Ambulatory Visit: Payer: Medicare HMO | Admitting: Family Medicine

## 2017-02-17 VITALS — BP 130/72 | Ht 63.0 in | Wt 131.1 lb

## 2017-02-17 DIAGNOSIS — R51 Headache: Secondary | ICD-10-CM | POA: Diagnosis not present

## 2017-02-17 DIAGNOSIS — R519 Headache, unspecified: Secondary | ICD-10-CM

## 2017-02-17 DIAGNOSIS — G459 Transient cerebral ischemic attack, unspecified: Secondary | ICD-10-CM

## 2017-02-17 NOTE — Telephone Encounter (Signed)
Pt called states that she is wanting to wear the 30 day event monitor. Alda Berthold agrees to put it on her tomorrow. Informed Dr. Bronson Ing.

## 2017-02-17 NOTE — Progress Notes (Signed)
   Subjective:    Patient ID: Grace [name], female    DOB: 1945-08-20, 71 y.o.   MRN: [id number]  HPI Patient in today for a hospital follow up for a TIA. Patient was admitted on 02/09/17. States she is still having headaches in temporal region. Patient with moderate TIA Was evaluated in the ER admitted overnight testing overall came back looking good Patient is on medication to try to help control cholesterol she does not tolerate statins Patient has had some intermittent palpitations unknown if this could be atrial fibrillation Lab work MRIs all reviewed with patient Patiently recently in the hospital.  Here for follow-up.  Will need coordination of care Review of Systems  Constitutional: Negative for activity change and appetite change.  HENT: Negative for congestion.   Respiratory: Negative for cough.   Cardiovascular: Negative for chest pain.  Gastrointestinal: Negative for abdominal pain and vomiting.  Skin: Negative for color change.  Neurological: Negative for weakness.  Psychiatric/Behavioral: Negative for confusion.       Objective:   Physical Exam  Constitutional: She appears well-developed and well-nourished. No distress.  HENT:  Head: Normocephalic and atraumatic.  Eyes: Right eye exhibits no discharge. Left eye exhibits no discharge.  Neck: No tracheal deviation present.  Cardiovascular: Normal rate, regular rhythm and normal heart sounds.  No murmur heard. Pulmonary/Chest: Effort normal and breath sounds normal. No respiratory distress. She has no wheezes. She has no rales.  Musculoskeletal: She exhibits no edema.  Lymphadenopathy:    She has no cervical adenopathy.  Neurological: She is alert. She exhibits normal muscle tone.  Skin: Skin is warm and dry. No erythema.  Psychiatric: Her behavior is normal.  Vitals reviewed.  Patient with mild temporal headache no tenderness we will check a sed rate       Assessment & Plan:  Transitional care Referral  for neurology per patient request I recommend the patient call cardiology to set up an event monitor for 30 days Patient was educated regarding if she has strokelike symptoms to go to ER right away 81 mg aspirin daily Follow-up here within 3-4 months.

## 2017-02-17 NOTE — Telephone Encounter (Signed)
Patient would like return phone call to discuss heart monitor / tg

## 2017-02-18 ENCOUNTER — Other Ambulatory Visit: Payer: Self-pay | Admitting: *Deleted

## 2017-02-18 ENCOUNTER — Ambulatory Visit (INDEPENDENT_AMBULATORY_CARE_PROVIDER_SITE_OTHER): Payer: Medicare HMO

## 2017-02-18 DIAGNOSIS — G459 Transient cerebral ischemic attack, unspecified: Secondary | ICD-10-CM | POA: Diagnosis not present

## 2017-02-18 DIAGNOSIS — I480 Paroxysmal atrial fibrillation: Secondary | ICD-10-CM | POA: Diagnosis not present

## 2017-02-18 DIAGNOSIS — R002 Palpitations: Secondary | ICD-10-CM | POA: Diagnosis not present

## 2017-02-18 DIAGNOSIS — R51 Headache: Secondary | ICD-10-CM | POA: Diagnosis not present

## 2017-02-19 LAB — SEDIMENTATION RATE: Sed Rate: 2 mm/hr (ref 0–40)

## 2017-02-24 ENCOUNTER — Encounter: Payer: Self-pay | Admitting: Family Medicine

## 2017-03-03 ENCOUNTER — Encounter: Payer: Self-pay | Admitting: Diagnostic Neuroimaging

## 2017-03-03 ENCOUNTER — Ambulatory Visit: Payer: Medicare HMO | Admitting: Neurology

## 2017-03-03 ENCOUNTER — Ambulatory Visit: Payer: Medicare HMO | Admitting: Diagnostic Neuroimaging

## 2017-03-03 VITALS — BP 127/63 | HR 61 | Ht 62.5 in | Wt 132.2 lb

## 2017-03-03 DIAGNOSIS — G43109 Migraine with aura, not intractable, without status migrainosus: Secondary | ICD-10-CM | POA: Diagnosis not present

## 2017-03-03 DIAGNOSIS — G459 Transient cerebral ischemic attack, unspecified: Secondary | ICD-10-CM

## 2017-03-03 NOTE — Progress Notes (Signed)
[facility]  PATIENT: Grace [name] DOB: [birth year]  REFERRING CLINICIAN: Lance Sell, MD HISTORY FROM: patient  REASON FOR VISIT: new consult    HISTORICAL  CHIEF COMPLAINT:  Chief Complaint  Patient presents with  . Transient Ischemic Attack    rm 6, New Pt, dgtr- Sissy, " 3 episodes of speech difficulty, new onset of headaches"  . Headache    HISTORY OF PRESENT ILLNESS:   71 year old female here for evaluation of abnormal spells.  Patient has history of hypercholesterolemia.  November 2013 patient had admission for possible TIA.  She had blurred vision, slurred speech, word finding difficulties.  Stroke workup was completed.  She was found to have left ICA intracranial stenosis on MRA, but follow-up cerebral angiogram showed no evidence of stenosis, and therefore was felt to be artifactual.  Patient was treated medically.  Patient was evaluated at [facility] neurology in June 2014, for abnormal visual changes, speech difficulty and headaches.  She was diagnosed with migraine aura without headache.  In 2018 patient had recurrence of symptoms.  She went to the hospital in November 2018 for blurred vision, slurred speech, word finding difficulties.  Symptoms lasted for 10 minutes.  Then she had a headache bilaterally which lasted for approximately 10 minutes.  She had a milder episode following that.  She was admitted to the hospital for TIA evaluation.  She was found to have similar left ICA cavernous carotid narrowing on MRA, in the area of atherosclerotic calcification on CT.  Stroke workup was completed.  Patient was treated medically.  Since that time patient is doing well.  No further recurrence of symptoms.   REVIEW OF SYSTEMS: Full 14 system review of systems performed and negative with exception of: Fatigue palpitations blurred vision shortness of breath feeling cold confusion headache numbness slurred speech restless legs decreased energy ringing in ears  trouble swallowing.   ALLERGIES: Allergies  Allergen Reactions  . Compazine Other (See Comments)    Slurred speech, unsure of other reaction  . Statins Other (See Comments)    myalgias  . Sulfa Drugs Cross Reactors Rash    HOME MEDICATIONS: Outpatient Medications Prior to Visit  Medication Sig Dispense Refill  . ALPRAZolam (XANAX) 0.25 MG tablet 1/2 or a whole twice a day 30 tablet 0  . aspirin 81 MG tablet Take 1 tablet (81 mg total) daily by mouth.    . ezetimibe (ZETIA) 10 MG tablet Take 1 tablet (10 mg total) by mouth daily. 90 tablet 3  . levothyroxine (SYNTHROID, LEVOTHROID) 75 MCG tablet TAKE 1 TABLET TUESDAY TO   SUNDAY AND 1/2 TABLET ON   MONDAY 88 tablet 1   No facility-administered medications prior to visit.     PAST MEDICAL HISTORY: Past Medical History:  Diagnosis Date  . Cancer (HCC)    Skin Cancer, Squamous Cell  . Diastolic dysfunction 18/56/3149   Ejection fraction 75%.  . Dry eyes, bilateral   . Family history of anesthesia complication    Mother was hard to wake up after anesthesia  . Frequency of urination   . Heart murmur   . Hyperlipidemia    Diet controlled  . Hypothyroid   . Migraine 2013  . Mitral valve prolapse   . Stroke [facility])    3 episodes in past yr  . TIA (transient ischemic attack) 02/19/2012    PAST SURGICAL HISTORY: Past Surgical History:  Procedure Laterality Date  . BALLOON DILATION N/A 07/28/2013   Procedure: BALLOON DILATION;  Surgeon:  Rogene Houston, MD;  [facility];  Service: Endoscopy;  Laterality: N/A;  . BREAST BIOPSY     left benign tumor  . CATARACT EXTRACTION W/PHACO Right 07/11/2013   Procedure: CATARACT EXTRACTION PHACO AND INTRAOCULAR LENS PLACEMENT (IOC);  Surgeon: Tonny Branch, MD;  Location: [facility];  Service: Ophthalmology;  Laterality: Right;  CDE 9.99  . CATARACT EXTRACTION W/PHACO Left 08/04/2013   Procedure: CATARACT EXTRACTION PHACO AND INTRAOCULAR LENS PLACEMENT (IOC);  Surgeon: Tonny Branch, MD;   Location: [facility];  Service: Ophthalmology;  Laterality: Left;  CDE:16.26  . CHOLECYSTECTOMY    . COLONOSCOPY  12/18/2010   Procedure: COLONOSCOPY;  Surgeon: Rogene Houston, MD;  [facility];  Service: Endoscopy;  Laterality: N/A;  1:00 pm  . COLONOSCOPY N/A 03/06/2016   Procedure: COLONOSCOPY;  Surgeon: Rogene Houston, MD;  [facility];  Service: Endoscopy;  Laterality: N/A;  1200  . ESOPHAGOGASTRODUODENOSCOPY    . ESOPHAGOGASTRODUODENOSCOPY N/A 07/28/2013   Procedure: ESOPHAGOGASTRODUODENOSCOPY (EGD);  Surgeon: Rogene Houston, MD;  [facility];  Service: Endoscopy;  Laterality: N/A;  125  . MALONEY DILATION N/A 07/28/2013   Procedure: MALONEY DILATION;  Surgeon: Rogene Houston, MD;  [facility];  Service: Endoscopy;  Laterality: N/A;  . SAVORY DILATION N/A 07/28/2013   Procedure: SAVORY DILATION;  Surgeon: Rogene Houston, MD;  [facility];  Service: Endoscopy;  Laterality: N/A;  . THYROIDECTOMY  11/20/2011   Procedure: THYROIDECTOMY;  Surgeon: Izora Gala, MD;  Location: [place];  Service: ENT;  Laterality: Left;  LEFT THYROID LOBECTOMY  . TUBAL LIGATION     39 yrs ago.    FAMILY HISTORY: Family History  Problem Relation Age of Onset  . Heart disease Mother   . Hypertension Mother   . Stroke Mother   . Stroke Maternal Grandmother   . Cancer Paternal Grandmother        breast  . Colon cancer Neg Hx     SOCIAL HISTORY:  Social History   Socioeconomic History  . Marital status: Widowed    Spouse name: Not on file  . Number of children: Not on file  . Years of education: Not on file  . Highest education level: Not on file  Social Needs  . Financial resource strain: Not on file  . Food insecurity - worry: Not on file  . Food insecurity - inability: Not on file  . Transportation needs - medical: Not on file  . Transportation needs - non-medical: Not on file  Occupational History  . Not on file  Tobacco Use  .  Smoking status: Never Smoker  . Smokeless tobacco: Never Used  Substance and Sexual Activity  . Alcohol use: No    Alcohol/week: 0.0 oz  . Drug use: No  . Sexual activity: Not Currently  Other Topics Concern  . Not on file  Social History Narrative   Lives alone   caffeine- one soda daily, coffee, 1/2 cup   2 children   High school     PHYSICAL EXAM  GENERAL EXAM/CONSTITUTIONAL: Vitals:  Vitals:   03/03/17 1427  BP: 127/63  Pulse: 61  Weight: 132 lb 3.2 oz (60 kg)  Height: 5' 2.5" (1.588 m)     Body mass index is 23.79 kg/m.  Visual Acuity Screening   Right eye Left eye Both eyes  Without correction: 20/20 20/30   With correction:        Patient  is in no distress; well developed, nourished and groomed; neck is supple  CARDIOVASCULAR:  Examination of carotid arteries is normal; no carotid bruits  Regular rate and rhythm, no murmurs  Examination of peripheral vascular system by observation and palpation is normal  EYES:  Ophthalmoscopic exam of optic discs and posterior segments is normal; no papilledema or hemorrhages  MUSCULOSKELETAL:  Gait, strength, tone, movements noted in Neurologic exam below  NEUROLOGIC: MENTAL STATUS:  No flowsheet data found.  awake, alert, oriented to person, place and time  recent and remote memory intact  normal attention and concentration  language fluent, comprehension intact, naming intact,   fund of knowledge appropriate  CRANIAL NERVE:   2nd - no papilledema on fundoscopic exam  2nd, 3rd, 4th, 6th - pupils equal and reactive to light, visual fields full to confrontation, extraocular muscles intact, no nystagmus  5th - facial sensation symmetric  7th - facial strength symmetric  8th - hearing intact  9th - palate elevates symmetrically, uvula midline  11th - shoulder shrug symmetric  12th - tongue protrusion midline  MOTOR:   normal bulk and tone, full strength in the BUE, BLE  SENSORY:    normal and symmetric to light touch, pinprick, temperature, vibration  COORDINATION:   finger-nose-finger, fine finger movements normal  REFLEXES:   deep tendon reflexes present and symmetric  GAIT/STATION:   narrow based gait; able to walk tandem; romberg is negative    DIAGNOSTIC DATA (LABS, IMAGING, TESTING) - I reviewed patient records, labs, notes, testing and imaging myself where available.  Lab Results  Component Value Date   WBC 7.4 02/09/2017   HGB 14.4 02/09/2017   HCT 43.6 02/09/2017   MCV 90.5 02/09/2017   PLT 186 02/09/2017      Component Value Date/Time   NA 136 02/09/2017 1257   NA 141 12/23/2016 1014   K 4.3 02/09/2017 1257   CL 100 (L) 02/09/2017 1257   CO2 30 02/09/2017 1257   GLUCOSE 85 02/09/2017 1257   BUN 10 02/09/2017 1257   BUN 10 12/23/2016 1014   CREATININE 0.73 02/09/2017 1257   CREATININE 0.91 03/12/2016 0857   CALCIUM 9.7 02/09/2017 1257   PROT 7.9 02/09/2017 1257   PROT 6.8 10/09/2015 0912   ALBUMIN 4.5 02/09/2017 1257   ALBUMIN 4.1 10/09/2015 0912   AST 23 02/09/2017 1257   ALT 17 02/09/2017 1257   ALKPHOS 54 02/09/2017 1257   BILITOT 1.1 02/09/2017 1257   BILITOT 0.4 10/09/2015 0912   GFRNONAA >60 02/09/2017 1257   GFRAA >60 02/09/2017 1257   Lab Results  Component Value Date   CHOL 152 02/10/2017   HDL 34 (L) 02/10/2017   LDLCALC 97 02/10/2017   TRIG 106 02/10/2017   CHOLHDL 4.5 02/10/2017   Lab Results  Component Value Date   HGBA1C 5.0 02/10/2017   Lab Results  Component Value Date   VITAMINB12 1,135 (H) 05/01/2015   Lab Results  Component Value Date   TSH 2.192 02/09/2017    02/20/12 MRI brain  - No acute or focal intracranial abnormality.  Minimal white matter disease is stable from 2010.  02/20/12 MRA head  - 75-90% of stenosis left ICA cavernous/supraclinoid junction. - Mild intracranial atherosclerotic changes as described without proximal MCA lesion.  03/08/12 cerebral angiogram 1.   Angiographically no evidence of occlusions, stenosis, dissections, aneurysms or of arteriovenous shunting 2.  Venous outflow within normal limits.  02/09/17 MRI brain - No acute finding. Unremarkable study for age that  is stable from 2013.  02/09/17 MRA head  1.  No acute finding or branch occlusion. 2. Mild to moderate narrowing at the anterior genu left cavernous carotid where there is atherosclerotic calcification by CT.  02/10/17 TTE - Left ventricle: The cavity size was normal. Wall thickness was   increased in a pattern of mild LVH. Systolic function was normal.   The estimated ejection fraction was in the range of 60% to 65%.   Wall motion was normal; there were no regional wall motion   abnormalities. Doppler parameters are consistent with abnormal   left ventricular relaxation (grade 1 diastolic dysfunction). - Aortic valve: There was trivial regurgitation. - Atrial septum: No defect or patent foramen ovale was identified.    ASSESSMENT AND PLAN  71 y.o. year old female here with recurrent episodes of blurred vision, wavy lines, floaters, slurred speech, trouble talking, headaches, likely representing migraine with aura.  TIA is also possibility although symptoms are more diffuse than one vascular distribution.  Agree with completed TIA/stroke workup.  No further neurologic testing advised from my standpoint.  Continue aspirin and Zetia.   Ddx: migraine with aura vs TIA  1. TIA (transient ischemic attack)   2. Migraine with aura and without status migrainosus, not intractable      PLAN:  - continue aspirin 81mg  and zetia - follow up with cardiac monitor testing - monitor migraine HA and sxs for now (infrequent for now; treat conservatively)  Return if symptoms worsen or fail to improve, for return to PCP.    Penni Bombard, MD 49/03/7913, 0:56 PM Certified in Neurology, Neurophysiology and Neuroimaging  [facility] [address],  [place], [place] [address] [phone]

## 2017-03-03 NOTE — Patient Instructions (Signed)

## 2017-03-05 ENCOUNTER — Telehealth: Payer: Self-pay | Admitting: Cardiovascular Disease

## 2017-03-05 NOTE — Telephone Encounter (Signed)
Patient has stopped wearing monitor because it was too inconvenient to wear at night.She kept pulling it off at night.Was not interested in wearing just during the day either. I will FYI Dr Bronson Ing

## 2017-03-05 NOTE — Telephone Encounter (Signed)
Attempt to reach, VM full-cc

## 2017-03-05 NOTE — Telephone Encounter (Signed)
Patient states that her monitor kept coming off at night so she stopped wearing it on Saturday. Wants to know if she needs to continue to wear. / tg

## 2017-03-18 ENCOUNTER — Telehealth: Payer: Self-pay | Admitting: *Deleted

## 2017-03-18 DIAGNOSIS — L570 Actinic keratosis: Secondary | ICD-10-CM | POA: Diagnosis not present

## 2017-03-18 DIAGNOSIS — T148XXA Other injury of unspecified body region, initial encounter: Secondary | ICD-10-CM | POA: Diagnosis not present

## 2017-03-18 NOTE — Telephone Encounter (Signed)
-----   Message from Laurine Blazer, LPN sent at 03/23/4974  8:22 AM EST -----   ----- Message ----- From: Herminio Commons, MD Sent: 03/17/2017   4:52 PM To: Laurine Blazer, LPN  No abnormal heart rhythms.

## 2017-03-18 NOTE — Telephone Encounter (Signed)
Called patient with test results. No answer. Left message to call back.  

## 2017-05-11 DIAGNOSIS — E039 Hypothyroidism, unspecified: Secondary | ICD-10-CM | POA: Diagnosis not present

## 2017-05-11 DIAGNOSIS — Z8249 Family history of ischemic heart disease and other diseases of the circulatory system: Secondary | ICD-10-CM | POA: Diagnosis not present

## 2017-05-11 DIAGNOSIS — Z8673 Personal history of transient ischemic attack (TIA), and cerebral infarction without residual deficits: Secondary | ICD-10-CM | POA: Diagnosis not present

## 2017-05-11 DIAGNOSIS — Z823 Family history of stroke: Secondary | ICD-10-CM | POA: Diagnosis not present

## 2017-05-11 DIAGNOSIS — E785 Hyperlipidemia, unspecified: Secondary | ICD-10-CM | POA: Diagnosis not present

## 2017-05-11 DIAGNOSIS — Z882 Allergy status to sulfonamides status: Secondary | ICD-10-CM | POA: Diagnosis not present

## 2017-05-14 DIAGNOSIS — H26493 Other secondary cataract, bilateral: Secondary | ICD-10-CM | POA: Diagnosis not present

## 2017-05-14 DIAGNOSIS — H16223 Keratoconjunctivitis sicca, not specified as Sjogren's, bilateral: Secondary | ICD-10-CM | POA: Diagnosis not present

## 2017-05-25 ENCOUNTER — Other Ambulatory Visit (HOSPITAL_COMMUNITY)
Admission: RE | Admit: 2017-05-25 | Discharge: 2017-05-25 | Disposition: A | Discharge status: Home / Self Care | Payer: Medicare HMO | Source: Ambulatory Visit | Attending: Cardiovascular Disease | Admitting: Cardiovascular Disease

## 2017-05-25 ENCOUNTER — Encounter: Payer: Self-pay | Admitting: Cardiovascular Disease

## 2017-05-25 ENCOUNTER — Telehealth: Payer: Self-pay | Admitting: Cardiovascular Disease

## 2017-05-25 ENCOUNTER — Ambulatory Visit: Payer: Medicare HMO | Admitting: Cardiovascular Disease

## 2017-05-25 VITALS — BP 124/64 | HR 80 | Ht 62.0 in | Wt 131.0 lb

## 2017-05-25 DIAGNOSIS — G459 Transient cerebral ischemic attack, unspecified: Secondary | ICD-10-CM | POA: Diagnosis not present

## 2017-05-25 DIAGNOSIS — R5383 Other fatigue: Secondary | ICD-10-CM | POA: Diagnosis not present

## 2017-05-25 DIAGNOSIS — R079 Chest pain, unspecified: Secondary | ICD-10-CM

## 2017-05-25 DIAGNOSIS — E785 Hyperlipidemia, unspecified: Secondary | ICD-10-CM

## 2017-05-25 DIAGNOSIS — E782 Mixed hyperlipidemia: Secondary | ICD-10-CM | POA: Insufficient documentation

## 2017-05-25 DIAGNOSIS — R252 Cramp and spasm: Secondary | ICD-10-CM

## 2017-05-25 DIAGNOSIS — R002 Palpitations: Secondary | ICD-10-CM

## 2017-05-25 DIAGNOSIS — R209 Unspecified disturbances of skin sensation: Secondary | ICD-10-CM | POA: Diagnosis not present

## 2017-05-25 LAB — LIPID PANEL
Cholesterol: 172 mg/dL (ref 0–200)
HDL: 42 mg/dL (ref >40)
LDL Cholesterol: 91 mg/dL (ref 0–99)
Total CHOL/HDL Ratio: 4.1 RATIO
Triglycerides: 193 mg/dL — ABNORMAL HIGH (ref <150)
VLDL: 39 mg/dL (ref 0–40)

## 2017-05-25 NOTE — Telephone Encounter (Signed)
I spoke with patient,given results

## 2017-05-25 NOTE — Patient Instructions (Signed)
Your physician recommends that you schedule a follow-up appointment in: 1 month with Dr Bronson Ing   Your physician recommends that you continue on your current medications as directed. Please refer to the Current Medication list given to you today.    If you need a refill on your cardiac medications before your next appointment, please call your pharmacy.     Get Lipids and Vit D level drawn     No tests ordered today     Thank you for choosing [facility] !

## 2017-05-25 NOTE — Progress Notes (Signed)
SUBJECTIVE: The patient presents for routine follow-up.  She wore a cardiac monitor which demonstrated sinus rhythm with no arrhythmias.  Her symptoms correlated with sinus rhythm.  Nuclear stress test on 01/01/17 demonstrated horizontal ST segment depressions during stress in II, III, aVF, V4, V5 and V6 leads.  However, nuclear myocardial perfusion imaging did not demonstrate any evidence of infarction or ischemia.  It was a low risk study.  There was a hypertensive response to exercise.  Echocardiogram showed normal LV systolic function and regional wall motion, LVEF 60-65%, with grade 1 diastolic dysfunction. No PFO was seen.  She recently returned from the beach.  She noticed exertional dyspnea which has not changed since I last saw her.  She denies having the symptoms a year ago.  She did have an hour of retrosternal chest pain located in the xiphoid region.  It was not associated with palpitations, lightheadedness, or shortness of breath.  She applied some Vicks vapor rub and by the following morning her symptoms were gone.  She denies diarrhea but has had some mild constipation.  She is unaware of apneic episodes.  She denies morning headaches.  She does feel fatigued upon awakening.  She continues to feel increasingly fatigued over the last several months.  Her TSH was normal when last checked in November 2018.  Vitamin B12 levels were high when checked in early 2018.     Review of Systems: As per "subjective", otherwise negative.  Allergies  Allergen Reactions  . Compazine Other (See Comments)    Slurred speech, unsure of other reaction  . Statins Other (See Comments)    myalgias  . Sulfa Drugs Cross Reactors Rash    Current Outpatient Medications  Medication Sig Dispense Refill  . ALPRAZolam (XANAX) 0.25 MG tablet 1/2 or a whole twice a day 30 tablet 0  . aspirin 81 MG tablet Take 1 tablet (81 mg total) daily by mouth.    . ezetimibe (ZETIA) 10 MG tablet Take 1 tablet  (10 mg total) by mouth daily. 90 tablet 3  . levothyroxine (SYNTHROID, LEVOTHROID) 75 MCG tablet TAKE 1 TABLET TUESDAY TO   SUNDAY AND 1/2 TABLET ON   MONDAY 88 tablet 1   No current facility-administered medications for this visit.     Past Medical History:  Diagnosis Date  . Cancer (HCC)    Skin Cancer, Squamous Cell  . Diastolic dysfunction 04/02/7251   Ejection fraction 75%.  . Dry eyes, bilateral   . Family history of anesthesia complication    Mother was hard to wake up after anesthesia  . Frequency of urination   . Heart murmur   . Hyperlipidemia    Diet controlled  . Hypothyroid   . Migraine 2013  . Mitral valve prolapse   . Stroke [facility])    3 episodes in past yr  . TIA (transient ischemic attack) 02/19/2012    Past Surgical History:  Procedure Laterality Date  . BALLOON DILATION N/A 07/28/2013   Procedure: BALLOON DILATION;  Surgeon: Rogene Houston, MD;  [facility];  Service: Endoscopy;  Laterality: N/A;  . BREAST BIOPSY     left benign tumor  . CATARACT EXTRACTION W/PHACO Right 07/11/2013   Procedure: CATARACT EXTRACTION PHACO AND INTRAOCULAR LENS PLACEMENT (IOC);  Surgeon: Tonny Branch, MD;  [facility];  Service: Ophthalmology;  Laterality: Right;  CDE 9.99  . CATARACT EXTRACTION W/PHACO Left 08/04/2013   Procedure: CATARACT EXTRACTION PHACO AND INTRAOCULAR LENS PLACEMENT (IOC);  Surgeon: Tonny Branch, MD;  [facility];  Service: Ophthalmology;  Laterality: Left;  CDE:16.26  . CHOLECYSTECTOMY    . COLONOSCOPY  12/18/2010   Procedure: COLONOSCOPY;  Surgeon: Rogene Houston, MD;  [facility];  Service: Endoscopy;  Laterality: N/A;  1:00 pm  . COLONOSCOPY N/A 03/06/2016   Procedure: COLONOSCOPY;  Surgeon: Rogene Houston, MD;  [facility];  Service: Endoscopy;  Laterality: N/A;  1200  . ESOPHAGOGASTRODUODENOSCOPY    . ESOPHAGOGASTRODUODENOSCOPY N/A 07/28/2013   Procedure: ESOPHAGOGASTRODUODENOSCOPY (EGD);  Surgeon: Rogene Houston, MD;  [facility];  Service: Endoscopy;  Laterality: N/A;  125  . MALONEY DILATION N/A 07/28/2013   Procedure: MALONEY DILATION;  Surgeon: Rogene Houston, MD;  [facility];  Service: Endoscopy;  Laterality: N/A;  . SAVORY DILATION N/A 07/28/2013   Procedure: SAVORY DILATION;  Surgeon: Rogene Houston, MD;  [facility];  Service: Endoscopy;  Laterality: N/A;  . THYROIDECTOMY  11/20/2011   Procedure: THYROIDECTOMY;  Surgeon: Izora Gala, MD;  Location: [place];  Service: ENT;  Laterality: Left;  LEFT THYROID LOBECTOMY  . TUBAL LIGATION     39 yrs ago.    Social History   Socioeconomic History  . Marital status: Widowed    Spouse name: Not on file  . Number of children: Not on file  . Years of education: Not on file  . Highest education level: Not on file  Social Needs  . Financial resource strain: Not on file  . Food insecurity - worry: Not on file  . Food insecurity - inability: Not on file  . Transportation needs - medical: Not on file  . Transportation needs - non-medical: Not on file  Occupational History  . Not on file  Tobacco Use  . Smoking status: Never Smoker  . Smokeless tobacco: Never Used  Substance and Sexual Activity  . Alcohol use: No    Alcohol/week: 0.0 oz  . Drug use: No  . Sexual activity: Not Currently  Other Topics Concern  . Not on file  Social History Narrative   Lives alone   caffeine- one soda daily, coffee, 1/2 cup   2 children   High school     There were no vitals filed for this visit.  Wt Readings from Last 3 Encounters:  03/03/17 132 lb 3.2 oz (60 kg)  02/17/17 131 lb 2 oz (59.5 kg)  02/16/17 131 lb (59.4 kg)     PHYSICAL EXAM General: NAD HEENT: Normal. Neck: No JVD, no thyromegaly. Lungs: Clear to auscultation bilaterally with normal respiratory effort. CV: Regular rate and rhythm, normal S1/S2, no S3/S4, no murmur. No pretibial or periankle edema.  No carotid bruit.   Abdomen: Soft,  nontender, no distention.  Neurologic: Alert and oriented.  Psych: Normal affect. Skin: Normal. Musculoskeletal: No gross deformities.    ECG: Most recent ECG reviewed.   Labs: Lab Results  Component Value Date/Time   K 4.3 02/09/2017 12:57 PM   BUN 10 02/09/2017 12:57 PM   BUN 10 12/23/2016 10:14 AM   CREATININE 0.73 02/09/2017 12:57 PM   CREATININE 0.91 03/12/2016 08:57 AM   ALT 17 02/09/2017 12:57 PM   TSH 2.192 02/09/2017 01:30 PM   TSH 1.780 12/23/2016 10:14 AM   HGB 14.4 02/09/2017 12:57 PM   HGB 12.9 06/05/2015 09:50 AM     Lipids: Lab Results  Component Value Date/Time   LDLCALC 97 02/10/2017 05:36 AM  LDLCALC 163 (H) 08/07/2016 08:32 AM   CHOL 152 02/10/2017 05:36 AM   CHOL 230 (H) 08/07/2016 08:32 AM   TRIG 106 02/10/2017 05:36 AM   HDL 34 (L) 02/10/2017 05:36 AM   HDL 42 08/07/2016 08:32 AM       ASSESSMENT AND PLAN:  1. Chest pressure:  She did have a recent recurrence lasting an hour.  Nuclear stress test was low risk based on myocardial perfusion imaging but there were ECG abnormalities as noted above..  She did develop a hypertensive response to exercise.  We talked about the possibility of undergoing coronary angiography.  She would like to think about it.  2. Left calf crampswith cold feet:By physical examination, she has good arterial flow with normal pulses. I previously recommended some home remedies.  3. Hyperlipidemia: Lipids markedly elevated as previously reviewed. She is statin intolerant. I started Zetia 10 mg daily at a prior visit. Lipids 02/10/17-total cholesterol 152, triglycerides 106, HDL 34, LDL 97.  I will repeat lipids.  4.  Question of TIA: She is now on aspirin.  She is also on Zetia as she is statin intolerant.   No evidence of atrial fibrillation or any other arrhythmias by cardiac monitoring.  5.  Palpitations: Event monitor did not demonstrate any significant arrhythmias.  Symptoms correlated with sinus rhythm.  6.   Fatigue: Unclear whether she has sleep disordered breathing.  She does have a history of snoring but denies morning headaches.  We talked about the possibility of a sleep study.  She wants to think about it.  I will check vitamin D levels.   Disposition: Follow up 1 month  Time spent: 40 minutes, of which greater than 50% was spent reviewing symptoms, relevant blood tests and studies, and discussing management plan with the patient.    Kate Sable, M.D., F.A.C.C.

## 2017-05-25 NOTE — Telephone Encounter (Signed)
Patient returning call.

## 2017-05-26 LAB — VITAMIN D 25 HYDROXY (VIT D DEFICIENCY, FRACTURES): Vit D, 25-Hydroxy: 31.6 ng/mL (ref 30.0–100.0)

## 2017-05-28 DIAGNOSIS — H16223 Keratoconjunctivitis sicca, not specified as Sjogren's, bilateral: Secondary | ICD-10-CM | POA: Diagnosis not present

## 2017-06-05 ENCOUNTER — Ambulatory Visit: Payer: Medicare HMO | Admitting: Family Medicine

## 2017-06-05 ENCOUNTER — Encounter: Payer: Self-pay | Admitting: Family Medicine

## 2017-06-05 VITALS — BP 132/86 | Temp 98.6°F | Ht 62.0 in | Wt 128.2 lb

## 2017-06-05 DIAGNOSIS — J111 Influenza due to unidentified influenza virus with other respiratory manifestations: Secondary | ICD-10-CM | POA: Diagnosis not present

## 2017-06-05 MED ORDER — AMOXICILLIN 500 MG PO CAPS: 500.0000 mg | ORAL_CAPSULE | Freq: Three times a day (TID) | ORAL | 0 refills | Status: DC

## 2017-06-05 MED ORDER — OSELTAMIVIR PHOSPHATE 75 MG PO CAPS: 75.0000 mg | ORAL_CAPSULE | Freq: Two times a day (BID) | ORAL | 0 refills | Status: AC

## 2017-06-05 NOTE — Progress Notes (Signed)
   Subjective:    Patient ID: Grace [name], female    DOB: [birth year], 72 y.o.   MRN: [id number]  Cough  This is a new problem. The current episode started yesterday. Associated symptoms include rhinorrhea and a sore throat. Associated symptoms comments: congestion. Treatments tried: Delsym DM  The treatment provided mild relief.   Pt believes to have bronchitis.  Last night kicked in al of a sudden  Felt aching in the tmeples   sldo noted tightness in the chest  Came on suddenly     Wonders if "stuff in the air"  Some expsoures Abnormal feeling both temples   Kicked in  Last eve    Energy level this morn none  appetit no ngood appetite  Sudden onset of symptoms  No fever or cukks  Review of Systems  HENT: Positive for rhinorrhea and sore throat.   Respiratory: Positive for cough.        Objective:   Physical Exam  Alert vitals reviewed, moderate malaise. Hydration good. Positive nasal congestion lungs no crackles or wheezes, no tachypnea, intermittent bronchial cough during exam heart regular rate and rhythm.       Assessment & Plan:  Impression influenza discussed at length. Petra Kuba of illness and potential sequela discussed. Plan Tamiflu prescribed if indicated and timing appropriate. Symptom care discussed. Warning signs discussed. WSL Patient also given amoxicillin to cover for potential bronchitis.  I think she has the flu, she thinks she has bronchitis will cover for both

## 2017-06-22 ENCOUNTER — Ambulatory Visit: Payer: Medicare HMO | Admitting: Family Medicine

## 2017-06-29 ENCOUNTER — Ambulatory Visit: Payer: Medicare HMO | Admitting: Cardiovascular Disease

## 2017-07-01 ENCOUNTER — Other Ambulatory Visit: Payer: Self-pay | Admitting: Cardiovascular Disease

## 2017-07-01 ENCOUNTER — Ambulatory Visit: Payer: Medicare HMO | Admitting: Cardiovascular Disease

## 2017-07-01 ENCOUNTER — Encounter: Payer: Self-pay | Admitting: *Deleted

## 2017-07-01 ENCOUNTER — Other Ambulatory Visit: Payer: Self-pay

## 2017-07-01 ENCOUNTER — Telehealth: Payer: Self-pay | Admitting: Cardiovascular Disease

## 2017-07-01 ENCOUNTER — Encounter: Payer: Self-pay | Admitting: Cardiovascular Disease

## 2017-07-01 VITALS — BP 136/78 | HR 61 | Ht 62.0 in | Wt 130.0 lb

## 2017-07-01 DIAGNOSIS — R5383 Other fatigue: Secondary | ICD-10-CM

## 2017-07-01 DIAGNOSIS — Z01812 Encounter for preprocedural laboratory examination: Secondary | ICD-10-CM | POA: Diagnosis not present

## 2017-07-01 DIAGNOSIS — G459 Transient cerebral ischemic attack, unspecified: Secondary | ICD-10-CM | POA: Diagnosis not present

## 2017-07-01 DIAGNOSIS — R209 Unspecified disturbances of skin sensation: Secondary | ICD-10-CM

## 2017-07-01 DIAGNOSIS — I209 Angina pectoris, unspecified: Secondary | ICD-10-CM

## 2017-07-01 DIAGNOSIS — E782 Mixed hyperlipidemia: Secondary | ICD-10-CM | POA: Diagnosis not present

## 2017-07-01 DIAGNOSIS — R079 Chest pain, unspecified: Secondary | ICD-10-CM | POA: Diagnosis not present

## 2017-07-01 DIAGNOSIS — R002 Palpitations: Secondary | ICD-10-CM | POA: Diagnosis not present

## 2017-07-01 NOTE — Progress Notes (Signed)
SUBJECTIVE: The patient presents for follow-up of chest pressure. She wore a cardiac monitor which demonstrated sinus rhythm with no arrhythmias.  Her symptoms correlated with sinus rhythm.  Nuclear stress test on 01/01/17 demonstrated horizontal ST segment depressions during stress inII, III, aVF, V4, V5 and V6 leads.However, nuclear myocardial perfusion imaging did not demonstrate any evidence of infarction or ischemia. It was a low risk study. There was a hypertensive response to exercise.  Echocardiogram showed normal LV systolic function and regional wall motion, LVEF 60-65%, with grade 1 diastolic dysfunction. No PFO was seen.  Vitamin D levels were low normal at 31.6 on 05/25/17.  Lipid panel 05/25/17 showed total cholesterol 172, triglycerides 193, HDL 42, LDL 91.   She had some shortness of breath and it was felt she may have had the flu and possibly bronchitis.  She was prescribed amoxicillin and Tamiflu by her PCP with no improvement of symptoms.  The patient then took over-the-counter Delsym and felt better.  She continues to experience chest pressure with exertion.  She continues to feel fatigued.  Yesterday when walking from her living room to her bedroom she had significant left-sided jaw pain which she describes as a "toothache pain ".  She also describes some additional symptoms such as left thigh and hip tingling and numbness with tenderness to palpation.     Review of Systems: As per "subjective", otherwise negative.  Allergies  Allergen Reactions  . Compazine Other (See Comments)    Slurred speech, unsure of other reaction  . Statins Other (See Comments)    myalgias  . Sulfa Drugs Cross Reactors Rash    Current Outpatient Medications  Medication Sig Dispense Refill  . ALPRAZolam (XANAX) 0.25 MG tablet 1/2 or a whole twice a day 30 tablet 0  . aspirin 81 MG tablet Take 1 tablet (81 mg total) daily by mouth.    . Cholecalciferol (VITAMIN D3) 1000  units CAPS Take by mouth.    . Cyanocobalamin (VITAMIN B-12 PO) Take 1,500 mcg by mouth.    . ezetimibe (ZETIA) 10 MG tablet Take 10 mg by mouth daily.    Marland Kitchen levothyroxine (SYNTHROID, LEVOTHROID) 75 MCG tablet TAKE 1 TABLET TUESDAY TO   SUNDAY AND 1/2 TABLET ON   MONDAY 88 tablet 1   No current facility-administered medications for this visit.     Past Medical History:  Diagnosis Date  . Cancer (HCC)    Skin Cancer, Squamous Cell  . Diastolic dysfunction 32/99/2426   Ejection fraction 75%.  . Dry eyes, bilateral   . Family history of anesthesia complication    Mother was hard to wake up after anesthesia  . Frequency of urination   . Heart murmur   . Hyperlipidemia    Diet controlled  . Hypothyroid   . Migraine 2013  . Mitral valve prolapse   . Stroke [facility])    3 episodes in past yr  . TIA (transient ischemic attack) 02/19/2012    Past Surgical History:  Procedure Laterality Date  . BALLOON DILATION N/A 07/28/2013   Procedure: BALLOON DILATION;  Surgeon: Rogene Houston, MD;  [facility];  Service: Endoscopy;  Laterality: N/A;  . BREAST BIOPSY     left benign tumor  . CATARACT EXTRACTION W/PHACO Right 07/11/2013   Procedure: CATARACT EXTRACTION PHACO AND INTRAOCULAR LENS PLACEMENT (IOC);  Surgeon: Tonny Branch, MD;  [facility]: [facility];  Service: Ophthalmology;  Laterality: Right;  CDE 9.99  . CATARACT EXTRACTION W/PHACO  Left 08/04/2013   Procedure: CATARACT EXTRACTION PHACO AND INTRAOCULAR LENS PLACEMENT (IOC);  Surgeon: Tonny Branch, MD;  Location: [facility];  Service: Ophthalmology;  Laterality: Left;  CDE:16.26  . CHOLECYSTECTOMY    . COLONOSCOPY  12/18/2010   Procedure: COLONOSCOPY;  Surgeon: Rogene Houston, MD;  [facility];  Service: Endoscopy;  Laterality: N/A;  1:00 pm  . COLONOSCOPY N/A 03/06/2016   Procedure: COLONOSCOPY;  Surgeon: Rogene Houston, MD;  [facility];  Service: Endoscopy;  Laterality: N/A;  1200  . ESOPHAGOGASTRODUODENOSCOPY      . ESOPHAGOGASTRODUODENOSCOPY N/A 07/28/2013   Procedure: ESOPHAGOGASTRODUODENOSCOPY (EGD);  Surgeon: Rogene Houston, MD;  [facility];  Service: Endoscopy;  Laterality: N/A;  125  . MALONEY DILATION N/A 07/28/2013   Procedure: MALONEY DILATION;  Surgeon: Rogene Houston, MD;  [facility];  Service: Endoscopy;  Laterality: N/A;  . SAVORY DILATION N/A 07/28/2013   Procedure: SAVORY DILATION;  Surgeon: Rogene Houston, MD;  [facility];  Service: Endoscopy;  Laterality: N/A;  . THYROIDECTOMY  11/20/2011   Procedure: THYROIDECTOMY;  Surgeon: Izora Gala, MD;  Location: [place];  Service: ENT;  Laterality: Left;  LEFT THYROID LOBECTOMY  . TUBAL LIGATION     39 yrs ago.    Social History   Socioeconomic History  . Marital status: Widowed    Spouse name: Not on file  . Number of children: Not on file  . Years of education: Not on file  . Highest education level: Not on file  Occupational History  . Not on file  Social Needs  . Financial resource strain: Not on file  . Food insecurity:    Worry: Not on file    Inability: Not on file  . Transportation needs:    Medical: Not on file    Non-medical: Not on file  Tobacco Use  . Smoking status: Never Smoker  . Smokeless tobacco: Never Used  Substance and Sexual Activity  . Alcohol use: No    Alcohol/week: 0.0 oz  . Drug use: No  . Sexual activity: Not Currently  Lifestyle  . Physical activity:    Days per week: Not on file    Minutes per session: Not on file  . Stress: Not on file  Relationships  . Social connections:    Talks on phone: Not on file    Gets together: Not on file    Attends religious service: Not on file    Active member of club or organization: Not on file    Attends meetings of clubs or organizations: Not on file    Relationship status: Not on file  . Intimate partner violence:    Fear of current or ex partner: Not on file    Emotionally abused: Not on file    Physically  abused: Not on file    Forced sexual activity: Not on file  Other Topics Concern  . Not on file  Social History Narrative   Lives alone   caffeine- one soda daily, coffee, 1/2 cup   2 children   High school     Vitals:   07/01/17 1257  BP: 136/78  Pulse: 61  SpO2: 99%  Weight: 130 lb (59 kg)  Height: 5\' 2"  (1.575 m)    Wt Readings from Last 3 Encounters:  07/01/17 130 lb (59 kg)  06/05/17 128 lb 3.2 oz (58.2 kg)  05/25/17 131 lb (59.4 kg)  PHYSICAL EXAM General: NAD HEENT: Normal. Neck: No JVD, no thyromegaly. Lungs: Clear to auscultation bilaterally with normal respiratory effort. CV: Regular rate and rhythm, normal S1/S2, no S3/S4, no murmur. No pretibial or periankle edema.  No carotid bruit.   Abdomen: Soft, nontender, no distention.  Neurologic: Alert and oriented.  Psych: Normal affect. Skin: Normal. Musculoskeletal: No gross deformities.    ECG: Most recent ECG reviewed.   Labs: Lab Results  Component Value Date/Time   K 4.3 02/09/2017 12:57 PM   BUN 10 02/09/2017 12:57 PM   BUN 10 12/23/2016 10:14 AM   CREATININE 0.73 02/09/2017 12:57 PM   CREATININE 0.91 03/12/2016 08:57 AM   ALT 17 02/09/2017 12:57 PM   TSH 2.192 02/09/2017 01:30 PM   TSH 1.780 12/23/2016 10:14 AM   HGB 14.4 02/09/2017 12:57 PM   HGB 12.9 06/05/2015 09:50 AM     Lipids: Lab Results  Component Value Date/Time   LDLCALC 91 05/25/2017 01:50 PM   LDLCALC 163 (H) 08/07/2016 08:32 AM   CHOL 172 05/25/2017 01:50 PM   CHOL 230 (H) 08/07/2016 08:32 AM   TRIG 193 (H) 05/25/2017 01:50 PM   HDL 42 05/25/2017 01:50 PM   HDL 42 08/07/2016 08:32 AM       ASSESSMENT AND PLAN:  1. Chest pressure with jaw pain and fatigue/angina pectoris:  While her nuclear stress test was low risk based on myocardial perfusion imaging, there were ECG abnormalities as noted above and she continues to experience symptoms. She did develop a hypertensive response to exercise.    She has agreed to  proceed with coronary angiography.   Risks and benefits of cardiac catheterization have been discussed with the patient.  These include bleeding, infection, kidney damage, stroke, heart attack, death.  The patient understands these risks and is willing to proceed.  2. Left calf crampswith cold feet:By physical examination, she has good arterial flow with normal pulses. I previously recommended some home remedies.  3. Hyperlipidemia: Lipid panel 05/25/17 showed total cholesterol 172, triglycerides 193, HDL 42, LDL 91.  Continue Zetia.  4. Question of TIA: She is on ASA and Zetia and is statin intolerant.  No evidence of atrial fibrillation or any other arrhythmias by cardiac monitoring.  5. Palpitations: Event monitor did not demonstrate any significant arrhythmias.  Symptoms correlated with sinus rhythm.  6.  Fatigue: Unclear whether she has sleep disordered breathing.  She does have a history of snoring but denies morning headaches.  We again talked about the possibility of a sleep study.    I will first wait to see what coronary angiography shows.     Disposition: Follow up 6 weeks.  Time spent: 40 minutes, of which greater than 50% was spent reviewing symptoms, relevant blood tests and studies, and discussing management plan with the patient.    Kate Sable, M.D., F.A.C.C.

## 2017-07-01 NOTE — H&P (View-Only) (Signed)
SUBJECTIVE: The patient presents for follow-up of chest pressure. She wore a cardiac monitor which demonstrated sinus rhythm with no arrhythmias.  Her symptoms correlated with sinus rhythm.  Nuclear stress test on 01/01/17 demonstrated horizontal ST segment depressions during stress inII, III, aVF, V4, V5 and V6 leads.However, nuclear myocardial perfusion imaging did not demonstrate any evidence of infarction or ischemia. It was a low risk study. There was a hypertensive response to exercise.  Echocardiogram showed normal LV systolic function and regional wall motion, LVEF 60-65%, with grade 1 diastolic dysfunction. No PFO was seen.  Vitamin D levels were low normal at 31.6 on 05/25/17.  Lipid panel 05/25/17 showed total cholesterol 172, triglycerides 193, HDL 42, LDL 91.   She had some shortness of breath and it was felt she may have had the flu and possibly bronchitis.  She was prescribed amoxicillin and Tamiflu by her PCP with no improvement of symptoms.  The patient then took over-the-counter Delsym and felt better.  She continues to experience chest pressure with exertion.  She continues to feel fatigued.  Yesterday when walking from her living room to her bedroom she had significant left-sided jaw pain which she describes as a "toothache pain ".  She also describes some additional symptoms such as left thigh and hip tingling and numbness with tenderness to palpation.     Review of Systems: As per "subjective", otherwise negative.  Allergies  Allergen Reactions  . Compazine Other (See Comments)    Slurred speech, unsure of other reaction  . Statins Other (See Comments)    myalgias  . Sulfa Drugs Cross Reactors Rash    Current Outpatient Medications  Medication Sig Dispense Refill  . ALPRAZolam (XANAX) 0.25 MG tablet 1/2 or a whole twice a day 30 tablet 0  . aspirin 81 MG tablet Take 1 tablet (81 mg total) daily by mouth.    . Cholecalciferol (VITAMIN D3) 1000  units CAPS Take by mouth.    . Cyanocobalamin (VITAMIN B-12 PO) Take 1,500 mcg by mouth.    . ezetimibe (ZETIA) 10 MG tablet Take 10 mg by mouth daily.    Marland Kitchen levothyroxine (SYNTHROID, LEVOTHROID) 75 MCG tablet TAKE 1 TABLET TUESDAY TO   SUNDAY AND 1/2 TABLET ON   MONDAY 88 tablet 1   No current facility-administered medications for this visit.     Past Medical History:  Diagnosis Date  . Cancer (HCC)    Skin Cancer, Squamous Cell  . Diastolic dysfunction 26/71/2458   Ejection fraction 75%.  . Dry eyes, bilateral   . Family history of anesthesia complication    Mother was hard to wake up after anesthesia  . Frequency of urination   . Heart murmur   . Hyperlipidemia    Diet controlled  . Hypothyroid   . Migraine 2013  . Mitral valve prolapse   . Stroke [facility])    3 episodes in past yr  . TIA (transient ischemic attack) 02/19/2012    Past Surgical History:  Procedure Laterality Date  . BALLOON DILATION N/A 07/28/2013   Procedure: BALLOON DILATION;  Surgeon: Rogene Houston, MD;  [facility];  Service: Endoscopy;  Laterality: N/A;  . BREAST BIOPSY     left benign tumor  . CATARACT EXTRACTION W/PHACO Right 07/11/2013   Procedure: CATARACT EXTRACTION PHACO AND INTRAOCULAR LENS PLACEMENT (IOC);  Surgeon: Tonny Branch, MD;  [facility];  Service: Ophthalmology;  Laterality: Right;  CDE 9.99  . CATARACT EXTRACTION W/PHACO  Left 08/04/2013   Procedure: CATARACT EXTRACTION PHACO AND INTRAOCULAR LENS PLACEMENT (IOC);  Surgeon: Tonny Branch, MD;  [facility];  Service: Ophthalmology;  Laterality: Left;  CDE:16.26  . CHOLECYSTECTOMY    . COLONOSCOPY  12/18/2010   Procedure: COLONOSCOPY;  Surgeon: Rogene Houston, MD;  [facility];  Service: Endoscopy;  Laterality: N/A;  1:00 pm  . COLONOSCOPY N/A 03/06/2016   Procedure: COLONOSCOPY;  Surgeon: Rogene Houston, MD;  [facility];  Service: Endoscopy;  Laterality: N/A;  1200  . ESOPHAGOGASTRODUODENOSCOPY      . ESOPHAGOGASTRODUODENOSCOPY N/A 07/28/2013   Procedure: ESOPHAGOGASTRODUODENOSCOPY (EGD);  Surgeon: Rogene Houston, MD;  [facility];  Service: Endoscopy;  Laterality: N/A;  125  . MALONEY DILATION N/A 07/28/2013   Procedure: MALONEY DILATION;  Surgeon: Rogene Houston, MD;  [facility];  Service: Endoscopy;  Laterality: N/A;  . SAVORY DILATION N/A 07/28/2013   Procedure: SAVORY DILATION;  Surgeon: Rogene Houston, MD;  [facility];  Service: Endoscopy;  Laterality: N/A;  . THYROIDECTOMY  11/20/2011   Procedure: THYROIDECTOMY;  Surgeon: Izora Gala, MD;  Location: [place];  Service: ENT;  Laterality: Left;  LEFT THYROID LOBECTOMY  . TUBAL LIGATION     39 yrs ago.    Social History   Socioeconomic History  . Marital status: Widowed    Spouse name: Not on file  . Number of children: Not on file  . Years of education: Not on file  . Highest education level: Not on file  Occupational History  . Not on file  Social Needs  . Financial resource strain: Not on file  . Food insecurity:    Worry: Not on file    Inability: Not on file  . Transportation needs:    Medical: Not on file    Non-medical: Not on file  Tobacco Use  . Smoking status: Never Smoker  . Smokeless tobacco: Never Used  Substance and Sexual Activity  . Alcohol use: No    Alcohol/week: 0.0 oz  . Drug use: No  . Sexual activity: Not Currently  Lifestyle  . Physical activity:    Days per week: Not on file    Minutes per session: Not on file  . Stress: Not on file  Relationships  . Social connections:    Talks on phone: Not on file    Gets together: Not on file    Attends religious service: Not on file    Active member of club or organization: Not on file    Attends meetings of clubs or organizations: Not on file    Relationship status: Not on file  . Intimate partner violence:    Fear of current or ex partner: Not on file    Emotionally abused: Not on file    Physically  abused: Not on file    Forced sexual activity: Not on file  Other Topics Concern  . Not on file  Social History Narrative   Lives alone   caffeine- one soda daily, coffee, 1/2 cup   2 children   High school     Vitals:   07/01/17 1257  BP: 136/78  Pulse: 61  SpO2: 99%  Weight: 130 lb (59 kg)  Height: 5\' 2"  (1.575 m)    Wt Readings from Last 3 Encounters:  07/01/17 130 lb (59 kg)  06/05/17 128 lb 3.2 oz (58.2 kg)  05/25/17 131 lb (59.4 kg)  PHYSICAL EXAM General: NAD HEENT: Normal. Neck: No JVD, no thyromegaly. Lungs: Clear to auscultation bilaterally with normal respiratory effort. CV: Regular rate and rhythm, normal S1/S2, no S3/S4, no murmur. No pretibial or periankle edema.  No carotid bruit.   Abdomen: Soft, nontender, no distention.  Neurologic: Alert and oriented.  Psych: Normal affect. Skin: Normal. Musculoskeletal: No gross deformities.    ECG: Most recent ECG reviewed.   Labs: Lab Results  Component Value Date/Time   K 4.3 02/09/2017 12:57 PM   BUN 10 02/09/2017 12:57 PM   BUN 10 12/23/2016 10:14 AM   CREATININE 0.73 02/09/2017 12:57 PM   CREATININE 0.91 03/12/2016 08:57 AM   ALT 17 02/09/2017 12:57 PM   TSH 2.192 02/09/2017 01:30 PM   TSH 1.780 12/23/2016 10:14 AM   HGB 14.4 02/09/2017 12:57 PM   HGB 12.9 06/05/2015 09:50 AM     Lipids: Lab Results  Component Value Date/Time   LDLCALC 91 05/25/2017 01:50 PM   LDLCALC 163 (H) 08/07/2016 08:32 AM   CHOL 172 05/25/2017 01:50 PM   CHOL 230 (H) 08/07/2016 08:32 AM   TRIG 193 (H) 05/25/2017 01:50 PM   HDL 42 05/25/2017 01:50 PM   HDL 42 08/07/2016 08:32 AM       ASSESSMENT AND PLAN:  1. Chest pressure with jaw pain and fatigue/angina pectoris:  While her nuclear stress test was low risk based on myocardial perfusion imaging, there were ECG abnormalities as noted above and she continues to experience symptoms. She did develop a hypertensive response to exercise.    She has agreed to  proceed with coronary angiography.   Risks and benefits of cardiac catheterization have been discussed with the patient.  These include bleeding, infection, kidney damage, stroke, heart attack, death.  The patient understands these risks and is willing to proceed.  2. Left calf crampswith cold feet:By physical examination, she has good arterial flow with normal pulses. I previously recommended some home remedies.  3. Hyperlipidemia: Lipid panel 05/25/17 showed total cholesterol 172, triglycerides 193, HDL 42, LDL 91.  Continue Zetia.  4. Question of TIA: She is on ASA and Zetia and is statin intolerant.  No evidence of atrial fibrillation or any other arrhythmias by cardiac monitoring.  5. Palpitations: Event monitor did not demonstrate any significant arrhythmias.  Symptoms correlated with sinus rhythm.  6.  Fatigue: Unclear whether she has sleep disordered breathing.  She does have a history of snoring but denies morning headaches.  We again talked about the possibility of a sleep study.    I will first wait to see what coronary angiography shows.     Disposition: Follow up 6 weeks.  Time spent: 40 minutes, of which greater than 50% was spent reviewing symptoms, relevant blood tests and studies, and discussing management plan with the patient.    Kate Sable, M.D., F.A.C.C.

## 2017-07-01 NOTE — Patient Instructions (Addendum)
Medication Instructions:  Continue all current medications.  Labwork: BMET, CBC, PT, PTT - orders given today.   Testing/Procedures:  A chest x-ray takes a picture of the organs and structures inside the chest, including the heart, lungs, and blood vessels. This test can show several things, including, whether the heart is enlarges; whether fluid is building up in the lungs; and whether pacemaker / defibrillator leads are still in place.  Your physician has requested that you have a cardiac catheterization. Cardiac catheterization is used to diagnose and/or treat various heart conditions. Doctors may recommend this procedure for a number of different reasons. The most common reason is to evaluate chest pain. Chest pain can be a symptom of coronary artery disease (CAD), and cardiac catheterization can show whether plaque is narrowing or blocking your heart's arteries. This procedure is also used to evaluate the valves, as well as measure the blood flow and oxygen levels in different parts of your heart. For further information please visit [email or web address]. Please follow instruction sheet, as given.  Follow-Up: 6 weeks   Any Other Special Instructions Will Be Listed Below (If Applicable).  If you need a refill on your cardiac medications before your next appointment, please call your pharmacy.

## 2017-07-01 NOTE — Telephone Encounter (Signed)
Pre-cert Verification for the following procedure   Left heart cath - Friday, 4/19 - 7:30 - Burt Knack

## 2017-07-13 ENCOUNTER — Other Ambulatory Visit (HOSPITAL_COMMUNITY)
Admission: RE | Admit: 2017-07-13 | Discharge: 2017-07-13 | Disposition: A | Discharge status: Home / Self Care | Payer: Medicare HMO | Source: Ambulatory Visit | Attending: Cardiovascular Disease | Admitting: Cardiovascular Disease

## 2017-07-13 ENCOUNTER — Ambulatory Visit (HOSPITAL_COMMUNITY)
Admission: RE | Admit: 2017-07-13 | Discharge: 2017-07-13 | Disposition: A | Discharge status: Home / Self Care | Payer: Medicare HMO | Source: Ambulatory Visit | Attending: Cardiovascular Disease | Admitting: Cardiovascular Disease

## 2017-07-13 DIAGNOSIS — Z01812 Encounter for preprocedural laboratory examination: Secondary | ICD-10-CM | POA: Diagnosis not present

## 2017-07-13 DIAGNOSIS — I209 Angina pectoris, unspecified: Secondary | ICD-10-CM | POA: Insufficient documentation

## 2017-07-13 DIAGNOSIS — Z01818 Encounter for other preprocedural examination: Secondary | ICD-10-CM | POA: Diagnosis not present

## 2017-07-13 LAB — BASIC METABOLIC PANEL
Anion gap: 10 (ref 5–15)
BUN: 14 mg/dL (ref 6–20)
CO2: 28 mmol/L (ref 22–32)
Calcium: 9.3 mg/dL (ref 8.9–10.3)
Chloride: 100 mmol/L — ABNORMAL LOW (ref 101–111)
Creatinine, Ser: 0.71 mg/dL (ref 0.44–1.00)
GFR calc Af Amer: >60 mL/min (ref >60)
GFR calc non Af Amer: >60 mL/min (ref >60)
Glucose, Bld: 105 mg/dL — ABNORMAL HIGH (ref 65–99)
Potassium: 4 mmol/L (ref 3.5–5.1)
Sodium: 138 mmol/L (ref 135–145)

## 2017-07-13 LAB — CBC
HCT: 42.4 % (ref 36.0–46.0)
Hemoglobin: 14 g/dL (ref 12.0–15.0)
MCH: 30 pg (ref 26.0–34.0)
MCHC: 33 g/dL (ref 30.0–36.0)
MCV: 91 fL (ref 78.0–100.0)
Platelets: 197 10*3/uL (ref 150–400)
RBC: 4.66 MIL/uL (ref 3.87–5.11)
RDW: 12.8 % (ref 11.5–15.5)
WBC: 6.2 10*3/uL (ref 4.0–10.5)

## 2017-07-13 LAB — PROTIME-INR
INR: 1
Prothrombin Time: 13.1 seconds (ref 11.4–15.2)

## 2017-07-13 LAB — APTT: aPTT: 32 seconds (ref 24–36)

## 2017-07-16 ENCOUNTER — Telehealth: Payer: Self-pay | Admitting: *Deleted

## 2017-07-16 NOTE — Telephone Encounter (Addendum)
Catheterization scheduled at [facility] for: Friday July 17, 2017 7:30 AM Arrival time and place: [place] [facility] at: 5:30 AM Nothing to eat or drink after midnight prior to cath. Verified allergies in Epic. Verified no diabetes medications.  Verified AM meds can be  taken pre-cath with sip of water including: ASA 81 mg   Confirmed patient has responsible person to drive home post procedure and observe patient for 24 hours:yes

## 2017-07-17 ENCOUNTER — Ambulatory Visit (HOSPITAL_COMMUNITY)
Admission: RE | Admit: 2017-07-17 | Discharge: 2017-07-17 | Disposition: A | Discharge status: Home / Self Care | Payer: Medicare HMO | Source: Ambulatory Visit | Attending: Cardiovascular Disease | Admitting: Cardiovascular Disease

## 2017-07-17 ENCOUNTER — Ambulatory Visit (HOSPITAL_COMMUNITY)
Admission: RE | Disposition: A | Discharge status: Home / Self Care | Payer: Medicare HMO | Source: Ambulatory Visit | Attending: Cardiovascular Disease

## 2017-07-17 ENCOUNTER — Encounter (HOSPITAL_COMMUNITY): Payer: Self-pay | Admitting: Cardiovascular Disease

## 2017-07-17 DIAGNOSIS — Z8673 Personal history of transient ischemic attack (TIA), and cerebral infarction without residual deficits: Secondary | ICD-10-CM | POA: Insufficient documentation

## 2017-07-17 DIAGNOSIS — I208 Other forms of angina pectoris: Secondary | ICD-10-CM | POA: Diagnosis not present

## 2017-07-17 DIAGNOSIS — Z888 Allergy status to other drugs, medicaments and biological substances status: Secondary | ICD-10-CM | POA: Diagnosis not present

## 2017-07-17 DIAGNOSIS — R002 Palpitations: Secondary | ICD-10-CM | POA: Diagnosis not present

## 2017-07-17 DIAGNOSIS — E039 Hypothyroidism, unspecified: Secondary | ICD-10-CM | POA: Diagnosis not present

## 2017-07-17 DIAGNOSIS — R6884 Jaw pain: Secondary | ICD-10-CM | POA: Diagnosis not present

## 2017-07-17 DIAGNOSIS — R252 Cramp and spasm: Secondary | ICD-10-CM | POA: Diagnosis not present

## 2017-07-17 DIAGNOSIS — R5383 Other fatigue: Secondary | ICD-10-CM | POA: Diagnosis not present

## 2017-07-17 DIAGNOSIS — R030 Elevated blood-pressure reading, without diagnosis of hypertension: Secondary | ICD-10-CM | POA: Insufficient documentation

## 2017-07-17 DIAGNOSIS — I5030 Unspecified diastolic (congestive) heart failure: Secondary | ICD-10-CM | POA: Diagnosis not present

## 2017-07-17 DIAGNOSIS — Z85828 Personal history of other malignant neoplasm of skin: Secondary | ICD-10-CM | POA: Diagnosis not present

## 2017-07-17 DIAGNOSIS — Z7982 Long term (current) use of aspirin: Secondary | ICD-10-CM | POA: Diagnosis not present

## 2017-07-17 DIAGNOSIS — I341 Nonrheumatic mitral (valve) prolapse: Secondary | ICD-10-CM | POA: Insufficient documentation

## 2017-07-17 DIAGNOSIS — E785 Hyperlipidemia, unspecified: Secondary | ICD-10-CM | POA: Insufficient documentation

## 2017-07-17 DIAGNOSIS — Z79899 Other long term (current) drug therapy: Secondary | ICD-10-CM | POA: Insufficient documentation

## 2017-07-17 DIAGNOSIS — Z79890 Hormone replacement therapy (postmenopausal): Secondary | ICD-10-CM | POA: Diagnosis not present

## 2017-07-17 DIAGNOSIS — Z882 Allergy status to sulfonamides status: Secondary | ICD-10-CM | POA: Insufficient documentation

## 2017-07-17 DIAGNOSIS — I209 Angina pectoris, unspecified: Secondary | ICD-10-CM

## 2017-07-17 DIAGNOSIS — I2089 Other forms of angina pectoris: Secondary | ICD-10-CM | POA: Diagnosis present

## 2017-07-17 HISTORY — PX: LEFT HEART CATH AND CORONARY ANGIOGRAPHY: CATH118249

## 2017-07-17 SURGERY — LEFT HEART CATH AND CORONARY ANGIOGRAPHY
Anesthesia: LOCAL

## 2017-07-17 MED ORDER — LIDOCAINE HCL (PF) 1 % IJ SOLN
INTRAMUSCULAR | Status: DC | PRN
  Administered 2017-07-17: 2 mL

## 2017-07-17 MED ORDER — FENTANYL CITRATE (PF) 100 MCG/2ML IJ SOLN
INTRAMUSCULAR | Status: DC | PRN
  Administered 2017-07-17 (×2): 25 ug via INTRAVENOUS

## 2017-07-17 MED ORDER — VERAPAMIL HCL 2.5 MG/ML IV SOLN
INTRAVENOUS | Status: AC
  Filled 2017-07-17: qty 2

## 2017-07-17 MED ORDER — HEPARIN SODIUM (PORCINE) 1000 UNIT/ML IJ SOLN
INTRAMUSCULAR | Status: DC | PRN
  Administered 2017-07-17: 3000 [IU] via INTRAVENOUS

## 2017-07-17 MED ORDER — MIDAZOLAM HCL 2 MG/2ML IJ SOLN
INTRAMUSCULAR | Status: DC | PRN
  Administered 2017-07-17 (×2): 1 mg via INTRAVENOUS

## 2017-07-17 MED ORDER — SODIUM CHLORIDE 0.9 % IV SOLN: 250.0000 mL | INTRAVENOUS | Status: DC | PRN

## 2017-07-17 MED ORDER — FENTANYL CITRATE (PF) 100 MCG/2ML IJ SOLN
INTRAMUSCULAR | Status: AC
  Filled 2017-07-17: qty 2

## 2017-07-17 MED ORDER — HEPARIN (PORCINE) IN NACL 2-0.9 UNITS/ML
INTRAMUSCULAR | Status: AC | PRN
  Administered 2017-07-17 (×2): 500 mL

## 2017-07-17 MED ORDER — SODIUM CHLORIDE 0.9% FLUSH: 3.0000 mL | Freq: Two times a day (BID) | INTRAVENOUS | Status: DC

## 2017-07-17 MED ORDER — HEPARIN (PORCINE) IN NACL 1000-0.9 UT/500ML-% IV SOLN
INTRAVENOUS | Status: AC
  Filled 2017-07-17: qty 1000

## 2017-07-17 MED ORDER — VERAPAMIL HCL 2.5 MG/ML IV SOLN
INTRAVENOUS | Status: DC | PRN
  Administered 2017-07-17: 09:00:00 via INTRA_ARTERIAL

## 2017-07-17 MED ORDER — SODIUM CHLORIDE 0.9 % WEIGHT BASED INFUSION: 1.0000 mL/kg/h | INTRAVENOUS | Status: DC

## 2017-07-17 MED ORDER — ACETAMINOPHEN 325 MG PO TABS: 650.0000 mg | ORAL_TABLET | ORAL | Status: DC | PRN

## 2017-07-17 MED ORDER — MIDAZOLAM HCL 2 MG/2ML IJ SOLN
INTRAMUSCULAR | Status: AC
  Filled 2017-07-17: qty 2

## 2017-07-17 MED ORDER — HEPARIN SODIUM (PORCINE) 1000 UNIT/ML IJ SOLN
INTRAMUSCULAR | Status: AC
  Filled 2017-07-17: qty 1

## 2017-07-17 MED ORDER — IOHEXOL 350 MG/ML SOLN
INTRAVENOUS | Status: DC | PRN
  Administered 2017-07-17: 84 mL via INTRAVENOUS

## 2017-07-17 MED ORDER — ASPIRIN 81 MG PO CHEW: 81.0000 mg | CHEWABLE_TABLET | ORAL | Status: DC

## 2017-07-17 MED ORDER — SODIUM CHLORIDE 0.9 % WEIGHT BASED INFUSION
3.0000 mL/kg/h | INTRAVENOUS | Status: AC
  Administered 2017-07-17: 3 mL/kg/h via INTRAVENOUS

## 2017-07-17 MED ORDER — SODIUM CHLORIDE 0.9% FLUSH: 3.0000 mL | INTRAVENOUS | Status: DC | PRN

## 2017-07-17 MED ORDER — LIDOCAINE HCL (PF) 1 % IJ SOLN
INTRAMUSCULAR | Status: AC
  Filled 2017-07-17: qty 30

## 2017-07-17 MED ORDER — ONDANSETRON HCL 4 MG/2ML IJ SOLN: 4.0000 mg | Freq: Four times a day (QID) | INTRAMUSCULAR | Status: DC | PRN

## 2017-07-17 SURGICAL SUPPLY — 11 items
BAND ZEPHYR COMPRESS 30 LONG (HEMOSTASIS) ×2 IMPLANT
CATH INFINITI 5 FR JL3.5 (CATHETERS) ×2 IMPLANT
CATH INFINITI JR4 5F (CATHETERS) ×2 IMPLANT
GUIDEWIRE INQWIRE 1.5J.035X260 (WIRE) ×1 IMPLANT
INQWIRE 1.5J .035X260CM (WIRE) ×2
KIT HEART LEFT (KITS) ×2 IMPLANT
NEEDLE PERC 21GX4CM (NEEDLE) ×2 IMPLANT
PACK CARDIAC CATHETERIZATION (CUSTOM PROCEDURE TRAY) ×2 IMPLANT
SHEATH RAIN RADIAL 21G 6FR (SHEATH) ×2 IMPLANT
TRANSDUCER W/STOPCOCK (MISCELLANEOUS) ×2 IMPLANT
TUBING CIL FLEX 10 FLL-RA (TUBING) ×2 IMPLANT

## 2017-07-17 NOTE — Interval H&P Note (Signed)
Cath Lab Visit (complete for each Cath Lab visit)  Clinical Evaluation Leading to the Procedure:   ACS: No.  Non-ACS:    Anginal Classification: CCS III  Anti-ischemic medical therapy: No Therapy  Non-Invasive Test Results: Low-risk stress test findings: cardiac mortality <1%/year  Prior CABG: No previous CABG      History and Physical Interval Note:  07/17/2017 7:54 AM  Grace [name]  has presented today for surgery, with the diagnosis of angina  The various methods of treatment have been discussed with the patient and family. After consideration of risks, benefits and other options for treatment, the patient has consented to  Procedure(s): LEFT HEART CATH AND CORONARY ANGIOGRAPHY (N/A) as a surgical intervention .  The patient's history has been reviewed, patient examined, no change in status, stable for surgery.  I have reviewed the patient's chart and labs.  Questions were answered to the patient's satisfaction.     Sherren Mocha

## 2017-07-17 NOTE — Discharge Instructions (Signed)
**Note Grace [name]-identified via Obfuscation** Radial Site Care °Refer to this sheet in the next few weeks. These instructions provide you with information about caring for yourself after your procedure. Your health care provider may also give you more specific instructions. Your treatment has been planned according to current medical practices, but problems sometimes occur. Call your health care provider if you have any problems or questions after your procedure. °What can I expect after the procedure? °After your procedure, it is typical to have the following: °· Bruising at the radial site that usually fades within 1-2 weeks. °· Blood collecting in the tissue (hematoma) that may be painful to the touch. It should usually decrease in size and tenderness within 1-2 weeks. ° °Follow these instructions at home: °· Take medicines only as directed by your health care provider. °· You may shower 24-48 hours after the procedure or as directed by your health care provider. Remove the bandage (dressing) and gently wash the site with plain soap and water. Pat the area dry with a clean towel. Do not rub the site, because this may cause bleeding. °· Do not take baths, swim, or use a hot tub until your health care provider approves. °· Check your insertion site every day for redness, swelling, or drainage. °· Do not apply powder or lotion to the site. °· Do not flex or bend the affected arm for 24 hours or as directed by your health care provider. °· Do not push or pull heavy objects with the affected arm for 24 hours or as directed by your health care provider. °· Do not lift over 10 lb (4.5 kg) for 5 days after your procedure or as directed by your health care provider. °· Ask your health care provider when it is okay to: °? Return to work or school. °? Resume usual physical activities or sports. °? Resume sexual activity. °· Do not drive home if you are discharged the same day as the procedure. Have someone else drive you. °· You may drive 24 hours after the procedure  unless otherwise instructed by your health care provider. °· Do not operate machinery or power tools for 24 hours after the procedure. °· If your procedure was done as an outpatient procedure, which means that you went home the same day as your procedure, a responsible adult should be with you for the first 24 hours after you arrive home. °· Keep all follow-up visits as directed by your health care provider. This is important. °Contact a health care provider if: °· You have a fever. °· You have chills. °· You have increased bleeding from the radial site. Hold pressure on the site. °Get help right away if: °· You have unusual pain at the radial site. °· You have redness, warmth, or swelling at the radial site. °· You have drainage (other than a small amount of blood on the dressing) from the radial site. °· The radial site is bleeding, and the bleeding does not stop after 30 minutes of holding steady pressure on the site. °· Your arm or hand becomes pale, cool, tingly, or numb. °This information is not intended to replace advice given to you by your health care provider. Make sure you discuss any questions you have with your health care provider. °Document Released: 04/19/2010 Document Revised: 08/23/2015 Document Reviewed: 10/03/2013 °Elsevier Interactive Patient Education © 2018 Elsevier Inc[employer] ° °

## 2017-07-20 MED FILL — Heparin Sod (Porcine)-NaCl IV Soln 1000 Unit/500ML-0.9%: INTRAVENOUS | Qty: 500 | Status: AC

## 2017-07-24 NOTE — Research (Signed)
LATE ENTRY  CAD FEM Informed Consent           Subject Name:  Grace [name]    Subject met inclusion and exclusion criteria.  The informed consent form, study requirements and expectations were reviewed with the subject and questions and concerns were addressed prior to the signing of the consent form.  The subject verbalized understanding of the trial requirements.  The subject agreed to participate in the CAD FEM trial and signed the informed consent.  The informed consent was obtained prior to performance of any protocol-specific procedures for the subject.  A copy of the signed informed consent was given to the subject and a copy was placed in the subject's medical record.This patient was consented by Berneda Rose on 07-17-2017 at 7:34 a.m.   [place] Matilynn Dacey, Research Assistant  07/17/2017 12:00p.m.

## 2017-08-04 ENCOUNTER — Encounter: Payer: Self-pay | Admitting: Cardiovascular Disease

## 2017-08-04 ENCOUNTER — Ambulatory Visit: Payer: Medicare HMO | Admitting: Cardiovascular Disease

## 2017-08-04 VITALS — BP 122/62 | HR 71 | Ht 62.0 in | Wt 129.0 lb

## 2017-08-04 DIAGNOSIS — R002 Palpitations: Secondary | ICD-10-CM

## 2017-08-04 DIAGNOSIS — M25475 Effusion, left foot: Secondary | ICD-10-CM

## 2017-08-04 DIAGNOSIS — M25471 Effusion, right ankle: Secondary | ICD-10-CM

## 2017-08-04 DIAGNOSIS — M25476 Effusion, unspecified foot: Secondary | ICD-10-CM | POA: Diagnosis not present

## 2017-08-04 DIAGNOSIS — E782 Mixed hyperlipidemia: Secondary | ICD-10-CM | POA: Diagnosis not present

## 2017-08-04 DIAGNOSIS — M25472 Effusion, left ankle: Secondary | ICD-10-CM

## 2017-08-04 DIAGNOSIS — Z8673 Personal history of transient ischemic attack (TIA), and cerebral infarction without residual deficits: Secondary | ICD-10-CM | POA: Diagnosis not present

## 2017-08-04 DIAGNOSIS — R079 Chest pain, unspecified: Secondary | ICD-10-CM

## 2017-08-04 DIAGNOSIS — R209 Unspecified disturbances of skin sensation: Secondary | ICD-10-CM

## 2017-08-04 DIAGNOSIS — R252 Cramp and spasm: Secondary | ICD-10-CM

## 2017-08-04 DIAGNOSIS — M25473 Effusion, unspecified ankle: Secondary | ICD-10-CM | POA: Diagnosis not present

## 2017-08-04 DIAGNOSIS — R5383 Other fatigue: Secondary | ICD-10-CM | POA: Diagnosis not present

## 2017-08-04 NOTE — Progress Notes (Signed)
SUBJECTIVE: The patient presents for follow-up after undergoing coronary angiography on 07/17/2017 which demonstrated angiographically normal coronary arteries and normal LVEDP.  She previously wore a cardiac monitor which demonstrated sinus rhythm with no arrhythmias. Her symptoms correlated with sinus rhythm.  She has had occasional chest pains and her friend, Zella Ball Pergola), thinks it is due to anxiety.  She has occasional palpitations.  She does have bilateral ankle and feet swelling which occurs intermittently.  She has intermittent feet cramping with tingling and numbness.  She denies orthopnea and paroxysmal nocturnal dyspnea.  She denies claudication pain.  She had several questions about vitamin supplements and fish oil.  She is planning on going on a trip to the beach with her daughter to their beach home in 2 weeks.   Review of Systems: As per "subjective", otherwise negative.  Allergies  Allergen Reactions  . Compazine Other (See Comments)    Slurred speech, unsure of other reaction  . Statins Other (See Comments)    myalgias  . Sulfa Drugs Cross Reactors Rash    Current Outpatient Medications  Medication Sig Dispense Refill  . ALPRAZolam (XANAX) 0.25 MG tablet 1/2 or a whole twice a day (Patient taking differently: Take 0.25 mg by mouth 2 (two) times daily as needed for anxiety. ) 30 tablet 0  . aspirin 81 MG tablet Take 1 tablet (81 mg total) daily by mouth.    . cholecalciferol (VITAMIN D) 1000 units tablet Take 1,000 Units by mouth daily.     . Cyanocobalamin (VITAMIN B-12 PO) Take 1,500 mcg by mouth daily.     Marland Kitchen ezetimibe (ZETIA) 10 MG tablet Take 10 mg by mouth daily.    Marland Kitchen levothyroxine (SYNTHROID, LEVOTHROID) 75 MCG tablet TAKE 1 TABLET TUESDAY TO   SUNDAY AND 1/2 TABLET ON   MONDAY (Patient taking differently: Take 37.5-75 mcg by mouth See admin instructions. TAKE 1 TABLET TUESDAY TO   SUNDAY AND 1/2 TABLET ON   MONDAY) 88 tablet 1   No current  facility-administered medications for this visit.     Past Medical History:  Diagnosis Date  . Cancer (HCC)    Skin Cancer, Squamous Cell  . Diastolic dysfunction 96/78/9381   Ejection fraction 75%.  . Dry eyes, bilateral   . Family history of anesthesia complication    Mother was hard to wake up after anesthesia  . Frequency of urination   . Heart murmur   . Hyperlipidemia    Diet controlled  . Hypothyroid   . Migraine 2013  . Mitral valve prolapse   . Stroke [facility])    3 episodes in past yr  . TIA (transient ischemic attack) 02/19/2012    Past Surgical History:  Procedure Laterality Date  . BALLOON DILATION N/A 07/28/2013   Procedure: BALLOON DILATION;  Surgeon: Rogene Houston, MD;  [facility];  Service: Endoscopy;  Laterality: N/A;  . BREAST BIOPSY     left benign tumor  . CATARACT EXTRACTION W/PHACO Right 07/11/2013   Procedure: CATARACT EXTRACTION PHACO AND INTRAOCULAR LENS PLACEMENT (IOC);  Surgeon: Tonny Branch, MD;  [facility]: [facility];  Service: Ophthalmology;  Laterality: Right;  CDE 9.99  . CATARACT EXTRACTION W/PHACO Left 08/04/2013   Procedure: CATARACT EXTRACTION PHACO AND INTRAOCULAR LENS PLACEMENT (IOC);  Surgeon: Tonny Branch, MD;  Location: [facility];  Service: Ophthalmology;  Laterality: Left;  CDE:16.26  . CHOLECYSTECTOMY    . COLONOSCOPY  12/18/2010   Procedure: COLONOSCOPY;  Surgeon: Rogene Houston,  MD;  [facility];  Service: Endoscopy;  Laterality: N/A;  1:00 pm  . COLONOSCOPY N/A 03/06/2016   Procedure: COLONOSCOPY;  Surgeon: Rogene Houston, MD;  [facility];  Service: Endoscopy;  Laterality: N/A;  1200  . ESOPHAGOGASTRODUODENOSCOPY    . ESOPHAGOGASTRODUODENOSCOPY N/A 07/28/2013   Procedure: ESOPHAGOGASTRODUODENOSCOPY (EGD);  Surgeon: Rogene Houston, MD;  [facility];  Service: Endoscopy;  Laterality: N/A;  125  . LEFT HEART CATH AND CORONARY ANGIOGRAPHY N/A 07/17/2017   Procedure: LEFT HEART CATH AND CORONARY  ANGIOGRAPHY;  Surgeon: Sherren Mocha, MD;  Location: [place] [facility];  Service: Cardiovascular;  Laterality: N/A;  . MALONEY DILATION N/A 07/28/2013   Procedure: Venia Minks DILATION;  Surgeon: Rogene Houston, MD;  [facility];  Service: Endoscopy;  Laterality: N/A;  . SAVORY DILATION N/A 07/28/2013   Procedure: SAVORY DILATION;  Surgeon: Rogene Houston, MD;  [facility];  Service: Endoscopy;  Laterality: N/A;  . THYROIDECTOMY  11/20/2011   Procedure: THYROIDECTOMY;  Surgeon: Izora Gala, MD;  Location: [place];  Service: ENT;  Laterality: Left;  LEFT THYROID LOBECTOMY  . TUBAL LIGATION     38 yrs ago.    Social History   Socioeconomic History  . Marital status: Widowed    Spouse name: Not on file  . Number of children: Not on file  . Years of education: Not on file  . Highest education level: Not on file  Occupational History  . Not on file  Social Needs  . Financial resource strain: Not on file  . Food insecurity:    Worry: Not on file    Inability: Not on file  . Transportation needs:    Medical: Not on file    Non-medical: Not on file  Tobacco Use  . Smoking status: Never Smoker  . Smokeless tobacco: Never Used  Substance and Sexual Activity  . Alcohol use: No    Alcohol/week: 0.0 oz  . Drug use: No  . Sexual activity: Not Currently  Lifestyle  . Physical activity:    Days per week: Not on file    Minutes per session: Not on file  . Stress: Not on file  Relationships  . Social connections:    Talks on phone: Not on file    Gets together: Not on file    Attends religious service: Not on file    Active member of club or organization: Not on file    Attends meetings of clubs or organizations: Not on file    Relationship status: Not on file  . Intimate partner violence:    Fear of current or ex partner: Not on file    Emotionally abused: Not on file    Physically abused: Not on file    Forced sexual activity: Not on file  Other Topics  Concern  . Not on file  Social History Narrative   Lives alone   caffeine- one soda daily, coffee, 1/2 cup   2 children   High school     Vitals:   08/04/17 1553  BP: 122/62  Pulse: 71  SpO2: 97%  Weight: 129 lb (58.5 kg)  Height: 5\' 2"  (1.575 m)    Wt Readings from Last 3 Encounters:  08/04/17 129 lb (58.5 kg)  07/17/17 132 lb (59.9 kg)  07/01/17 130 lb (59 kg)     PHYSICAL EXAM General: NAD HEENT: Normal. Neck: No JVD, no thyromegaly. Lungs: Clear to auscultation bilaterally  with normal respiratory effort. CV: Regular rate and rhythm, normal S1/S2, no S3/S4, no murmur. No pretibial or periankle edema.  Bilateral venous varicosities noted. Abdomen: Soft, nontender, no distention.  Neurologic: Alert and oriented.  Psych: Normal affect. Skin: Normal. Musculoskeletal: No gross deformities.    ECG: Most recent ECG reviewed.   Labs: Lab Results  Component Value Date/Time   K 4.0 07/13/2017 08:33 AM   BUN 14 07/13/2017 08:33 AM   BUN 10 12/23/2016 10:14 AM   CREATININE 0.71 07/13/2017 08:33 AM   CREATININE 0.91 03/12/2016 08:57 AM   ALT 17 02/09/2017 12:57 PM   TSH 2.192 02/09/2017 01:30 PM   TSH 1.780 12/23/2016 10:14 AM   HGB 14.0 07/13/2017 08:33 AM   HGB 12.9 06/05/2015 09:50 AM     Lipids: Lab Results  Component Value Date/Time   LDLCALC 91 05/25/2017 01:50 PM   LDLCALC 163 (H) 08/07/2016 08:32 AM   CHOL 172 05/25/2017 01:50 PM   CHOL 230 (H) 08/07/2016 08:32 AM   TRIG 193 (H) 05/25/2017 01:50 PM   HDL 42 05/25/2017 01:50 PM   HDL 42 08/07/2016 08:32 AM       ASSESSMENT AND PLAN:  1. Chest pressure: Noncardiac in etiology with normal coronary angiography as noted above.  May be related to anxiety.  2. Left calf crampswith cold feet and bilateral ankle swelling with venous varicosities and occasional tingling/numbness:By physical examination, she has good arterial flow with normal pulses.  She does have venous varicosities.  I recommended  salt avoidance.  3. Hyperlipidemia: Lipid panel 05/25/17 showed total cholesterol 172, triglycerides 193, HDL 42, LDL 91.  Continue Zetia.  4. Reported history of TIA: She is on ASA and Zetia and is statin intolerant.No evidence of atrial fibrillation or any other arrhythmias by cardiac monitoring.  5. Palpitations:Event monitor did not demonstrate any significant arrhythmias. Symptoms correlated with sinus rhythm.  Symptoms may be related to anxiety.  I will continue to monitor.  6. Fatigue: Unclear whether she has sleep disordered breathing. She does have a history of snoring but denies morning headaches. We  previously talked about the possibility of a sleep study.     Disposition: Follow up 3 months   Kate Sable, M.D., F.A.C.C.

## 2017-08-04 NOTE — Patient Instructions (Addendum)
Your physician wants you to follow-up in:  End of july with Dr.Koneswaran You will receive a reminder letter in the mail two months in advance. If you don't receive a letter, please call our office to schedule the follow-up appointment.     Your physician recommends that you continue on your current medications as directed. Please refer to the Current Medication list given to you today.   If you need a refill on your cardiac medications before your next appointment, please call your pharmacy.     No lab work or tests ordered today      Thank you for choosing [facility] !

## 2017-08-31 ENCOUNTER — Telehealth: Payer: Self-pay | Admitting: Family Medicine

## 2017-08-31 MED ORDER — LEVOTHYROXINE SODIUM 75 MCG PO TABS: ORAL_TABLET | ORAL | 0 refills | Status: DC

## 2017-08-31 NOTE — Telephone Encounter (Signed)
Prescription sent electronically to pharmacy. Patient notified. 

## 2017-08-31 NOTE — Telephone Encounter (Signed)
Patient is requesting refill for levothyroxine (SYNTHROID, LEVOTHROID) 75 MCG tablet.  She said she has about a week remaining.  [place] [facility]

## 2017-09-15 ENCOUNTER — Encounter: Payer: Self-pay | Admitting: Cardiovascular Disease

## 2017-09-16 DIAGNOSIS — H26492 Other secondary cataract, left eye: Secondary | ICD-10-CM | POA: Diagnosis not present

## 2017-10-03 ENCOUNTER — Other Ambulatory Visit: Payer: Self-pay | Admitting: Cardiovascular Disease

## 2017-10-21 DIAGNOSIS — H0012 Chalazion right lower eyelid: Secondary | ICD-10-CM | POA: Diagnosis not present

## 2017-10-21 DIAGNOSIS — H02889 Meibomian gland dysfunction of unspecified eye, unspecified eyelid: Secondary | ICD-10-CM | POA: Diagnosis not present

## 2017-10-21 DIAGNOSIS — Z961 Presence of intraocular lens: Secondary | ICD-10-CM | POA: Diagnosis not present

## 2017-10-21 DIAGNOSIS — H01001 Unspecified blepharitis right upper eyelid: Secondary | ICD-10-CM | POA: Diagnosis not present

## 2017-10-26 ENCOUNTER — Ambulatory Visit: Payer: Medicare HMO | Admitting: Cardiovascular Disease

## 2017-11-13 ENCOUNTER — Other Ambulatory Visit (HOSPITAL_COMMUNITY)
Admission: RE | Admit: 2017-11-13 | Discharge: 2017-11-13 | Disposition: A | Discharge status: Home / Self Care | Payer: Medicare HMO | Source: Ambulatory Visit | Attending: Family Medicine | Admitting: Family Medicine

## 2017-11-13 ENCOUNTER — Other Ambulatory Visit: Payer: Self-pay

## 2017-11-13 ENCOUNTER — Encounter: Payer: Self-pay | Admitting: Family Medicine

## 2017-11-13 ENCOUNTER — Ambulatory Visit: Payer: Medicare HMO | Admitting: Family Medicine

## 2017-11-13 VITALS — BP 118/76 | Temp 97.9°F | Ht 62.0 in | Wt 124.8 lb

## 2017-11-13 DIAGNOSIS — K2900 Acute gastritis without bleeding: Secondary | ICD-10-CM

## 2017-11-13 DIAGNOSIS — R1084 Generalized abdominal pain: Secondary | ICD-10-CM | POA: Insufficient documentation

## 2017-11-13 LAB — HEPATIC FUNCTION PANEL
ALT: 19 U/L (ref 0–44)
AST: 26 U/L (ref 15–41)
Albumin: 4.3 g/dL (ref 3.5–5.0)
Alkaline Phosphatase: 52 U/L (ref 38–126)
Bilirubin, Direct: 0.2 mg/dL (ref 0.0–0.2)
Indirect Bilirubin: 1.1 mg/dL — ABNORMAL HIGH (ref 0.3–0.9)
Total Bilirubin: 1.3 mg/dL — ABNORMAL HIGH (ref 0.3–1.2)
Total Protein: 7.4 g/dL (ref 6.5–8.1)

## 2017-11-13 LAB — CBC WITH DIFFERENTIAL/PLATELET
Basophils Absolute: 0 10*3/uL (ref 0.0–0.1)
Basophils Relative: 1 %
Eosinophils Absolute: 0.1 10*3/uL (ref 0.0–0.7)
Eosinophils Relative: 2 %
HCT: 42.1 % (ref 36.0–46.0)
Hemoglobin: 14 g/dL (ref 12.0–15.0)
Lymphocytes Relative: 28 %
Lymphs Abs: 2 10*3/uL (ref 0.7–4.0)
MCH: 30.6 pg (ref 26.0–34.0)
MCHC: 33.3 g/dL (ref 30.0–36.0)
MCV: 92.1 fL (ref 78.0–100.0)
Monocytes Absolute: 0.7 10*3/uL (ref 0.1–1.0)
Monocytes Relative: 10 %
Neutro Abs: 4.2 10*3/uL (ref 1.7–7.7)
Neutrophils Relative %: 59 %
Platelets: 194 10*3/uL (ref 150–400)
RBC: 4.57 MIL/uL (ref 3.87–5.11)
RDW: 12.6 % (ref 11.5–15.5)
WBC: 7 10*3/uL (ref 4.0–10.5)

## 2017-11-13 LAB — BASIC METABOLIC PANEL
Anion gap: 6 (ref 5–15)
BUN: 11 mg/dL (ref 8–23)
CO2: 29 mmol/L (ref 22–32)
Calcium: 9.1 mg/dL (ref 8.9–10.3)
Chloride: 103 mmol/L (ref 98–111)
Creatinine, Ser: 0.69 mg/dL (ref 0.44–1.00)
GFR calc Af Amer: >60 mL/min (ref >60)
GFR calc non Af Amer: >60 mL/min (ref >60)
Glucose, Bld: 86 mg/dL (ref 70–99)
Potassium: 4 mmol/L (ref 3.5–5.1)
Sodium: 138 mmol/L (ref 135–145)

## 2017-11-13 LAB — AMYLASE: Amylase: 40 U/L (ref 28–100)

## 2017-11-13 LAB — LIPASE, BLOOD: Lipase: 23 U/L (ref 11–51)

## 2017-11-13 MED ORDER — PANTOPRAZOLE SODIUM 40 MG PO TBEC: DELAYED_RELEASE_TABLET | ORAL | 2 refills | Status: DC

## 2017-11-13 MED ORDER — SUCRALFATE 1 G PO TABS: ORAL_TABLET | ORAL | 1 refills | Status: DC

## 2017-11-13 MED ORDER — ONDANSETRON 4 MG PO TBDP: ORAL_TABLET | ORAL | 0 refills | Status: DC

## 2017-11-13 NOTE — Progress Notes (Signed)
   Subjective:    Patient ID: Grace [name], female    DOB: December 30, 1945, 72 y.o.   MRN: [id number]  HPI  Patient arrives with abdominal pain and nausea for 3 days.  Three days ago was outside working outside , started feeling weak and dizy     Got to feeeling weak   Sick and nauaseated  appetitt not a  Good   Feels weak  Not much energy    Took some tylenol  And pepto bismol      Upper mid abd uncomfortable   With rad to ribs   No recent x of heartburn, has had in the past but ot lately  Pt hs  ahad g  b excisio     Review of Systems No headache, no major weight loss or weight gain, no chest pain no back pain abdominal pain no change in bowel habits complete ROS otherwise negative     Objective:   Physical Exam   Alert and oriented, vitals reviewed and stable, NAD ENT-TM's and ext canals WNL bilat via otoscopic exam Soft palate, tonsils and post pharynx WNL via oropharyngeal exam Neck-symmetric, no masses; thyroid nonpalpable and nontender Pulmonary-no tachypnea or accessory muscle use; Clear without wheezes via auscultation Card--no abnrml murmurs, rhythm reg and rate WNL Carotid pulses symmetric, without bruits Abdominal exam.  Positive substantial epigastric tenderness.  No rebound no guarding.  Excellent bowel sounds.  Diffuse mild discomfort elsewhere     Assessment & Plan:  Impression probable acute gastritis with element of nausea and epigastric discomfort.  Proton pump inhibitor initiated.  Carafate initiated.  This was all done after stat blood work was ordered.  White blood count returned normal.  Amylase lipase returned normal.  Follow-up with Dr. Nicki Reaper numerous questions answered  Greater than 50% of this 25 minute face to face visit was spent in counseling and discussion and coordination of care regarding the above diagnosis/diagnosies

## 2017-11-17 ENCOUNTER — Telehealth: Payer: Self-pay | Admitting: Family Medicine

## 2017-11-17 ENCOUNTER — Other Ambulatory Visit: Payer: Self-pay | Admitting: *Deleted

## 2017-11-17 MED ORDER — LEVOTHYROXINE SODIUM 75 MCG PO TABS: ORAL_TABLET | ORAL | 0 refills | Status: DC

## 2017-11-17 NOTE — Telephone Encounter (Signed)
Med sent to pharm. Pt notified on voicemail.  

## 2017-11-17 NOTE — Telephone Encounter (Signed)
Pt requesting refill on levothyroxine (SYNTHROID, LEVOTHROID) 75 MCG tablet. Please send 90 day supply to [facility], [place]

## 2017-12-01 ENCOUNTER — Encounter: Payer: Self-pay | Admitting: Family Medicine

## 2017-12-01 DIAGNOSIS — Z1231 Encounter for screening mammogram for malignant neoplasm of breast: Secondary | ICD-10-CM | POA: Diagnosis not present

## 2017-12-02 ENCOUNTER — Telehealth: Payer: Self-pay | Admitting: Family Medicine

## 2017-12-02 NOTE — Telephone Encounter (Signed)
I filled out the authorization as best I can for the physician orders for infusion of Reclast There is some additional information for you to fill and please

## 2017-12-04 ENCOUNTER — Encounter: Payer: Self-pay | Admitting: Family Medicine

## 2017-12-04 ENCOUNTER — Ambulatory Visit: Payer: Medicare HMO | Admitting: Family Medicine

## 2017-12-04 VITALS — BP 124/72 | Temp 98.6°F | Ht 62.0 in | Wt 124.0 lb

## 2017-12-04 DIAGNOSIS — Z780 Asymptomatic menopausal state: Secondary | ICD-10-CM

## 2017-12-04 DIAGNOSIS — E038 Other specified hypothyroidism: Secondary | ICD-10-CM | POA: Diagnosis not present

## 2017-12-04 DIAGNOSIS — K588 Other irritable bowel syndrome: Secondary | ICD-10-CM

## 2017-12-04 DIAGNOSIS — R1013 Epigastric pain: Secondary | ICD-10-CM | POA: Diagnosis not present

## 2017-12-04 MED ORDER — ONDANSETRON 4 MG PO TBDP: ORAL_TABLET | ORAL | 2 refills | Status: DC

## 2017-12-04 NOTE — Progress Notes (Signed)
   Subjective:    Patient ID: Grace [name], female    DOB: [birth year], 72 y.o.   MRN: [id number]  Abdominal Pain  This is a new problem. Episode onset: one month. Associated symptoms include anorexia, constipation and nausea. Pertinent negatives include no headaches or vomiting. Treatments tried: protonix.   Wants to get thyroid checked because its getting hard to swallow.  Patient relates a lot of fatigue tiredness feeling rundown she wonders if her thyroid medication is off  She also relates some intermittent dysphagia with stopped she also relates a lot of epigastric pain mid abdominal pain despite using PPI she has not been using the Carafate she denies any excessive dietary changes denies rectal bleeding or hematemesis no chest tightness pressure pain or shortness of breath.  Patient is also due for bone density testing.   Review of Systems  Constitutional: Negative for activity change, appetite change and fatigue.  HENT: Negative for congestion.   Respiratory: Negative for cough.   Cardiovascular: Negative for chest pain.  Gastrointestinal: Positive for abdominal pain, anorexia, constipation and nausea. Negative for vomiting.  Skin: Negative for color change.  Neurological: Negative for headaches.  Psychiatric/Behavioral: Negative for behavioral problems.       Objective:   Physical Exam  Constitutional: She appears well-nourished. No distress.  HENT:  Head: Normocephalic and atraumatic.  Eyes: Right eye exhibits no discharge. Left eye exhibits no discharge.  Neck: No tracheal deviation present.  Cardiovascular: Normal rate, regular rhythm and normal heart sounds.  No murmur heard. Pulmonary/Chest: Effort normal and breath sounds normal. No respiratory distress.  Musculoskeletal: She exhibits no edema.  Lymphadenopathy:    She has no cervical adenopathy.  Neurological: She is alert. Coordination normal.  Skin: Skin is warm and dry.  Psychiatric: She has a normal mood  and affect. Her behavior is normal.  Vitals reviewed.         Assessment & Plan:  Hypothyroidism check TSH continue levothyroxine Dyspepsia with intermittent dysphagia referral to GI will need endoscopy. If she has ongoing abdominal pain despite normal EGD been the next step would be is to consider doing a CAT scan Patient to follow-up within 3 months sooner problems Lab work ordered Bone density ordered

## 2017-12-04 NOTE — Telephone Encounter (Signed)
Sent order to [facility] short stay via email (fax failed) Danley Danker

## 2017-12-07 ENCOUNTER — Encounter (HOSPITAL_COMMUNITY)
Admission: RE | Admit: 2017-12-07 | Discharge: 2017-12-07 | Disposition: A | Discharge status: Home / Self Care | Payer: Medicare HMO | Source: Ambulatory Visit | Attending: Family Medicine | Admitting: Family Medicine

## 2017-12-07 ENCOUNTER — Encounter (HOSPITAL_COMMUNITY): Payer: Self-pay

## 2017-12-07 DIAGNOSIS — M810 Age-related osteoporosis without current pathological fracture: Secondary | ICD-10-CM | POA: Diagnosis not present

## 2017-12-07 MED ORDER — ZOLEDRONIC ACID 5 MG/100ML IV SOLN
5.0000 mg | Freq: Once | INTRAVENOUS | Status: AC
Start: 2017-12-07 — End: 2017-12-07
  Administered 2017-12-07: 5 mg via INTRAVENOUS

## 2017-12-07 MED ORDER — SODIUM CHLORIDE 0.9 % IV SOLN
Freq: Once | INTRAVENOUS | Status: AC
  Administered 2017-12-07: 09:00:00 via INTRAVENOUS

## 2017-12-07 MED ORDER — ZOLEDRONIC ACID 5 MG/100ML IV SOLN
INTRAVENOUS | Status: AC
  Filled 2017-12-07: qty 100

## 2017-12-08 LAB — H PYLORI, IGM, IGG, IGA AB
H pylori, IgM Abs: <9 units (ref 0.0–8.9)
H. pylori, IgA Abs: <9 units (ref 0.0–8.9)
H. pylori, IgG AbS: 0.41 Index Value (ref 0.00–0.79)

## 2017-12-08 LAB — TSH: TSH: 2.07 u[IU]/mL (ref 0.450–4.500)

## 2017-12-09 ENCOUNTER — Ambulatory Visit (HOSPITAL_COMMUNITY)
Admission: RE | Admit: 2017-12-09 | Discharge: 2017-12-09 | Disposition: A | Discharge status: Home / Self Care | Payer: Medicare HMO | Source: Ambulatory Visit | Attending: Family Medicine | Admitting: Family Medicine

## 2017-12-09 DIAGNOSIS — Z780 Asymptomatic menopausal state: Secondary | ICD-10-CM | POA: Diagnosis not present

## 2017-12-09 DIAGNOSIS — M810 Age-related osteoporosis without current pathological fracture: Secondary | ICD-10-CM | POA: Insufficient documentation

## 2017-12-10 ENCOUNTER — Encounter: Payer: Self-pay | Admitting: Family Medicine

## 2017-12-17 ENCOUNTER — Ambulatory Visit (INDEPENDENT_AMBULATORY_CARE_PROVIDER_SITE_OTHER): Payer: Medicare HMO | Admitting: Internal Medicine

## 2017-12-22 ENCOUNTER — Encounter (INDEPENDENT_AMBULATORY_CARE_PROVIDER_SITE_OTHER): Payer: Self-pay | Admitting: Internal Medicine

## 2017-12-22 ENCOUNTER — Encounter (INDEPENDENT_AMBULATORY_CARE_PROVIDER_SITE_OTHER): Payer: Self-pay | Admitting: *Deleted

## 2017-12-22 ENCOUNTER — Ambulatory Visit (INDEPENDENT_AMBULATORY_CARE_PROVIDER_SITE_OTHER): Payer: Medicare HMO | Admitting: Internal Medicine

## 2017-12-22 VITALS — BP 158/74 | HR 72 | Temp 98.6°F | Ht 62.0 in | Wt 126.0 lb

## 2017-12-22 DIAGNOSIS — R1319 Other dysphagia: Secondary | ICD-10-CM

## 2017-12-22 DIAGNOSIS — R1310 Dysphagia, unspecified: Secondary | ICD-10-CM | POA: Insufficient documentation

## 2017-12-22 NOTE — Patient Instructions (Signed)
The risks of bleeding, perforation and infection were reviewed with patient.  

## 2017-12-22 NOTE — Progress Notes (Signed)
Subjective:    Patient ID: Grace [name], female    DOB: 1945/10/16, 72 y.o.   MRN: [id number]  HPI Referred by Dr. Sallee Lange for dysphagia. She tells me she is having dysphagia. She says she cannot eat without drinking something. She has to clear her throat frequent and has a cough.  She says after she eats, she feels bloated. She cannot tell me if she has acid reflux. She says she cannot eat a steak unless she cuts it up in tiny pieces.  She says she has some constipation and takes Miralax for this.  If she takes MIralax daily she will have diarrhea.  Had EGD/ED in 2006. Esophageal valve which was disrupted. Three small gastric polyps at body which was ablated via cold biopsy. Non-erosive antral gastritis.   Last EGD in April of 2015:  Impression: No evidence of peptic ulcer disease or pyloric stenosis. Few small polyps at gastric body possibly hypoplastic. 4 of these were biopsied. Nonerosive antral gastritis. Review of Systems Past Medical History:  Diagnosis Date  . Cancer (HCC)    Skin Cancer, Squamous Cell  . Diastolic dysfunction 30/86/5784   Ejection fraction 75%.  . Dry eyes, bilateral   . Family history of anesthesia complication    Mother was hard to wake up after anesthesia  . Frequency of urination   . Heart murmur   . Hyperlipidemia    Diet controlled  . Hypothyroid   . Migraine 2013  . Mitral valve prolapse   . Stroke [facility])    3 episodes in past yr  . TIA (transient ischemic attack) 02/19/2012    Past Surgical History:  Procedure Laterality Date  . BALLOON DILATION N/A 07/28/2013   Procedure: BALLOON DILATION;  Surgeon: Rogene Houston, MD;  [facility];  Service: Endoscopy;  Laterality: N/A;  . BREAST BIOPSY     left benign tumor  . CATARACT EXTRACTION W/PHACO Right 07/11/2013   Procedure: CATARACT EXTRACTION PHACO AND INTRAOCULAR LENS PLACEMENT (IOC);  Surgeon: Tonny Branch, MD;  [facility];  Service: Ophthalmology;  Laterality: Right;   CDE 9.99  . CATARACT EXTRACTION W/PHACO Left 08/04/2013   Procedure: CATARACT EXTRACTION PHACO AND INTRAOCULAR LENS PLACEMENT (IOC);  Surgeon: Tonny Branch, MD;  [facility];  Service: Ophthalmology;  Laterality: Left;  CDE:16.26  . CHOLECYSTECTOMY    . COLONOSCOPY  12/18/2010   Procedure: COLONOSCOPY;  Surgeon: Rogene Houston, MD;  [facility];  Service: Endoscopy;  Laterality: N/A;  1:00 pm  . COLONOSCOPY N/A 03/06/2016   Procedure: COLONOSCOPY;  Surgeon: Rogene Houston, MD;  [facility];  Service: Endoscopy;  Laterality: N/A;  1200  . ESOPHAGOGASTRODUODENOSCOPY    . ESOPHAGOGASTRODUODENOSCOPY N/A 07/28/2013   Procedure: ESOPHAGOGASTRODUODENOSCOPY (EGD);  Surgeon: Rogene Houston, MD;  [facility];  Service: Endoscopy;  Laterality: N/A;  125  . LEFT HEART CATH AND CORONARY ANGIOGRAPHY N/A 07/17/2017   Procedure: LEFT HEART CATH AND CORONARY ANGIOGRAPHY;  Surgeon: Sherren Mocha, MD;  Location: [place] [facility];  Service: Cardiovascular;  Laterality: N/A;  . MALONEY DILATION N/A 07/28/2013   Procedure: Venia Minks DILATION;  Surgeon: Rogene Houston, MD;  [facility];  Service: Endoscopy;  Laterality: N/A;  . SAVORY DILATION N/A 07/28/2013   Procedure: SAVORY DILATION;  Surgeon: Rogene Houston, MD;  [facility];  Service: Endoscopy;  Laterality: N/A;  . THYROIDECTOMY  11/20/2011   Procedure: THYROIDECTOMY;  Surgeon: Izora Gala,  MD;  Location: [place];  Service: ENT;  Laterality: Left;  LEFT THYROID LOBECTOMY  . TUBAL LIGATION     38 yrs ago.    Allergies  Allergen Reactions  . Compazine Other (See Comments)    Slurred speech, unsure of other reaction  . Statins Other (See Comments)    myalgias  . Sulfa Drugs Cross Reactors Rash    Current Outpatient Medications on File Prior to Visit  Medication Sig Dispense Refill  . ALPRAZolam (XANAX) 0.25 MG tablet 1/2 or a whole twice a day (Patient taking differently: Take 0.25 mg by mouth  2 (two) times daily as needed for anxiety. ) 30 tablet 0  . ezetimibe (ZETIA) 10 MG tablet TAKE ONE TABLET BY MOUTH ONCE DAILY. 90 tablet 3  . levothyroxine (SYNTHROID, LEVOTHROID) 75 MCG tablet TAKE 1 TABLET TUESDAY TO   SUNDAY AND 1/2 TABLET ON   MONDAY 88 tablet 0  . pantoprazole (PROTONIX) 40 MG tablet One tablet po QHS. 30 tablet 2  . sucralfate (CARAFATE) 1 g tablet Take one po with AC and QHS (Patient not taking: Reported on 12/04/2017) 42 tablet 1   No current facility-administered medications on file prior to visit.         Objective:   Physical Exam Blood pressure (!) 158/74, pulse 72, temperature 98.6 F (37 C), height 5\' 2"  (1.575 m), weight 126 lb (57.2 kg). Alert and oriented. Skin warm and dry. Oral mucosa is moist.   . Sclera anicteric, conjunctivae is pink. Thyroid not enlarged. No cervical lymphadenopathy. Lungs clear. Heart regular rate and rhythm.  Abdomen is soft. Bowel sounds are positive. No hepatomegaly. No abdominal masses felt. No tenderness.  No edema to lower extremities.           Assessment & Plan:  Dysphagia. EGD/ED.  The risks of bleeding, perforation and infection were reviewed with patient.

## 2018-01-05 DIAGNOSIS — L821 Other seborrheic keratosis: Secondary | ICD-10-CM | POA: Diagnosis not present

## 2018-01-05 DIAGNOSIS — L84 Corns and callosities: Secondary | ICD-10-CM | POA: Diagnosis not present

## 2018-01-05 DIAGNOSIS — D485 Neoplasm of uncertain behavior of skin: Secondary | ICD-10-CM | POA: Diagnosis not present

## 2018-01-05 DIAGNOSIS — Z86018 Personal history of other benign neoplasm: Secondary | ICD-10-CM | POA: Diagnosis not present

## 2018-01-05 DIAGNOSIS — D234 Other benign neoplasm of skin of scalp and neck: Secondary | ICD-10-CM | POA: Diagnosis not present

## 2018-01-05 DIAGNOSIS — Z23 Encounter for immunization: Secondary | ICD-10-CM | POA: Diagnosis not present

## 2018-01-05 DIAGNOSIS — D225 Melanocytic nevi of trunk: Secondary | ICD-10-CM | POA: Diagnosis not present

## 2018-01-05 DIAGNOSIS — Z85828 Personal history of other malignant neoplasm of skin: Secondary | ICD-10-CM | POA: Diagnosis not present

## 2018-01-05 DIAGNOSIS — L814 Other melanin hyperpigmentation: Secondary | ICD-10-CM | POA: Diagnosis not present

## 2018-01-12 ENCOUNTER — Other Ambulatory Visit: Payer: Self-pay | Admitting: Family Medicine

## 2018-01-15 ENCOUNTER — Encounter: Payer: Self-pay | Admitting: Family Medicine

## 2018-01-15 ENCOUNTER — Ambulatory Visit (INDEPENDENT_AMBULATORY_CARE_PROVIDER_SITE_OTHER): Payer: Medicare HMO | Admitting: Family Medicine

## 2018-01-15 VITALS — BP 122/72 | Ht 61.75 in | Wt 123.0 lb

## 2018-01-15 DIAGNOSIS — Z23 Encounter for immunization: Secondary | ICD-10-CM | POA: Diagnosis not present

## 2018-01-15 DIAGNOSIS — N3000 Acute cystitis without hematuria: Secondary | ICD-10-CM

## 2018-01-15 DIAGNOSIS — R1310 Dysphagia, unspecified: Secondary | ICD-10-CM

## 2018-01-15 DIAGNOSIS — R109 Unspecified abdominal pain: Secondary | ICD-10-CM | POA: Diagnosis not present

## 2018-01-15 DIAGNOSIS — R350 Frequency of micturition: Secondary | ICD-10-CM | POA: Diagnosis not present

## 2018-01-15 DIAGNOSIS — Z0000 Encounter for general adult medical examination without abnormal findings: Secondary | ICD-10-CM

## 2018-01-15 LAB — POCT URINALYSIS DIPSTICK
Spec Grav, UA: 1.01 (ref 1.010–1.025)
pH, UA: 8 (ref 5.0–8.0)

## 2018-01-15 MED ORDER — NITROFURANTOIN MONOHYD MACRO 100 MG PO CAPS: 100.0000 mg | ORAL_CAPSULE | Freq: Two times a day (BID) | ORAL | 0 refills | Status: AC

## 2018-01-15 NOTE — Progress Notes (Signed)
Subjective:    Patient ID: Grace [name], female    DOB: 1945-11-26, 72 y.o.   MRN: [id number]  HPI  AWV- Annual Wellness Visit  The patient was seen for their annual wellness visit. The patient's past medical history, surgical history, and family history were reviewed. Pertinent vaccines were reviewed ( tetanus, pneumonia, shingles, flu) The patient's medication list was reviewed and updated.  The height and weight were entered.  BMI recorded in electronic record elsewhere  Cognitive screening was completed. Outcome of Mini - Cog: pass   Falls /depression screening electronically recorded within record elsewhere  Current tobacco usage: none (All patients who use tobacco were given written and verbal information on quitting)  Recent listing of emergency department/hospitalizations over the past year were reviewed.  current specialist the patient sees on a regular basis: dermatologist Reports regular vision and dental exams.  Medicare annual wellness visit patient questionnaire was reviewed.  A written screening schedule for the patient for the next 5-10 years was given. Appropriate discussion of followup regarding next visit was discussed.  Additional concerns: would like flu vaccine. Dr Nicki Reaper referred pt to dr Laural Golden. Has appt dec 4th. Pt states she is getting to the point when she can hardly swallow. Stomach issues. Can not eat without getting sick. Has lost weight. Would like to see gi in [place] to get in quicker.   Frequent urination for months, no dysuria or hematuria. Reports frequent constipation.   Flu vaccine today.    DEXA scan: showed osteopenia and osteoporosis, taking reclast, exercising. Not currently on a calcium. Taking 1000 IU of vitamin D3 when she remembers.  Results for orders placed or performed in visit on 01/15/18  POCT urinalysis dipstick  Result Value Ref Range   Color, UA     Clarity, UA     Glucose, UA     Bilirubin, UA     Ketones, UA      Spec Grav, UA 1.010 1.010 - 1.025   Blood, UA     pH, UA 8.0 5.0 - 8.0   Protein, UA     Urobilinogen, UA     Nitrite, UA     Leukocytes, UA Moderate (2+) (A) Negative   Appearance     Odor      Review of Systems  Constitutional: Negative for chills, fatigue and fever.  HENT: Negative for congestion, ear pain, sinus pressure, sinus pain and sore throat.   Eyes: Negative for discharge and visual disturbance.  Respiratory: Negative for cough, shortness of breath and wheezing.   Cardiovascular: Negative for chest pain and leg swelling.  Gastrointestinal: Negative for blood in stool, nausea and vomiting.  Genitourinary: Positive for frequency. Negative for difficulty urinating, dyspareunia, dysuria, flank pain, hematuria, vaginal bleeding and vaginal discharge.  Skin: Negative for color change.  Neurological: Negative for dizziness, weakness, light-headedness and headaches.  Hematological: Negative for adenopathy.  Psychiatric/Behavioral: Negative for suicidal ideas.  All other systems reviewed and are negative.      Objective:   Physical Exam  Constitutional: She is oriented to person, place, and time. She appears well-developed and well-nourished. No distress.  HENT:  Head: Normocephalic and atraumatic.  Right Ear: Tympanic membrane normal.  Left Ear: Tympanic membrane normal.  Nose: Nose normal.  Mouth/Throat: Uvula is midline and oropharynx is clear and moist.  Eyes: Pupils are equal, round, and reactive to light. Conjunctivae and EOM are normal. Right eye exhibits no discharge. Left eye exhibits no discharge.  Neck: Neck supple.  No thyromegaly present.  Cardiovascular: Normal rate, regular rhythm and normal heart sounds.  No murmur heard. Pulmonary/Chest: Effort normal and breath sounds normal. No respiratory distress. She has no wheezes. Right breast exhibits no inverted nipple, no mass, no nipple discharge, no skin change and no tenderness. Left breast exhibits no  inverted nipple, no mass, no nipple discharge, no skin change and no tenderness.  Abdominal: Soft. Bowel sounds are normal. She exhibits no distension and no mass. There is tenderness (tender to upper abdomen (pt being evaluated by GI)).  Genitourinary: Vagina normal and uterus normal. There is no rash, tenderness or lesion on the right labia. There is no rash, tenderness or lesion on the left labia. Cervix exhibits no motion tenderness and no discharge. Right adnexum displays no mass and no tenderness. Left adnexum displays no mass and no tenderness.  Musculoskeletal: She exhibits no edema or deformity.  Lymphadenopathy:    She has no cervical adenopathy.  Neurological: She is alert and oriented to person, place, and time. Coordination normal.  Skin: Skin is warm and dry.  Psychiatric: She has a normal mood and affect.  Nursing note and vitals reviewed.  Urine microscopy with 4-5 WBCs per HPF.     Assessment & Plan:  1. Routine general medical examination at a health care facility Adult wellness-complete.wellness physical was conducted today. Importance of diet and exercise were discussed in detail.  In addition to this a discussion regarding safety was also covered. We also reviewed over immunizations and gave recommendations regarding current immunization needed for age.   -Flu vaccine today  -Tdap prescription given to patient  In addition to this additional areas were also touched on including: Preventative health exams needed:  Colonoscopy UTD due 2022 Mammogram UTD due 2020 Pap Smear: N/A d/t age  Patient was advised yearly wellness exam  Plan: Flu Vaccine QUAD 6+ mos PF IM (Fluarix Quad PF)  2.Acute cystitis without hematuria - Plan: Urine Culture, POCT urinalysis dipstick  WBCs seen on urine microscopy, will go ahead and treat with macrobid x 5 days and send urine for culture. Will notify pt of results. To f/u if symptoms worsen or fail to improve.  3. Abdominal pain,  unspecified abdominal location - Plan: Ambulatory referral to Gastroenterology 4. Dysphagia, unspecified type - Plan: Ambulatory referral to Gastroenterology  Pt requesting referral to GI in [place]. Reports she is still having significant problems with abdominal pain and dysphagia and EGD not scheduled until December 4th with Dr. Laural Golden. States she would like to be seen sooner than this and would like to try to get a faster appt in [place]. Discussed with pt that this may not be possible but will submit referral request for her. Encouraged her to call Dr. Olevia Perches office and let them know that she is willing to come in anytime if they have a cancellation sooner than December. Pt verbalized understanding.  Pt to f/u in December per Dr. Bary Leriche last note for f/u on her hypothyroidism. May f/u sooner if she has additional concerns she would like addressed.   Dr. Nicki Reaper was consulted on this case and is in agreement with the above treatment plan.

## 2018-01-15 NOTE — Patient Instructions (Addendum)
May start calcium supplement 600 mg daily   Ms. Grace [name] , Thank you for taking time to come for your Medicare Wellness Visit. I appreciate your ongoing commitment to your health goals. Please review the following plan we discussed and let me know if I can assist you in the future.   This is a list of the screening recommended for you and due dates:  Health Maintenance  Topic Date Due  . Tetanus Vaccine  01/08/1966      . Flu Shot  10/29/2017  . Mammogram  12/02/2018  . Colon Cancer Screening  03/06/2021  . DEXA scan (bone density measurement)  Completed  .  Hepatitis C: One time screening is recommended by Center for Disease Control  (CDC) for  adults born from [birth year] through 1964.   Completed   Flu shot done today. Prescription for Tdap given. DEXA scan in 2-3 years.

## 2018-01-17 LAB — URINE CULTURE

## 2018-01-17 LAB — SPECIMEN STATUS REPORT

## 2018-01-25 ENCOUNTER — Encounter (INDEPENDENT_AMBULATORY_CARE_PROVIDER_SITE_OTHER): Payer: Self-pay

## 2018-01-27 ENCOUNTER — Other Ambulatory Visit: Payer: Self-pay

## 2018-01-27 ENCOUNTER — Ambulatory Visit (HOSPITAL_COMMUNITY)
Admission: RE | Admit: 2018-01-27 | Discharge: 2018-01-27 | Disposition: A | Discharge status: Home / Self Care | Payer: Medicare HMO | Source: Ambulatory Visit | Attending: Internal Medicine | Admitting: Internal Medicine

## 2018-01-27 ENCOUNTER — Encounter (HOSPITAL_COMMUNITY)
Admission: RE | Disposition: A | Discharge status: Home / Self Care | Payer: Self-pay | Source: Ambulatory Visit | Attending: Internal Medicine

## 2018-01-27 DIAGNOSIS — Z9842 Cataract extraction status, left eye: Secondary | ICD-10-CM | POA: Insufficient documentation

## 2018-01-27 DIAGNOSIS — Z803 Family history of malignant neoplasm of breast: Secondary | ICD-10-CM | POA: Insufficient documentation

## 2018-01-27 DIAGNOSIS — H04123 Dry eye syndrome of bilateral lacrimal glands: Secondary | ICD-10-CM | POA: Insufficient documentation

## 2018-01-27 DIAGNOSIS — Z9049 Acquired absence of other specified parts of digestive tract: Secondary | ICD-10-CM | POA: Insufficient documentation

## 2018-01-27 DIAGNOSIS — R1310 Dysphagia, unspecified: Secondary | ICD-10-CM | POA: Insufficient documentation

## 2018-01-27 DIAGNOSIS — Z85828 Personal history of other malignant neoplasm of skin: Secondary | ICD-10-CM | POA: Diagnosis not present

## 2018-01-27 DIAGNOSIS — R1314 Dysphagia, pharyngoesophageal phase: Secondary | ICD-10-CM | POA: Insufficient documentation

## 2018-01-27 DIAGNOSIS — Z8673 Personal history of transient ischemic attack (TIA), and cerebral infarction without residual deficits: Secondary | ICD-10-CM | POA: Diagnosis not present

## 2018-01-27 DIAGNOSIS — Z823 Family history of stroke: Secondary | ICD-10-CM | POA: Diagnosis not present

## 2018-01-27 DIAGNOSIS — Z8249 Family history of ischemic heart disease and other diseases of the circulatory system: Secondary | ICD-10-CM | POA: Diagnosis not present

## 2018-01-27 DIAGNOSIS — K317 Polyp of stomach and duodenum: Secondary | ICD-10-CM | POA: Insufficient documentation

## 2018-01-27 DIAGNOSIS — R011 Cardiac murmur, unspecified: Secondary | ICD-10-CM | POA: Insufficient documentation

## 2018-01-27 DIAGNOSIS — G43909 Migraine, unspecified, not intractable, without status migrainosus: Secondary | ICD-10-CM | POA: Diagnosis not present

## 2018-01-27 DIAGNOSIS — K219 Gastro-esophageal reflux disease without esophagitis: Secondary | ICD-10-CM | POA: Insufficient documentation

## 2018-01-27 DIAGNOSIS — Z888 Allergy status to other drugs, medicaments and biological substances status: Secondary | ICD-10-CM | POA: Diagnosis not present

## 2018-01-27 DIAGNOSIS — E890 Postprocedural hypothyroidism: Secondary | ICD-10-CM | POA: Insufficient documentation

## 2018-01-27 DIAGNOSIS — Z882 Allergy status to sulfonamides status: Secondary | ICD-10-CM | POA: Insufficient documentation

## 2018-01-27 DIAGNOSIS — Z9841 Cataract extraction status, right eye: Secondary | ICD-10-CM | POA: Insufficient documentation

## 2018-01-27 DIAGNOSIS — R1319 Other dysphagia: Secondary | ICD-10-CM | POA: Insufficient documentation

## 2018-01-27 DIAGNOSIS — R1013 Epigastric pain: Secondary | ICD-10-CM | POA: Insufficient documentation

## 2018-01-27 DIAGNOSIS — Z79899 Other long term (current) drug therapy: Secondary | ICD-10-CM | POA: Diagnosis not present

## 2018-01-27 DIAGNOSIS — K228 Other specified diseases of esophagus: Secondary | ICD-10-CM | POA: Diagnosis not present

## 2018-01-27 DIAGNOSIS — I341 Nonrheumatic mitral (valve) prolapse: Secondary | ICD-10-CM | POA: Diagnosis not present

## 2018-01-27 DIAGNOSIS — E785 Hyperlipidemia, unspecified: Secondary | ICD-10-CM | POA: Insufficient documentation

## 2018-01-27 HISTORY — PX: ESOPHAGEAL DILATION: SHX303

## 2018-01-27 HISTORY — PX: ESOPHAGOGASTRODUODENOSCOPY: SHX5428

## 2018-01-27 SURGERY — EGD (ESOPHAGOGASTRODUODENOSCOPY)
Anesthesia: Moderate Sedation

## 2018-01-27 MED ORDER — MIDAZOLAM HCL 5 MG/5ML IJ SOLN
INTRAMUSCULAR | Status: DC | PRN
  Administered 2018-01-27 (×2): 2 mg via INTRAVENOUS
  Administered 2018-01-27: 1 mg via INTRAVENOUS

## 2018-01-27 MED ORDER — SODIUM CHLORIDE 0.9 % IV SOLN
INTRAVENOUS | Status: DC
  Administered 2018-01-27: 12:00:00 via INTRAVENOUS

## 2018-01-27 MED ORDER — ESOMEPRAZOLE MAGNESIUM 40 MG PO CPDR: 40.0000 mg | DELAYED_RELEASE_CAPSULE | Freq: Every day | ORAL | 5 refills | Status: DC

## 2018-01-27 MED ORDER — LIDOCAINE VISCOUS HCL 2 % MT SOLN
OROMUCOSAL | Status: DC | PRN
  Administered 2018-01-27: 4 mL via OROMUCOSAL

## 2018-01-27 MED ORDER — STERILE WATER FOR IRRIGATION IR SOLN
Status: DC | PRN
  Administered 2018-01-27: 13:00:00

## 2018-01-27 MED ORDER — LIDOCAINE VISCOUS HCL 2 % MT SOLN
OROMUCOSAL | Status: AC
  Filled 2018-01-27: qty 15

## 2018-01-27 MED ORDER — MEPERIDINE HCL 50 MG/ML IJ SOLN
INTRAMUSCULAR | Status: AC
  Filled 2018-01-27: qty 1

## 2018-01-27 MED ORDER — MEPERIDINE HCL 50 MG/ML IJ SOLN
INTRAMUSCULAR | Status: DC | PRN
  Administered 2018-01-27 (×2): 25 mg via INTRAVENOUS

## 2018-01-27 MED ORDER — MIDAZOLAM HCL 5 MG/5ML IJ SOLN
INTRAMUSCULAR | Status: AC
  Filled 2018-01-27: qty 10

## 2018-01-27 NOTE — Discharge Instructions (Signed)
No aspirin or NSAIDs for 24 hours. Discontinue pantoprazole. Begin Esomeprazole 40 mg by mouth 30 minutes before breakfast daily. Resume other medications as before. Resume usual diet. No driving for 24 hours. Please call office with progress report in 1 week.       Esophagogastroduodenoscopy, Care After Refer to this sheet in the next few weeks. These instructions provide you with information about caring for yourself after your procedure. Your health care provider may also give you more specific instructions. Your treatment has been planned according to current medical practices, but problems sometimes occur. Call your health care provider if you have any problems or questions after your procedure. What can I expect after the procedure? After the procedure, it is common to have:  A sore throat.  Nausea.  Bloating.  Dizziness.  Fatigue.  Follow these instructions at home:  Do not eat or drink anything until the numbing medicine (local anesthetic) has worn off and your gag reflex has returned. You will know that the local anesthetic has worn off when you can swallow comfortably.  Do not drive for 24 hours if you received a medicine to help you relax (sedative).  If your health care provider took a tissue sample for testing during the procedure, make sure to get your test results. This is your responsibility. Ask your health care provider or the department performing the test when your results will be ready.  Keep all follow-up visits as told by your health care provider. This is important. Contact a health care provider if:  You cannot stop coughing.  You are not urinating.  You are urinating less than usual. Get help right away if:  You have trouble swallowing.  You cannot eat or drink.  You have throat or chest pain that gets worse.  You are dizzy or light-headed.  You faint.  You have nausea or vomiting.  You have chills.  You have a fever.  You have  severe abdominal pain.  You have black, tarry, or bloody stools. This information is not intended to replace advice given to you by your health care provider. Make sure you discuss any questions you have with your health care provider. Document Released: 03/03/2012 Document Revised: 08/23/2015 Document Reviewed: 02/08/2015 Elsevier Interactive Patient Education  [employer].

## 2018-01-27 NOTE — Op Note (Signed)
[facility] Patient Name: Grace [name] Procedure Date: 01/27/2018 12:55 PM MRN: [id number] Date of Birth: October 22, 1945 Attending MD: Hildred Laser , MD CSN: [id number] Age: 72 Admit Type: Outpatient Procedure:                Upper GI endoscopy Indications:              Epigastric abdominal pain, Esophageal dysphagia Providers:                Hildred Laser, MD, Otis Peak B. Sharon Seller, RN, Nelma Rothman, Technician Referring MD:             Elayne Snare. Wolfgang Phoenix, MD Medicines:                Lidocaine spray, Meperidine 50 mg IV, Midazolam 5                            mg IV Complications:            No immediate complications. Estimated Blood Loss:     Estimated blood loss was minimal. Procedure:                Pre-Anesthesia Assessment:                           - Prior to the procedure, a History and Physical                            was performed, and patient medications and                            allergies were reviewed. The patient's tolerance of                            previous anesthesia was also reviewed. The risks                            and benefits of the procedure and the sedation                            options and risks were discussed with the patient.                            All questions were answered, and informed consent                            was obtained. Prior Anticoagulants: The patient has                            taken no previous anticoagulant or antiplatelet                            agents. ASA Grade Assessment: II - A patient with  mild systemic disease. After reviewing the risks                            and benefits, the patient was deemed in                            satisfactory condition to undergo the procedure.                           After obtaining informed consent, the endoscope was                            passed under direct vision. Throughout the   procedure, the patient's blood pressure, pulse, and                            oxygen saturations were monitored continuously. The                            [id number]-[id number] ([id number]) was introduced through the                            mouth, and advanced to the second part of duodenum.                            The upper GI endoscopy was accomplished without                            difficulty. The patient tolerated the procedure                            well. Scope In: 1:18:06 PM Scope Out: 1:28:02 PM Total Procedure Duration: 0 hours 9 minutes 56 seconds  Findings:      The examined esophagus was normal.      The Z-line was irregular and was found 35 cm from the incisors.      No endoscopic abnormality was evident in the esophagus to explain the       patient's complaint of dysphagia. It was decided, however, to proceed       with dilation of the entire esophagus. The scope was withdrawn. Dilation       was performed with a Maloney dilator with no resistance at 53 Fr. The       dilation site was examined following endoscope reinsertion and showed no       change and no bleeding, mucosal tear or perforation. There was small       area of submucosal bleed at UES.      A few small sessile polyps were found in the gastric body.      The exam of the stomach was otherwise normal.      The duodenal bulb and second portion of the duodenum were normal. Impression:               - Normal esophagus.                           - Z-line irregular, 35 cm from  the incisors.                           - No endoscopic esophageal abnormality to explain                            patient's dysphagia. Esophagus dilated. Dilated.                           - A few gastric polyps. These polyps appeared to be                            hyperplastic and were left alone.                           - Normal duodenal bulb and second portion of the                            duodenum.                           -  No specimens collected. Moderate Sedation:      Moderate (conscious) sedation was administered by the endoscopy nurse       and supervised by the endoscopist. The following parameters were       monitored: oxygen saturation, heart rate, blood pressure, CO2       capnography and response to care. Total physician intraservice time was       15 minutes. Recommendation:           - Patient has a contact number available for                            emergencies. The signs and symptoms of potential                            delayed complications were discussed with the                            patient. Return to normal activities tomorrow.                            Written discharge instructions were provided to the                            patient.                           - Resume previous diet today.                           - Continue present medications but discontinue                            Pantoprazole.                           - Use Nexium (esomeprazole)  40 mg PO daily.                           - Telephone GI clinic in 1 week. Procedure Code(s):        --- Professional ---                           [phone], Esophagogastroduodenoscopy, flexible,                            transoral; diagnostic, including collection of                            specimen(s) by brushing or washing, when performed                            (separate procedure)                           43450, Dilation of esophagus, by unguided sound or                            bougie, single or multiple passes                           G0500, Moderate sedation services provided by the                            same physician or other qualified health care                            professional performing a gastrointestinal                            endoscopic service that sedation supports,                            requiring the presence of an independent trained                            observer  to assist in the monitoring of the                            patient's level of consciousness and physiological                            status; initial 15 minutes of intra-service time;                            patient age 48 years or older (additional time may                            be reported with [phone], as appropriate) Diagnosis Code(s):        --- Professional ---  K22.8, Other specified diseases of esophagus                           K31.7, Polyp of stomach and duodenum                           R10.13, Epigastric pain                           R13.14, Dysphagia, pharyngoesophageal phase CPT copyright 2018 American Medical Association. All rights reserved. The codes documented in this report are preliminary and upon coder review may  be revised to meet current compliance requirements. Hildred Laser, MD Hildred Laser, MD 01/27/2018 1:44:43 PM This report has been signed electronically. Number of Addenda: 0

## 2018-01-27 NOTE — H&P (Addendum)
Grace [name] is an 72 y.o. female.   Chief Complaint: Patient is here for EGD and ED. HPI: Patient is 71 year old Caucasian female who has a chronic GERD and is on pantoprazole who presents with 6 months history of solid food dysphagia.  She has difficulty every time she eats meat.  She points to the suprasternal area sort of bolus obstruction.  Food bolus always passes down when she drinks liquids.  She has not had an episode of food impaction.  She states she has heartburn at least twice a week despite taking medication.  She also complains of postprandial epigastric pain.  She denies nausea vomiting melena or rectal bleeding.  She says she does not have a good appetite.  She has lost about 5 pounds in the last few months. No history of peptic ulcer disease. Esophagus was dilated in 2006.  She was found to have Schatzki's ring. EGD in April 2015 did not reveal esophageal abnormalities.    Past Medical History:  Diagnosis Date  . Cancer (HCC)    Skin Cancer, Squamous Cell  . Diastolic dysfunction 25/85/2778   Ejection fraction 75%.  . Dry eyes, bilateral   . Family history of anesthesia complication    Mother was hard to wake up after anesthesia  . Frequency of urination   . Heart murmur   . Hyperlipidemia    Diet controlled  . Hypothyroid   . Migraine 2013  . Mitral valve prolapse   . Stroke [facility])    3 episodes in past yr  . TIA (transient ischemic attack) 02/19/2012    Past Surgical History:  Procedure Laterality Date  . BALLOON DILATION N/A 07/28/2013   Procedure: BALLOON DILATION;  Surgeon: Rogene Houston, MD;  Location: [facility];  Service: Endoscopy;  Laterality: N/A;  . BREAST BIOPSY     left benign tumor  . CATARACT EXTRACTION W/PHACO Right 07/11/2013   Procedure: CATARACT EXTRACTION PHACO AND INTRAOCULAR LENS PLACEMENT (IOC);  Surgeon: Tonny Branch, MD;  Location: [facility];  Service: Ophthalmology;  Laterality: Right;  CDE 9.99  . CATARACT EXTRACTION W/PHACO Left  08/04/2013   Procedure: CATARACT EXTRACTION PHACO AND INTRAOCULAR LENS PLACEMENT (IOC);  Surgeon: Tonny Branch, MD;  Location: [facility];  Service: Ophthalmology;  Laterality: Left;  CDE:16.26  . CHOLECYSTECTOMY    . COLONOSCOPY  12/18/2010   Procedure: COLONOSCOPY;  Surgeon: Rogene Houston, MD;  Location: [facility];  Service: Endoscopy;  Laterality: N/A;  1:00 pm  . COLONOSCOPY N/A 03/06/2016   Procedure: COLONOSCOPY;  Surgeon: Rogene Houston, MD;  [facility];  Service: Endoscopy;  Laterality: N/A;  1200  . ESOPHAGOGASTRODUODENOSCOPY    . ESOPHAGOGASTRODUODENOSCOPY N/A 07/28/2013   Procedure: ESOPHAGOGASTRODUODENOSCOPY (EGD);  Surgeon: Rogene Houston, MD;  [facility];  Service: Endoscopy;  Laterality: N/A;  125  . LEFT HEART CATH AND CORONARY ANGIOGRAPHY N/A 07/17/2017   Procedure: LEFT HEART CATH AND CORONARY ANGIOGRAPHY;  Surgeon: Sherren Mocha, MD;  Location: [place] [facility];  Service: Cardiovascular;  Laterality: N/A;  . MALONEY DILATION N/A 07/28/2013   Procedure: Venia Minks DILATION;  Surgeon: Rogene Houston, MD;  [facility];  Service: Endoscopy;  Laterality: N/A;  . SAVORY DILATION N/A 07/28/2013   Procedure: SAVORY DILATION;  Surgeon: Rogene Houston, MD;  [facility];  Service: Endoscopy;  Laterality: N/A;  . THYROIDECTOMY  11/20/2011   Procedure: THYROIDECTOMY;  Surgeon: Izora Gala, MD;  Location: [place];  Service:  ENT;  Laterality: Left;  LEFT THYROID LOBECTOMY  . TUBAL LIGATION     39 yrs ago.    Family History  Problem Relation Age of Onset  . Heart disease Mother   . Hypertension Mother   . Stroke Mother   . Stroke Maternal Grandmother   . Cancer Paternal Grandmother        breast  . Colon cancer Neg Hx    Social History:  reports that she has never smoked. She has never used smokeless tobacco. She reports that she does not drink alcohol or use drugs.  Allergies:  Allergies  Allergen Reactions  . Compazine Other (See  Comments)    Slurred speech  . Statins Other (See Comments)    myalgias  . Sulfa Drugs Cross Reactors Rash    Medications Prior to Admission  Medication Sig Dispense Refill  . Carboxymethylcellul-Glycerin (LUBRICATING EYE DROPS OP) Place 1 drop into both eyes as needed (dry eyes).    . cholecalciferol (VITAMIN D) 1000 units tablet Take 1,000 Units by mouth daily at 12 noon.    . ezetimibe (ZETIA) 10 MG tablet TAKE ONE TABLET BY MOUTH ONCE DAILY. (Patient taking differently: Take 10 mg by mouth at bedtime. ) 90 tablet 3  . levothyroxine (SYNTHROID, LEVOTHROID) 75 MCG tablet TAKE 1 TABLET TUESDAY TO   SUNDAY AND 1/2 TABLET ON   MONDAY (Patient taking differently: Take 37.5-75 mcg by mouth See admin instructions. Take 75 mcg daily in the morning except Mondays take 37.5 mg in the morning) 88 tablet 0  . pantoprazole (PROTONIX) 40 MG tablet TAKE 1 TABLET BY MOUTH ONCE A DAY AT BEDTIME (Patient taking differently: Take 40 mg by mouth at bedtime. ) 30 tablet 5  . sucralfate (CARAFATE) 1 g tablet Take one po with AC and QHS (Patient taking differently: Take 1 g by mouth 4 (four) times daily -  before meals and at bedtime. ) 42 tablet 1  . ALPRAZolam (XANAX) 0.25 MG tablet 1/2 or a whole twice a day (Patient taking differently: Take 0.125 mg by mouth daily as needed for anxiety. ) 30 tablet 0    No results found for this or any previous visit (from the past 48 hour(s)). No results found.  ROS  Blood pressure 126/66, pulse 64, temperature 98.5 F (36.9 C), temperature source Oral, resp. rate 12, height 5\' 2"  (1.575 m), weight 56.2 kg, SpO2 93 %. Physical Exam  Constitutional: She appears well-developed and well-nourished.  HENT:  Mouth/Throat: Oropharynx is clear and moist.  Eyes: Conjunctivae are normal. No scleral icterus.  Neck: No thyromegaly present.  Cardiovascular: Normal rate, regular rhythm and normal heart sounds.  No murmur heard. Respiratory: Effort normal and breath sounds  normal.  GI:  Abdomen is symmetrical and soft.  Mild midepigastric tenderness noted.  No organomegaly or masses.  Musculoskeletal: She exhibits no edema.  Lymphadenopathy:    She has no cervical adenopathy.  Neurological: She is alert.  Skin: Skin is warm and dry.     Assessment/Plan Esophageal dysphagia and patient with chronic GERD. Epigastric pain. EGD with ED.  Hildred Laser, MD 01/27/2018, 1:06 PM

## 2018-02-01 ENCOUNTER — Encounter (HOSPITAL_COMMUNITY): Payer: Self-pay | Admitting: Internal Medicine

## 2018-03-01 ENCOUNTER — Telehealth: Payer: Self-pay | Admitting: Family Medicine

## 2018-03-01 MED ORDER — LEVOTHYROXINE SODIUM 75 MCG PO TABS: ORAL_TABLET | ORAL | 1 refills | Status: DC

## 2018-03-01 NOTE — Telephone Encounter (Signed)
Patient is aware we have sent in as requested.

## 2018-03-01 NOTE — Telephone Encounter (Signed)
Med refill: levothyroxine (SYNTHROID, LEVOTHROID) 75 MCG tablet   Pharmacy:  [place], [place]

## 2018-04-20 ENCOUNTER — Encounter: Payer: Self-pay | Admitting: Family Medicine

## 2018-04-20 ENCOUNTER — Ambulatory Visit: Payer: Medicare HMO | Admitting: Family Medicine

## 2018-04-20 VITALS — BP 122/78 | Wt 129.6 lb

## 2018-04-20 DIAGNOSIS — J069 Acute upper respiratory infection, unspecified: Secondary | ICD-10-CM | POA: Diagnosis not present

## 2018-04-20 DIAGNOSIS — E7800 Pure hypercholesterolemia, unspecified: Secondary | ICD-10-CM | POA: Diagnosis not present

## 2018-04-20 DIAGNOSIS — E038 Other specified hypothyroidism: Secondary | ICD-10-CM

## 2018-04-20 DIAGNOSIS — Z23 Encounter for immunization: Secondary | ICD-10-CM

## 2018-04-20 MED ORDER — ZOSTER VAC RECOMB ADJUVANTED 50 MCG/0.5ML IM SUSR: 0.5000 mL | Freq: Once | INTRAMUSCULAR | 1 refills | Status: AC

## 2018-04-20 NOTE — Patient Instructions (Signed)

## 2018-04-20 NOTE — Progress Notes (Signed)
   Subjective:    Patient ID: Grace [name], female    DOB: [birth year], 73 y.o.   MRN: [id number]  HPI Pt here today for 3 month follow up. Pt last seen 01/15/18 for wellness. Pt states she started getting congested yesterday.  Relates head congestion drainage coughing denies high fever chills sweats wheezing difficulty breathing  Pt states she is not sure if it is time to do blood work. Last labs done 12/04/17.  Recent labs were reviewed with patient  Patient doing a good job take her thyroid medicine previous labs reviewed with patient energy level doing good  Immunizations reviewed with patient Review of Systems  Constitutional: Negative for activity change and fever.  HENT: Positive for congestion and rhinorrhea. Negative for ear pain.   Eyes: Negative for discharge.  Respiratory: Positive for cough. Negative for shortness of breath and wheezing.   Cardiovascular: Negative for chest pain.       Objective:   Physical Exam Vitals signs and nursing note reviewed.  Constitutional:      Appearance: She is well-developed.  HENT:     Head: Normocephalic.     Nose: Nose normal.     Mouth/Throat:     Pharynx: No oropharyngeal exudate.  Neck:     Musculoskeletal: Neck supple.  Cardiovascular:     Rate and Rhythm: Normal rate.     Heart sounds: Normal heart sounds. No murmur.  Pulmonary:     Effort: Pulmonary effort is normal.     Breath sounds: Normal breath sounds. No wheezing.  Lymphadenopathy:     Cervical: No cervical adenopathy.  Skin:    General: Skin is warm and dry.           Assessment & Plan:  Immunization updates-pneumococcal 23, shin Grix recommended  Patient was seen today regarding hypothyroidism.  Importance of healthy diet, regular physical activity was discussed.  Importance of compliance with medication and regular checks regarding this was discussed.    Viral URI supportive measures discussed follow-up if problems  History of hyperlipidemia  recheck lipid profile continue healthy diet and regular physical activity  Follow-up within 6 months

## 2018-04-21 ENCOUNTER — Telehealth: Payer: Self-pay | Admitting: Family Medicine

## 2018-04-21 LAB — LIPID PANEL
Chol/HDL Ratio: 3.8 ratio (ref 0.0–4.4)
Cholesterol, Total: 189 mg/dL (ref 100–199)
HDL: 50 mg/dL (ref >39)
LDL Calculated: 120 mg/dL — ABNORMAL HIGH (ref 0–99)
Triglycerides: 96 mg/dL (ref 0–149)
VLDL Cholesterol Cal: 19 mg/dL (ref 5–40)

## 2018-04-21 MED ORDER — AMOXICILLIN 500 MG PO TABS: ORAL_TABLET | ORAL | 0 refills | Status: DC

## 2018-04-21 NOTE — Telephone Encounter (Signed)
Patient is aware we have sent in the medication in to [facility].

## 2018-04-21 NOTE — Telephone Encounter (Signed)
Patient was diagnosed with viral URI yesterday and advised symptomatic care.

## 2018-04-21 NOTE — Telephone Encounter (Signed)
Amoxil 500 mg 1 3 times daily for the next 10 days follow-up if ongoing troubles, #30

## 2018-04-21 NOTE — Telephone Encounter (Signed)
Pt was seen yesterday and told to call back on Monday if not feeling better. Pt is calling in today because she is feeling terrible  She is unsure if its bronchitis or cold. At times her nose is so stopped up she can hardly breath. Last night it was dripping but today she has been stopped up.   Grace [name], Grace [name]

## 2018-04-28 ENCOUNTER — Telehealth: Payer: Self-pay | Admitting: Family Medicine

## 2018-04-28 NOTE — Telephone Encounter (Signed)
I spoke with the pt and she states she does not think she is running a fever and she has a productive cough,blowing nose.She was started on Amoxicillin 500 mg Tid for ten days on 04/21/2018, Not finished. Would like to know should she come back in for recheck, give time for amoxicillin to work or try a different antibiotic.Please advise.

## 2018-04-28 NOTE — Telephone Encounter (Signed)
Patient notified and scheduled follow up office visit for 04/29/18 at 10:30am with Dr Nicki Reaper

## 2018-04-28 NOTE — Telephone Encounter (Signed)
Pt was seen 04/20/18 and prescribed an antibiotic she has been taking it for 7 days and is not feeling any better. She is still coughing, tightness in her chest, still having mucus come up in throat still hoarse. She is wanting to know if she needs to come in and be rechecked or if something else needs to be called in.   If another medication needs to be called in please send to [place], [place]

## 2018-04-28 NOTE — Telephone Encounter (Signed)
To be on the safe side it would be better if we saw her for recheck I am willing to see her tomorrow morning 1030 if she is able to

## 2018-04-29 ENCOUNTER — Encounter: Payer: Self-pay | Admitting: Family Medicine

## 2018-04-29 ENCOUNTER — Ambulatory Visit: Payer: Medicare HMO | Admitting: Family Medicine

## 2018-04-29 VITALS — BP 112/60 | Temp 98.7°F | Ht 61.75 in | Wt 132.0 lb

## 2018-04-29 DIAGNOSIS — J310 Chronic rhinitis: Secondary | ICD-10-CM

## 2018-04-29 DIAGNOSIS — J329 Chronic sinusitis, unspecified: Secondary | ICD-10-CM | POA: Diagnosis not present

## 2018-04-29 MED ORDER — CEFDINIR 300 MG PO CAPS: 300.0000 mg | ORAL_CAPSULE | Freq: Two times a day (BID) | ORAL | 0 refills | Status: DC

## 2018-04-29 MED ORDER — PREDNISONE 20 MG PO TABS: ORAL_TABLET | ORAL | 0 refills | Status: DC

## 2018-04-29 NOTE — Progress Notes (Signed)
   Subjective:    Patient ID: Grace [name], female    DOB: [birth year], 73 y.o.   MRN: [id number]  HPI Patient is here today for recheck from her visit on 04/20/2018.  She states she was seen by [place] on Monday 04/20/2018 cough,congestion, was not given any medication at that time. Antibiotics called in Amoxicillin 500 mg tid on 04/21/2018, she has still been taking this and feels her symptoms she would have been some what better,but she states she is still having the productive cough and chest congestion, and heaviness in chest.  Review of Systems  Constitutional: Negative for activity change and fever.  HENT: Positive for congestion and rhinorrhea. Negative for ear pain.   Eyes: Negative for discharge.  Respiratory: Positive for cough. Negative for shortness of breath and wheezing.   Cardiovascular: Negative for chest pain.       Objective:   Physical Exam Vitals signs and nursing note reviewed.  Constitutional:      Appearance: She is well-developed.  HENT:     Head: Normocephalic.     Nose: Nose normal.     Mouth/Throat:     Pharynx: No oropharyngeal exudate.  Neck:     Musculoskeletal: Neck supple.  Cardiovascular:     Rate and Rhythm: Normal rate.     Heart sounds: Normal heart sounds. No murmur.  Pulmonary:     Effort: Pulmonary effort is normal.     Breath sounds: Normal breath sounds. No wheezing.  Lymphadenopathy:     Cervical: No cervical adenopathy.  Skin:    General: Skin is warm and dry.           Assessment & Plan:  Mary I believe this is a viral illness with secondary infection Rhinosinusitis Acute bronchitis Switch from amoxicillin to Cefzil is reasonable Prednisone for 3 days to help with any airway inflammation I find no evidence of asthma or COPD type flareup Warning signs discussed hold off on x-rays lab work.

## 2018-05-24 ENCOUNTER — Telehealth: Payer: Self-pay | Admitting: Family Medicine

## 2018-05-24 ENCOUNTER — Telehealth: Payer: Self-pay | Admitting: *Deleted

## 2018-05-24 DIAGNOSIS — R490 Dysphonia: Secondary | ICD-10-CM

## 2018-05-24 NOTE — Telephone Encounter (Signed)
Patient states she does not know which way to go. She will call me back to let me know what she wants to do.

## 2018-05-24 NOTE — Telephone Encounter (Signed)
FYI: Patient decided to go with referral to Dr.Teoh. I have placed the referral.

## 2018-05-24 NOTE — Telephone Encounter (Signed)
I certainly understand the request May go ahead with referral to Dr. Dorris Fetch for his opinion on her thyroid But please let the patient know that if she continues with respiratory symptoms and hoarseness of the voice that this is not likely to be thyroid Her previous lab work back in the fall showed good control of her thyroid on her medicine We can certainly repeat her lab work if she is interested  If she has ongoing respiratory symptoms and hoarseness a ENT referral would be potentially helpful  We are not telling her what she needs to do but giving her additional information to consider

## 2018-05-24 NOTE — Telephone Encounter (Signed)
No fevers, would like to be referred to Dr.Nida.Please advise.

## 2018-05-24 NOTE — Telephone Encounter (Signed)
Patient called requesting an appointment to see Dr Dorris Fetch, I made her aware that we will need a referral in order to see her.

## 2018-05-24 NOTE — Telephone Encounter (Signed)
Pt called requesting referral to Dr. Lorie Phenix she's been fighting a cold and has completed 2 rounds of antibiotics and still feels bad, no energy, cold hands/feet, very hoarse & raspy voice that gets worse later in the day  Thinks it may be related to a thyroid issue & wants referral  Please advise & call pt  (pt going out of town 05/30/2018-06/06/2018) (pt hoping to get in this week with Dr. Dorris Fetch)

## 2018-05-26 ENCOUNTER — Telehealth: Payer: Self-pay | Admitting: Family Medicine

## 2018-05-26 NOTE — Telephone Encounter (Signed)
Pt contacted and is aware. Pt verbalized understanding.  

## 2018-05-26 NOTE — Telephone Encounter (Signed)
Spoke with patient yesterday regarding this and the referral.

## 2018-05-26 NOTE — Telephone Encounter (Signed)
I think it is fine for the patient to go on her trip but also think it is wise for her to keep the appointment with Dr.Teoh Certainly if she feels while she is on her trip she has had any type of worsening illness or infection for which she needs medication she can call us and we can send currently right now I do not recommend adding any additional medicine

## 2018-05-26 NOTE — Telephone Encounter (Signed)
Pt called to check status of ENT referral.  Explained it was in the "to do" list.    Was seen a month ago, finished all medicines given Pt states voice comes & goes.  One minute it's fine the next you can barely understand what she's saying.  Voice changes frequently.  States there's a heavy feeling on her chest and it's very frustrating  Pt is supposed to leave on Sunday for the beach for a week.  Wonders if it's ok for her to go due to her hoarseness or should she not go?    Wonders if she should "just ignore it" or if it's something serious.    Call was placed to Dr. Deeann Saint office - pt has been scheduled for 06/17/2018 @ 1:50pm in the [place] office (pt is aware)

## 2018-06-17 ENCOUNTER — Ambulatory Visit (INDEPENDENT_AMBULATORY_CARE_PROVIDER_SITE_OTHER): Payer: Medicare HMO | Admitting: Otolaryngology

## 2018-06-17 DIAGNOSIS — K219 Gastro-esophageal reflux disease without esophagitis: Secondary | ICD-10-CM | POA: Diagnosis not present

## 2018-06-17 DIAGNOSIS — R490 Dysphonia: Secondary | ICD-10-CM | POA: Diagnosis not present

## 2018-08-25 ENCOUNTER — Other Ambulatory Visit: Payer: Self-pay | Admitting: Family Medicine

## 2018-08-25 ENCOUNTER — Telehealth: Payer: Self-pay | Admitting: Family Medicine

## 2018-08-25 MED ORDER — LEVOTHYROXINE SODIUM 75 MCG PO TABS: ORAL_TABLET | ORAL | 0 refills | Status: DC

## 2018-08-25 NOTE — Telephone Encounter (Signed)
Pt is requesting refill on levothyroxine (SYNTHROID, LEVOTHROID) 75 MCG tablet. Please send to [facility] [place] [place]

## 2018-08-25 NOTE — Telephone Encounter (Signed)
Last seen for hypothyroidism 04/19/18

## 2018-08-25 NOTE — Telephone Encounter (Signed)
Medication sent to pharmacy. Left message to return call 

## 2018-08-25 NOTE — Telephone Encounter (Signed)
May have 1 refill Needs virtual visit this summer preferably June  July the latest

## 2018-08-26 NOTE — Telephone Encounter (Signed)
Pt notified per libby

## 2018-09-23 DIAGNOSIS — N390 Urinary tract infection, site not specified: Secondary | ICD-10-CM | POA: Diagnosis not present

## 2018-09-23 DIAGNOSIS — R350 Frequency of micturition: Secondary | ICD-10-CM | POA: Diagnosis not present

## 2018-09-23 DIAGNOSIS — N819 Female genital prolapse, unspecified: Secondary | ICD-10-CM | POA: Diagnosis not present

## 2018-10-11 ENCOUNTER — Ambulatory Visit (INDEPENDENT_AMBULATORY_CARE_PROVIDER_SITE_OTHER): Payer: Medicare HMO | Admitting: Nurse Practitioner

## 2018-10-11 ENCOUNTER — Other Ambulatory Visit: Payer: Self-pay | Admitting: Cardiovascular Disease

## 2018-10-11 ENCOUNTER — Other Ambulatory Visit: Payer: Self-pay

## 2018-10-11 DIAGNOSIS — K5909 Other constipation: Secondary | ICD-10-CM | POA: Diagnosis not present

## 2018-10-11 DIAGNOSIS — K581 Irritable bowel syndrome with constipation: Secondary | ICD-10-CM | POA: Diagnosis not present

## 2018-10-11 MED ORDER — LUBIPROSTONE 8 MCG PO CAPS: 8.0000 ug | ORAL_CAPSULE | Freq: Two times a day (BID) | ORAL | 0 refills | Status: DC

## 2018-10-11 NOTE — Progress Notes (Signed)
Subjective:    Patient ID: Grace [name], female    DOB: 05-12-1945, 73 y.o.   MRN: [id number]  HPIconstipation for 3 weeks. History of IBS. Pt states she tried a fleet enema this morning and has tried hot coffee, miralax. Stool is about the size of a quarter this morning and was runny like diarrhea.   Virtual Visit via Video Note  I connected with Grace [name] on 10/11/18 at  1:40 PM EDT by a video enabled telemedicine application and verified that I am speaking with the correct person using two identifiers.  Location: Patient: home Provider: office   I discussed the limitations of evaluation and management by telemedicine and the availability of in person appointments. The patient expressed understanding and agreed to proceed.  History of Present Illness: Presents for complaints of a flareup of her constipation.  Began about 2 to 3 weeks ago.  Has a history of IBS, constipation predominant but worse lately.  Has been taking MiraLAX daily with warm coffee with no improvement.  Tried a fleets enema this morning with no improvement.  Took Dulcolax 2 tablets recently which resulted in a BM which included a runny bowel movement.  Had a small quarter size BM this morning.  Has a feeling of "fullness".  Decreased appetite.  No abdominal pain.  No blood in her stools.  No fever.  Stools within normal color.  Eats a lot of fiber in her diet.  Drinking plenty of fluids. Denies any changes in medications or changes in stress level over the past few weeks. Colonoscopy up to date.    Observations/Objective: Format  Patient present at home Provider present at office Consent for interaction obtained Coronavirus outbreak made virtual visit necessary  NAD. Alert, oriented. Calm affect. Thoughts logical, coherent and relevant.   Assessment and Plan: Problem List Items Addressed This Visit      Digestive   Irritable bowel syndrome   Relevant Medications   lubiprostone (AMITIZA) 8 MCG capsule   Other Visit Diagnoses    Chronic constipation    -  Primary     Meds ordered this encounter  Medications  . lubiprostone (AMITIZA) 8 MCG capsule    Sig: Take 1 capsule (8 mcg total) by mouth 2 (two) times daily with a meal. For constipation    Dispense:  60 capsule    Refill:  0    Order Specific Question:   Supervising Provider    Answer:   Kathyrn Drown [id number]   Patient requests to start daily medication for constipation.   Follow Up Instructions: Take 3 Dulcolax tonight and see if results by the am.  If no results, start Miralax dose mixed with 8 oz of fluid every hour up to 6 doses. OR take mag citrate as directed If no results from either of these methods, contact office. Start Amitiza as directed. Follow up if no improvement in chronic constipation.  Warning signs reviewed. Call or go to ED if symptoms worsen.    I discussed the assessment and treatment plan with the patient. The patient was provided an opportunity to ask questions and all were answered. The patient agreed with the plan and demonstrated an understanding of the instructions.   The patient was advised to call back or seek an in-person evaluation if the symptoms worsen or if the condition fails to improve as anticipated.  I provided 15 minutes of non-face-to-face time during this encounter.       Review of Systems  Objective:   Physical Exam        Assessment & Plan:

## 2018-10-12 ENCOUNTER — Encounter: Payer: Self-pay | Admitting: Nurse Practitioner

## 2018-10-14 DIAGNOSIS — H33322 Round hole, left eye: Secondary | ICD-10-CM | POA: Diagnosis not present

## 2018-10-14 DIAGNOSIS — N8182 Incompetence or weakening of pubocervical tissue: Secondary | ICD-10-CM | POA: Diagnosis not present

## 2018-10-14 DIAGNOSIS — N3281 Overactive bladder: Secondary | ICD-10-CM | POA: Diagnosis not present

## 2018-10-14 DIAGNOSIS — R350 Frequency of micturition: Secondary | ICD-10-CM | POA: Diagnosis not present

## 2018-11-08 ENCOUNTER — Telehealth: Payer: Self-pay | Admitting: Family Medicine

## 2018-11-08 ENCOUNTER — Other Ambulatory Visit: Payer: Self-pay | Admitting: Cardiovascular Disease

## 2018-11-08 MED ORDER — LEVOTHYROXINE SODIUM 75 MCG PO TABS: ORAL_TABLET | ORAL | 1 refills | Status: DC

## 2018-11-08 NOTE — Telephone Encounter (Signed)
Refills sent in and pt is aware °

## 2018-11-08 NOTE — Telephone Encounter (Signed)
May have this +1 additional refill

## 2018-11-08 NOTE — Telephone Encounter (Signed)
Please advise. Pt has upcoming appt on 11/17/2018; last seen Jan 2020 for thyroid. Thank you

## 2018-11-08 NOTE — Telephone Encounter (Signed)
Pt would like refill on levothyroxine (SYNTHROID) 75 MCG tablet. She has virtual med check scheduled for 8/19.   [facility]

## 2018-11-16 ENCOUNTER — Telehealth: Payer: Self-pay | Admitting: Family Medicine

## 2018-11-16 NOTE — Telephone Encounter (Signed)
Pt states her MyChart info on her tablet states she has an Office visit tomorrow & we have it scheduled as a virtual visit  Please advise if OK to change to in office

## 2018-11-16 NOTE — Telephone Encounter (Signed)
It is fine to have it as a office visit Please obviously do prescreening

## 2018-11-17 ENCOUNTER — Ambulatory Visit: Payer: Medicare HMO | Admitting: Family Medicine

## 2018-11-17 ENCOUNTER — Encounter: Payer: Self-pay | Admitting: Family Medicine

## 2018-11-17 ENCOUNTER — Other Ambulatory Visit: Payer: Self-pay

## 2018-11-17 VITALS — BP 130/82 | Temp 96.0°F | Wt 126.2 lb

## 2018-11-17 DIAGNOSIS — K5909 Other constipation: Secondary | ICD-10-CM | POA: Diagnosis not present

## 2018-11-17 DIAGNOSIS — R634 Abnormal weight loss: Secondary | ICD-10-CM

## 2018-11-17 DIAGNOSIS — E038 Other specified hypothyroidism: Secondary | ICD-10-CM

## 2018-11-17 DIAGNOSIS — R5383 Other fatigue: Secondary | ICD-10-CM

## 2018-11-17 DIAGNOSIS — R1084 Generalized abdominal pain: Secondary | ICD-10-CM

## 2018-11-17 DIAGNOSIS — R6881 Early satiety: Secondary | ICD-10-CM

## 2018-11-17 DIAGNOSIS — D510 Vitamin B12 deficiency anemia due to intrinsic factor deficiency: Secondary | ICD-10-CM | POA: Diagnosis not present

## 2018-11-17 NOTE — Telephone Encounter (Signed)
Done

## 2018-11-17 NOTE — Progress Notes (Signed)
Subjective:    Patient ID: Grace [name], female    DOB: 16-Jul-1945, 73 y.o.   MRN: [id number]  HPI Pt here today for medication refills. Pt states that she has been having constipation, constant fatigue, hair loss, cold hands, cold feet and a full feeling in stomach. Pt has to make herself eat. Pt states she believes this may be due to thyroid issues. Pt states she can only have a BM if she takes meds. Pt has tried Ducolax, Metamucil, Castor oil and prune juice. Pt had virtual visit with Hoyle Sauer regarding constipation on 10/11/2018. Patient also relates intermittent constipation issues she also relates a lot of abdominal discomforts early satiety she had a EGD about a year ago states that time she still has difficulty with food getting hung up as it goes down she denies melena she denies hematochezia denies hematemesis she has lost some weight over the past year she was 134 pounds then she went down to 129 then she went down to 124 this is been over about a year and a couple months she states her energy level is low she denies being depressed she does state that she runs out of energy quickly she worries that there is something more serious going on.  She is tried various OTC measures she is tried getting extra rest and she states none of that is helped she has a past medical history of irritable bowel she also has a past medical history of hypothyroidism please see her medication list past medical history problem list and history thank you Review of Systems  Constitutional: Positive for fatigue. Negative for activity change and appetite change.  HENT: Negative for congestion and rhinorrhea.   Respiratory: Negative for cough and shortness of breath.   Cardiovascular: Negative for chest pain and leg swelling.  Gastrointestinal: Positive for abdominal pain, constipation and nausea. Negative for diarrhea.  Endocrine: Negative for polydipsia and polyphagia.  Skin: Negative for color change.   Neurological: Negative for dizziness, weakness and numbness.  Psychiatric/Behavioral: Negative for behavioral problems and confusion.       Objective:   Physical Exam Vitals signs reviewed.  Constitutional:      General: She is not in acute distress. HENT:     Head: Normocephalic and atraumatic.  Eyes:     General:        Right eye: No discharge.        Left eye: No discharge.  Neck:     Trachea: No tracheal deviation.  Cardiovascular:     Rate and Rhythm: Normal rate and regular rhythm.     Heart sounds: Normal heart sounds. No murmur.  Pulmonary:     Effort: Pulmonary effort is normal. No respiratory distress.     Breath sounds: Normal breath sounds.  Abdominal:     General: Abdomen is flat.     Palpations: Abdomen is soft. There is no mass.     Tenderness: There is abdominal tenderness.     Hernia: No hernia is present.  Lymphadenopathy:     Cervical: No cervical adenopathy.  Skin:    General: Skin is warm and dry.  Neurological:     Mental Status: She is alert.     Coordination: Coordination normal.  Psychiatric:        Behavior: Behavior normal.           Assessment & Plan:  1. Other specified hypothyroidism She does take her medicine but she is having significant fatigue with weight loss  and also cold intolerance hair falling out constipation so therefore check TSH free T4 and T3 continue medicine as this - CBC with Differential - Hepatic function panel - Basic Metabolic Panel (BMET) - Lipase - Amylase - TSH - T4, free - T3 - Vitamin D (25 hydroxy) - B12  2. Early satiety Significant early satiety with weight loss patient denies being depressed I did encourage her to do a 3-day food diary and send it back to [place] for our review so we can give her some suggestions what to do she may well need to have another EGD in the near future I do not feel she needs a colonoscopy at this time - CBC with Differential - Hepatic function panel - Basic Metabolic  Panel (BMET) - Lipase - Amylase - TSH - T4, free - T3 - Vitamin D (25 hydroxy) - B12  3. Other fatigue Significant fatigue and tiredness could be related to thyroid anemia could also be related to weight loss could also be related to poor p.o. intake she denies being depressed we will do lab work first and see where that goes - CBC with Differential - Hepatic function panel - Basic Metabolic Panel (BMET) - Lipase - Amylase - TSH - T4, free - T3 - Vitamin D (25 hydroxy) - B12  4. Pernicious anemia History of pernicious anemia we will check B12 level to make sure that that is not often causing some of her problems - CBC with Differential - Hepatic function panel - Basic Metabolic Panel (BMET) - Lipase - Amylase - TSH - T4, free - T3 - Vitamin D (25 hydroxy) - B12  5. Chronic constipation She is having chronic constipation she is tried Amitiza it did not really seem to do much but nonetheless we could try other measures possibly Linzess or a higher dose of Amitiza We may need to consult with her gastroenterologist as well they may need to do a virtual visit with her she does have significant abdominal tenderness see below - CBC with Differential - Hepatic function panel - Basic Metabolic Panel (BMET) - Lipase - Amylase - TSH - T4, free - T3 - Vitamin D (25 hydroxy) - B12  6. Generalized abdominal pain So she has been having significant abdominal tenderness off and on for the past several weeks with early satiety achiness in the abdomen that is been going on for at least 4 to [redacted] weeks along with weight loss given all of this I believe the patient will need to have a CT scan of the abdomen with contrast but for right now we will do lab work first - CBC with Differential - Hepatic function panel - Basic Metabolic Panel (BMET) - Lipase - Amylase - TSH - T4, free - T3 - Vitamin D (25 hydroxy) - B12   25 minutes was spent with the patient.  This statement verifies  that 25 minutes was indeed spent with the patient.  More than 50% of this visit-total duration of the visit-was spent in counseling and coordination of care. The issues that the patient came in for today as reflected in the diagnosis (s) please refer to documentation for further details.

## 2018-11-18 LAB — CBC WITH DIFFERENTIAL/PLATELET
Basophils Absolute: 0.1 10*3/uL (ref 0.0–0.2)
Basos: 1 %
EOS (ABSOLUTE): 0.1 10*3/uL (ref 0.0–0.4)
Eos: 1 %
Hematocrit: 43 % (ref 34.0–46.6)
Hemoglobin: 14.5 g/dL (ref 11.1–15.9)
Immature Grans (Abs): 0 10*3/uL (ref 0.0–0.1)
Immature Granulocytes: 0 %
Lymphocytes Absolute: 1.7 10*3/uL (ref 0.7–3.1)
Lymphs: 26 %
MCH: 30.6 pg (ref 26.6–33.0)
MCHC: 33.7 g/dL (ref 31.5–35.7)
MCV: 91 fL (ref 79–97)
Monocytes Absolute: 0.7 10*3/uL (ref 0.1–0.9)
Monocytes: 11 %
Neutrophils Absolute: 3.9 10*3/uL (ref 1.4–7.0)
Neutrophils: 61 %
Platelets: 207 10*3/uL (ref 150–450)
RBC: 4.74 x10E6/uL (ref 3.77–5.28)
RDW: 12.4 % (ref 11.7–15.4)
WBC: 6.4 10*3/uL (ref 3.4–10.8)

## 2018-11-18 LAB — HEPATIC FUNCTION PANEL
ALT: 18 IU/L (ref 0–32)
AST: 26 IU/L (ref 0–40)
Albumin: 4.5 g/dL (ref 3.7–4.7)
Alkaline Phosphatase: 61 IU/L (ref 39–117)
Bilirubin Total: 0.5 mg/dL (ref 0.0–1.2)
Bilirubin, Direct: 0.13 mg/dL (ref 0.00–0.40)
Total Protein: 7.3 g/dL (ref 6.0–8.5)

## 2018-11-18 LAB — BASIC METABOLIC PANEL
BUN/Creatinine Ratio: 13 (ref 12–28)
BUN: 10 mg/dL (ref 8–27)
CO2: 27 mmol/L (ref 20–29)
Calcium: 9.4 mg/dL (ref 8.7–10.3)
Chloride: 99 mmol/L (ref 96–106)
Creatinine, Ser: 0.78 mg/dL (ref 0.57–1.00)
GFR calc Af Amer: 88 mL/min/{1.73_m2} (ref >59)
GFR calc non Af Amer: 77 mL/min/{1.73_m2} (ref >59)
Glucose: 71 mg/dL (ref 65–99)
Potassium: 4.1 mmol/L (ref 3.5–5.2)
Sodium: 139 mmol/L (ref 134–144)

## 2018-11-18 LAB — T4, FREE: Free T4: 1.3 ng/dL (ref 0.82–1.77)

## 2018-11-18 LAB — T3: T3, Total: 91 ng/dL (ref 71–180)

## 2018-11-18 LAB — TSH: TSH: 1.89 u[IU]/mL (ref 0.450–4.500)

## 2018-11-18 LAB — AMYLASE: Amylase: 45 U/L (ref 31–110)

## 2018-11-18 LAB — VITAMIN D 25 HYDROXY (VIT D DEFICIENCY, FRACTURES): Vit D, 25-Hydroxy: 53.6 ng/mL (ref 30.0–100.0)

## 2018-11-18 LAB — LIPASE: Lipase: 26 U/L (ref 14–85)

## 2018-11-18 LAB — VITAMIN B12: Vitamin B-12: 275 pg/mL (ref 232–1245)

## 2018-11-22 NOTE — Addendum Note (Signed)
Addended by: Dairl Ponder on: 11/22/2018 03:14 PM   Modules accepted: Orders

## 2018-11-29 ENCOUNTER — Other Ambulatory Visit: Payer: Self-pay | Admitting: Cardiovascular Disease

## 2018-12-08 ENCOUNTER — Encounter (HOSPITAL_COMMUNITY): Payer: Medicare HMO

## 2018-12-08 ENCOUNTER — Encounter (HOSPITAL_COMMUNITY)
Admission: RE | Admit: 2018-12-08 | Discharge: 2018-12-08 | Disposition: A | Discharge status: Home / Self Care | Payer: Medicare HMO | Source: Ambulatory Visit | Attending: Family Medicine | Admitting: Family Medicine

## 2018-12-08 ENCOUNTER — Other Ambulatory Visit (HOSPITAL_COMMUNITY): Payer: Self-pay | Admitting: Family Medicine

## 2018-12-08 ENCOUNTER — Encounter (HOSPITAL_COMMUNITY): Payer: Self-pay

## 2018-12-08 ENCOUNTER — Other Ambulatory Visit: Payer: Self-pay

## 2018-12-08 DIAGNOSIS — Z1231 Encounter for screening mammogram for malignant neoplasm of breast: Secondary | ICD-10-CM

## 2018-12-08 DIAGNOSIS — M810 Age-related osteoporosis without current pathological fracture: Secondary | ICD-10-CM | POA: Insufficient documentation

## 2018-12-08 MED ORDER — ZOLEDRONIC ACID 5 MG/100ML IV SOLN
5.0000 mg | Freq: Once | INTRAVENOUS | Status: AC
  Administered 2018-12-08: 09:00:00 5 mg via INTRAVENOUS

## 2018-12-08 MED ORDER — SODIUM CHLORIDE 0.9 % IV SOLN
Freq: Once | INTRAVENOUS | Status: AC
  Administered 2018-12-08: 09:00:00 via INTRAVENOUS

## 2018-12-13 ENCOUNTER — Other Ambulatory Visit (HOSPITAL_COMMUNITY): Payer: Self-pay | Admitting: Family Medicine

## 2018-12-13 ENCOUNTER — Encounter (HOSPITAL_COMMUNITY): Payer: Self-pay

## 2018-12-13 ENCOUNTER — Ambulatory Visit (HOSPITAL_COMMUNITY): Payer: Medicare HMO

## 2018-12-13 ENCOUNTER — Ambulatory Visit (HOSPITAL_COMMUNITY)
Admission: RE | Admit: 2018-12-13 | Discharge: 2018-12-13 | Disposition: A | Discharge status: Home / Self Care | Payer: Medicare HMO | Source: Ambulatory Visit | Attending: Family Medicine | Admitting: Family Medicine

## 2018-12-13 ENCOUNTER — Other Ambulatory Visit: Payer: Self-pay

## 2018-12-13 DIAGNOSIS — R6881 Early satiety: Secondary | ICD-10-CM | POA: Insufficient documentation

## 2018-12-13 DIAGNOSIS — R109 Unspecified abdominal pain: Secondary | ICD-10-CM | POA: Diagnosis not present

## 2018-12-13 DIAGNOSIS — Z1231 Encounter for screening mammogram for malignant neoplasm of breast: Secondary | ICD-10-CM

## 2018-12-13 DIAGNOSIS — R928 Other abnormal and inconclusive findings on diagnostic imaging of breast: Secondary | ICD-10-CM

## 2018-12-13 DIAGNOSIS — R634 Abnormal weight loss: Secondary | ICD-10-CM | POA: Diagnosis present

## 2018-12-13 DIAGNOSIS — R1110 Vomiting, unspecified: Secondary | ICD-10-CM | POA: Diagnosis not present

## 2018-12-13 DIAGNOSIS — R1084 Generalized abdominal pain: Secondary | ICD-10-CM | POA: Diagnosis present

## 2018-12-13 MED ORDER — IOHEXOL 300 MG/ML  SOLN
100.0000 mL | Freq: Once | INTRAMUSCULAR | Status: AC | PRN
  Administered 2018-12-13: 100 mL via INTRAVENOUS

## 2018-12-14 NOTE — Addendum Note (Signed)
Addended by: Dairl Ponder on: 12/14/2018 10:26 AM   Modules accepted: Orders

## 2018-12-16 ENCOUNTER — Other Ambulatory Visit: Payer: Self-pay | Admitting: Cardiovascular Disease

## 2018-12-20 ENCOUNTER — Telehealth: Payer: Self-pay | Admitting: Cardiovascular Disease

## 2018-12-20 MED ORDER — EZETIMIBE 10 MG PO TABS: 10.0000 mg | ORAL_TABLET | Freq: Every day | ORAL | 3 refills | Status: DC

## 2018-12-20 NOTE — Addendum Note (Signed)
Addended by: Barbarann Ehlers A on: 12/20/2018 11:45 AM   Modules accepted: Orders

## 2018-12-20 NOTE — Telephone Encounter (Signed)
Virtual Visit Pre-Appointment Phone Call  "(Name), I am calling you today to discuss your upcoming appointment. We are currently trying to limit exposure to the virus that causes COVID-19 by seeing patients at home rather than in the office."  1. "What is the BEST phone number to call the day of the visit?" - include this in appointment notes  2. Do you have or have access to (through a family member/friend) a smartphone with video capability that we can use for your visit?" a. If yes - list this number in appt notes as cell (if different from BEST phone #) and list the appointment type as a VIDEO visit in appointment notes b. If no - list the appointment type as a PHONE visit in appointment notes  3. Confirm consent - "In the setting of the current Covid19 crisis, you are scheduled for a (phone or video) visit with your provider on (date) at (time).  Just as we do with many in-office visits, in order for you to participate in this visit, we must obtain consent.  If you'd like, I can send this to your mychart (if signed up) or email for you to review.  Otherwise, I can obtain your verbal consent now.  All virtual visits are billed to your insurance company just like a normal visit would be.  By agreeing to a virtual visit, we'd like you to understand that the technology does not allow for your provider to perform an examination, and thus may limit your provider's ability to fully assess your condition. If your provider identifies any concerns that need to be evaluated in person, we will make arrangements to do so.  Finally, though the technology is pretty good, we cannot assure that it will always work on either your or our end, and in the setting of a video visit, we may have to convert it to a phone-only visit.  In either situation, we cannot ensure that we have a secure connection.  Are you willing to proceed?" STAFF: Did the patient verbally acknowledge consent to telehealth visit? Document  YES/NO here: Yes  4. Advise patient to be prepared - "Two hours prior to your appointment, go ahead and check your blood pressure, pulse, oxygen saturation, and your weight (if you have the equipment to check those) and write them all down. When your visit starts, your provider will ask you for this information. If you have an Apple Watch or Kardia device, please plan to have heart rate information ready on the day of your appointment. Please have a pen and paper handy nearby the day of the visit as well."  5. Give patient instructions for MyChart download to smartphone OR [email or web address] as below if video visit (depending on what platform provider is using)  6. Inform patient they will receive a phone call 15 minutes prior to their appointment time (may be from unknown caller ID) so they should be prepared to answer    TELEPHONE CALL NOTE  ISIDRA SARPONG has been deemed a candidate for a follow-up tele-health visit to limit community exposure during the Covid-19 pandemic. I spoke with the patient via phone to ensure availability of phone/video source, confirm preferred email & phone number, and discuss instructions and expectations.  I reminded JAYEL SIMEK to be prepared with any vital sign and/or heart rhythm information that could potentially be obtained via home monitoring, at the time of her visit. I reminded BREAHNA LAMOREAUX to expect a phone call prior to  her visit.  Terry L Goins 12/20/2018 11:18 AM

## 2018-12-20 NOTE — Telephone Encounter (Signed)
Patient left message requesting refill on Zetia be called to pharmacy. States that she had 7 called in and needs more because she is going out of town on Saturday. / tg

## 2018-12-21 ENCOUNTER — Telehealth: Payer: Self-pay | Admitting: Licensed Clinical Social Worker

## 2018-12-21 ENCOUNTER — Other Ambulatory Visit: Payer: Self-pay

## 2018-12-21 ENCOUNTER — Encounter: Payer: Self-pay | Admitting: Cardiovascular Disease

## 2018-12-21 ENCOUNTER — Ambulatory Visit (HOSPITAL_COMMUNITY): Admission: RE | Admit: 2018-12-21 | Payer: Medicare HMO | Source: Ambulatory Visit

## 2018-12-21 ENCOUNTER — Telehealth (INDEPENDENT_AMBULATORY_CARE_PROVIDER_SITE_OTHER): Payer: Medicare HMO | Admitting: Cardiovascular Disease

## 2018-12-21 ENCOUNTER — Ambulatory Visit (HOSPITAL_COMMUNITY)
Admission: RE | Admit: 2018-12-21 | Discharge: 2018-12-21 | Disposition: A | Discharge status: Home / Self Care | Payer: Medicare HMO | Source: Ambulatory Visit | Attending: Family Medicine | Admitting: Family Medicine

## 2018-12-21 ENCOUNTER — Encounter (HOSPITAL_COMMUNITY): Payer: Self-pay

## 2018-12-21 VITALS — Ht 61.0 in | Wt 127.0 lb

## 2018-12-21 DIAGNOSIS — E782 Mixed hyperlipidemia: Secondary | ICD-10-CM

## 2018-12-21 DIAGNOSIS — Z8673 Personal history of transient ischemic attack (TIA), and cerebral infarction without residual deficits: Secondary | ICD-10-CM

## 2018-12-21 DIAGNOSIS — M791 Myalgia, unspecified site: Secondary | ICD-10-CM

## 2018-12-21 DIAGNOSIS — R928 Other abnormal and inconclusive findings on diagnostic imaging of breast: Secondary | ICD-10-CM | POA: Insufficient documentation

## 2018-12-21 DIAGNOSIS — G720 Drug-induced myopathy: Secondary | ICD-10-CM

## 2018-12-21 DIAGNOSIS — R921 Mammographic calcification found on diagnostic imaging of breast: Secondary | ICD-10-CM | POA: Diagnosis not present

## 2018-12-21 MED ORDER — EZETIMIBE 10 MG PO TABS: 10.0000 mg | ORAL_TABLET | Freq: Every day | ORAL | 0 refills | Status: DC

## 2018-12-21 NOTE — Telephone Encounter (Signed)
CSW referred to assist patient with obtaining a BP cuff. CSW contacted patient to inform cuff will be delivered to home. Patient grateful for support and assistance. CSW available as needed. Jackie Jannet Calip, LCSW, CCSW-MCS [phone]  

## 2018-12-21 NOTE — Addendum Note (Signed)
Addended by: Barbarann Ehlers A on: 12/21/2018 11:35 AM   Modules accepted: Orders

## 2018-12-21 NOTE — Progress Notes (Signed)
Virtual Visit via Telephone Note   This visit type was conducted due to national recommendations for restrictions regarding the COVID-19 Pandemic (e.g. social distancing) in an effort to limit this patient's exposure and mitigate transmission in our community.  Due to her co-morbid illnesses, this patient is at least at moderate risk for complications without adequate follow up.  This format is felt to be most appropriate for this patient at this time.  The patient did not have access to video technology/had technical difficulties with video requiring transitioning to audio format only (telephone).  All issues noted in this document were discussed and addressed.  No physical exam could be performed with this format.  Please refer to the patient's chart for her  consent to telehealth for [facility].   Date:  12/21/2018   ID:  Grace [name], DOB [birth year], MRN [id number]  Patient Location: Home Provider Location: Home  PCP:  Grace Drown, MD  Cardiologist:  Kate Sable, MD  Electrophysiologist:  None   Evaluation Performed:  Follow-Up Visit  Chief Complaint:  Palpitations  History of Present Illness:    Grace [name] is a 73 y.o. female with a history of palpitations.  She underwent a normal coronary angiogram on 07/17/2017.  She previously wore a cardiac monitor which demonstrated sinus rhythm with no arrhythmias. Her symptoms correlated with sinus rhythm.  She has hypercholesterolemia and asked about getting her Zetia refills.  The patient denies any symptoms of chest pain, palpitations, shortness of breath, lightheadedness, dizziness, leg swelling, orthopnea, PND, and syncope.  The patient does not have symptoms concerning for COVID-19 infection (fever, chills, cough, or new shortness of breath).    Past Medical History:  Diagnosis Date  . Cancer (HCC)    Skin Cancer, Squamous Cell  . Diastolic dysfunction 123456   Ejection fraction 75%.  . Dry eyes,  bilateral   . Family history of anesthesia complication    Mother was hard to wake up after anesthesia  . Frequency of urination   . Heart murmur   . Hyperlipidemia    Diet controlled  . Hypothyroid   . Migraine 2013  . Mitral valve prolapse   . Stroke [facility])    3 episodes in past yr  . TIA (transient ischemic attack) 02/19/2012   Past Surgical History:  Procedure Laterality Date  . BALLOON DILATION N/A 07/28/2013   Procedure: BALLOON DILATION;  Surgeon: Rogene Houston, MD;  [facility];  Service: Endoscopy;  Laterality: N/A;  . BREAST BIOPSY     left benign tumor  . CATARACT EXTRACTION W/PHACO Right 07/11/2013   Procedure: CATARACT EXTRACTION PHACO AND INTRAOCULAR LENS PLACEMENT (IOC);  Surgeon: Tonny Branch, MD;  Location: [facility];  Service: Ophthalmology;  Laterality: Right;  CDE 9.99  . CATARACT EXTRACTION W/PHACO Left 08/04/2013   Procedure: CATARACT EXTRACTION PHACO AND INTRAOCULAR LENS PLACEMENT (IOC);  Surgeon: Tonny Branch, MD;  [facility]: [facility];  Service: Ophthalmology;  Laterality: Left;  CDE:16.26  . CHOLECYSTECTOMY    . COLONOSCOPY  12/18/2010   Procedure: COLONOSCOPY;  Surgeon: Rogene Houston, MD;  [facility];  Service: Endoscopy;  Laterality: N/A;  1:00 pm  . COLONOSCOPY N/A 03/06/2016   Procedure: COLONOSCOPY;  Surgeon: Rogene Houston, MD;  [facility];  Service: Endoscopy;  Laterality: N/A;  1200  . ESOPHAGEAL DILATION N/A 01/27/2018   Procedure: ESOPHAGEAL DILATION;  Surgeon: Rogene Houston, MD;  [facility];  Service: Endoscopy;  Laterality: N/A;  . ESOPHAGOGASTRODUODENOSCOPY    . ESOPHAGOGASTRODUODENOSCOPY N/A 07/28/2013   Procedure: ESOPHAGOGASTRODUODENOSCOPY (EGD);  Surgeon: Rogene Houston, MD;  [facility];  Service: Endoscopy;  Laterality: N/A;  125  . ESOPHAGOGASTRODUODENOSCOPY N/A 01/27/2018   Procedure: ESOPHAGOGASTRODUODENOSCOPY (EGD);  Surgeon: Rogene Houston, MD;  [facility];   Service: Endoscopy;  Laterality: N/A;  2:00-moved to 10/30 @ 2:45pm per Lelon Frohlich  . LEFT HEART CATH AND CORONARY ANGIOGRAPHY N/A 07/17/2017   Procedure: LEFT HEART CATH AND CORONARY ANGIOGRAPHY;  Surgeon: Sherren Mocha, MD;  Location: [place] CV LAB;  Service: Cardiovascular;  Laterality: N/A;  . MALONEY DILATION N/A 07/28/2013   Procedure: Venia Minks DILATION;  Surgeon: Rogene Houston, MD;  [facility];  Service: Endoscopy;  Laterality: N/A;  . SAVORY DILATION N/A 07/28/2013   Procedure: SAVORY DILATION;  Surgeon: Rogene Houston, MD;  [facility];  Service: Endoscopy;  Laterality: N/A;  . THYROIDECTOMY  11/20/2011   Procedure: THYROIDECTOMY;  Surgeon: Izora Gala, MD;  Location: [place];  Service: ENT;  Laterality: Left;  LEFT THYROID LOBECTOMY  . TUBAL LIGATION     38 yrs ago.     Current Meds  Medication Sig  . Carboxymethylcellul-Glycerin (LUBRICATING EYE DROPS OP) Place 1 drop into both eyes as needed (dry eyes).  . cholecalciferol (VITAMIN D) 1000 units tablet Take 1,000 Units by mouth daily at 12 noon.  . ezetimibe (ZETIA) 10 MG tablet Take 1 tablet (10 mg total) by mouth daily.  Marland Kitchen levothyroxine (SYNTHROID) 75 MCG tablet TAKE 1 TABLET TUESDAY TO   SUNDAY AND 1/2 TABLET ON   MONDAY  . Multiple Vitamin (MULTI-VITAMIN DAILY PO) Take by mouth. Alive multi vit  . vitamin B-12 (CYANOCOBALAMIN) 1000 MCG tablet Take 1,000 mcg by mouth daily.  . vitamin C (ASCORBIC ACID) 500 MG tablet Take 500 mg by mouth daily.     Allergies:   Compazine, Statins, and Sulfa drugs cross reactors   Social History   Tobacco Use  . Smoking status: Never Smoker  . Smokeless tobacco: Never Used  Substance Use Topics  . Alcohol use: No    Alcohol/week: 0.0 standard drinks  . Drug use: No     Family Hx: The patient's family history includes Cancer in her paternal grandmother; Heart disease in her mother; Hypertension in her mother; Stroke in her maternal grandmother and mother. There is  no history of Colon cancer.  ROS:   Please see the history of present illness.     All other systems reviewed and are negative.   Prior CV studies:   The following studies were reviewed today:  Reviewed above  Labs/Other Tests and Data Reviewed:    EKG:  No ECG reviewed.  Recent Labs: 11/17/2018: ALT 18; BUN 10; Creatinine, Ser 0.78; Hemoglobin 14.5; Platelets 207; Potassium 4.1; Sodium 139; TSH 1.890   Recent Lipid Panel Lab Results  Component Value Date/Time   CHOL 189 04/20/2018 09:51 AM   TRIG 96 04/20/2018 09:51 AM   HDL 50 04/20/2018 09:51 AM   CHOLHDL 3.8 04/20/2018 09:51 AM   CHOLHDL 4.1 05/25/2017 01:50 PM   LDLCALC 120 (H) 04/20/2018 09:51 AM    Wt Readings from Last 3 Encounters:  12/21/18 127 lb (57.6 kg)  12/08/18 125 lb (56.7 kg)  11/17/18 126 lb 3.2 oz (57.2 kg)     Objective:    Vital Signs:  Ht 5\' 1"  (1.549 m)   Wt 127 lb (57.6 kg)  BMI 24.00 kg/m    VITAL SIGNS:  reviewed  ASSESSMENT & PLAN:    1.  Hypercholesterolemia: Lipids reviewed above.  She asked about getting her Zetia refilled.  I will provide a 90-day supply but this can be refilled by her PCP going forward.  2.  Reported history of TIA: She had been on ASA and is on Zetia and is statin intolerant.  No evidence of atrial fibrillation or any other arrhythmias by cardiac monitoring in the past.  COVID-19 Education: The signs and symptoms of COVID-19 were discussed with the patient and how to seek care for testing (follow up with PCP or arrange E-visit).  The importance of social distancing was discussed today.  Time:   Today, I have spent 5 minutes with the patient with telehealth technology discussing the above problems.     Medication Adjustments/Labs and Tests Ordered: Current medicines are reviewed at length with the patient today.  Concerns regarding medicines are outlined above.   Tests Ordered: No orders of the defined types were placed in this encounter.   Medication  Changes: No orders of the defined types were placed in this encounter.   Follow Up:  Virtual Visit or In Person prn  Signed, Kate Sable, MD  12/21/2018 11:22 AM    [place]

## 2018-12-21 NOTE — Patient Instructions (Signed)
Medication Instructions: Your physician recommends that you continue on your current medications as directed. Please refer to the Current Medication list given to you today.   Labwork: None  Procedures/Testing: None  Follow-Up:  As needed with Dr.Koneswaran  Any Additional Special Instructions Will Be Listed Below (If Applicable).     Thank you for choosing [place] !

## 2018-12-22 DIAGNOSIS — L821 Other seborrheic keratosis: Secondary | ICD-10-CM | POA: Diagnosis not present

## 2018-12-22 DIAGNOSIS — D225 Melanocytic nevi of trunk: Secondary | ICD-10-CM | POA: Diagnosis not present

## 2018-12-22 DIAGNOSIS — L814 Other melanin hyperpigmentation: Secondary | ICD-10-CM | POA: Diagnosis not present

## 2018-12-22 DIAGNOSIS — D485 Neoplasm of uncertain behavior of skin: Secondary | ICD-10-CM | POA: Diagnosis not present

## 2018-12-22 DIAGNOSIS — Z86018 Personal history of other benign neoplasm: Secondary | ICD-10-CM | POA: Diagnosis not present

## 2018-12-22 DIAGNOSIS — Z85828 Personal history of other malignant neoplasm of skin: Secondary | ICD-10-CM | POA: Diagnosis not present

## 2018-12-22 DIAGNOSIS — L905 Scar conditions and fibrosis of skin: Secondary | ICD-10-CM | POA: Diagnosis not present

## 2018-12-22 DIAGNOSIS — Z23 Encounter for immunization: Secondary | ICD-10-CM | POA: Diagnosis not present

## 2019-01-19 NOTE — Progress Notes (Signed)
Subjective:    Patient ID: Grace [name], female    DOB: 1946-02-14, 73 y.o.   MRN: [id number]  HPI Grace [name] is a 73 year old female with a past medial history of CVA, hypothyroidism, MVP, adenomatous colon polyps, GERD and dysphagia.  She has a prior history of dysphagia.  Her most recent EGD was 01/27/2018, the esophagus was normal but was empirically dilated.  Her swallowing symptoms improved for about 6 months then gradually recurred. She complains of having difficulty swallowing for the past 6 months. She drinks water when she eats to facilitate passing  foods down the esophagus. Meat is typically difficulty to swallow.  Food does not get stuck. She complains of having heartburn a few days weekly for the past few months. She stopped taking heartburn prior medication about 5 months ago. She is having upper abdominal pain for the past 3 months which occurs 2 days weekly. Eating doesn't improve or worsen her pain. Some nausea, no vomiting. Past cholecystectomy 15 years ago. She reports losing 5lbs over the past 6 months. She complains of having constipation. If she takes Miralax daily she develops diarrhea. She can go 2 weeks without passing a BM.  Amitiza was ineffective.  She is now passing very dark brown or black balls of stool. No Pepto bismal or po iron. Two weeks ago, stool was greenish in color. No bright red blood on the stool or toilet tissue. She saw her gyn and a rectal exam was done, her stool was heme +.  Her most recent colonoscopy was 03/06/2016 and 4 tubular adenomatous polyps were removed, see results below.  She was advised by Dr. Dorien Chihuahua to repeat a colonoscopy in 5 years.  No fever sweats or chills.  No weight loss.  No other complaints today.  EGD 01/27/2018:  - Normal esophagus. - Z-line irregular, 35 cm from the incisors. - No endoscopic esophageal abnormality to explain patient's dysphagia. Esophagus dilated. Dilated. - A few gastric polyps. These polyps appeared to be  hyperplastic and were left alone. - Normal duodenal bulb and second portion of the duodenum. - No specimens collected.  Esophagus was dilated in 2006.  She was found to have Schatzki's ring. EGD in April 2015 did not reveal esophageal abnormalities.   Barium swallow 11/18/2011: 1.  Diminished primary peristaltic wave with some esophageal atony. Consider connective tissue disorders such as scleroderma. 2.  Patulous gastroesophageal sphincter.  Moderate reflux. 3.  Pooling of barium in the vallecula which clears after subsequent swallows, of questionable significance.  Colonoscopy  03/06/2016: - Three small TA polyps in the transverse colon, in the ascending colon and in the cecum. - One 4 mm TA polyp in the sigmoid colon, removed with a cold snare. -5 year recall colonoscopy recommended  Past Medical History:  Diagnosis Date  . Cancer (HCC)    Skin Cancer, Squamous Cell  . Diastolic dysfunction 123456   Ejection fraction 75%.  . Dry eyes, bilateral   . Family history of anesthesia complication    Mother was hard to wake up after anesthesia  . Frequency of urination   . Heart murmur   . Hyperlipidemia    Diet controlled  . Hypothyroid   . Migraine 2013  . Mitral valve prolapse   . Stroke [facility])    3 episodes in past yr  . TIA (transient ischemic attack) 02/19/2012   Past Surgical History:  Procedure Laterality Date  . BALLOON DILATION N/A 07/28/2013   Procedure: BALLOON DILATION;  Surgeon: Rogene Houston, MD;  [facility];  Service: Endoscopy;  Laterality: N/A;  . BREAST BIOPSY     left benign tumor  . CATARACT EXTRACTION W/PHACO Right 07/11/2013   Procedure: CATARACT EXTRACTION PHACO AND INTRAOCULAR LENS PLACEMENT (IOC);  Surgeon: Tonny Branch, MD;  [facility];  Service: Ophthalmology;  Laterality: Right;  CDE 9.99  . CATARACT EXTRACTION W/PHACO Left 08/04/2013   Procedure: CATARACT EXTRACTION PHACO AND INTRAOCULAR LENS PLACEMENT (IOC);  Surgeon: Tonny Branch, MD;  [facility]: [facility];  Service: Ophthalmology;  Laterality: Left;  CDE:16.26  . CHOLECYSTECTOMY    . COLONOSCOPY  12/18/2010   Procedure: COLONOSCOPY;  Surgeon: Rogene Houston, MD;  [facility];  Service: Endoscopy;  Laterality: N/A;  1:00 pm  . COLONOSCOPY N/A 03/06/2016   Procedure: COLONOSCOPY;  Surgeon: Rogene Houston, MD;  [facility];  Service: Endoscopy;  Laterality: N/A;  1200  . ESOPHAGEAL DILATION N/A 01/27/2018   Procedure: ESOPHAGEAL DILATION;  Surgeon: Rogene Houston, MD;  [facility];  Service: Endoscopy;  Laterality: N/A;  . ESOPHAGOGASTRODUODENOSCOPY    . ESOPHAGOGASTRODUODENOSCOPY N/A 07/28/2013   Procedure: ESOPHAGOGASTRODUODENOSCOPY (EGD);  Surgeon: Rogene Houston, MD;  [facility];  Service: Endoscopy;  Laterality: N/A;  125  . ESOPHAGOGASTRODUODENOSCOPY N/A 01/27/2018   Procedure: ESOPHAGOGASTRODUODENOSCOPY (EGD);  Surgeon: Rogene Houston, MD;  [facility];  Service: Endoscopy;  Laterality: N/A;  2:00-moved to 10/30 @ 2:45pm per Lelon Frohlich  . LEFT HEART CATH AND CORONARY ANGIOGRAPHY N/A 07/17/2017   Procedure: LEFT HEART CATH AND CORONARY ANGIOGRAPHY;  Surgeon: Sherren Mocha, MD;  Location: [place] CV LAB;  Service: Cardiovascular;  Laterality: N/A;  . MALONEY DILATION N/A 07/28/2013   Procedure: Venia Minks DILATION;  Surgeon: Rogene Houston, MD;  [facility];  Service: Endoscopy;  Laterality: N/A;  . SAVORY DILATION N/A 07/28/2013   Procedure: SAVORY DILATION;  Surgeon: Rogene Houston, MD;  [facility];  Service: Endoscopy;  Laterality: N/A;  . THYROIDECTOMY  11/20/2011   Procedure: THYROIDECTOMY;  Surgeon: Izora Gala, MD;  Location: [place];  Service: ENT;  Laterality: Left;  LEFT THYROID LOBECTOMY  . TUBAL LIGATION     38 yrs ago.   Current Outpatient Medications on File Prior to Visit  Medication Sig Dispense Refill  . Carboxymethylcellul-Glycerin (LUBRICATING EYE DROPS OP) Place  1 drop into both eyes as needed (dry eyes).    . cholecalciferol (VITAMIN D) 1000 units tablet Take 1,000 Units by mouth daily at 12 noon.    . ezetimibe (ZETIA) 10 MG tablet Take 1 tablet (10 mg total) by mouth daily. 90 tablet 0  . levothyroxine (SYNTHROID) 75 MCG tablet TAKE 1 TABLET TUESDAY TO   SUNDAY AND 1/2 TABLET ON   MONDAY 88 tablet 1  . Multiple Vitamin (MULTI-VITAMIN DAILY PO) Take by mouth. Alive multi vit    . vitamin B-12 (CYANOCOBALAMIN) 1000 MCG tablet Take 1,000 mcg by mouth daily.    . vitamin C (ASCORBIC ACID) 500 MG tablet Take 500 mg by mouth daily.     No current facility-administered medications on file prior to visit.    Allergies  Allergen Reactions  . Compazine Other (See Comments)    Slurred speech  . Statins Other (See Comments)    myalgias  . Sulfa Drugs Cross Reactors Rash     Review of Systems see HPI, all other systems reviewed and are negative  Objective:   Physical Exam  BP 127/74   Pulse 70   Temp 98.7 F (37.1 C) (Oral)   Ht 5\' 2"  (1.575 m)   Wt 125 lb 9.6 oz (57 kg)   BMI 22.97 kg/m  General: 73 year old female well-developed in no acute distress Eyes: Sclera nonicteric, conjunctiva pink Mouth: Dentition in intact, no ulcers or lesions Heart: Regular rate and rhythm, no murmurs Lungs: Breath sounds clear throughout Neck: Supple, no lymphadenopathy, past partial thyroidectomy scar intact Abdomen: Soft, mild tenderness at the umbilicus and to the lower quadrant without rebound or guarding, positive bowel sounds to all 4 quadrants, liver border is palpated with deep inspiration but is not assessed to be enlarged Extremities: No edema Neuro: Alert and oriented x4, no focal deficits     Assessment & Plan:   1. GERD, dysphagia -Barium swallow with tablet to rule out esophageal dysmotility -EGD benefits and risk discussed including risk with sedation, risk of bleeding, perforation infection -Omeprazole 40 mg once daily -CBC and CMP   2.  History of colon polyps.  Stool heme positive.  Recent change in bowel pattern. -Colonoscopy benefits and risk discussed including risk with sedation, risk of bleeding, infection and perforation  3.  Constipation -I MiraLAX every other day if no improvement will then prescribe Linzess  Follow-up to be determined after the above evaluation completed

## 2019-01-20 ENCOUNTER — Encounter (INDEPENDENT_AMBULATORY_CARE_PROVIDER_SITE_OTHER): Payer: Self-pay | Admitting: *Deleted

## 2019-01-20 ENCOUNTER — Other Ambulatory Visit: Payer: Self-pay

## 2019-01-20 ENCOUNTER — Encounter (INDEPENDENT_AMBULATORY_CARE_PROVIDER_SITE_OTHER): Payer: Self-pay | Admitting: Nurse Practitioner

## 2019-01-20 ENCOUNTER — Telehealth (INDEPENDENT_AMBULATORY_CARE_PROVIDER_SITE_OTHER): Payer: Self-pay | Admitting: *Deleted

## 2019-01-20 ENCOUNTER — Ambulatory Visit (INDEPENDENT_AMBULATORY_CARE_PROVIDER_SITE_OTHER): Payer: Medicare HMO | Admitting: Nurse Practitioner

## 2019-01-20 VITALS — BP 127/74 | HR 70 | Temp 98.7°F | Ht 62.0 in | Wt 125.6 lb

## 2019-01-20 DIAGNOSIS — R195 Other fecal abnormalities: Secondary | ICD-10-CM | POA: Insufficient documentation

## 2019-01-20 DIAGNOSIS — R69 Illness, unspecified: Secondary | ICD-10-CM | POA: Diagnosis not present

## 2019-01-20 DIAGNOSIS — R1310 Dysphagia, unspecified: Secondary | ICD-10-CM | POA: Diagnosis not present

## 2019-01-20 DIAGNOSIS — K219 Gastro-esophageal reflux disease without esophagitis: Secondary | ICD-10-CM | POA: Diagnosis not present

## 2019-01-20 DIAGNOSIS — Z86010 Personal history of colonic polyps: Secondary | ICD-10-CM | POA: Diagnosis not present

## 2019-01-20 DIAGNOSIS — R1319 Other dysphagia: Secondary | ICD-10-CM

## 2019-01-20 MED ORDER — OMEPRAZOLE 40 MG PO CPDR: 40.0000 mg | DELAYED_RELEASE_CAPSULE | Freq: Every day | ORAL | 1 refills | Status: DC

## 2019-01-20 MED ORDER — PEG 3350-KCL-NA BICARB-NACL 420 G PO SOLR: 4000.0000 mL | Freq: Once | ORAL | 0 refills | Status: AC

## 2019-01-20 NOTE — Telephone Encounter (Signed)
Patient needs trilyte TCS/EGD sch'd 12/9

## 2019-01-20 NOTE — Patient Instructions (Addendum)
1.  Complete the ordered blood tests 2.  Schedule a barium swallow 3.  Schedule EGD and colonoscopy 4.  Start omeprazole 40 mg once daily to be taken 30 minutes before breakfast 5.  Further follow-up to be determined after the above evaluation completed 6.  Take MiraLAX every other day as tolerated if no improvement call our office and I will prescribe Linzess for constipation

## 2019-01-21 LAB — CBC WITH DIFFERENTIAL/PLATELET
Absolute Monocytes: 496 cells/uL (ref 200–950)
Basophils Absolute: 57 cells/uL (ref 0–200)
Basophils Relative: 1 %
Eosinophils Absolute: 80 cells/uL (ref 15–500)
Eosinophils Relative: 1.4 %
HCT: 40.8 % (ref 35.0–45.0)
Hemoglobin: 13.8 g/dL (ref 11.7–15.5)
Lymphs Abs: 1471 cells/uL (ref 850–3900)
MCH: 30.5 pg (ref 27.0–33.0)
MCHC: 33.8 g/dL (ref 32.0–36.0)
MCV: 90.3 fL (ref 80.0–100.0)
MPV: 10.8 fL (ref 7.5–12.5)
Monocytes Relative: 8.7 %
Neutro Abs: 3597 cells/uL (ref 1500–7800)
Neutrophils Relative %: 63.1 %
Platelets: 195 10*3/uL (ref 140–400)
RBC: 4.52 10*6/uL (ref 3.80–5.10)
RDW: 12.1 % (ref 11.0–15.0)
Total Lymphocyte: 25.8 %
WBC: 5.7 10*3/uL (ref 3.8–10.8)

## 2019-01-21 LAB — COMPLETE METABOLIC PANEL WITH GFR
AG Ratio: 1.5 (calc) (ref 1.0–2.5)
ALT: 17 U/L (ref 6–29)
AST: 22 U/L (ref 10–35)
Albumin: 4.1 g/dL (ref 3.6–5.1)
Alkaline phosphatase (APISO): 51 U/L (ref 37–153)
BUN: 11 mg/dL (ref 7–25)
CO2: 28 mmol/L (ref 20–32)
Calcium: 9.1 mg/dL (ref 8.6–10.4)
Chloride: 101 mmol/L (ref 98–110)
Creat: 0.68 mg/dL (ref 0.60–0.93)
GFR, Est African American: 101 mL/min/{1.73_m2} (ref >=60)
GFR, Est Non African American: 87 mL/min/{1.73_m2} (ref >=60)
Globulin: 2.8 g/dL (calc) (ref 1.9–3.7)
Glucose, Bld: 98 mg/dL (ref 65–99)
Potassium: 4.2 mmol/L (ref 3.5–5.3)
Sodium: 138 mmol/L (ref 135–146)
Total Bilirubin: 0.7 mg/dL (ref 0.2–1.2)
Total Protein: 6.9 g/dL (ref 6.1–8.1)

## 2019-01-24 ENCOUNTER — Other Ambulatory Visit: Payer: Self-pay

## 2019-01-24 ENCOUNTER — Ambulatory Visit (HOSPITAL_COMMUNITY)
Admission: RE | Admit: 2019-01-24 | Discharge: 2019-01-24 | Disposition: A | Discharge status: Home / Self Care | Payer: Medicare HMO | Source: Ambulatory Visit | Attending: Nurse Practitioner | Admitting: Nurse Practitioner

## 2019-01-24 ENCOUNTER — Other Ambulatory Visit (INDEPENDENT_AMBULATORY_CARE_PROVIDER_SITE_OTHER): Payer: Self-pay | Admitting: Nurse Practitioner

## 2019-01-24 DIAGNOSIS — R1310 Dysphagia, unspecified: Secondary | ICD-10-CM | POA: Insufficient documentation

## 2019-01-24 DIAGNOSIS — K219 Gastro-esophageal reflux disease without esophagitis: Secondary | ICD-10-CM | POA: Insufficient documentation

## 2019-01-24 DIAGNOSIS — R195 Other fecal abnormalities: Secondary | ICD-10-CM

## 2019-01-24 DIAGNOSIS — Z86010 Personal history of colonic polyps: Secondary | ICD-10-CM | POA: Insufficient documentation

## 2019-01-24 DIAGNOSIS — R1319 Other dysphagia: Secondary | ICD-10-CM

## 2019-02-14 ENCOUNTER — Telehealth (INDEPENDENT_AMBULATORY_CARE_PROVIDER_SITE_OTHER): Payer: Self-pay | Admitting: *Deleted

## 2019-02-14 NOTE — Telephone Encounter (Signed)
Ann, thank you for update, pls make sure pt has a colonoscopy recall 02/2021 and she can see Dr .Laural Golden at that point to discuss any further procedures. Thx

## 2019-02-14 NOTE — Telephone Encounter (Signed)
Patient left message to cancel TCS/EGD sch'd 03/09/19 - stated her issues are better and she doesn't feel like she need procedures now

## 2019-02-15 NOTE — Telephone Encounter (Signed)
TCS noted in recall for 02/2021

## 2019-03-07 ENCOUNTER — Other Ambulatory Visit (HOSPITAL_COMMUNITY): Payer: Medicare HMO

## 2019-03-09 ENCOUNTER — Other Ambulatory Visit: Payer: Self-pay

## 2019-03-09 ENCOUNTER — Encounter (HOSPITAL_COMMUNITY): Payer: Self-pay

## 2019-03-09 ENCOUNTER — Ambulatory Visit: Payer: Medicare HMO | Admitting: Family Medicine

## 2019-03-09 ENCOUNTER — Ambulatory Visit (HOSPITAL_COMMUNITY): Admit: 2019-03-09 | Payer: Medicare HMO | Admitting: Internal Medicine

## 2019-03-09 VITALS — BP 138/80 | Temp 97.6°F | Ht 62.0 in | Wt 126.0 lb

## 2019-03-09 DIAGNOSIS — M7120 Synovial cyst of popliteal space [Baker], unspecified knee: Secondary | ICD-10-CM | POA: Diagnosis not present

## 2019-03-09 DIAGNOSIS — E038 Other specified hypothyroidism: Secondary | ICD-10-CM | POA: Diagnosis not present

## 2019-03-09 DIAGNOSIS — R519 Headache, unspecified: Secondary | ICD-10-CM

## 2019-03-09 DIAGNOSIS — K219 Gastro-esophageal reflux disease without esophagitis: Secondary | ICD-10-CM | POA: Diagnosis not present

## 2019-03-09 SURGERY — EGD (ESOPHAGOGASTRODUODENOSCOPY)
Anesthesia: Moderate Sedation

## 2019-03-09 NOTE — Progress Notes (Signed)
   Subjective:    Patient ID: Grace [name], female    DOB: September 03, 1945, 73 y.o.   MRN: [id number]  Headache  This is a new problem. Episode onset: one week. The pain is located in the temporal region. Associated symptoms include blurred vision. Pertinent negatives include no abdominal pain or coughing. She has tried acetaminophen for the symptoms. The treatment provided mild relief.  Kidney 9 months and get that has passed Vision 20/20 bilateral.   Bilateral temple pain And back of neck Kept her up thurs night No vomiting Felt bad like something wasn't right Felt ok on Friday Sat ok Sun throbbing off and on took tylenol which helped Some throbbing on Teusday Can read but seems a little blurred No one sided weakness Sometimes voice gets hoarse but that has been off and on for weeks Balance ok Sun night bothered her But mon and tues night no bother    Review of Systems  Constitutional: Negative for activity change, appetite change and fatigue.  HENT: Negative for congestion.   Eyes: Positive for blurred vision.  Respiratory: Negative for cough.   Cardiovascular: Negative for chest pain.  Gastrointestinal: Negative for abdominal pain.  Skin: Negative for color change.  Neurological: Positive for headaches.  Psychiatric/Behavioral: Negative for behavioral problems.       Objective:   Physical Exam Temporal regions nontender but the area of her discomfort is just above that not painful tender currently lungs are clear respiratory rate normal heart regular no murmurs neurologic grossly normal no sign of stroke       Assessment & Plan:  Temporal headache Check sed rate May need scan if ongoing headaches More than likely musculoskeletal headache.  If not resolving over the next week consider further testing Tylenol as needed

## 2019-03-10 DIAGNOSIS — M791 Myalgia, unspecified site: Secondary | ICD-10-CM | POA: Insufficient documentation

## 2019-03-10 DIAGNOSIS — G720 Drug-induced myopathy: Secondary | ICD-10-CM | POA: Insufficient documentation

## 2019-03-10 LAB — SEDIMENTATION RATE: Sed Rate: 9 mm/hr (ref 0–40)

## 2019-05-23 ENCOUNTER — Telehealth: Payer: Self-pay | Admitting: Family Medicine

## 2019-05-23 ENCOUNTER — Other Ambulatory Visit: Payer: Self-pay | Admitting: *Deleted

## 2019-05-23 MED ORDER — LEVOTHYROXINE SODIUM 75 MCG PO TABS: ORAL_TABLET | ORAL | 0 refills | Status: DC

## 2019-05-23 NOTE — Telephone Encounter (Signed)
Refill sent per dr Nicki Reaper and pt was notified.

## 2019-05-23 NOTE — Telephone Encounter (Signed)
Pt is needing a refill on levothyroxine (SYNTHROID) 75 MCG tablet  [facility] - [place], [place] - [address] AT [address] TO [facility] [address]  Pt has follow up scheduled 3/16

## 2019-06-14 ENCOUNTER — Other Ambulatory Visit: Payer: Self-pay

## 2019-06-14 ENCOUNTER — Ambulatory Visit (INDEPENDENT_AMBULATORY_CARE_PROVIDER_SITE_OTHER): Payer: Medicare HMO | Admitting: Family Medicine

## 2019-06-14 DIAGNOSIS — E7800 Pure hypercholesterolemia, unspecified: Secondary | ICD-10-CM

## 2019-06-14 DIAGNOSIS — E7849 Other hyperlipidemia: Secondary | ICD-10-CM | POA: Diagnosis not present

## 2019-06-14 DIAGNOSIS — E038 Other specified hypothyroidism: Secondary | ICD-10-CM

## 2019-06-14 DIAGNOSIS — D510 Vitamin B12 deficiency anemia due to intrinsic factor deficiency: Secondary | ICD-10-CM | POA: Diagnosis not present

## 2019-06-14 DIAGNOSIS — R928 Other abnormal and inconclusive findings on diagnostic imaging of breast: Secondary | ICD-10-CM

## 2019-06-14 MED ORDER — EZETIMIBE 10 MG PO TABS: 10.0000 mg | ORAL_TABLET | Freq: Every day | ORAL | 1 refills | Status: DC

## 2019-06-14 MED ORDER — LEVOTHYROXINE SODIUM 75 MCG PO TABS: ORAL_TABLET | ORAL | 1 refills | Status: DC

## 2019-06-14 NOTE — Progress Notes (Signed)
Subjective:    Patient ID: Grace [name], female    DOB: Jan 29, 1946, 74 y.o.   MRN: [id number] Very nice patient Televideo HPI Pt needing follow up to get refills on Levothyroxine. Pt is taking 75 mcg Tuesday through Sunday and 1/2 tablet on Monday.  Patient takes her thyroid medicine regular basis.  States energy level overall doing well.  Pt is wanting to know if she should take an extra Vit D with the Alive vitamin she is taking.  She does take a multivitamin with vitamin D but this is not enough so therefore she will start doing 400 IU daily Other specified hypothyroidism - Plan: B12, Lipid Profile, Hepatic function panel, Basic Metabolic Panel (BMET), TSH, T4, free  Hypercholesterolemia - Plan: B12, Lipid Profile, Hepatic function panel, Basic Metabolic Panel (BMET), TSH, T4, free  Pernicious anemia - Plan: B12, Lipid Profile, Hepatic function panel, Basic Metabolic Panel (BMET), TSH, T4, free  Other hyperlipidemia - Plan: B12, Lipid Profile, Hepatic function panel, Basic Metabolic Panel (BMET), TSH, T4, free  Abnormal mammogram - Plan: B12, Lipid Profile, Hepatic function panel, Basic Metabolic Panel (BMET), TSH, T4, free, MM DIAG BREAST TOMO UNI LEFT, US BREAST LTD UNI LEFT INC AXILLA  Patient had concerns about abnormal mammogram she needs to have a follow-up mammogram to rule out other potential problems.  We did discuss how mammograms often can see benign issues that need to be followed  She is taking her B12 on a regular basis.  States her energy level overall doing well  She also has hyperlipidemia does not tolerate statins currently taking Zetia daily Virtual Visit via Telephone Note  I connected with Grace [name] on 06/14/19 at  8:30 AM EDT by telephone and verified that I am speaking with the correct person using two identifiers.  Location: Patient: home Provider: office   I discussed the limitations, risks, security and privacy concerns of performing an evaluation  and management service by telephone and the availability of in person appointments. I also discussed with the patient that there may be a patient responsible charge related to this service. The patient expressed understanding and agreed to proceed.   History of Present Illness:    Observations/Objective:   Assessment and Plan:   Follow Up Instructions:    I discussed the assessment and treatment plan with the patient. The patient was provided an opportunity to ask questions and all were answered. The patient agreed with the plan and demonstrated an understanding of the instructions.   The patient was advised to call back or seek an in-person evaluation if the symptoms worsen or if the condition fails to improve as anticipated.  I provided 30 minutes of non-face-to-face time during this encounter. This includes time spent with the patient discussing her multiple issues on the phone as well as from chart review as well as documentation and discussing appropriate testing      Review of Systems Denies any type of chest tightness pressure pain shortness of breath nausea vomiting diarrhea    Objective:   Physical Exam   Virtual visit unable to do physical exam     Assessment & Plan:  1. Other specified hypothyroidism Thyroid taking medication as directed energy level doing okay does need lab work ordered labs  2. Hypercholesterolemia Hyperlipidemia does not tolerate statins we will go with Zetia refills given labs ordered  3. Pernicious anemia Patient is taking 1000 mcg daily need to recheck B12 level  4. Other hyperlipidemia Patient encouraged  healthy eating regular activity strength training as well as cardio within reason  5. Abnormal mammogram Abnormal mammogram order mammogram await the results benign aspect of previous mammogram discussed patient not having any troubles currently  Bone density this coming fall In person visit later this summer

## 2019-06-28 ENCOUNTER — Ambulatory Visit (HOSPITAL_COMMUNITY)
Admission: RE | Admit: 2019-06-28 | Discharge: 2019-06-28 | Disposition: A | Discharge status: Home / Self Care | Payer: Medicare HMO | Source: Ambulatory Visit | Attending: Family Medicine | Admitting: Family Medicine

## 2019-06-28 ENCOUNTER — Ambulatory Visit (HOSPITAL_COMMUNITY): Admission: RE | Admit: 2019-06-28 | Payer: Medicare HMO | Source: Ambulatory Visit

## 2019-06-28 ENCOUNTER — Other Ambulatory Visit: Payer: Self-pay

## 2019-06-28 DIAGNOSIS — R928 Other abnormal and inconclusive findings on diagnostic imaging of breast: Secondary | ICD-10-CM | POA: Insufficient documentation

## 2019-06-30 ENCOUNTER — Other Ambulatory Visit: Payer: Self-pay

## 2019-06-30 ENCOUNTER — Ambulatory Visit: Payer: Medicare HMO | Admitting: Family Medicine

## 2019-06-30 VITALS — BP 128/72 | Temp 98.1°F | Wt 126.2 lb

## 2019-06-30 DIAGNOSIS — L259 Unspecified contact dermatitis, unspecified cause: Secondary | ICD-10-CM | POA: Diagnosis not present

## 2019-06-30 MED ORDER — PREDNISONE 20 MG PO TABS: ORAL_TABLET | ORAL | 0 refills | Status: DC

## 2019-06-30 MED ORDER — MOMETASONE FUROATE 0.1 % EX CREA: TOPICAL_CREAM | CUTANEOUS | 1 refills | Status: DC

## 2019-06-30 NOTE — Progress Notes (Signed)
   Subjective:    Patient ID: Grace [name], female    DOB: [birth year], 74 y.o.   MRN: [id number]  Rash This is a new problem. The current episode started 1 to 4 weeks ago. The problem has been gradually worsening since onset. The rash is characterized by itchiness and redness. She was exposed to an insect bite/sting. Past treatments include antihistamine. The treatment provided mild relief.   Patient received 1st and 2nd moderna vaccine. Has areas on her wrist elbows upper neck causes itching  Review of Systems  Skin: Positive for rash.  Denies fever chills sweats or other trouble     Objective:   Physical Exam Erythematous rash on the neck on the hands on the folds of the elbows.  Fall Risk  06/30/2019 06/14/2019 01/15/2018 03/03/2017 06/02/2016  Falls in the past year? 0 0 No No No  Number falls in past yr: - 0 - - -  Injury with Fall? - 0 - - -  Follow up - Falls evaluation completed - - -        Assessment & Plan:  Contact dermatitis Prednisone taper Steroid cream If ongoing troubles or problems notify us No need for allergy testing

## 2019-08-19 ENCOUNTER — Other Ambulatory Visit: Payer: Self-pay

## 2019-08-19 ENCOUNTER — Ambulatory Visit
Admission: EM | Admit: 2019-08-19 | Discharge: 2019-08-19 | Disposition: A | Discharge status: Home / Self Care | Payer: Medicare HMO | Attending: Family Medicine | Admitting: Family Medicine

## 2019-08-19 DIAGNOSIS — R0789 Other chest pain: Secondary | ICD-10-CM | POA: Diagnosis not present

## 2019-08-19 DIAGNOSIS — R6883 Chills (without fever): Secondary | ICD-10-CM

## 2019-08-19 DIAGNOSIS — R059 Cough, unspecified: Secondary | ICD-10-CM

## 2019-08-19 DIAGNOSIS — R519 Headache, unspecified: Secondary | ICD-10-CM

## 2019-08-19 DIAGNOSIS — R110 Nausea: Secondary | ICD-10-CM

## 2019-08-19 DIAGNOSIS — R05 Cough: Secondary | ICD-10-CM

## 2019-08-19 DIAGNOSIS — R5383 Other fatigue: Secondary | ICD-10-CM

## 2019-08-19 MED ORDER — KETOROLAC TROMETHAMINE 30 MG/ML IJ SOLN
30.0000 mg | Freq: Once | INTRAMUSCULAR | Status: AC
  Administered 2019-08-19: 30 mg via INTRAMUSCULAR

## 2019-08-19 MED ORDER — DEXAMETHASONE SODIUM PHOSPHATE 10 MG/ML IJ SOLN
10.0000 mg | Freq: Once | INTRAMUSCULAR | Status: AC
  Administered 2019-08-19: 10 mg via INTRAMUSCULAR

## 2019-08-19 MED ORDER — BENZONATATE 100 MG PO CAPS: 100.0000 mg | ORAL_CAPSULE | Freq: Three times a day (TID) | ORAL | 0 refills | Status: DC

## 2019-08-19 NOTE — ED Triage Notes (Signed)
Pt c/o chest tightness x 2-3 weeks, worse today, feels worse with a deep breath.   Pt c/o headache since last night. Mucinex sinus not helping.

## 2019-08-19 NOTE — ED Provider Notes (Signed)
[facility]   [id number] 08/19/19 Arrival Time: 1100   CC: COVID symptoms  SUBJECTIVE: History from: patient.  Grace [name] is a 74 y.o. female who presents with abrupt onset of nasal congestion, PND, persistent dry cough, chest tightness, and headache for the last day. Denies sick exposure to COVID, flu or strep. Denies recent travel. States that she has received both Covid vaccines. Has tried Mucinex without relief. There are no aggravating symptoms. Denies previous symptoms in the past. Denies fever, chills, fatigue, sinus pain, rhinorrhea, sore throat, SOB, wheezing, nausea, changes in bowel or bladder habits.    ROS: As per HPI.  All other pertinent ROS negative.     Past Medical History:  Diagnosis Date  . Cancer (HCC)    Skin Cancer, Squamous Cell  . Diastolic dysfunction 123456   Ejection fraction 75%.  . Dry eyes, bilateral   . Family history of anesthesia complication    Mother was hard to wake up after anesthesia  . Frequency of urination   . Heart murmur   . Hyperlipidemia    Diet controlled  . Hypothyroid   . Migraine 2013  . Mitral valve prolapse   . Stroke [facility])    3 episodes in past yr  . TIA (transient ischemic attack) 02/19/2012   Past Surgical History:  Procedure Laterality Date  . BALLOON DILATION N/A 07/28/2013   Procedure: BALLOON DILATION;  Surgeon: Rogene Houston, MD;  [facility];  Service: Endoscopy;  Laterality: N/A;  . BREAST BIOPSY     left benign tumor  . CATARACT EXTRACTION W/PHACO Right 07/11/2013   Procedure: CATARACT EXTRACTION PHACO AND INTRAOCULAR LENS PLACEMENT (IOC);  Surgeon: Tonny Branch, MD;  Location: [facility];  Service: Ophthalmology;  Laterality: Right;  CDE 9.99  . CATARACT EXTRACTION W/PHACO Left 08/04/2013   Procedure: CATARACT EXTRACTION PHACO AND INTRAOCULAR LENS PLACEMENT (IOC);  Surgeon: Tonny Branch, MD;  Location: [facility];  Service: Ophthalmology;  Laterality: Left;  CDE:16.26  . CHOLECYSTECTOMY    .  COLONOSCOPY  12/18/2010   Procedure: COLONOSCOPY;  Surgeon: Rogene Houston, MD;  [facility];  Service: Endoscopy;  Laterality: N/A;  1:00 pm  . COLONOSCOPY N/A 03/06/2016   Procedure: COLONOSCOPY;  Surgeon: Rogene Houston, MD;  [facility];  Service: Endoscopy;  Laterality: N/A;  1200  . ESOPHAGEAL DILATION N/A 01/27/2018   Procedure: ESOPHAGEAL DILATION;  Surgeon: Rogene Houston, MD;  [facility];  Service: Endoscopy;  Laterality: N/A;  . ESOPHAGOGASTRODUODENOSCOPY    . ESOPHAGOGASTRODUODENOSCOPY N/A 07/28/2013   Procedure: ESOPHAGOGASTRODUODENOSCOPY (EGD);  Surgeon: Rogene Houston, MD;  [facility];  Service: Endoscopy;  Laterality: N/A;  125  . ESOPHAGOGASTRODUODENOSCOPY N/A 01/27/2018   Procedure: ESOPHAGOGASTRODUODENOSCOPY (EGD);  Surgeon: Rogene Houston, MD;  [facility];  Service: Endoscopy;  Laterality: N/A;  2:00-moved to 10/30 @ 2:45pm per Lelon Frohlich  . LEFT HEART CATH AND CORONARY ANGIOGRAPHY N/A 07/17/2017   Procedure: LEFT HEART CATH AND CORONARY ANGIOGRAPHY;  Surgeon: Sherren Mocha, MD;  Location: [facility];  Service: Cardiovascular;  Laterality: N/A;  . MALONEY DILATION N/A 07/28/2013   Procedure: Venia Minks DILATION;  Surgeon: Rogene Houston, MD;  [facility];  Service: Endoscopy;  Laterality: N/A;  . SAVORY DILATION N/A 07/28/2013   Procedure: SAVORY DILATION;  Surgeon: Rogene Houston, MD;  [facility];  Service: Endoscopy;  Laterality: N/A;  . THYROIDECTOMY  11/20/2011  Procedure: THYROIDECTOMY;  Surgeon: Izora Gala, MD;  Location: [facility];  Service: ENT;  Laterality: Left;  LEFT THYROID LOBECTOMY  . TUBAL LIGATION     38 yrs ago.   Allergies  Allergen Reactions  . Compazine Other (See Comments)    Slurred speech  . Statins Other (See Comments)    myalgias  . Sulfa Drugs Cross Reactors Rash   No current facility-administered medications on file prior to encounter.   Current  Outpatient Medications on File Prior to Encounter  Medication Sig Dispense Refill  . Carboxymethylcellul-Glycerin (LUBRICATING EYE DROPS OP) Place 1 drop into both eyes as needed (dry eyes).    . cholecalciferol (VITAMIN D) 1000 units tablet Take 1,000 Units by mouth daily at 12 noon.    . ezetimibe (ZETIA) 10 MG tablet Take 1 tablet (10 mg total) by mouth daily. 90 tablet 1  . levothyroxine (SYNTHROID) 75 MCG tablet TAKE 1 TABLET TUESDAY TO   SUNDAY AND 1/2 TABLET ON   MONDAY 88 tablet 1  . mometasone (ELOCON) 0.1 % cream Apply to affected areas for 2 to 4 times a day for up to 10 days 45 g 1  . Multiple Vitamin (MULTI-VITAMIN DAILY PO) Take by mouth. Alive multi vit    . vitamin B-12 (CYANOCOBALAMIN) 1000 MCG tablet Take 1,000 mcg by mouth daily.     Social History   Socioeconomic History  . Marital status: Widowed    Spouse name: Not on file  . Number of children: Not on file  . Years of education: Not on file  . Highest education level: Not on file  Occupational History  . Not on file  Tobacco Use  . Smoking status: Never Smoker  . Smokeless tobacco: Never Used  Substance and Sexual Activity  . Alcohol use: No    Alcohol/week: 0.0 standard drinks  . Drug use: No  . Sexual activity: Not Currently  Other Topics Concern  . Not on file  Social History Narrative   caffeine- one soda daily, coffee, 1/2 cup   2 children   High school   Social Determinants of Health   Financial Resource Strain:   . Difficulty of Paying Living Expenses:   Food Insecurity:   . Worried About Charity fundraiser in the Last Year:   . Arboriculturist in the Last Year:   Transportation Needs:   . Film/video editor (Medical):   Marland Kitchen Lack of Transportation (Non-Medical):   Physical Activity:   . Days of Exercise per Week:   . Minutes of Exercise per Session:   Stress:   . Feeling of Stress :   Social Connections:   . Frequency of Communication with Friends and Family:   . Frequency of Social  Gatherings with Friends and Family:   . Attends Religious Services:   . Active Member of Clubs or Organizations:   . Attends Archivist Meetings:   Marland Kitchen Marital Status:   Intimate Partner Violence:   . Fear of Current or Ex-Partner:   . Emotionally Abused:   Marland Kitchen Physically Abused:   . Sexually Abused:    Family History  Problem Relation Age of Onset  . Heart disease Mother   . Hypertension Mother   . Stroke Mother   . Stroke Maternal Grandmother   . Cancer Paternal Grandmother        breast  . Colon cancer Neg Hx     OBJECTIVE:  Vitals:   08/19/19 1110  BP:  130/68  Pulse: 74  Resp: 16  Temp: 98.8 F (37.1 C)  SpO2: 97%     General appearance: alert; appears fatigued, but nontoxic; speaking in full sentences and tolerating own secretions HEENT: NCAT; Ears: EACs clear, TMs pearly gray; Eyes: PERRL.  EOM grossly intact. Sinuses: nontender; Nose: nares patent without rhinorrhea, Throat: oropharynx clear, tonsils non erythematous or enlarged, uvula midline  Neck: supple without LAD Lungs: unlabored respirations, symmetrical air entry; cough: mild; no respiratory distress; CTAB Heart: regular rate and rhythm.  Radial pulses 2+ symmetrical bilaterally Skin: warm and dry Psychological: alert and cooperative; normal mood and affect  LABS:  No results found for this or any previous visit (from the past 24 hour(s)).   ASSESSMENT & PLAN:  1. Cough   2. Chest tightness   3. Nonintractable headache, unspecified chronicity pattern, unspecified headache type   4. Chills   5. Other fatigue   6. Nausea without vomiting     Meds ordered this encounter  Medications  . ketorolac (TORADOL) 30 MG/ML injection 30 mg  . dexamethasone (DECADRON) injection 10 mg  . benzonatate (TESSALON) 100 MG capsule    Sig: Take 1 capsule (100 mg total) by mouth every 8 (eight) hours.    Dispense:  21 capsule    Refill:  0    Order Specific Question:   Supervising Provider    Answer:    Chase Picket [id number]       Patient should remain in quarantine until they have received culture results.  If negative you may resume normal activities (go back to work/school) while practicing hand hygiene, social distance, and mask wearing.  If positive, patient should remain in quarantine for 10 days from symptom onset AND greater than 72 hours after symptoms resolution (absence of fever without the use of fever-reducing medication and improvement in respiratory symptoms), whichever is longer Get plenty of rest and push fluids Use OTC zyrtec for nasal congestion, runny nose, and/or sore throat Use OTC flonase for nasal congestion and runny nose Use medications daily for symptom relief Use OTC medications like ibuprofen or tylenol as needed fever or pain Call or go to the ED if you have any new or worsening symptoms such as fever, worsening cough, shortness of breath, chest tightness, chest pain, turning blue, changes in mental status.    Reviewed expectations re: course of current medical issues. Questions answered. Outlined signs and symptoms indicating need for more acute intervention. Patient verbalized understanding. After Visit Summary given.         Faustino Congress, NP 08/19/19 1220

## 2019-08-19 NOTE — Discharge Instructions (Addendum)
Your EKG is not changed from your last one that was about 2 years ago.  Your COVID test is pending.  You should self quarantine until the test result is back.    Take Tylenol as needed for fever or discomfort.  Rest and keep yourself hydrated.    Go to the emergency department if you develop shortness of breath, severe diarrhea, high fever not relieved by Tylenol or ibuprofen, or other concerning symptoms.

## 2019-08-20 LAB — NOVEL CORONAVIRUS, NAA: SARS-CoV-2, NAA: NOT DETECTED

## 2019-08-20 LAB — SARS-COV-2, NAA 2 DAY TAT

## 2019-08-22 ENCOUNTER — Other Ambulatory Visit: Payer: Self-pay

## 2019-08-22 ENCOUNTER — Telehealth: Payer: Self-pay | Admitting: *Deleted

## 2019-08-22 ENCOUNTER — Telehealth (INDEPENDENT_AMBULATORY_CARE_PROVIDER_SITE_OTHER): Payer: Medicare HMO | Admitting: Family Medicine

## 2019-08-22 DIAGNOSIS — K529 Noninfective gastroenteritis and colitis, unspecified: Secondary | ICD-10-CM | POA: Diagnosis not present

## 2019-08-22 NOTE — Telephone Encounter (Signed)
[name], hetzer are scheduled for a virtual visit with your provider today.    Just as we do with appointments in the office, we must obtain your consent to participate.  Your consent will be active for this visit and any virtual visit you may have with one of our providers in the next 365 days.    If you have a MyChart account, I can also send a copy of this consent to you electronically.  All virtual visits are billed to your insurance company just like a traditional visit in the office.  As this is a virtual visit, video technology does not allow for your provider to perform a traditional examination.  This may limit your provider's ability to fully assess your condition.  If your provider identifies any concerns that need to be evaluated in person or the need to arrange testing such as labs, EKG, etc, we will make arrangements to do so.    Although advances in technology are sophisticated, we cannot ensure that it will always work on either your end or our end.  If the connection with a video visit is poor, we may have to switch to a telephone visit.  With either a video or telephone visit, we are not always able to ensure that we have a secure connection.   I need to obtain your verbal consent now.   Are you willing to proceed with your visit today?   Grace [name] has provided verbal consent on 08/22/2019 for a virtual visit (video or telephone).   Mitzie Na, RN 08/22/2019  8:53 AM

## 2019-08-22 NOTE — Progress Notes (Signed)
Patient ID: Grace [name], female    DOB: [birth year], 74 y.o.   MRN: [id number]  Virtual Visit via phone Note  I connected with Grace [name] on 08/22/19 at  9:00 AM EDT by a phone enabled telemedicine application and verified that I am speaking with the correct person using two identifiers.  Location: Patient: home Provider: office   I discussed the limitations of evaluation and management by telemedicine and the availability of in person appointments. The patient expressed understanding and agreed to proceed.  Chief Complaint  Patient presents with  . Diarrhea    since yesterday-terrible smell-patient also has nausea and doesnt want to eat   Subjective:    HPI Pt having diarrhea for past 2 days.  Noticing strong odor and black diarrhea.  Has been taking pepto-bismol day before.  Had 3 episodes of diarrhea yesterday and 1 episode today.  Has mild mid abdomen pain, intermittent. Was seen at urgent care 3 days ago for headache.  Not taking asa, ibuprofen or aleve.  Hasn't been near sick contacts at home, but went to an urgent care for other illness 3 days ago. Pt stating weighing herself today at 120 lbs, and normally weighs 123-125 lbs.  Not wanting to eat.  Drinking small amt fluids.  Has phenergan at home for nausea. No h/o gastric ulcers. No previous black or tarry stools.  denies fever or vomiting. No out of country travel.   Medical History Grace [name] has a past medical history of Cancer [facility]), Diastolic dysfunction (123456), Dry eyes, bilateral, Family history of anesthesia complication, Frequency of urination, Heart murmur, Hyperlipidemia, Hypothyroid, Migraine (2013), Mitral valve prolapse, Stroke ([facility]), and TIA (transient ischemic attack) (02/19/2012).   Outpatient Encounter Medications as of 08/22/2019  Medication Sig  . Carboxymethylcellul-Glycerin (LUBRICATING EYE DROPS OP) Place 1 drop into both eyes as needed (dry eyes).  . cholecalciferol (VITAMIN D) 1000 units  tablet Take 1,000 Units by mouth daily at 12 noon.  . ezetimibe (ZETIA) 10 MG tablet Take 1 tablet (10 mg total) by mouth daily.  Marland Kitchen levothyroxine (SYNTHROID) 75 MCG tablet TAKE 1 TABLET TUESDAY TO   SUNDAY AND 1/2 TABLET ON   MONDAY  . mometasone (ELOCON) 0.1 % cream Apply to affected areas for 2 to 4 times a day for up to 10 days  . Multiple Vitamin (MULTI-VITAMIN DAILY PO) Take by mouth. Alive multi vit  . vitamin B-12 (CYANOCOBALAMIN) 1000 MCG tablet Take 1,000 mcg by mouth daily.  . [DISCONTINUED] benzonatate (TESSALON) 100 MG capsule Take 1 capsule (100 mg total) by mouth every 8 (eight) hours.   No facility-administered encounter medications on file as of 08/22/2019.     Review of Systems  Constitutional: Positive for appetite change. Negative for chills, fatigue and fever.  HENT: Negative for congestion, rhinorrhea and sore throat.   Respiratory: Negative for cough.   Gastrointestinal: Positive for abdominal pain (mid abdomen), diarrhea and nausea. Negative for constipation and vomiting.  Genitourinary: Negative for difficulty urinating, dysuria and frequency.  Skin: Negative for rash.     Vitals There were no vitals taken for this visit.  Objective:   Physical Exam  No PE due to phone visit  Assessment and Plan   1. AGE (acute gastroenteritis)   Likely viral gastroenteritis. Use pepto bismol or imodium prn. Increase fluids.  Bland diet/brat diet. Tylenol prn for pain. Nausea- pt may take phenergan (she has at home prn.) If worsening pain, bright red blood or more profuse diarrhea to call  back. May need stool studies if not improving in next 2-3 days.   Follow Up Instructions:    I discussed the assessment and treatment plan with the patient. The patient was provided an opportunity to ask questions and all were answered. The patient agreed with the plan and demonstrated an understanding of the instructions.   The patient was advised to call back or seek an  in-person evaluation if the symptoms worsen or if the condition fails to improve as anticipated.  I provided 12 minutes of non-face-to-face time during this encounter.

## 2019-08-23 ENCOUNTER — Telehealth: Payer: Self-pay | Admitting: Family Medicine

## 2019-08-23 MED ORDER — LEVOTHYROXINE SODIUM 75 MCG PO TABS: ORAL_TABLET | ORAL | 0 refills | Status: DC

## 2019-08-23 NOTE — Telephone Encounter (Signed)
Pt is needing a refill on levothyroxine (SYNTHROID) 75 MCG tablet   [facility] - [place], [place] - [address] AT [address]AL TO [address]

## 2019-08-24 ENCOUNTER — Other Ambulatory Visit (HOSPITAL_COMMUNITY)
Admission: RE | Admit: 2019-08-24 | Discharge: 2019-08-24 | Disposition: A | Discharge status: Home / Self Care | Payer: Medicare HMO | Source: Ambulatory Visit | Attending: Family Medicine | Admitting: Family Medicine

## 2019-08-24 ENCOUNTER — Ambulatory Visit (INDEPENDENT_AMBULATORY_CARE_PROVIDER_SITE_OTHER): Payer: Medicare HMO | Admitting: Family Medicine

## 2019-08-24 ENCOUNTER — Other Ambulatory Visit: Payer: Self-pay

## 2019-08-24 ENCOUNTER — Telehealth: Payer: Self-pay | Admitting: Family Medicine

## 2019-08-24 VITALS — BP 140/70 | Temp 98.0°F | Wt 122.0 lb

## 2019-08-24 DIAGNOSIS — E7800 Pure hypercholesterolemia, unspecified: Secondary | ICD-10-CM

## 2019-08-24 DIAGNOSIS — R5383 Other fatigue: Secondary | ICD-10-CM | POA: Diagnosis not present

## 2019-08-24 DIAGNOSIS — R1013 Epigastric pain: Secondary | ICD-10-CM

## 2019-08-24 LAB — CBC WITH DIFFERENTIAL/PLATELET
Abs Immature Granulocytes: 0 10*3/uL (ref 0.00–0.07)
Basophils Absolute: 0 10*3/uL (ref 0.0–0.1)
Basophils Relative: 0 %
Eosinophils Absolute: 0 10*3/uL (ref 0.0–0.5)
Eosinophils Relative: 1 %
HCT: 40.3 % (ref 36.0–46.0)
Hemoglobin: 13.7 g/dL (ref 12.0–15.0)
Immature Granulocytes: 0 %
Lymphocytes Relative: 27 %
Lymphs Abs: 0.9 10*3/uL (ref 0.7–4.0)
MCH: 31.1 pg (ref 26.0–34.0)
MCHC: 34 g/dL (ref 30.0–36.0)
MCV: 91.4 fL (ref 80.0–100.0)
Monocytes Absolute: 0.4 10*3/uL (ref 0.1–1.0)
Monocytes Relative: 13 %
Neutro Abs: 1.9 10*3/uL (ref 1.7–7.7)
Neutrophils Relative %: 59 %
Platelets: 125 10*3/uL — ABNORMAL LOW (ref 150–400)
RBC: 4.41 MIL/uL (ref 3.87–5.11)
RDW: 12.2 % (ref 11.5–15.5)
WBC: 3.2 10*3/uL — ABNORMAL LOW (ref 4.0–10.5)
nRBC: 0 % (ref 0.0–0.2)

## 2019-08-24 LAB — LIPID PANEL
Cholesterol: 137 mg/dL (ref 0–200)
HDL: 35 mg/dL — ABNORMAL LOW (ref >40)
LDL Cholesterol: 67 mg/dL (ref 0–99)
Total CHOL/HDL Ratio: 3.9 RATIO
Triglycerides: 177 mg/dL — ABNORMAL HIGH (ref <150)
VLDL: 35 mg/dL (ref 0–40)

## 2019-08-24 LAB — BASIC METABOLIC PANEL
Anion gap: 8 (ref 5–15)
BUN: 8 mg/dL (ref 8–23)
CO2: 27 mmol/L (ref 22–32)
Calcium: 8.3 mg/dL — ABNORMAL LOW (ref 8.9–10.3)
Chloride: 100 mmol/L (ref 98–111)
Creatinine, Ser: 0.74 mg/dL (ref 0.44–1.00)
GFR calc Af Amer: >60 mL/min (ref >60)
GFR calc non Af Amer: >60 mL/min (ref >60)
Glucose, Bld: 105 mg/dL — ABNORMAL HIGH (ref 70–99)
Potassium: 3.9 mmol/L (ref 3.5–5.1)
Sodium: 135 mmol/L (ref 135–145)

## 2019-08-24 LAB — LIPASE, BLOOD: Lipase: 27 U/L (ref 11–51)

## 2019-08-24 LAB — AMYLASE: Amylase: 35 U/L (ref 28–100)

## 2019-08-24 MED ORDER — ONDANSETRON 4 MG PO TBDP
4.0000 mg | ORAL_TABLET | Freq: Three times a day (TID) | ORAL | 0 refills | Status: DC | PRN
Start: 2019-08-24 — End: 2021-07-15

## 2019-08-24 MED ORDER — PANTOPRAZOLE SODIUM 40 MG PO TBEC: DELAYED_RELEASE_TABLET | ORAL | 1 refills | Status: DC

## 2019-08-24 MED ORDER — PANTOPRAZOLE SODIUM 40 MG PO TBEC
DELAYED_RELEASE_TABLET | ORAL | 1 refills | Status: DC
Start: 2019-08-24 — End: 2019-08-24

## 2019-08-24 MED ORDER — ONDANSETRON 4 MG PO TBDP
4.0000 mg | ORAL_TABLET | Freq: Three times a day (TID) | ORAL | 0 refills | Status: DC | PRN
Start: 2019-08-24 — End: 2019-08-24

## 2019-08-24 MED ORDER — SUCRALFATE 1 G PO TABS
ORAL_TABLET | ORAL | 1 refills | Status: DC
Start: 2019-08-24 — End: 2019-12-19

## 2019-08-24 MED ORDER — SUCRALFATE 1 G PO TABS
ORAL_TABLET | ORAL | 1 refills | Status: DC
Start: 2019-08-24 — End: 2019-08-24

## 2019-08-24 NOTE — Telephone Encounter (Signed)
Pt states diarrhea started Monday and had visit on the 24th and told to call back today if not better and she thinks something else is going on with her stomach and wanted to see if some test could be run. States she is losing weight. Not able to eat due to nausea. Phenergan helps some. Every time she eats she has diarrhea about 30 mins later. Has diarrhea 2 -3 times a day. No blood in stool, did see a little bit of mucus in it today. No fever. States stool is a greenish color. Abdominal pain in mid abdomen. Tried phenergan and pepto.

## 2019-08-24 NOTE — Progress Notes (Signed)
   Subjective:    Patient ID: Grace [name], female    DOB: October 20, 1945, 74 y.o.   MRN: [id number]  Abdominal Pain This is a new problem. Episode onset: 1 week  The problem occurs constantly. The pain is located in the epigastric region. Associated symptoms include diarrhea, headaches, nausea and vomiting. Associated symptoms comments: Fatigue, weak. Treatments tried: pepto. The treatment provided no relief (Patient has no appetite.).      Review of Systems  Gastrointestinal: Positive for abdominal pain, diarrhea, nausea and vomiting.  Neurological: Positive for headaches.       Objective:   Physical Exam  Alert, nad  Lungs clear  Heart rrr  abd pos sig epigast tend, good bs's, no masses no guarding      Assessment & Plan:  Pt has two issues: aucte viral like presentation past week with first respiratory symtoms now followed by gi symtoms  :three months of progressive early satiety, epigast discomfort progressing to pain/tend (and remote hx of gastritis. Will ck bw for acute difficulties, initiate symp care plus carafate plus ppi and get a gi referral , numerous questions answered

## 2019-08-24 NOTE — Telephone Encounter (Signed)
Called pt and transferred to front to scheduled in office for today

## 2019-08-24 NOTE — Telephone Encounter (Signed)
Patient is still having diarrhea,had phone visit on 5/24 with Dr. Lovena Le and was told to call back today if not any better. Patient states still having diarrhea, she thinks there is something else going on with her stomach . Please advise [employer]

## 2019-08-24 NOTE — Telephone Encounter (Signed)
Needs appt in [place] to assess the abdomen.  Have her see other pcp today if possible. Thx, dr. Lovena Le

## 2019-08-25 ENCOUNTER — Other Ambulatory Visit: Payer: Self-pay | Admitting: *Deleted

## 2019-08-25 ENCOUNTER — Encounter: Payer: Self-pay | Admitting: Family Medicine

## 2019-08-25 DIAGNOSIS — R5383 Other fatigue: Secondary | ICD-10-CM

## 2019-09-24 LAB — CBC WITH DIFFERENTIAL/PLATELET
Basophils Absolute: 0.1 10*3/uL (ref 0.0–0.2)
Basos: 1 %
EOS (ABSOLUTE): 0.1 10*3/uL (ref 0.0–0.4)
Eos: 1 %
Hematocrit: 41.4 % (ref 34.0–46.6)
Hemoglobin: 14 g/dL (ref 11.1–15.9)
Immature Grans (Abs): 0 10*3/uL (ref 0.0–0.1)
Immature Granulocytes: 0 %
Lymphocytes Absolute: 1.9 10*3/uL (ref 0.7–3.1)
Lymphs: 22 %
MCH: 31 pg (ref 26.6–33.0)
MCHC: 33.8 g/dL (ref 31.5–35.7)
MCV: 92 fL (ref 79–97)
Monocytes Absolute: 0.8 10*3/uL (ref 0.1–0.9)
Monocytes: 9 %
Neutrophils Absolute: 5.7 10*3/uL (ref 1.4–7.0)
Neutrophils: 67 %
Platelets: 214 10*3/uL (ref 150–450)
RBC: 4.52 x10E6/uL (ref 3.77–5.28)
RDW: 12.3 % (ref 11.7–15.4)
WBC: 8.6 10*3/uL (ref 3.4–10.8)

## 2019-11-09 ENCOUNTER — Other Ambulatory Visit: Payer: Self-pay | Admitting: *Deleted

## 2019-11-09 ENCOUNTER — Telehealth: Payer: Self-pay | Admitting: Family Medicine

## 2019-11-09 MED ORDER — LEVOTHYROXINE SODIUM 75 MCG PO TABS: ORAL_TABLET | ORAL | 3 refills | Status: DC

## 2019-11-09 NOTE — Telephone Encounter (Signed)
Grace [name] needs refill on her levothyroxine (SYNTHROID) 75 MCG tablet [facility] [place], [place]   Pt call back [phone]

## 2019-11-17 ENCOUNTER — Telehealth: Payer: Self-pay | Admitting: Family Medicine

## 2019-11-17 NOTE — Telephone Encounter (Signed)
Please complete all the highlighted areas, sign & date order for [facility] & forward to [facility] to be documented & faxed  In red folder in basket on wall

## 2019-11-18 ENCOUNTER — Other Ambulatory Visit: Payer: Self-pay | Admitting: *Deleted

## 2019-11-18 DIAGNOSIS — Z780 Asymptomatic menopausal state: Secondary | ICD-10-CM

## 2019-11-18 DIAGNOSIS — M810 Age-related osteoporosis without current pathological fracture: Secondary | ICD-10-CM

## 2019-11-18 NOTE — Telephone Encounter (Signed)
Form was completed please forward back to [facility] for the patient to get her infusion  As for the patient she is due for a bone density test September 2021 please help set this up Please reinforce with the patient she should be taking calcium and vitamin D daily (Minimum 1200 mg calcium daily, minimum 800 IU vitamin D daily)  Please keep all regular follow-ups

## 2019-11-18 NOTE — Telephone Encounter (Signed)
Pt also wants to know which brand of calcium and vit d she should take and also she is taking alive and wants to know if pill or gummy is better. Pt was concerned about losing weight and appt was made to discuss weight loss for 9/20.

## 2019-11-18 NOTE — Telephone Encounter (Signed)
Bone density scheduled aph sept 17th at 2pm. Pt notified of appt.

## 2019-11-18 NOTE — Telephone Encounter (Signed)
Form given to [facility] for infusion.  Pt is due for bone density after 9/11. Pt has appt on the 13th. And going on vacation 24 -29. Any other day is good.  Pt also wants to know which brand of calcium and vit d she should take and also she is taking alive and wants to know if pill or gummy is better.

## 2019-11-21 ENCOUNTER — Ambulatory Visit (INDEPENDENT_AMBULATORY_CARE_PROVIDER_SITE_OTHER): Payer: Medicare HMO | Admitting: Gastroenterology

## 2019-11-21 NOTE — Telephone Encounter (Signed)
There is no specific calcium brand or vitamin D brand, pill or gummy does not matter  It is recommended to be on 1200 mg of calcium daily and 800 IUs vitamin D daily

## 2019-11-21 NOTE — Telephone Encounter (Signed)
Patient notified

## 2019-11-21 NOTE — Telephone Encounter (Signed)
Lmtc

## 2019-11-29 ENCOUNTER — Other Ambulatory Visit (HOSPITAL_COMMUNITY): Payer: Self-pay | Admitting: Family Medicine

## 2019-11-29 DIAGNOSIS — R921 Mammographic calcification found on diagnostic imaging of breast: Secondary | ICD-10-CM

## 2019-12-08 ENCOUNTER — Encounter (HOSPITAL_COMMUNITY)
Admission: RE | Admit: 2019-12-08 | Discharge: 2019-12-08 | Disposition: A | Discharge status: Home / Self Care | Payer: Medicare HMO | Attending: Family Medicine | Admitting: Family Medicine

## 2019-12-08 ENCOUNTER — Other Ambulatory Visit: Payer: Self-pay

## 2019-12-08 ENCOUNTER — Encounter (HOSPITAL_COMMUNITY): Payer: Self-pay

## 2019-12-08 DIAGNOSIS — M810 Age-related osteoporosis without current pathological fracture: Secondary | ICD-10-CM | POA: Diagnosis not present

## 2019-12-08 DIAGNOSIS — K30 Functional dyspepsia: Secondary | ICD-10-CM | POA: Insufficient documentation

## 2019-12-08 LAB — POCT I-STAT, CHEM 8
BUN: 10 mg/dL (ref 8–23)
Calcium, Ion: 1.27 mmol/L (ref 1.15–1.40)
Chloride: 95 mmol/L — ABNORMAL LOW (ref 98–111)
Creatinine, Ser: 0.8 mg/dL (ref 0.44–1.00)
Glucose, Bld: 78 mg/dL (ref 70–99)
HCT: 42 % (ref 36.0–46.0)
Hemoglobin: 14.3 g/dL (ref 12.0–15.0)
Potassium: 4.4 mmol/L (ref 3.5–5.1)
Sodium: 136 mmol/L (ref 135–145)
TCO2: 26 mmol/L (ref 22–32)

## 2019-12-08 MED ORDER — ZOLEDRONIC ACID 5 MG/100ML IV SOLN
5.0000 mg | Freq: Once | INTRAVENOUS | Status: AC
  Administered 2019-12-08: 5 mg via INTRAVENOUS

## 2019-12-08 MED ORDER — SODIUM CHLORIDE 0.9 % IV SOLN: Freq: Once | INTRAVENOUS | Status: AC

## 2019-12-12 ENCOUNTER — Other Ambulatory Visit: Payer: Self-pay

## 2019-12-12 ENCOUNTER — Ambulatory Visit (INDEPENDENT_AMBULATORY_CARE_PROVIDER_SITE_OTHER): Payer: Medicare HMO | Admitting: Gastroenterology

## 2019-12-12 ENCOUNTER — Encounter (INDEPENDENT_AMBULATORY_CARE_PROVIDER_SITE_OTHER): Payer: Self-pay | Admitting: Gastroenterology

## 2019-12-12 VITALS — BP 148/69 | HR 78 | Temp 98.2°F | Ht 62.0 in | Wt 123.4 lb

## 2019-12-12 DIAGNOSIS — R6881 Early satiety: Secondary | ICD-10-CM | POA: Diagnosis not present

## 2019-12-12 DIAGNOSIS — G8929 Other chronic pain: Secondary | ICD-10-CM

## 2019-12-12 DIAGNOSIS — R1013 Epigastric pain: Secondary | ICD-10-CM | POA: Diagnosis not present

## 2019-12-12 DIAGNOSIS — R110 Nausea: Secondary | ICD-10-CM | POA: Diagnosis not present

## 2019-12-12 DIAGNOSIS — E038 Other specified hypothyroidism: Secondary | ICD-10-CM

## 2019-12-12 LAB — HEPATIC FUNCTION PANEL
AG Ratio: 1.5 (calc) (ref 1.0–2.5)
ALT: 18 U/L (ref 6–29)
AST: 25 U/L (ref 10–35)
Albumin: 4.3 g/dL (ref 3.6–5.1)
Alkaline phosphatase (APISO): 68 U/L (ref 37–153)
Bilirubin, Direct: 0.1 mg/dL (ref 0.0–0.2)
Globulin: 2.9 g/dL (calc) (ref 1.9–3.7)
Indirect Bilirubin: 0.4 mg/dL (calc) (ref 0.2–1.2)
Total Bilirubin: 0.5 mg/dL (ref 0.2–1.2)
Total Protein: 7.2 g/dL (ref 6.1–8.1)

## 2019-12-12 LAB — THYROID PANEL WITH TSH
Free Thyroxine Index: 2.5 (ref 1.4–3.8)
T3 Uptake: 32 % (ref 22–35)
T4, Total: 7.7 ug/dL (ref 5.1–11.9)
TSH: 3.35 mIU/L (ref 0.40–4.50)

## 2019-12-12 NOTE — Progress Notes (Signed)
Patient profile: Grace [name] is a 74 y.o. female seen for follow up. Last seen in clinic 12/2018.   History of Present Illness: Grace [name] is seen today for nausea, early satiety, and abdominal pain.   Feels symptoms began about 4-5 months ago but per chart review may be going longer. Feels currently having force herself to eat and doesn't get hungry for foods, eating small portions and feels full after a few bites. No GERD or esophageal burning. She is no longer on PPI, stopped about 6 months-doesn't recall change in symptoms w/ stoping. Also believed she tried carafate prescribed by PCP in May 2021 but this did not help.    Reports constipation, has been on miralax which worked initially but then caused diarrhea w/ daily use. Currently having a small hard stool every other day. She denies any blood in stool. Mild lower abd pain that doesn't relate to meals or defecation.    Wt Readings from Last 3 Encounters:  12/12/19 123 lb 6.4 oz (56 kg)  12/08/19 121 lb 4.1 oz (55 kg)  08/24/19 122 lb (55.3 kg)  10/202-weight #125   Last Colonoscopy: 2017-- Three small polyps in the transverse colon, in the ascending colon and in the cecum. Biopsied. - One 4 mm polyp in the sigmoid colon, removed with a cold snare. Resected and Retrieved.  She had 4 small polyps removed. 2 are tubular adenomas.. Next colonoscopy in 5 years.   Last Endoscopy: 12/2017-- Normal esophagus. - Z-line irregular, 35 cm from the incisors. - No endoscopic esophageal abnormality to explain patient's dysphagia. Esophagus dilated. Dilated. - A few gastric polyps. These polyps appeared to be hyperplastic and were left alone. - Normal duodenal bulb and second portion of the duodenum. - No specimens collected.     Past Medical History:  Past Medical History:  Diagnosis Date  . Cancer (HCC)    Skin Cancer, Squamous Cell  . Diastolic dysfunction 16/12/9602   Ejection fraction 75%.  . Dry eyes, bilateral   .  Family history of anesthesia complication    Mother was hard to wake up after anesthesia  . Frequency of urination   . Heart murmur   . Hyperlipidemia    Diet controlled  . Hypothyroid   . Migraine 2013  . Mitral valve prolapse   . Stroke [facility])    3 episodes in past yr  . TIA (transient ischemic attack) 02/19/2012    Problem List: Patient Active Problem List   Diagnosis Date Noted  . Statin myopathy 03/10/2019  . Myalgia 03/10/2019  . Heme + stool 01/20/2019  . Esophageal dysphagia 12/22/2017  . Exertional angina ([place]) 07/17/2017  . History of colonic polyps 11/26/2015  . Genital herpes simplex type 2 05/14/2015  . Hyperlipemia 01/15/2015  . Pernicious anemia 11/04/2013  . Fatigue 09/19/2013  . Early satiety 07/27/2013  . Dysphagia, unspecified(787.20) 07/27/2013  . Loss of weight 07/18/2013  . Osteoporosis 01/25/2013  . GERD (gastroesophageal reflux disease) 11/16/2012  . Irritable bowel syndrome 11/16/2012  . Sinus bradycardia 02/20/2012  . Diastolic dysfunction 54/11/8117  . TIA (transient ischemic attack) 02/19/2012  . Heart murmur 02/19/2012  . Hypothyroidism 11/28/2010  . Hypercholesterolemia 11/28/2010    Past Surgical History: Past Surgical History:  Procedure Laterality Date  . BALLOON DILATION N/A 07/28/2013   Procedure: BALLOON DILATION;  Surgeon: Rogene Houston, MD;  [facility];  Service: Endoscopy;  Laterality: N/A;  . BREAST BIOPSY     left benign tumor  .  CATARACT EXTRACTION W/PHACO Right 07/11/2013   Procedure: CATARACT EXTRACTION PHACO AND INTRAOCULAR LENS PLACEMENT (IOC);  Surgeon: Tonny Branch, MD;  Location: [facility];  Service: Ophthalmology;  Laterality: Right;  CDE 9.99  . CATARACT EXTRACTION W/PHACO Left 08/04/2013   Procedure: CATARACT EXTRACTION PHACO AND INTRAOCULAR LENS PLACEMENT (IOC);  Surgeon: Tonny Branch, MD;  Location: [facility];  Service: Ophthalmology;  Laterality: Left;  CDE:16.26  . CHOLECYSTECTOMY    . COLONOSCOPY  12/18/2010    Procedure: COLONOSCOPY;  Surgeon: Rogene Houston, MD;  [facility];  Service: Endoscopy;  Laterality: N/A;  1:00 pm  . COLONOSCOPY N/A 03/06/2016   Procedure: COLONOSCOPY;  Surgeon: Rogene Houston, MD;  [facility];  Service: Endoscopy;  Laterality: N/A;  1200  . ESOPHAGEAL DILATION N/A 01/27/2018   Procedure: ESOPHAGEAL DILATION;  Surgeon: Rogene Houston, MD;  [facility];  Service: Endoscopy;  Laterality: N/A;  . ESOPHAGOGASTRODUODENOSCOPY    . ESOPHAGOGASTRODUODENOSCOPY N/A 07/28/2013   Procedure: ESOPHAGOGASTRODUODENOSCOPY (EGD);  Surgeon: Rogene Houston, MD;  [facility];  Service: Endoscopy;  Laterality: N/A;  125  . ESOPHAGOGASTRODUODENOSCOPY N/A 01/27/2018   Procedure: ESOPHAGOGASTRODUODENOSCOPY (EGD);  Surgeon: Rogene Houston, MD;  [facility];  Service: Endoscopy;  Laterality: N/A;  2:00-moved to 10/30 @ 2:45pm per Lelon Frohlich  . LEFT HEART CATH AND CORONARY ANGIOGRAPHY N/A 07/17/2017   Procedure: LEFT HEART CATH AND CORONARY ANGIOGRAPHY;  Surgeon: Sherren Mocha, MD;  Location: [place] [facility];  Service: Cardiovascular;  Laterality: N/A;  . MALONEY DILATION N/A 07/28/2013   Procedure: Venia Minks DILATION;  Surgeon: Rogene Houston, MD;  [facility];  Service: Endoscopy;  Laterality: N/A;  . SAVORY DILATION N/A 07/28/2013   Procedure: SAVORY DILATION;  Surgeon: Rogene Houston, MD;  [facility];  Service: Endoscopy;  Laterality: N/A;  . THYROIDECTOMY  11/20/2011   Procedure: THYROIDECTOMY;  Surgeon: Izora Gala, MD;  Location: [place];  Service: ENT;  Laterality: Left;  LEFT THYROID LOBECTOMY  . TUBAL LIGATION     38 yrs ago.    Allergies: Allergies  Allergen Reactions  . Compazine Other (See Comments)    Slurred speech  . Statins Other (See Comments)    myalgias  . Sulfa Drugs Cross Reactors Rash      Home Medications:  Current Outpatient Medications:  .  calcium carbonate (OSCAL) 1500 (600 Ca)  MG TABS tablet, Take by mouth 2 (two) times daily with a meal., Disp: , Rfl:  .  cholecalciferol (VITAMIN D) 1000 units tablet, Take 1,000 Units by mouth daily at 12 noon., Disp: , Rfl:  .  ezetimibe (ZETIA) 10 MG tablet, Take 1 tablet (10 mg total) by mouth daily., Disp: 90 tablet, Rfl: 1 .  levothyroxine (SYNTHROID) 75 MCG tablet, TAKE 1 TABLET TUESDAY TO   SUNDAY AND 1/2 TABLET ON   MONDAY, Disp: 88 tablet, Rfl: 3 .  Multiple Vitamin (MULTI-VITAMIN DAILY PO), Take by mouth. Alive multi vit, Disp: , Rfl:  .  ondansetron (ZOFRAN ODT) 4 MG disintegrating tablet, Take 1 tablet (4 mg total) by mouth every 8 (eight) hours as needed for nausea or vomiting., Disp: 24 tablet, Rfl: 0 .  vitamin B-12 (CYANOCOBALAMIN) 1000 MCG tablet, Take 1,000 mcg by mouth daily., Disp: , Rfl:  .  zoledronic acid (RECLAST) 5 MG/100ML SOLN injection, Inject 5 mg into the vein once. Yearly, Disp: , Rfl:  .  pantoprazole (PROTONIX) 40 MG tablet, Take 1  tablet every morning (Patient not taking: Reported on 12/12/2019), Disp: 30 tablet, Rfl: 1 .  sucralfate (CARAFATE) 1 g tablet, Take 1 AC in 1-2 oz of water (Patient not taking: Reported on 12/12/2019), Disp: 42 tablet, Rfl: 1   Family History: family history includes Cancer in her paternal grandmother; Heart disease in her mother; Hypertension in her mother; Stroke in her maternal grandmother and mother.    Social History:   reports that she has never smoked. She has never used smokeless tobacco. She reports that she does not drink alcohol and does not use drugs.   Review of Systems: Constitutional: Denies weight loss/weight gain  Eyes: No changes in vision. ENT: No oral lesions, sore throat.  GI: see HPI.  Heme/Lymph: No easy bruising.  CV: No chest pain.  GU: No hematuria.  Integumentary: No rashes.  Neuro: No headaches.  Psych: No depression/anxiety.  Endocrine: No heat/cold intolerance.  Allergic/Immunologic: No urticaria.  Resp: No cough, SOB.    Musculoskeletal: No joint swelling.    Physical Examination: BP (!) 148/69 (BP Location: Right Arm, Patient Position: Sitting, Cuff Size: Small)   Pulse 78   Temp 98.2 F (36.8 C) (Oral)   Ht 5\' 2"  (1.575 m)   Wt 123 lb 6.4 oz (56 kg)   BMI 22.57 kg/m  Gen: NAD, alert and oriented x 4 HEENT: PEERLA, EOMI, Neck: supple, no JVD Chest: CTA bilaterally, no wheezes, crackles, or other adventitious sounds CV: RRR, no m/g/c/r Abd: soft, NT, ND, +BS in all four quadrants; no HSM, guarding, ridigity, or rebound tenderness Ext: no edema, well perfused with 2+ pulses, Skin: no rash or lesions noted on observed skin Lymph: no noted LAD  Data Reviewed:   07/2019--CBC normal, lipase normal.    Oct 2020 1. No CT findings of the abdomen or pelvis to explain weight loss, abdominal pain, nausea, or vomiting.  2.  Moderate burden of stool in the colon rectum.  3. Chronic, incidental, and postoperative findings as detailed above.  Assessment/Plan: Ms. Harbold is a 74 y.o. female seen for follow-up  1.  Nausea/early satiety-EGD 2019 unremarkable and CT last year unremarkable. She has been on a PPI and Carafate which she reports have not helped symptoms.  Will check thyroid labs as she is on Synthroid and last TSH was over a year ago.  We will also repeat her LFTs last done over a year ago.  She does endorse some symptoms suggestive of possible gastroparesis and will check a gastric emptying scan. Further recs pending GES results.    2. Mild constipation - miralax daily caused diarrhea, she will try stool softener daily instead. Due for colonoscopy next year.   Ezrah was seen today for abdominal pain.  Diagnoses and all orders for this visit:  Other specified hypothyroidism -     Thyroid Panel With TSH  Early satiety -     NM Gastric Emptying; Future -     Hepatic function panel  Nausea without vomiting -     NM Gastric Emptying; Future -     Hepatic function panel  Abdominal  pain, chronic, epigastric -     NM Gastric Emptying; Future -     Hepatic function panel  Other orders -     Hepatic function panel    25 min total pt care time, >50% face to face    I personally performed the service, non-incident to. (WP)  Laurine Blazer, [facility]

## 2019-12-12 NOTE — Patient Instructions (Signed)
Try stool softener such as colace - take 1-2x/day based on bowels. Please let me know if you have tried carafate in the past.

## 2019-12-16 ENCOUNTER — Other Ambulatory Visit: Payer: Self-pay

## 2019-12-16 ENCOUNTER — Ambulatory Visit (HOSPITAL_COMMUNITY)
Admission: RE | Admit: 2019-12-16 | Discharge: 2019-12-16 | Disposition: A | Discharge status: Home / Self Care | Payer: Medicare HMO | Source: Ambulatory Visit | Attending: Family Medicine | Admitting: Family Medicine

## 2019-12-16 DIAGNOSIS — Z780 Asymptomatic menopausal state: Secondary | ICD-10-CM | POA: Insufficient documentation

## 2019-12-16 DIAGNOSIS — M810 Age-related osteoporosis without current pathological fracture: Secondary | ICD-10-CM | POA: Diagnosis present

## 2019-12-19 ENCOUNTER — Other Ambulatory Visit: Payer: Self-pay

## 2019-12-19 ENCOUNTER — Encounter: Payer: Self-pay | Admitting: Family Medicine

## 2019-12-19 ENCOUNTER — Ambulatory Visit: Payer: Medicare HMO | Admitting: Family Medicine

## 2019-12-19 VITALS — BP 132/74 | HR 81 | Temp 97.6°F | Ht 62.0 in | Wt 124.0 lb

## 2019-12-19 DIAGNOSIS — R519 Headache, unspecified: Secondary | ICD-10-CM

## 2019-12-19 DIAGNOSIS — M810 Age-related osteoporosis without current pathological fracture: Secondary | ICD-10-CM | POA: Diagnosis not present

## 2019-12-19 DIAGNOSIS — G459 Transient cerebral ischemic attack, unspecified: Secondary | ICD-10-CM

## 2019-12-19 DIAGNOSIS — Z23 Encounter for immunization: Secondary | ICD-10-CM | POA: Diagnosis not present

## 2019-12-19 DIAGNOSIS — R6881 Early satiety: Secondary | ICD-10-CM

## 2019-12-19 DIAGNOSIS — R634 Abnormal weight loss: Secondary | ICD-10-CM

## 2019-12-19 NOTE — Progress Notes (Signed)
Subjective:    Patient ID: Grace [name], female    DOB: [birth year], 74 y.o.   MRN: [id number]  HPIpt wants to discuss weight loss. Patient was at 124 pounds a year and a half ago her weight went all the way up to 134 but recently is gone down to 124 again she finds her self eating just a small amount in relates early satiety.  She denies severe abdominal pain is currently seeing gastroenterology for further evaluation  Pt thinks she had a mini stroke on September 4th. Was light headed, blurry vision, could not pronounce any words, headache. Went and laid down for about 15 mins and after about 30 mins felt fine.  Patient states that during that span of time she was unable to put words together felt it is very difficult to speak and this lasted approximately 30 minutes.  Denied any unilateral numbness weakness  Would like a flu vaccine today. Right side headache intermittently over the past couple weeks TIA sx Diff speech for 15 to 20 min  Review of Systems  Constitutional: Negative for activity change, appetite change and fatigue.  HENT: Negative for congestion and rhinorrhea.   Respiratory: Negative for cough and shortness of breath.   Cardiovascular: Negative for chest pain and leg swelling.  Gastrointestinal: Negative for abdominal pain and diarrhea.  Endocrine: Negative for polydipsia and polyphagia.  Skin: Negative for color change.  Neurological: Negative for dizziness, weakness and headaches.  Psychiatric/Behavioral: Negative for behavioral problems and confusion.       Objective:   Physical Exam Vitals reviewed.  Constitutional:      General: She is not in acute distress. HENT:     Head: Normocephalic and atraumatic.  Eyes:     General:        Right eye: No discharge.        Left eye: No discharge.  Neck:     Trachea: No tracheal deviation.  Cardiovascular:     Rate and Rhythm: Normal rate and regular rhythm.     Heart sounds: Normal heart sounds. No murmur heard.    Pulmonary:     Effort: Pulmonary effort is normal. No respiratory distress.     Breath sounds: Normal breath sounds.  Lymphadenopathy:     Cervical: No cervical adenopathy.  Skin:    General: Skin is warm and dry.  Neurological:     Mental Status: She is alert.     Coordination: Coordination normal.  Psychiatric:        Behavior: Behavior normal.   Neurologically normal exam today        Assessment & Plan:  1. Need for vaccination Flu shot today - Flu Vaccine QUAD High Dose(Fluad)  2. TIA (transient ischemic attack) Possible TIA I doubt stroke but cannot rule this out patient would benefit from MRI of the brain as well as carotid Doppler warning signs regarding if this should occur what to do was discussed in detail - MR Brain Wo Contrast - US Carotid Duplex Bilateral  3. Acute nonintractable headache, unspecified headache type This headache comes and goes MRI will help make sure that there is not a tumor related with this - MR Brain Wo Contrast - US Carotid Duplex Bilateral  4. Osteoporosis, unspecified osteoporosis type, unspecified pathological fracture presence Patient is on Reclast yearly.  Still has osteoporosis not as severe as it was has been on it 5 to 6 years but the risk of fracture outweighs the risk of atypical fracture I  would recommend continuing the medicine for now Will touch base with endocrinology to see their opinion regarding onward use of Reclast  5. Early satiety Following by GI they will be doing a gastric emptying study.  6. Weight loss Lab work overall has looked good.  Await further findings of GI

## 2019-12-20 ENCOUNTER — Telehealth (INDEPENDENT_AMBULATORY_CARE_PROVIDER_SITE_OTHER): Payer: Self-pay | Admitting: Gastroenterology

## 2019-12-20 ENCOUNTER — Ambulatory Visit (HOSPITAL_COMMUNITY)
Admission: RE | Admit: 2019-12-20 | Discharge: 2019-12-20 | Disposition: A | Discharge status: Home / Self Care | Payer: Medicare HMO | Source: Ambulatory Visit | Attending: Family Medicine | Admitting: Family Medicine

## 2019-12-20 DIAGNOSIS — R921 Mammographic calcification found on diagnostic imaging of breast: Secondary | ICD-10-CM

## 2019-12-20 NOTE — Telephone Encounter (Signed)
Patient called wanted to let you know that she still has some generic Carafate and she has some protonix  - please advise   -  [phone]

## 2019-12-21 NOTE — Telephone Encounter (Signed)
Patient is aware of all. CLS 12/21/2019

## 2019-12-21 NOTE — Telephone Encounter (Signed)
I called and patient vm is full. CLS 12/21/2019

## 2019-12-21 NOTE — Telephone Encounter (Signed)
Please notify patient she can take Protonix 40 mg in the morning, if she did not feel the Carafate helped symptoms does not have to restart.  She has a gastric emptying scan arranged for tomorrow for further evaluation.

## 2019-12-22 ENCOUNTER — Other Ambulatory Visit: Payer: Self-pay | Admitting: *Deleted

## 2019-12-22 ENCOUNTER — Other Ambulatory Visit: Payer: Self-pay

## 2019-12-22 ENCOUNTER — Encounter (HOSPITAL_COMMUNITY): Payer: Self-pay

## 2019-12-22 ENCOUNTER — Encounter (HOSPITAL_COMMUNITY)
Admission: RE | Admit: 2019-12-22 | Discharge: 2019-12-22 | Disposition: A | Discharge status: Home / Self Care | Payer: Medicare HMO | Source: Ambulatory Visit | Attending: Gastroenterology | Admitting: Gastroenterology

## 2019-12-22 DIAGNOSIS — G8929 Other chronic pain: Secondary | ICD-10-CM | POA: Diagnosis present

## 2019-12-22 DIAGNOSIS — R1013 Epigastric pain: Secondary | ICD-10-CM | POA: Diagnosis present

## 2019-12-22 DIAGNOSIS — R110 Nausea: Secondary | ICD-10-CM | POA: Insufficient documentation

## 2019-12-22 DIAGNOSIS — Z780 Asymptomatic menopausal state: Secondary | ICD-10-CM

## 2019-12-22 DIAGNOSIS — R6881 Early satiety: Secondary | ICD-10-CM | POA: Insufficient documentation

## 2019-12-22 DIAGNOSIS — Z13820 Encounter for screening for osteoporosis: Secondary | ICD-10-CM

## 2019-12-22 MED ORDER — TECHNETIUM TC 99M SULFUR COLLOID
2.0000 | Freq: Once | INTRAVENOUS | Status: AC | PRN
  Administered 2019-12-22: 2.2 via ORAL

## 2019-12-27 ENCOUNTER — Encounter: Payer: Self-pay | Admitting: Family Medicine

## 2019-12-29 ENCOUNTER — Other Ambulatory Visit (INDEPENDENT_AMBULATORY_CARE_PROVIDER_SITE_OTHER): Payer: Self-pay | Admitting: Gastroenterology

## 2019-12-29 MED ORDER — METOCLOPRAMIDE HCL 5 MG PO TABS: 5.0000 mg | ORAL_TABLET | Freq: Three times a day (TID) | ORAL | 1 refills | Status: DC

## 2019-12-29 NOTE — Progress Notes (Signed)
Call patient with her results of GES. Severely delayed gastric emptying. She is having nausea and early satiety, will go ahead and send reglan to her pharmacy. We did discuss the side effect of tardive dyskinesia and she is to contact me if she develops this immediately and stop the medication.   Mitzie - please schedule f/up OV w/ me in 4 weeks. Thanks.

## 2020-01-06 ENCOUNTER — Encounter (INDEPENDENT_AMBULATORY_CARE_PROVIDER_SITE_OTHER): Payer: Self-pay

## 2020-01-06 ENCOUNTER — Telehealth (INDEPENDENT_AMBULATORY_CARE_PROVIDER_SITE_OTHER): Payer: Self-pay

## 2020-01-06 DIAGNOSIS — Z1211 Encounter for screening for malignant neoplasm of colon: Secondary | ICD-10-CM

## 2020-01-06 MED ORDER — PLENVU 140 G PO SOLR: 1.0000 | Freq: Once | ORAL | 0 refills | Status: AC

## 2020-01-06 NOTE — Telephone Encounter (Signed)
Patient needs Plenvu (copay card) ° °

## 2020-01-08 ENCOUNTER — Telehealth: Payer: Self-pay | Admitting: Family Medicine

## 2020-01-08 NOTE — Telephone Encounter (Signed)
Nurses Recently the patient was in the office to discuss her osteoporosis I was concerned about how severe her osteoporosis is despite [place] utilizing medicine over the past 6 years Please let her know that I did communicate with endocrinology which can help with complex cases of osteoporosis. I believe it is in the patient's best interest to see Dr. Dorris Fetch (Please explain to the patient that Dr. Dorris Fetch is a local endocrinologist who can help with significant osteoporosis.  He will help guide what would be the best approach at this point.  More than likely they will utilize a different approach) Please go ahead with referral for osteoporosis to Dr. Dorris Fetch

## 2020-01-09 ENCOUNTER — Other Ambulatory Visit: Payer: Self-pay | Admitting: *Deleted

## 2020-01-09 DIAGNOSIS — M810 Age-related osteoporosis without current pathological fracture: Secondary | ICD-10-CM

## 2020-01-09 NOTE — Progress Notes (Signed)
mb ref

## 2020-01-09 NOTE — Telephone Encounter (Signed)
Patient notified of Dr. Bary Leriche recommendation and referral was put in epic.

## 2020-01-16 ENCOUNTER — Ambulatory Visit (HOSPITAL_COMMUNITY)
Admission: RE | Admit: 2020-01-16 | Discharge: 2020-01-16 | Disposition: A | Discharge status: Home / Self Care | Payer: Medicare HMO | Source: Ambulatory Visit | Attending: Family Medicine | Admitting: Family Medicine

## 2020-01-16 ENCOUNTER — Other Ambulatory Visit: Payer: Self-pay

## 2020-01-16 ENCOUNTER — Ambulatory Visit (HOSPITAL_COMMUNITY): Payer: Medicare HMO

## 2020-01-16 DIAGNOSIS — R519 Headache, unspecified: Secondary | ICD-10-CM | POA: Insufficient documentation

## 2020-01-16 DIAGNOSIS — G459 Transient cerebral ischemic attack, unspecified: Secondary | ICD-10-CM | POA: Insufficient documentation

## 2020-01-17 ENCOUNTER — Encounter: Payer: Self-pay | Admitting: "Endocrinology

## 2020-01-17 ENCOUNTER — Ambulatory Visit (INDEPENDENT_AMBULATORY_CARE_PROVIDER_SITE_OTHER): Payer: Medicare HMO | Admitting: "Endocrinology

## 2020-01-17 VITALS — BP 122/56 | HR 56 | Ht 62.0 in | Wt 125.8 lb

## 2020-01-17 DIAGNOSIS — M816 Localized osteoporosis [Lequesne]: Secondary | ICD-10-CM

## 2020-01-17 MED ORDER — CALCIUM CARBONATE 1250 (500 CA) MG PO TABS: 1.0000 | ORAL_TABLET | Freq: Two times a day (BID) | ORAL | 3 refills | Status: DC

## 2020-01-17 NOTE — Progress Notes (Signed)
01/17/2020      Endocrinology Consult Note  Past Medical History:  Diagnosis Date  . Cancer (HCC)    Skin Cancer, Squamous Cell  . Diastolic dysfunction 37/34/2876   Ejection fraction 75%.  . Dry eyes, bilateral   . Family history of anesthesia complication    Mother was hard to wake up after anesthesia  . Frequency of urination   . Heart murmur   . Hyperlipidemia    Diet controlled  . Hypothyroid   . Migraine 2013  . Mitral valve prolapse   . Stroke [facility])    3 episodes in past yr  . TIA (transient ischemic attack) 02/19/2012   Past Surgical History:  Procedure Laterality Date  . BALLOON DILATION N/A 07/28/2013   Procedure: BALLOON DILATION;  Surgeon: Rogene Houston, MD;  [facility];  Service: Endoscopy;  Laterality: N/A;  . BREAST BIOPSY     left benign tumor  . CATARACT EXTRACTION W/PHACO Right 07/11/2013   Procedure: CATARACT EXTRACTION PHACO AND INTRAOCULAR LENS PLACEMENT (IOC);  Surgeon: Tonny Branch, MD;  Location: [facility];  Service: Ophthalmology;  Laterality: Right;  CDE 9.99  . CATARACT EXTRACTION W/PHACO Left 08/04/2013   Procedure: CATARACT EXTRACTION PHACO AND INTRAOCULAR LENS PLACEMENT (IOC);  Surgeon: Tonny Branch, MD;  Location: [facility];  Service: Ophthalmology;  Laterality: Left;  CDE:16.26  . CHOLECYSTECTOMY    . COLONOSCOPY  12/18/2010   Procedure: COLONOSCOPY;  Surgeon: Rogene Houston, MD;  [facility];  Service: Endoscopy;  Laterality: N/A;  1:00 pm  . COLONOSCOPY N/A 03/06/2016   Procedure: COLONOSCOPY;  Surgeon: Rogene Houston, MD;  [facility];  Service: Endoscopy;  Laterality: N/A;  1200  . ESOPHAGEAL DILATION N/A 01/27/2018   Procedure: ESOPHAGEAL DILATION;  Surgeon: Rogene Houston, MD;  [facility];  Service: Endoscopy;  Laterality: N/A;  . ESOPHAGOGASTRODUODENOSCOPY    . ESOPHAGOGASTRODUODENOSCOPY N/A 07/28/2013   Procedure:  ESOPHAGOGASTRODUODENOSCOPY (EGD);  Surgeon: Rogene Houston, MD;  [facility];  Service: Endoscopy;  Laterality: N/A;  125  . ESOPHAGOGASTRODUODENOSCOPY N/A 01/27/2018   Procedure: ESOPHAGOGASTRODUODENOSCOPY (EGD);  Surgeon: Rogene Houston, MD;  [facility];  Service: Endoscopy;  Laterality: N/A;  2:00-moved to 10/30 @ 2:45pm per Lelon Frohlich  . LEFT HEART CATH AND CORONARY ANGIOGRAPHY N/A 07/17/2017   Procedure: LEFT HEART CATH AND CORONARY ANGIOGRAPHY;  Surgeon: Sherren Mocha, MD;  Location: [facility];  Service: Cardiovascular;  Laterality: N/A;  . MALONEY DILATION N/A 07/28/2013   Procedure: Venia Minks DILATION;  Surgeon: Rogene Houston, MD;  [facility];  Service: Endoscopy;  Laterality: N/A;  . SAVORY DILATION N/A 07/28/2013   Procedure: SAVORY DILATION;  Surgeon: Rogene Houston, MD;  [facility];  Service: Endoscopy;  Laterality: N/A;  . THYROIDECTOMY  11/20/2011   Procedure: THYROIDECTOMY;  Surgeon: Izora Gala, MD;  Location: [facility];  Service: ENT;  Laterality: Left;  LEFT THYROID LOBECTOMY  . TUBAL LIGATION     39 yrs ago.   Social History   Socioeconomic History  . Marital status: Widowed    Spouse name: Not  on file  . Number of children: Not on file  . Years of education: Not on file  . Highest education level: Not on file  Occupational History  . Not on file  Tobacco Use  . Smoking status: Never Smoker  . Smokeless tobacco: Never Used  Vaping Use  . Vaping Use: Never used  Substance and Sexual Activity  . Alcohol use: No    Alcohol/week: 0.0 standard drinks  . Drug use: No  . Sexual activity: Not Currently  Other Topics Concern  . Not on file  Social History Narrative   caffeine- one soda daily, coffee, 1/2 cup   2 children   High school   Social Determinants of Health   Financial Resource Strain:   . Difficulty of Paying Living Expenses: Not on file  Food Insecurity:   . Worried About Charity fundraiser in the  Last Year: Not on file  . Ran Out of Food in the Last Year: Not on file  Transportation Needs:   . Lack of Transportation (Medical): Not on file  . Lack of Transportation (Non-Medical): Not on file  Physical Activity:   . Days of Exercise per Week: Not on file  . Minutes of Exercise per Session: Not on file  Stress:   . Feeling of Stress : Not on file  Social Connections:   . Frequency of Communication with Friends and Family: Not on file  . Frequency of Social Gatherings with Friends and Family: Not on file  . Attends Religious Services: Not on file  . Active Member of Clubs or Organizations: Not on file  . Attends Archivist Meetings: Not on file  . Marital Status: Not on file   Outpatient Encounter Medications as of 01/17/2020  Medication Sig  . Multiple Vitamins-Minerals (ALIVE WOMENS 50+ PO) Take 1 tablet by mouth daily.  . calcium carbonate (OS-CAL - DOSED IN MG OF ELEMENTAL CALCIUM) 1250 (500 Ca) MG tablet Take 1 tablet (500 mg of elemental calcium total) by mouth 2 (two) times daily with a meal.  . cholecalciferol (VITAMIN D) 1000 units tablet Take 1,000 Units by mouth daily at 12 noon.  . ezetimibe (ZETIA) 10 MG tablet Take 1 tablet (10 mg total) by mouth daily.  Marland Kitchen levothyroxine (SYNTHROID) 75 MCG tablet TAKE 1 TABLET TUESDAY TO   SUNDAY AND 1/2 TABLET ON   MONDAY  . metoCLOPramide (REGLAN) 5 MG tablet Take 1 tablet (5 mg total) by mouth 4 (four) times daily -  before meals and at bedtime.  . ondansetron (ZOFRAN ODT) 4 MG disintegrating tablet Take 1 tablet (4 mg total) by mouth every 8 (eight) hours as needed for nausea or vomiting.  . vitamin B-12 (CYANOCOBALAMIN) 1000 MCG tablet Take 1,000 mcg by mouth daily.  . zoledronic acid (RECLAST) 5 MG/100ML SOLN injection Inject 5 mg into the vein once. Yearly  . [DISCONTINUED] calcium carbonate (OSCAL) 1500 (600 Ca) MG TABS tablet Take by mouth 2 (two) times daily with a meal.  . [DISCONTINUED] Multiple Vitamin  (MULTI-VITAMIN DAILY PO) Take by mouth. Alive multi vit   No facility-administered encounter medications on file as of 01/17/2020.   ALLERGIES: Allergies  Allergen Reactions  . Compazine Other (See Comments)    Slurred speech  . Statins Other (See Comments)    myalgias  . Sulfa Drugs Cross Reactors Rash    VACCINATION STATUS: Immunization History  Administered Date(s) Administered  . Fluad Quad(high Dose 65+) 12/19/2019  . Influenza Split 01/25/2013  .  Influenza, High Dose Seasonal PF 01/20/2019  . Influenza,inj,Quad PF,6+ Mos 12/29/2013, 01/15/2015, 01/07/2016, 12/23/2016, 01/15/2018  . Influenza-Unspecified 12/30/2011  . Moderna SARS-COVID-2 Vaccination 05/26/2019, 06/24/2019  . Pneumococcal Conjugate-13 01/12/2014  . Pneumococcal Polysaccharide-23 12/30/2011, 04/20/2018  . Zoster 04/17/2011     HPI   Grace [name] is 74 y.o. female who presents today with a medical history as above. she is being seen in consultation for osteoporosis requested by Kathyrn Drown, MD.  Patient was diagnosed with osteoporosis  approximately  7 years ago. She denies fractures or falls. No dizziness/vertigo/orthostasis.  I reviewed pt's DEXA scans: From 2014-20 21, 4 different imaging studies.  She has been on the following OP treatments: Reclast IV for 3-4 years.  No h/o vitamin D deficiency, is on vitamin D supplement with cholecalciferol 1000 units daily. -No reported height loss.  Pt is on calcium, most recent calcium is still suboptimal at 8.3.  she also eats dairy and green, leafy, vegetables.   She stays active, walking, dancing, no weight bearing exercises.  No h/o hyper/hypocalcemia. No h/o hyperparathyroidism. No h/o kidney stones.  No h/o thyrotoxicosis.   No h/o CKD. Last BUN/Cr: Lab Results  Component Value Date   BUN 10 12/08/2019   CREATININE 0.80 12/08/2019    Menopause in her late 53s.   Pt does not have a FH of osteoporosis.  I reviewed her chart and she also  has a history of hypothyroidism on levothyroxine 75 mcg p.o. daily before breakfast..   Review of Systems  Constitutional: no weight gain/loss, no fatigue, no subjective hyperthermia, no subjective hypothermia Eyes: no blurry vision, no xerophthalmia ENT: no sore throat, no nodules palpated in throat, no dysphagia/odynophagia, no hoarseness Cardiovascular: no Chest Pain, no Shortness of Breath, no palpitations, no leg swelling Respiratory: no cough, no SOB Gastrointestinal: no Nausea/Vomiting/Diarhhea Musculoskeletal: no muscle/joint aches Skin: no rashes Neurological: no tremors, no numbness, no tingling, no dizziness Psychiatric: no depression, no anxiety  Objective:    BP (!) 122/56   Pulse (!) 56   Ht 5\' 2"  (1.575 m)   Wt 125 lb 12.8 oz (57.1 kg)   BMI 23.01 kg/m   Wt Readings from Last 3 Encounters:  01/17/20 125 lb 12.8 oz (57.1 kg)  12/19/19 124 lb (56.2 kg)  12/12/19 123 lb 6.4 oz (56 kg)    Physical Exam  Constitutional: + BMI of 23, not in acute distress, normal state of mind Eyes: PERRLA, EOMI, no exophthalmos ENT: moist mucous membranes, no thyromegaly, no cervical lymphadenopathy Cardiovascular: normal precordial activity, Regular Rate and Rhythm, no Murmur/Rubs/Gallops Respiratory:  adequate breathing efforts, no gross chest deformity, Clear to auscultation bilaterally Gastrointestinal: abdomen soft, Non -tender, No distension, Bowel Sounds present Musculoskeletal: no gross deformities, strength intact in all four extremities Skin: moist, warm, no rashes Neurological: no tremor with outstretched hands, Deep tendon reflexes normal in all four extremities.  CMP ( most recent) CMP     Component Value Date/Time   NA 136 12/08/2019 0926   NA 139 11/17/2018 1547   K 4.4 12/08/2019 0926   CL 95 (L) 12/08/2019 0926   CO2 27 08/24/2019 1604   GLUCOSE 78 12/08/2019 0926   BUN 10 12/08/2019 0926   BUN 10 11/17/2018 1547   CREATININE 0.80 12/08/2019 0926    CREATININE 0.68 01/20/2019 0946   CALCIUM 8.3 (L) 08/24/2019 1604   PROT 7.2 12/12/2019 0000   PROT 7.3 11/17/2018 1547   ALBUMIN 4.5 11/17/2018 1547   AST 25 12/12/2019 0000  ALT 18 12/12/2019 0000   ALKPHOS 61 11/17/2018 1547   BILITOT 0.5 12/12/2019 0000   BILITOT 0.5 11/17/2018 1547   GFRNONAA >60 08/24/2019 1604   GFRNONAA 87 01/20/2019 0946   GFRAA >60 08/24/2019 1604   GFRAA 101 01/20/2019 0946     Diabetic Labs (most recent): Lab Results  Component Value Date   HGBA1C 5.0 02/10/2017   HGBA1C 5.1 02/20/2012     Lipid Panel ( most recent) Lipid Panel     Component Value Date/Time   CHOL 137 08/24/2019 1604   CHOL 189 04/20/2018 0951   TRIG 177 (H) 08/24/2019 1604   HDL 35 (L) 08/24/2019 1604   HDL 50 04/20/2018 0951   CHOLHDL 3.9 08/24/2019 1604   VLDL 35 08/24/2019 1604   LDLCALC 67 08/24/2019 1604   LDLCALC 120 (H) 04/20/2018 0951   LABVLDL 19 04/20/2018 0951      Lab Results  Component Value Date   TSH 3.35 12/12/2019   TSH 1.890 11/17/2018   TSH 2.070 12/04/2017   TSH 2.192 02/09/2017   TSH 1.780 12/23/2016   TSH 3.690 08/07/2016   TSH 2.600 10/09/2015   TSH 1.380 05/01/2015   TSH 3.840 08/15/2014   TSH 0.884 04/03/2014   FREET4 1.30 11/17/2018   FREET4 1.25 08/07/2016   FREET4 1.26 05/05/2013   FREET4 1.12 07/05/2012     Reviewed her labs for DEXA reports.  AP Spine L1-L4 (L2,L3) 12/16/2019 72.9 Osteopenia -2.4 0.881 g/cm2 1.6% - [facility] Spine L1-L4 (L2,L3) 12/09/2017 70.9 Osteoporosis -2.5 0.867 g/cm2 -4.8% - AP Spine L1-L4 (L2,L3) 03/21/2015 68.1 Osteopenia -2.1 0.911 g/cm2 -2.8% - AP Spine L1-L4 (L2,L3) 11/23/2012 65.8 Osteopenia -1.9 0.937 g/cm2 - -  DualFemur Neck Right 12/16/2019 72.9 Osteoporosis -2.8 0.652 g/cm2 -2.4% - DualFemur Neck Right 12/09/2017 70.9 Osteoporosis -2.7 0.668 g/cm2 -8.7% Yes DualFemur Neck Right 03/21/2015 68.1 Osteopenia -2.2 0.732 g/cm2 6.6% Yes DualFemur Neck Right 11/23/2012 65.8 Osteoporosis -2.5  0.687 g/cm2 - -  DualFemur Total Mean 12/16/2019 72.9 Osteoporosis -2.6 0.681 g/cm2 -3.0% - DualFemur Total Mean 12/09/2017 70.9 Osteopenia -2.4 0.702 g/cm2 -3.4% Yes DualFemur Total Mean 03/21/2015 68.1 Osteopenia -2.2 0.727 g/cm2 2.0% - DualFemur Total Mean 11/23/2012 65.8 Osteopenia -2.3 0.713 g/cm2 - -  Left Forearm Radius 33% 12/16/2019 72.9 Osteopenia -1.9 0.579 g/cm2  Assessment: 1. Osteoporosis  Plan: 1. Osteoporosis - likely postmenopausal , on stable treatment with IV Reclast with clinical response. -I reviewed her available DEXA reports, lab works.    I discussed about increased risk of fracture, depending on the T score, greatly increased when the T score is lower than -2.5. We reviewed her DEXA scans together  - We discussed about the different medication classes, benefits and side effects -she reports some undiagnosed GI complaints.  She is not a candidate for oral bisphosphonates. -I believe she is being adequately treated with IV Reclast and will be continued on the same.   -There is a 6-year safety data for Reclast and reportedly she is on year 3-4 treatment . She is due in September 2022.  She will be seen in August 2022 with repeat CMP, PTH/calcium.   Her next DEXA is in September 2023.  If response is inadequate, she would be considered for Prolia.   - we reviewed her dietary and supplemental calcium and vitamin D intake, I discussed and increase her calcium to calcium carbonate 1250 mg p.o. twice daily.  This will give her 1000 mg of elemental calcium daily.    -I discussed fall  precautions .   - I advised patient to maintain close follow up with Kathyrn Drown, MD for primary care needs.  - Time spent with the patient: 45 minutes, of which >50% was spent in obtaining information about her symptoms, reviewing her previous labs, evaluations, and treatments, counseling her about her osteoporosis, and developing a plan to confirm the diagnosis and long term  treatment as necessary.  [name] participated in the discussions, expressed understanding, and voiced agreement with the above plans.  All questions were answered to her satisfaction. she is encouraged to contact clinic should she have any questions or concerns prior to her return visit.  Follow up plan: Return in about 10 months (around 11/28/2020) for F/U with Pre-visit Labs.   Glade Lloyd, MD [facility] [address] [place], [place] [address] Phone: [phone]  Fax: [phone]     01/17/2020, 5:38 PM  This note was partially dictated with voice recognition software. Similar sounding words can be transcribed inadequately or may not  be corrected upon review.

## 2020-01-30 ENCOUNTER — Encounter (INDEPENDENT_AMBULATORY_CARE_PROVIDER_SITE_OTHER): Payer: Self-pay | Admitting: Gastroenterology

## 2020-01-30 ENCOUNTER — Encounter (INDEPENDENT_AMBULATORY_CARE_PROVIDER_SITE_OTHER): Payer: Self-pay | Admitting: *Deleted

## 2020-01-30 ENCOUNTER — Other Ambulatory Visit: Payer: Self-pay

## 2020-01-30 ENCOUNTER — Ambulatory Visit (INDEPENDENT_AMBULATORY_CARE_PROVIDER_SITE_OTHER): Payer: Medicare HMO | Admitting: Gastroenterology

## 2020-01-30 ENCOUNTER — Other Ambulatory Visit (INDEPENDENT_AMBULATORY_CARE_PROVIDER_SITE_OTHER): Payer: Self-pay | Admitting: *Deleted

## 2020-01-30 VITALS — BP 143/66 | HR 72 | Temp 99.1°F | Ht 62.0 in | Wt 125.6 lb

## 2020-01-30 DIAGNOSIS — R1310 Dysphagia, unspecified: Secondary | ICD-10-CM | POA: Diagnosis not present

## 2020-01-30 DIAGNOSIS — K3184 Gastroparesis: Secondary | ICD-10-CM | POA: Diagnosis not present

## 2020-01-30 DIAGNOSIS — R6881 Early satiety: Secondary | ICD-10-CM | POA: Diagnosis not present

## 2020-01-30 DIAGNOSIS — R110 Nausea: Secondary | ICD-10-CM | POA: Diagnosis not present

## 2020-01-30 NOTE — Progress Notes (Signed)
Patient profile: Grace [name] is a 74 y.o. female seen for follow-up.  She was last seen in clinic September 2021 for nausea, early satiety, and abdominal pain.  She had a gastric emptying scan that demonstrated gastroparesis.  She is seen today for follow-up  History of Present Illness: Grace [name] is seen today for follow up. She reports having continued symptoms despite trial of Reglan 5 mg 4 times a day. Feels reglan only helped symptoms mildly. Still having symptoms daily.  Endorses significant early satiety as well as some epigastric pain and nausea postprandially. She denies a lot of reflux symptoms. Does have some dysphagia to meats and breads in upper/mid esophageal area.  No liquid or pill dysphagia.  Bowels can vary from hard to loose stools. If takes miralax some days will give diarreha and other days feels like doesn't helps. Some days stools are daily or every 2 days. No blood in stool. Does have some cramping in lower abd cramping but this is fairly mild.  Also endorses a lot of urinary frequency.  Weight stable over the past month but endorses weight loss over past few years.  She denies NSAID use, only uses Tylenol.  Non-smoker.  No frequent alcohol.  Wt Readings from Last 3 Encounters:  01/30/20 125 lb 9.6 oz (57 kg)  01/17/20 125 lb 12.8 oz (57.1 kg)  12/19/19 124 lb (56.2 kg)  Jan 2020 weight - #132  Last Colonoscopy: 02/2016-- Three small polyps in the transverse colon, in the ascending colon and in the cecum. Biopsied. - One 4 mm polyp in the sigmoid colon, removed with a cold snare. Resected and Retrieved.  She had 4 small polyps removed. 2 are tubular adenomas.. Next colonoscopy in 5 years.   Last Endoscopy: 12/2017-- Normal esophagus. - Z-line irregular, 35 cm from the incisors. - No endoscopic esophageal abnormality to explain patient's dysphagia. Esophagus dilated. Dilated. - A few gastric polyps. These polyps appeared to be hyperplastic and were left  alone. - Normal duodenal bulb and second portion of the duodenum. - No specimens collected.    Past Medical History:  Past Medical History:  Diagnosis Date  . Cancer (HCC)    Skin Cancer, Squamous Cell  . Diastolic dysfunction 28/41/3244   Ejection fraction 75%.  . Dry eyes, bilateral   . Family history of anesthesia complication    Mother was hard to wake up after anesthesia  . Frequency of urination   . Heart murmur   . Hyperlipidemia    Diet controlled  . Hypothyroid   . Migraine 2013  . Mitral valve prolapse   . Stroke [facility])    3 episodes in past yr  . TIA (transient ischemic attack) 02/19/2012    Problem List: Patient Active Problem List   Diagnosis Date Noted  . Statin myopathy 03/10/2019  . Myalgia 03/10/2019  . Heme + stool 01/20/2019  . Esophageal dysphagia 12/22/2017  . Exertional angina ([place]) 07/17/2017  . History of colonic polyps 11/26/2015  . Genital herpes simplex type 2 05/14/2015  . Hyperlipemia 01/15/2015  . Pernicious anemia 11/04/2013  . Fatigue 09/19/2013  . Early satiety 07/27/2013  . Dysphagia, unspecified(787.20) 07/27/2013  . Loss of weight 07/18/2013  . Osteoporosis 01/25/2013  . GERD (gastroesophageal reflux disease) 11/16/2012  . Irritable bowel syndrome 11/16/2012  . Sinus bradycardia 02/20/2012  . Diastolic dysfunction 04/02/7251  . TIA (transient ischemic attack) 02/19/2012  . Heart murmur 02/19/2012  . Hypothyroidism 11/28/2010  . Hypercholesterolemia 11/28/2010  Past Surgical History: Past Surgical History:  Procedure Laterality Date  . BALLOON DILATION N/A 07/28/2013   Procedure: BALLOON DILATION;  Surgeon: Rogene Houston, MD;  [facility];  Service: Endoscopy;  Laterality: N/A;  . BREAST BIOPSY     left benign tumor  . CATARACT EXTRACTION W/PHACO Right 07/11/2013   Procedure: CATARACT EXTRACTION PHACO AND INTRAOCULAR LENS PLACEMENT (IOC);  Surgeon: Tonny Branch, MD;  Location: [facility];  Service: Ophthalmology;   Laterality: Right;  CDE 9.99  . CATARACT EXTRACTION W/PHACO Left 08/04/2013   Procedure: CATARACT EXTRACTION PHACO AND INTRAOCULAR LENS PLACEMENT (IOC);  Surgeon: Tonny Branch, MD;  Location: [facility];  Service: Ophthalmology;  Laterality: Left;  CDE:16.26  . CHOLECYSTECTOMY    . COLONOSCOPY  12/18/2010   Procedure: COLONOSCOPY;  Surgeon: Rogene Houston, MD;  [facility];  Service: Endoscopy;  Laterality: N/A;  1:00 pm  . COLONOSCOPY N/A 03/06/2016   Procedure: COLONOSCOPY;  Surgeon: Rogene Houston, MD;  [facility];  Service: Endoscopy;  Laterality: N/A;  1200  . ESOPHAGEAL DILATION N/A 01/27/2018   Procedure: ESOPHAGEAL DILATION;  Surgeon: Rogene Houston, MD;  [facility];  Service: Endoscopy;  Laterality: N/A;  . ESOPHAGOGASTRODUODENOSCOPY    . ESOPHAGOGASTRODUODENOSCOPY N/A 07/28/2013   Procedure: ESOPHAGOGASTRODUODENOSCOPY (EGD);  Surgeon: Rogene Houston, MD;  [facility];  Service: Endoscopy;  Laterality: N/A;  125  . ESOPHAGOGASTRODUODENOSCOPY N/A 01/27/2018   Procedure: ESOPHAGOGASTRODUODENOSCOPY (EGD);  Surgeon: Rogene Houston, MD;  [facility];  Service: Endoscopy;  Laterality: N/A;  2:00-moved to 10/30 @ 2:45pm per Lelon Frohlich  . LEFT HEART CATH AND CORONARY ANGIOGRAPHY N/A 07/17/2017   Procedure: LEFT HEART CATH AND CORONARY ANGIOGRAPHY;  Surgeon: Sherren Mocha, MD;  Location: [place] [facility];  Service: Cardiovascular;  Laterality: N/A;  . MALONEY DILATION N/A 07/28/2013   Procedure: Venia Minks DILATION;  Surgeon: Rogene Houston, MD;  [facility];  Service: Endoscopy;  Laterality: N/A;  . SAVORY DILATION N/A 07/28/2013   Procedure: SAVORY DILATION;  Surgeon: Rogene Houston, MD;  [facility];  Service: Endoscopy;  Laterality: N/A;  . THYROIDECTOMY  11/20/2011   Procedure: THYROIDECTOMY;  Surgeon: Izora Gala, MD;  Location: [place];  Service: ENT;  Laterality: Left;  LEFT THYROID LOBECTOMY  . TUBAL LIGATION      39 yrs ago.    Allergies: Allergies  Allergen Reactions  . Compazine Other (See Comments)    Slurred speech  . Statins Other (See Comments)    myalgias  . Sulfa Drugs Cross Reactors Rash      Home Medications:  Current Outpatient Medications:  .  cholecalciferol (VITAMIN D) 1000 units tablet, Take 1,000 Units by mouth daily at 12 noon., Disp: , Rfl:  .  Cholecalciferol (VITAMIN D3 PO), Take by mouth daily., Disp: , Rfl:  .  ezetimibe (ZETIA) 10 MG tablet, Take 1 tablet (10 mg total) by mouth daily., Disp: 90 tablet, Rfl: 1 .  levothyroxine (SYNTHROID) 75 MCG tablet, TAKE 1 TABLET TUESDAY TO   SUNDAY AND 1/2 TABLET ON   MONDAY, Disp: 88 tablet, Rfl: 3 .  metoCLOPramide (REGLAN) 5 MG tablet, Take 1 tablet (5 mg total) by mouth 4 (four) times daily -  before meals and at bedtime., Disp: 120 tablet, Rfl: 1 .  Multiple Vitamins-Minerals (ALIVE WOMENS 50+ PO), Take 1 tablet by mouth daily., Disp: , Rfl:  .  ondansetron (ZOFRAN ODT) 4 MG disintegrating  tablet, Take 1 tablet (4 mg total) by mouth every 8 (eight) hours as needed for nausea or vomiting., Disp: 24 tablet, Rfl: 0 .  vitamin B-12 (CYANOCOBALAMIN) 1000 MCG tablet, Take 1,000 mcg by mouth daily., Disp: , Rfl:  .  zoledronic acid (RECLAST) 5 MG/100ML SOLN injection, Inject 5 mg into the vein once. Yearly, Disp: , Rfl:  .  calcium carbonate (OS-CAL - DOSED IN MG OF ELEMENTAL CALCIUM) 1250 (500 Ca) MG tablet, Take 1 tablet (500 mg of elemental calcium total) by mouth 2 (two) times daily with a meal. (Patient not taking: Reported on 01/30/2020), Disp: 180 tablet, Rfl: 3   Family History: family history includes Cancer in her paternal grandmother; Heart disease in her mother; Hypertension in her mother; Stroke in her maternal grandmother and mother; Thyroid disease in her mother.    Social History:   reports that she has never smoked. She has never used smokeless tobacco. She reports that she does not drink alcohol and does not use drugs.     Review of Systems: Constitutional: Denies weight loss/weight gain  Eyes: No changes in vision. ENT: No oral lesions, sore throat.  GI: see HPI.  Heme/Lymph: No easy bruising.  CV: No chest pain.  GU: No hematuria.  Integumentary: No rashes.  Neuro: No headaches.  Psych: No depression/anxiety.  Endocrine: No heat/cold intolerance.  Allergic/Immunologic: No urticaria.  Resp: No cough, SOB.  Musculoskeletal: No joint swelling.    Physical Examination: BP (!) 143/66 (BP Location: Right Arm, Patient Position: Sitting, Cuff Size: Normal)   Pulse 72   Temp 99.1 F (37.3 C) (Oral)   Ht 5\' 2"  (1.575 m)   Wt 125 lb 9.6 oz (57 kg)   BMI 22.97 kg/m  Gen: NAD, alert and oriented x 4 HEENT: PEERLA, EOMI, Neck: supple, no JVD Chest: CTA bilaterally, no wheezes, crackles, or other adventitious sounds CV: RRR, no m/g/c/r Abd: soft, NT, ND, +BS in all four quadrants; no HSM, guarding, ridigity, or rebound tenderness Ext: no edema, well perfused with 2+ pulses, Skin: no rash or lesions noted on observed skin Lymph: no noted LAD  Data Reviewed:   GES 11/2019  12% emptied at 1 hr ( normal >= 10%) 17% emptied at 2 hr ( normal >= 40%) 21% emptied at 3 hr ( normal >= 70%) 25% emptied at 4 hr ( normal >= 90%) IMPRESSION: Significantly delayed gastric emptying study.    Assessment/Plan: Ms. Holleman is a 74 y.o. female seen for follow-up of nausea, early satiety  1.  Gastroparesis-significantly delayed gastric emptying on gastric emptying scan September 2021.  She has had minimal improvement with Reglan.  Discussed with Dr. Laural Golden and will plan to schedule an endoscopy to exclude anatomic outlet issues.  She denies any prior issues with sedation.  She has had a minimal response with Reglan and will continue 5mg  QID but could consider dose increase after EGD if negative.  No side effects of Reglan noted.  2.  Dysphagia-mild symptoms to solids and can consider dilation if needed at time of  upper endoscopy. She reports a hx of dysphagia responding well to dilation in past.  3.  History of colon polyps-due for colonoscopy next December  4.  Alternating bowel habits-likely has mild irritable bowel. Can try fiber and probiotics. UTD on colonoscopy.     Maddeline was seen today for follow-up.  Diagnoses and all orders for this visit:  Early satiety  Gastroparesis  Nausea without vomiting  Dysphagia, unspecified type  I personally performed the service, non-incident to. (WP)  Laurine Blazer, [facility]

## 2020-01-30 NOTE — Patient Instructions (Signed)
We are scheduling endoscopy for evaluation.  In interim we will plan soft diet as we discussed. Gastroparesis  Gastroparesis is a condition in which food takes longer than normal to empty from the stomach. The condition is usually long-lasting (chronic). It may also be called delayed gastric emptying. There is no cure, but there are treatments and things that you can do at home to help relieve symptoms. Treating the underlying condition that causes gastroparesis can also help relieve symptoms. What are the causes? In many cases, the cause of this condition is not known. Possible causes include:  A hormone (endocrine) disorder, such as hypothyroidism or diabetes.  A nervous system disease, such as Parkinson's disease or multiple sclerosis.  Cancer, infection, or surgery that affects the stomach or vagus nerve. The vagus nerve runs from your chest, through your neck, to the lower part of your brain.  A connective tissue disorder, such as scleroderma.  Certain medicines. What increases the risk? You are more likely to develop this condition if you:  Have certain disorders or diseases, including: ? An endocrine disorder. ? An eating disorder. ? Amyloidosis. ? Scleroderma. ? Parkinson's disease. ? Multiple sclerosis. ? Cancer or infection of the stomach or the vagus nerve.  Have had surgery on the stomach or vagus nerve.  Take certain medicines.  Are female. What are the signs or symptoms? Symptoms of this condition include:  Feeling full after eating very little.  Nausea.  Vomiting.  Heartburn.  Abdominal bloating.  Inconsistent blood sugar (glucose) levels on blood tests.  Lack of appetite.  Weight loss.  Acid from the stomach coming up into the esophagus (gastroesophageal reflux).  Sudden tightening (spasm) of the stomach, which can be painful. Symptoms may come and go. Some people may not notice any symptoms. How is this diagnosed? This condition is diagnosed  with tests, such as:  Tests that check how long it takes food to move through the stomach and intestines. These tests include: ? Upper gastrointestinal (GI) series. For this test, you drink a liquid that shows up well on X-rays, and then X-rays will be taken of your intestines. ? Gastric emptying scintigraphy. For this test, you eat food that contains a small amount of radioactive material, and then scans are taken. ? Wireless capsule GI monitoring system. For this test, you swallow a pill (capsule) that records information about how foods and fluid move through your stomach.  Gastric manometry. For this test, a tube is passed down your throat and into your stomach to measure electrical and muscular activity.  Endoscopy. For this test, a long, thin tube is passed down your throat and into your stomach to check for problems in your stomach lining.  Ultrasound. This test uses sound waves to create images of inside the body. This can help rule out gallbladder disease or pancreatitis as a cause of your symptoms. How is this treated? There is no cure for gastroparesis. Treatment may include:  Treating the underlying cause.  Managing your symptoms by making changes to your diet and exercise habits.  Taking medicines to control nausea and vomiting and to stimulate stomach muscles.  Getting food through a feeding tube in the hospital. This may be done in severe cases.  Having surgery to insert a device into your body that helps improve stomach emptying and control nausea and vomiting (gastric neurostimulator). Follow these instructions at home:  Take over-the-counter and prescription medicines only as told by your health care provider.  Follow instructions from your health  care provider about eating or drinking restrictions. Your health care provider may recommend that you: ? Eat smaller meals more often. ? Eat low-fat foods. ? Eat low-fiber forms of high-fiber foods. For example, eat cooked  vegetables instead of raw vegetables. ? Have only liquid foods instead of solid foods. Liquid foods are easier to digest.  Drink enough fluid to keep your urine pale yellow.  Exercise as often as told by your health care provider.  Keep all follow-up visits as told by your health care provider. This is important. Contact a health care provider if you:  Notice that your symptoms do not improve with treatment.  Have new symptoms. Get help right away if you:  Have severe abdominal pain that does not improve with treatment.  Have nausea that is severe or does not go away.  Cannot drink fluids without vomiting. Summary  Gastroparesis is a chronic condition in which food takes longer than normal to empty from the stomach.  Symptoms include nausea, vomiting, heartburn, abdominal bloating, and loss of appetite.  Eating smaller portions, and low-fat, low-fiber foods may help you manage your symptoms.  Get help right away if you have severe abdominal pain. This information is not intended to replace advice given to you by your health care provider. Make sure you discuss any questions you have with your health care provider. Document Revised: 06/15/2017 Document Reviewed: 01/20/2017 Elsevier Patient Education  2020 [employer].

## 2020-02-14 ENCOUNTER — Other Ambulatory Visit: Payer: Self-pay

## 2020-02-14 ENCOUNTER — Other Ambulatory Visit (HOSPITAL_COMMUNITY)
Admission: RE | Admit: 2020-02-14 | Discharge: 2020-02-14 | Disposition: A | Discharge status: Home / Self Care | Payer: Medicare HMO | Source: Ambulatory Visit | Attending: Internal Medicine | Admitting: Internal Medicine

## 2020-02-14 DIAGNOSIS — Z01812 Encounter for preprocedural laboratory examination: Secondary | ICD-10-CM | POA: Diagnosis present

## 2020-02-14 DIAGNOSIS — Z20822 Contact with and (suspected) exposure to covid-19: Secondary | ICD-10-CM | POA: Insufficient documentation

## 2020-02-15 ENCOUNTER — Encounter (HOSPITAL_COMMUNITY): Payer: Self-pay | Admitting: Anesthesiology

## 2020-02-15 LAB — SARS CORONAVIRUS 2 (TAT 6-24 HRS): SARS Coronavirus 2: NEGATIVE

## 2020-02-16 ENCOUNTER — Encounter (HOSPITAL_COMMUNITY)
Admission: RE | Disposition: A | Discharge status: Home / Self Care | Payer: Self-pay | Source: Home / Self Care | Attending: Internal Medicine

## 2020-02-16 ENCOUNTER — Other Ambulatory Visit: Payer: Self-pay

## 2020-02-16 ENCOUNTER — Ambulatory Visit (HOSPITAL_COMMUNITY)
Admission: RE | Admit: 2020-02-16 | Discharge: 2020-02-16 | Disposition: A | Discharge status: Home / Self Care | Payer: Medicare HMO | Attending: Internal Medicine | Admitting: Internal Medicine

## 2020-02-16 ENCOUNTER — Encounter (HOSPITAL_COMMUNITY): Payer: Self-pay | Admitting: Internal Medicine

## 2020-02-16 DIAGNOSIS — R933 Abnormal findings on diagnostic imaging of other parts of digestive tract: Secondary | ICD-10-CM | POA: Insufficient documentation

## 2020-02-16 DIAGNOSIS — K2950 Unspecified chronic gastritis without bleeding: Secondary | ICD-10-CM | POA: Diagnosis not present

## 2020-02-16 DIAGNOSIS — R634 Abnormal weight loss: Secondary | ICD-10-CM | POA: Insufficient documentation

## 2020-02-16 DIAGNOSIS — K3184 Gastroparesis: Secondary | ICD-10-CM

## 2020-02-16 DIAGNOSIS — Z882 Allergy status to sulfonamides status: Secondary | ICD-10-CM | POA: Diagnosis not present

## 2020-02-16 DIAGNOSIS — Z8673 Personal history of transient ischemic attack (TIA), and cerebral infarction without residual deficits: Secondary | ICD-10-CM | POA: Insufficient documentation

## 2020-02-16 DIAGNOSIS — R110 Nausea: Secondary | ICD-10-CM

## 2020-02-16 DIAGNOSIS — K317 Polyp of stomach and duodenum: Secondary | ICD-10-CM | POA: Diagnosis not present

## 2020-02-16 DIAGNOSIS — R1314 Dysphagia, pharyngoesophageal phase: Secondary | ICD-10-CM | POA: Diagnosis present

## 2020-02-16 DIAGNOSIS — Z79899 Other long term (current) drug therapy: Secondary | ICD-10-CM | POA: Insufficient documentation

## 2020-02-16 DIAGNOSIS — Z888 Allergy status to other drugs, medicaments and biological substances status: Secondary | ICD-10-CM | POA: Insufficient documentation

## 2020-02-16 DIAGNOSIS — R1310 Dysphagia, unspecified: Secondary | ICD-10-CM

## 2020-02-16 DIAGNOSIS — K2289 Other specified disease of esophagus: Secondary | ICD-10-CM | POA: Insufficient documentation

## 2020-02-16 DIAGNOSIS — K31A12 Gastric intestinal metaplasia without dysplasia, involving the body (corpus): Secondary | ICD-10-CM | POA: Diagnosis not present

## 2020-02-16 DIAGNOSIS — K2970 Gastritis, unspecified, without bleeding: Secondary | ICD-10-CM | POA: Diagnosis not present

## 2020-02-16 DIAGNOSIS — Z79890 Hormone replacement therapy (postmenopausal): Secondary | ICD-10-CM | POA: Diagnosis not present

## 2020-02-16 DIAGNOSIS — R6881 Early satiety: Secondary | ICD-10-CM

## 2020-02-16 HISTORY — PX: ESOPHAGOGASTRODUODENOSCOPY: SHX5428

## 2020-02-16 HISTORY — PX: ESOPHAGEAL DILATION: SHX303

## 2020-02-16 HISTORY — PX: BIOPSY: SHX5522

## 2020-02-16 SURGERY — EGD (ESOPHAGOGASTRODUODENOSCOPY)
Anesthesia: Moderate Sedation

## 2020-02-16 MED ORDER — GLYCOPYRROLATE 0.2 MG/ML IJ SOLN: 0.1000 mg | Freq: Once | INTRAMUSCULAR | Status: DC

## 2020-02-16 MED ORDER — MEPERIDINE HCL 50 MG/ML IJ SOLN
INTRAMUSCULAR | Status: DC | PRN
  Administered 2020-02-16 (×2): 25 mg via INTRAVENOUS

## 2020-02-16 MED ORDER — LIDOCAINE VISCOUS HCL 2 % MT SOLN
OROMUCOSAL | Status: AC
  Filled 2020-02-16: qty 15

## 2020-02-16 MED ORDER — MIDAZOLAM HCL 5 MG/5ML IJ SOLN
INTRAMUSCULAR | Status: DC | PRN
  Administered 2020-02-16: 1 mg via INTRAVENOUS
  Administered 2020-02-16 (×3): 2 mg via INTRAVENOUS

## 2020-02-16 MED ORDER — LIDOCAINE VISCOUS HCL 2 % MT SOLN
OROMUCOSAL | Status: DC | PRN
  Administered 2020-02-16: 5 mL via OROMUCOSAL

## 2020-02-16 MED ORDER — METOCLOPRAMIDE HCL 10 MG PO TABS
10.0000 mg | ORAL_TABLET | Freq: Three times a day (TID) | ORAL | 0 refills | Status: DC
Start: 2020-02-16 — End: 2020-10-18

## 2020-02-16 MED ORDER — LACTATED RINGERS IV SOLN: Freq: Once | INTRAVENOUS | Status: DC

## 2020-02-16 MED ORDER — MIDAZOLAM HCL 5 MG/5ML IJ SOLN
INTRAMUSCULAR | Status: AC
  Filled 2020-02-16: qty 10

## 2020-02-16 MED ORDER — SODIUM CHLORIDE 0.9 % IV SOLN: INTRAVENOUS | Status: DC

## 2020-02-16 MED ORDER — STERILE WATER FOR IRRIGATION IR SOLN
Status: DC | PRN
  Administered 2020-02-16: 1.5 mL

## 2020-02-16 MED ORDER — MEPERIDINE HCL 50 MG/ML IJ SOLN
INTRAMUSCULAR | Status: AC
  Filled 2020-02-16: qty 1

## 2020-02-16 MED ORDER — LIDOCAINE VISCOUS HCL 2 % MT SOLN: 15.0000 mL | Freq: Once | OROMUCOSAL | Status: DC

## 2020-02-16 NOTE — H&P (Signed)
Grace [name] is an 74 y.o. female.   Chief Complaint: Patient is here for esophagogastroduodenoscopy with esophageal dilation. HPI: Patient is 74 year old Caucasian female who was recently evaluated for postprandial nausea early satiety and 5 to 7 pound weight loss.  She also has been complaining of dysphagia primarily to solids but sometimes liquid.  She says she has to wash her food down.  She points to suprasternal area site of bolus obstruction.  Last esophageal dilation was 2 years ago when she she did notice improvement for several months.  Gastric emptying study was obtained to assess ongoing symptoms.  Gastric emptying study was markedly delayed.  Only 25% of activity is noted in the small intestine.  She denies vomiting melena or rectal bleeding.  She has chronic constipation that she is trying to treat by taking polyethylene glycol.  Past Medical History:  Diagnosis Date  . Cancer (HCC)    Skin Cancer, Squamous Cell  . Diastolic dysfunction 94/70/9628   Ejection fraction 75%.  . Dry eyes, bilateral   . Family history of anesthesia complication    Mother was hard to wake up after anesthesia  . Frequency of urination   . Heart murmur   . Hyperlipidemia    Diet controlled  . Hypothyroid   . Migraine 2013  . Mitral valve prolapse   . Stroke [facility])    3 episodes in past yr  . TIA (transient ischemic attack) 02/19/2012    Past Surgical History:  Procedure Laterality Date  . BALLOON DILATION N/A 07/28/2013   Procedure: BALLOON DILATION;  Surgeon: Rogene Houston, MD;  [facility];  Service: Endoscopy;  Laterality: N/A;  . BREAST BIOPSY     left benign tumor  . CATARACT EXTRACTION W/PHACO Right 07/11/2013   Procedure: CATARACT EXTRACTION PHACO AND INTRAOCULAR LENS PLACEMENT (IOC);  Surgeon: Tonny Branch, MD;  [facility];  Service: Ophthalmology;  Laterality: Right;  CDE 9.99  . CATARACT EXTRACTION W/PHACO Left 08/04/2013   Procedure: CATARACT EXTRACTION PHACO AND  INTRAOCULAR LENS PLACEMENT (IOC);  Surgeon: Tonny Branch, MD;  [facility]: [facility];  Service: Ophthalmology;  Laterality: Left;  CDE:16.26  . CHOLECYSTECTOMY    . COLONOSCOPY  12/18/2010   Procedure: COLONOSCOPY;  Surgeon: Rogene Houston, MD;  [facility];  Service: Endoscopy;  Laterality: N/A;  1:00 pm  . COLONOSCOPY N/A 03/06/2016   Procedure: COLONOSCOPY;  Surgeon: Rogene Houston, MD;  [facility];  Service: Endoscopy;  Laterality: N/A;  1200  . ESOPHAGEAL DILATION N/A 01/27/2018   Procedure: ESOPHAGEAL DILATION;  Surgeon: Rogene Houston, MD;  [facility];  Service: Endoscopy;  Laterality: N/A;  . ESOPHAGOGASTRODUODENOSCOPY    . ESOPHAGOGASTRODUODENOSCOPY N/A 07/28/2013   Procedure: ESOPHAGOGASTRODUODENOSCOPY (EGD);  Surgeon: Rogene Houston, MD;  [facility];  Service: Endoscopy;  Laterality: N/A;  125  . ESOPHAGOGASTRODUODENOSCOPY N/A 01/27/2018   Procedure: ESOPHAGOGASTRODUODENOSCOPY (EGD);  Surgeon: Rogene Houston, MD;  [facility];  Service: Endoscopy;  Laterality: N/A;  2:00-moved to 10/30 @ 2:45pm per Lelon Frohlich  . LEFT HEART CATH AND CORONARY ANGIOGRAPHY N/A 07/17/2017   Procedure: LEFT HEART CATH AND CORONARY ANGIOGRAPHY;  Surgeon: Sherren Mocha, MD;  Location: [place] [facility];  Service: Cardiovascular;  Laterality: N/A;  . MALONEY DILATION N/A 07/28/2013   Procedure: Venia Minks DILATION;  Surgeon: Rogene Houston, MD;  [facility];  Service: Endoscopy;  Laterality: N/A;  . SAVORY DILATION N/A 07/28/2013  Procedure: SAVORY DILATION;  Surgeon: Rogene Houston, MD;  [facility];  Service: Endoscopy;  Laterality: N/A;  . THYROIDECTOMY  11/20/2011   Procedure: THYROIDECTOMY;  Surgeon: Izora Gala, MD;  Location: [place];  Service: ENT;  Laterality: Left;  LEFT THYROID LOBECTOMY  . TUBAL LIGATION     39 yrs ago.    Family History  Problem Relation Age of Onset  . Heart disease Mother   . Hypertension Mother    . Stroke Mother   . Thyroid disease Mother   . Stroke Maternal Grandmother   . Cancer Paternal Grandmother        breast  . Colon cancer Neg Hx    Social History:  reports that she has never smoked. She has never used smokeless tobacco. She reports that she does not drink alcohol and does not use drugs.  Allergies:  Allergies  Allergen Reactions  . Compazine Other (See Comments)    Slurred speech  . Statins Other (See Comments)    myalgias  . Sulfa Drugs Cross Reactors Rash    Medications Prior to Admission  Medication Sig Dispense Refill  . Calcium Carb-Cholecalciferol (CALCIUM+D3) 600-800 MG-UNIT TABS Take 1 tablet by mouth daily.    . Cholecalciferol (VITAMIN D3) 50 MCG (2000 UT) capsule Take 2,000 Units by mouth daily.     Marland Kitchen ezetimibe (ZETIA) 10 MG tablet Take 1 tablet (10 mg total) by mouth daily. 90 tablet 1  . levothyroxine (SYNTHROID) 75 MCG tablet TAKE 1 TABLET TUESDAY TO   SUNDAY AND 1/2 TABLET ON   MONDAY (Patient taking differently: Take 37.5-75 mcg by mouth See admin instructions. Take 75 mcg Tuesday -Sunday and 37.5 mcg on MONDAY) 88 tablet 3  . metoCLOPramide (REGLAN) 5 MG tablet Take 1 tablet (5 mg total) by mouth 4 (four) times daily -  before meals and at bedtime. 120 tablet 1  . Multiple Vitamins-Minerals (ALIVE WOMENS 50+ PO) Take 1 tablet by mouth daily.    . vitamin B-12 (CYANOCOBALAMIN) 1000 MCG tablet Take 1,000 mcg by mouth daily.    . zoledronic acid (RECLAST) 5 MG/100ML SOLN injection Inject 5 mg into the vein once. Yearly    . calcium carbonate (OS-CAL - DOSED IN MG OF ELEMENTAL CALCIUM) 1250 (500 Ca) MG tablet Take 1 tablet (500 mg of elemental calcium total) by mouth 2 (two) times daily with a meal. (Patient not taking: Reported on 01/30/2020) 180 tablet 3  . ondansetron (ZOFRAN ODT) 4 MG disintegrating tablet Take 1 tablet (4 mg total) by mouth every 8 (eight) hours as needed for nausea or vomiting. (Patient not taking: Reported on 02/13/2020) 24 tablet 0     Results for orders placed or performed during the hospital encounter of 02/14/20 (from the past 48 hour(s))  SARS CORONAVIRUS 2 (TAT 6-24 HRS) Nasopharyngeal Nasopharyngeal Swab     Status: None   Collection Time: 02/14/20  3:35 PM   Specimen: Nasopharyngeal Swab  Result Value Ref Range   SARS Coronavirus 2 NEGATIVE NEGATIVE    Comment: (NOTE) SARS-CoV-2 target nucleic acids are NOT DETECTED.  The SARS-CoV-2 RNA is generally detectable in upper and lower respiratory specimens during the acute phase of infection. Negative results do not preclude SARS-CoV-2 infection, do not rule out co-infections with other pathogens, and should not be used as the sole basis for treatment or other patient management decisions. Negative results must be combined with clinical observations, patient history, and epidemiological information. The expected result is Negative.  Fact  Sheet for Patients: SugarRoll.be  Fact Sheet for Healthcare Providers: https://www.woods-mathews.com/  This test is not yet approved or cleared by the [place] FDA and  has been authorized for detection and/or diagnosis of SARS-CoV-2 by FDA under an Emergency Use Authorization (EUA). This EUA will remain  in effect (meaning this test can be used) for the duration of the COVID-19 declaration under Se ction 564(b)(1) of the Act, 21 U.S.C. section 360bbb-3(b)(1), unless the authorization is terminated or revoked sooner.  Performed at [facility], [place] [address], [place], [place] [address]    No results found.  Review of Systems  Blood pressure 139/72, pulse 76, temperature 98.5 F (36.9 C), temperature source Oral, resp. rate 18, height 5\' 2"  (1.575 m), weight 56.2 kg, SpO2 100 %. Physical Exam HENT:     Mouth/Throat:     Mouth: Mucous membranes are moist.     Pharynx: Oropharynx is clear.  Eyes:     General: No scleral icterus.    Conjunctiva/sclera:  Conjunctivae normal.  Cardiovascular:     Rate and Rhythm: Normal rate and regular rhythm.     Heart sounds: Normal heart sounds. No murmur heard.   Pulmonary:     Effort: Pulmonary effort is normal.     Breath sounds: Normal breath sounds.  Abdominal:     General: There is no distension.     Palpations: Abdomen is soft. There is no mass.     Tenderness: There is no abdominal tenderness.  Musculoskeletal:        General: No swelling.     Cervical back: Neck supple.  Lymphadenopathy:     Cervical: No cervical adenopathy.  Skin:    General: Skin is warm and dry.  Neurological:     Mental Status: She is alert.      Assessment/Plan  Nausea early satiety and weight loss. Esophageal dysphagia. Abnormal gastric emptying study. Esophagogastroduodenoscopy with esophageal dilation.  Hildred Laser, MD 02/16/2020, 10:27 AM

## 2020-02-16 NOTE — Discharge Instructions (Signed)
Upper Endoscopy, Adult, Care After This sheet gives you information about how to care for yourself after your procedure. Your health care provider may also give you more specific instructions. If you have problems or questions, contact your health care provider. What can I expect after the procedure? After the procedure, it is common to have:  A sore throat.  Mild stomach pain or discomfort.  Bloating.  Nausea. Follow these instructions at home:   Follow instructions from your health care provider about what to eat or drink after your procedure.  Return to your normal activities as told by your health care provider. Ask your health care provider what activities are safe for you.  Take over-the-counter and prescription medicines only as told by your health care provider.  Do not drive for 24 hours if you were given a sedative during your procedure.  Keep all follow-up visits as told by your health care provider. This is important. Contact a health care provider if you have:  A sore throat that lasts longer than one day.  Trouble swallowing. Get help right away if:  You vomit blood or your vomit looks like coffee grounds.  You have: ? A fever. ? Bloody, black, or tarry stools. ? A severe sore throat or you cannot swallow. ? Difficulty breathing. ? Severe pain in your chest or abdomen. Summary  After the procedure, it is common to have a sore throat, mild stomach discomfort, bloating, and nausea.  Do not drive for 24 hours if you were given a sedative during the procedure.  Follow instructions from your health care provider about what to eat or drink after your procedure.  Return to your normal activities as told by your health care provider. This information is not intended to replace advice given to you by your health care provider. Make sure you discuss any questions you have with your health care provider. Document Revised: 09/08/2017 Document Reviewed:  08/17/2017 Elsevier Patient Education  [place]. No aspirin or NSAIDs for 24 hours. Metoclopramide 10 mg by mouth 30 minutes before breakfast daily.  If you experience tremors or any side effects stop the medicine and call office. Resume other medications and diet as before. No driving for 24 hours. Physician will call with biopsy results.

## 2020-02-16 NOTE — Op Note (Signed)
[facility] Patient Name: Grace [name] Procedure Date: 02/16/2020 10:09 AM MRN: [id number] Date of Birth: [birth year] Attending MD: Hildred Laser , MD CSN: [id number] Age: 74 Admit Type: Outpatient Procedure:                Upper GI endoscopy Indications:              Esophageal dysphagia, Abnormal PET scan of the GI                            tract Providers:                Hildred Laser, MD, Otis Peak B. Sharon Seller, RN, Lambert Mody, Randa Spike, Technician Referring MD:             Elayne Snare. Wolfgang Phoenix, MD Medicines:                Lidocaine jelly, Meperidine 50 mg IV, Midazolam 7                            mg IV Complications:            No immediate complications. Estimated Blood Loss:     Estimated blood loss was minimal. Procedure:                Pre-Anesthesia Assessment:                           - Prior to the procedure, a History and Physical                            was performed, and patient medications and                            allergies were reviewed. The patient's tolerance of                            previous anesthesia was also reviewed. The risks                            and benefits of the procedure and the sedation                            options and risks were discussed with the patient.                            All questions were answered, and informed consent                            was obtained. Prior Anticoagulants: The patient has                            taken no previous anticoagulant or antiplatelet  agents. ASA Grade Assessment: II - A patient with                            mild systemic disease. After reviewing the risks                            and benefits, the patient was deemed in                            satisfactory condition to undergo the procedure.                           After obtaining informed consent, the endoscope was                            passed  under direct vision. Throughout the                            procedure, the patient's blood pressure, pulse, and                            oxygen saturations were monitored continuously. The                            GIF-[id number] ([id number]) was introduced through the                            mouth, and advanced to the second part of duodenum.                            The upper GI endoscopy was accomplished without                            difficulty. The patient tolerated the procedure                            well. Scope In: 10:38:28 AM Scope Out: 10:51:41 AM Total Procedure Duration: 0 hours 13 minutes 13 seconds  Findings:      The hypopharynx was normal.      The examined esophagus was normal.      The Z-line was irregular and was found 36 cm from the incisors.      No endoscopic abnormality was evident in the esophagus to explain the       patient's complaint of dysphagia. It was decided, however, to proceed       with dilation of the entire esophagus. The scope was withdrawn. Dilation       was performed with a Maloney dilator with mild resistance at 26 Fr. The       dilation site was examined following endoscope reinsertion and showed no       change and no bleeding, mucosal tear or perforation.      A few small sessile polyps were found in the gastric body.      Diffuse mild inflammation characterized by congestion (edema), erythema       and friability was found  in the gastric body and in the gastric antrum.       Biopsies were taken with a cold forceps for histology. The pathology       specimen from gastric body was placed into Bottle Number 1. The       pathology specimen from gastric antrum was placed into Bottle Number 2.      The pylorus was normal.      The duodenal bulb and second portion of the duodenum were normal. Impression:               - Normal hypopharynx.                           - Normal esophagus.                           - Z-line irregular, 36 cm  from the incisors.                           - No endoscopic esophageal abnormality to explain                            patient's dysphagia. Esophagus dilated. Dilated.                           - A few gastric polyps.                           - Gastritis. Biopsied.                           - Normal pylorus.                           - Normal duodenal bulb and second portion of the                            duodenum. Moderate Sedation:      Moderate (conscious) sedation was administered by the endoscopy nurse       and supervised by the endoscopist. The following parameters were       monitored: oxygen saturation, heart rate, blood pressure, CO2       capnography and response to care. Total physician intraservice time was       19 minutes. Recommendation:           - Patient has a contact number available for                            emergencies. The signs and symptoms of potential                            delayed complications were discussed with the                            patient. Return to normal activities tomorrow.                            Written  discharge instructions were provided to the                            patient.                           - Resume previous diet today.                           - Continue present medications.                           - Increase Metoclopromide dose to 10 mg po ac.                           - No aspirin, ibuprofen, naproxen, or other                            non-steroidal anti-inflammatory drugs for 1 day.                           - Await pathology results. Procedure Code(s):        --- Professional ---                           [phone], Esophagogastroduodenoscopy, flexible,                            transoral; with biopsy, single or multiple                           43450, Dilation of esophagus, by unguided sound or                            bougie, single or multiple passes                           G0500, Moderate  sedation services provided by the                            same physician or other qualified health care                            professional performing a gastrointestinal                            endoscopic service that sedation supports,                            requiring the presence of an independent trained                            observer to assist in the monitoring of the                            patient's level of consciousness and physiological  status; initial 15 minutes of intra-service time;                            patient age 65 years or older (additional time may                            be reported with [phone], as appropriate) Diagnosis Code(s):        --- Professional ---                           K22.8, Other specified diseases of esophagus                           K31.7, Polyp of stomach and duodenum                           K29.70, Gastritis, unspecified, without bleeding                           R13.14, Dysphagia, pharyngoesophageal phase                           R93.3, Abnormal findings on diagnostic imaging of                            other parts of digestive tract CPT copyright 2019 American Medical Association. All rights reserved. The codes documented in this report are preliminary and upon coder review may  be revised to meet current compliance requirements. Hildred Laser, MD Hildred Laser, MD 02/16/2020 11:12:17 AM This report has been signed electronically. Number of Addenda: 0

## 2020-02-17 ENCOUNTER — Other Ambulatory Visit: Payer: Self-pay

## 2020-02-20 ENCOUNTER — Encounter: Payer: Self-pay | Admitting: Family Medicine

## 2020-02-20 ENCOUNTER — Other Ambulatory Visit: Payer: Self-pay

## 2020-02-20 ENCOUNTER — Ambulatory Visit (INDEPENDENT_AMBULATORY_CARE_PROVIDER_SITE_OTHER): Payer: Medicare HMO | Admitting: Family Medicine

## 2020-02-20 VITALS — BP 134/70 | HR 76 | Temp 97.8°F | Ht 62.0 in | Wt 125.0 lb

## 2020-02-20 DIAGNOSIS — K5900 Constipation, unspecified: Secondary | ICD-10-CM

## 2020-02-20 DIAGNOSIS — E038 Other specified hypothyroidism: Secondary | ICD-10-CM | POA: Diagnosis not present

## 2020-02-20 LAB — SURGICAL PATHOLOGY

## 2020-02-20 NOTE — Patient Instructions (Signed)
Fiber Content in Foods  See the following list for the dietary fiber content of some common foods. High-fiber foods High-fiber foods contain 4 grams or more (4g or more) of fiber per serving. They include:  Artichoke (fresh) -- 1 medium has 10.3g of fiber.  Baked beans, plain or vegetarian (canned) --  cup has 5.2g of fiber.  Blackberries or raspberries (fresh) --  cup has 4g of fiber.  Bran cereal --  cup has 8.6g of fiber.  Bulgur (cooked) --  cup has 4g of fiber.  Kidney beans (canned) --  cup has 6.8g of fiber.  Lentils (cooked) --  cup has 7.8g of fiber.  Pear (fresh) -- 1 medium has 5.1g of fiber.  Peas (frozen) --  cup has 4.4g of fiber.  Pinto beans (canned) --  cup has 5.5g of fiber.  Pinto beans (dried and cooked) --  cup has 7.7g of fiber.  Potato with skin (baked) -- 1 medium has 4.4g of fiber.  Quinoa (cooked) --  cup has 5g of fiber.  Soybeans (canned, frozen, or fresh) --  cup has 5.1g of fiber. Moderate-fiber foods Moderate-fiber foods contain 1-4 grams (1-4g) of fiber per serving. They include:  Almonds -- 1 oz. has 3.5g of fiber.  Apple with skin -- 1 medium has 3.3g of fiber.  Applesauce, sweetened --  cup has 1.5g of fiber.  Bagel, plain -- one 4-inch (10-cm) bagel has 2g of fiber.  Banana -- 1 medium has 3.1g of fiber.  Broccoli (cooked) --  cup has 2.5g of fiber.  Carrots (cooked) --  cup has 2.3g of fiber.  Corn (canned or frozen) --  cup has 2.1g of fiber.  Corn tortilla -- one 6-inch (15-cm) tortilla has 1.5g of fiber.  Green beans (canned) --  cup has 2g of fiber.  Instant oatmeal --  cup has about 2g of fiber.  Long-grain brown rice (cooked) -- 1 cup has 3.5g of fiber.  Macaroni, enriched (cooked) -- 1 cup has 2.5g of fiber.  Melon -- 1 cup has 1.4g of fiber.  Multigrain cereal --  cup has about 2-4g of fiber.  Orange -- 1 small has 3.1g of fiber.  Potatoes, mashed --  cup has 1.6g of fiber.  Raisins  -- 1/4 cup has 1.6g of fiber.  Squash --  cup has 2.9g of fiber.  Sunflower seeds --  cup has 1.1g of fiber.  Tomato -- 1 medium has 1.5g of fiber.  Vegetable or soy patty -- 1 has 3.4g of fiber.  Whole-wheat bread -- 1 slice has 2g of fiber.  Whole-wheat spaghetti --  cup has 3.2g of fiber. Low-fiber foods Low-fiber foods contain less than 1 gram (less than 1g) of fiber per serving. They include:  Egg -- 1 large.  Flour tortilla -- one 6-inch (15-cm) tortilla.  Fruit juice --  cup.  Lettuce -- 1 cup.  Meat, poultry, or fish -- 1 oz.  Milk -- 1 cup.  Spinach (raw) -- 1 cup.  White bread -- 1 slice.  White rice --  cup.  Yogurt --  cup. Actual amounts of fiber in foods may be different depending on processing. Talk with your dietitian about how much fiber you need in your diet. This information is not intended to replace advice given to you by your health care provider. Make sure you discuss any questions you have with your health care provider. Document Revised: 11/08/2015 Document Reviewed: 05/10/2015 Elsevier Patient Education  [place].

## 2020-02-20 NOTE — Progress Notes (Signed)
   Subjective:    Patient ID: Grace [name], female    DOB: Jul 17, 1945, 74 y.o.   MRN: [id number]  Constipation This is a new problem. The current episode started more than 1 month ago. The problem has been gradually worsening since onset. Her stool frequency is 2 to 3 times per week. The stool is described as firm and formed. The patient is not on a high fiber diet. She exercises regularly. There has been adequate water intake. Associated symptoms include bloating and nausea. Pertinent negatives include no abdominal pain, fever or vomiting.   med check up.   Pt is concerned about constipation. Tried miralax. When she took that it gave her diarrhea so she stopped. Eats prunes at night.  Patient had colonoscopy a few years ago Denies any blood in stool   Review of Systems  Constitutional: Negative for activity change and fever.  HENT: Negative for congestion, ear pain and rhinorrhea.   Eyes: Negative for discharge.  Respiratory: Negative for cough, shortness of breath and wheezing.   Cardiovascular: Negative for chest pain.  Gastrointestinal: Positive for bloating, constipation and nausea. Negative for abdominal pain and vomiting.       Objective:   Physical Exam Vitals reviewed.  Constitutional:      General: She is not in acute distress. HENT:     Head: Normocephalic and atraumatic.  Eyes:     General:        Right eye: No discharge.        Left eye: No discharge.  Neck:     Trachea: No tracheal deviation.  Cardiovascular:     Rate and Rhythm: Normal rate and regular rhythm.     Heart sounds: Normal heart sounds. No murmur heard.   Pulmonary:     Effort: Pulmonary effort is normal. No respiratory distress.     Breath sounds: Normal breath sounds.  Lymphadenopathy:     Cervical: No cervical adenopathy.  Skin:    General: Skin is warm and dry.  Neurological:     Mental Status: She is alert.     Coordination: Coordination normal.  Psychiatric:        Behavior:  Behavior normal.           Assessment & Plan:  1. Constipation, unspecified constipation type Patient had colonoscopy 2017 Check CBC make sure patient not anemic.  Check thyroid function to make sure levothyroxine does not need adjusting Start Colace daily Small amount of MiraLAX on a daily basis-larger amount cause diarrhea If not seeing headway with this over the next 2 to 3 weeks notify us then next step would be potentially Linzess I do not feel CT scan necessary currently Patient being treated for gastroparesis by gastroenterology Stomach biopsies are pending - CBC with Differential/Platelet - TSH - T4, free  2. Other specified hypothyroidism This could be causing some of the constipation issue but with her severe gastroparesis more than likely she is not taking in enough fiber and water and its difficult for her to do so she is currently on Reglan which hopefully will help - CBC with Differential/Platelet - TSH - T4, free

## 2020-02-21 ENCOUNTER — Encounter (HOSPITAL_COMMUNITY): Payer: Self-pay | Admitting: Internal Medicine

## 2020-02-21 ENCOUNTER — Telehealth (INDEPENDENT_AMBULATORY_CARE_PROVIDER_SITE_OTHER): Payer: Self-pay

## 2020-02-21 DIAGNOSIS — R112 Nausea with vomiting, unspecified: Secondary | ICD-10-CM

## 2020-02-21 LAB — CBC WITH DIFFERENTIAL/PLATELET
Basophils Absolute: 0.1 10*3/uL (ref 0.0–0.2)
Basos: 1 %
EOS (ABSOLUTE): 0.1 10*3/uL (ref 0.0–0.4)
Eos: 2 %
Hematocrit: 39.9 % (ref 34.0–46.6)
Hemoglobin: 13.7 g/dL (ref 11.1–15.9)
Immature Grans (Abs): 0 10*3/uL (ref 0.0–0.1)
Immature Granulocytes: 0 %
Lymphocytes Absolute: 1.9 10*3/uL (ref 0.7–3.1)
Lymphs: 24 %
MCH: 31.1 pg (ref 26.6–33.0)
MCHC: 34.3 g/dL (ref 31.5–35.7)
MCV: 91 fL (ref 79–97)
Monocytes Absolute: 0.8 10*3/uL (ref 0.1–0.9)
Monocytes: 10 %
Neutrophils Absolute: 4.9 10*3/uL (ref 1.4–7.0)
Neutrophils: 63 %
Platelets: 220 10*3/uL (ref 150–450)
RBC: 4.41 x10E6/uL (ref 3.77–5.28)
RDW: 11.9 % (ref 11.7–15.4)
WBC: 7.8 10*3/uL (ref 3.4–10.8)

## 2020-02-21 LAB — TSH: TSH: 1.14 u[IU]/mL (ref 0.450–4.500)

## 2020-02-21 LAB — T4, FREE: Free T4: 1.39 ng/dL (ref 0.82–1.77)

## 2020-02-21 NOTE — Telephone Encounter (Signed)
Grace [name], CMA  

## 2020-03-19 ENCOUNTER — Other Ambulatory Visit: Payer: Self-pay | Admitting: Family Medicine

## 2020-03-20 ENCOUNTER — Emergency Department (HOSPITAL_COMMUNITY)
Admission: EM | Admit: 2020-03-20 | Discharge: 2020-03-20 | Disposition: A | Discharge status: Home / Self Care | Payer: Medicare HMO | Attending: Emergency Medicine | Admitting: Emergency Medicine

## 2020-03-20 ENCOUNTER — Emergency Department (HOSPITAL_COMMUNITY): Payer: Medicare HMO

## 2020-03-20 ENCOUNTER — Encounter (HOSPITAL_COMMUNITY): Payer: Self-pay

## 2020-03-20 DIAGNOSIS — R55 Syncope and collapse: Secondary | ICD-10-CM

## 2020-03-20 DIAGNOSIS — E039 Hypothyroidism, unspecified: Secondary | ICD-10-CM | POA: Diagnosis not present

## 2020-03-20 DIAGNOSIS — K7689 Other specified diseases of liver: Secondary | ICD-10-CM | POA: Insufficient documentation

## 2020-03-20 DIAGNOSIS — R112 Nausea with vomiting, unspecified: Secondary | ICD-10-CM | POA: Diagnosis not present

## 2020-03-20 DIAGNOSIS — Z79899 Other long term (current) drug therapy: Secondary | ICD-10-CM | POA: Diagnosis not present

## 2020-03-20 DIAGNOSIS — N281 Cyst of kidney, acquired: Secondary | ICD-10-CM | POA: Insufficient documentation

## 2020-03-20 DIAGNOSIS — Z85828 Personal history of other malignant neoplasm of skin: Secondary | ICD-10-CM | POA: Insufficient documentation

## 2020-03-20 DIAGNOSIS — W01198A Fall on same level from slipping, tripping and stumbling with subsequent striking against other object, initial encounter: Secondary | ICD-10-CM | POA: Diagnosis not present

## 2020-03-20 LAB — COMPREHENSIVE METABOLIC PANEL
ALT: 21 U/L (ref 0–44)
AST: 26 U/L (ref 15–41)
Albumin: 4 g/dL (ref 3.5–5.0)
Alkaline Phosphatase: 53 U/L (ref 38–126)
Anion gap: 9 (ref 5–15)
BUN: 8 mg/dL (ref 8–23)
CO2: 25 mmol/L (ref 22–32)
Calcium: 8.4 mg/dL — ABNORMAL LOW (ref 8.9–10.3)
Chloride: 101 mmol/L (ref 98–111)
Creatinine, Ser: 0.82 mg/dL (ref 0.44–1.00)
GFR, Estimated: >60 mL/min (ref >60)
Glucose, Bld: 126 mg/dL — ABNORMAL HIGH (ref 70–99)
Potassium: 3.8 mmol/L (ref 3.5–5.1)
Sodium: 135 mmol/L (ref 135–145)
Total Bilirubin: 1 mg/dL (ref 0.3–1.2)
Total Protein: 6.5 g/dL (ref 6.5–8.1)

## 2020-03-20 LAB — CBC WITH DIFFERENTIAL/PLATELET
Abs Immature Granulocytes: 0.06 10*3/uL (ref 0.00–0.07)
Basophils Absolute: 0 10*3/uL (ref 0.0–0.1)
Basophils Relative: 0 %
Eosinophils Absolute: 0 10*3/uL (ref 0.0–0.5)
Eosinophils Relative: 0 %
HCT: 36.2 % (ref 36.0–46.0)
Hemoglobin: 12.3 g/dL (ref 12.0–15.0)
Immature Granulocytes: 1 %
Lymphocytes Relative: 4 %
Lymphs Abs: 0.5 10*3/uL — ABNORMAL LOW (ref 0.7–4.0)
MCH: 31 pg (ref 26.0–34.0)
MCHC: 34 g/dL (ref 30.0–36.0)
MCV: 91.2 fL (ref 80.0–100.0)
Monocytes Absolute: 1 10*3/uL (ref 0.1–1.0)
Monocytes Relative: 9 %
Neutro Abs: 8.9 10*3/uL — ABNORMAL HIGH (ref 1.7–7.7)
Neutrophils Relative %: 86 %
Platelets: 153 10*3/uL (ref 150–400)
RBC: 3.97 MIL/uL (ref 3.87–5.11)
RDW: 12.3 % (ref 11.5–15.5)
WBC: 10.4 10*3/uL (ref 4.0–10.5)
nRBC: 0 % (ref 0.0–0.2)

## 2020-03-20 LAB — TROPONIN I (HIGH SENSITIVITY): Troponin I (High Sensitivity): 3 ng/L (ref <18)

## 2020-03-20 MED ORDER — IOHEXOL 300 MG/ML  SOLN
100.0000 mL | Freq: Once | INTRAMUSCULAR | Status: AC | PRN
  Administered 2020-03-20: 100 mL via INTRAVENOUS

## 2020-03-20 MED ORDER — SODIUM CHLORIDE 0.9 % IV BOLUS
500.0000 mL | Freq: Once | INTRAVENOUS | Status: AC
  Administered 2020-03-20: 500 mL via INTRAVENOUS

## 2020-03-20 NOTE — ED Notes (Signed)
Pt daughter Lenna Sciara called to pick pt up.

## 2020-03-20 NOTE — Discharge Instructions (Signed)
As we discussed, your work-up today is reassuring.  I suspect your syncopal episode was from low blood pressure.  It is important that you make sure you are staying hydrated.  Today we did your CT abdomen pelvis.  As we discussed, there is a small, benign-appearing cyst on the liver and the kidneys.  These do not look suspicious and there is nothing new about them toda.let your primary care doctor know so that he monitor for follow-up.  Please notify your GI doctor regarding the CT scan.  Return to the emergency department for any feeling like you are going to pass out, numbness/weakness of your arms or legs, vomiting, chest pain or any other worsening concerning symptoms.

## 2020-03-20 NOTE — ED Notes (Signed)
Pt discharged from this ED in stable condition at this time. All discharge instructions and follow up care reviewed with pt with no further questions at this time. Pt ambulatory with steady gait, clear speech.  

## 2020-03-20 NOTE — ED Notes (Signed)
Patient provided sprite

## 2020-03-20 NOTE — ED Provider Notes (Signed)
[place] DEPT Provider Note   CSN: [id number] Arrival date & time: 03/20/20  1137     History Chief Complaint  Patient presents with  . Loss of Consciousness    Grace [name] is a 74 y.o. female BIB EMS who presents for evaluation of syncopal episode.  Patient reports that she was at a store this morning shopping.  She was in line when she started feeling lightheaded, felt like her vision was blurry and then had a syncopal episode.  She reports hitting the ground and states that she hit her head.  She states she did not have any preceding chest pain.  When EMS got there, she was noted be hypotensive when they sat her up with systolic blood pressures in the 70s.  She is also having some nausea but denied any vomiting.  Patient states that this morning, she felt "unwell" and stated she had a little bit of cough, congestion.  She took some Mucinex and that made her feel better.  She reports that she only ate one small sweet roll for breakfast which he states is abnormal for her.  She thought that they were going to get more food later but they never did.  She states that she had any recent fevers, chills.  She has not had any abdominal pain.  She states she has had some issues with nausea, decreased appetite that is been ongoing for a while.  She has been following up with her GI doctor and they had wanted to get a CT scan.  Patient denies any neck pain, back pain, vision changes, numbness/weakness of arms or legs.  The history is provided by the patient.       Past Medical History:  Diagnosis Date  . Cancer (HCC)    Skin Cancer, Squamous Cell  . Diastolic dysfunction 20/25/4270   Ejection fraction 75%.  . Dry eyes, bilateral   . Family history of anesthesia complication    Mother was hard to wake up after anesthesia  . Frequency of urination   . Heart murmur   . Hyperlipidemia    Diet controlled  . Hypothyroid   . Migraine 2013  . Mitral valve  prolapse   . Stroke [facility])    3 episodes in past yr  . TIA (transient ischemic attack) 02/19/2012    Patient Active Problem List   Diagnosis Date Noted  . Statin myopathy 03/10/2019  . Myalgia 03/10/2019  . Heme + stool 01/20/2019  . Esophageal dysphagia 12/22/2017  . Exertional angina ([place]) 07/17/2017  . History of colonic polyps 11/26/2015  . Genital herpes simplex type 2 05/14/2015  . Hyperlipemia 01/15/2015  . Pernicious anemia 11/04/2013  . Fatigue 09/19/2013  . Early satiety 07/27/2013  . Dysphagia, unspecified(787.20) 07/27/2013  . Loss of weight 07/18/2013  . Osteoporosis 01/25/2013  . GERD (gastroesophageal reflux disease) 11/16/2012  . Irritable bowel syndrome 11/16/2012  . Sinus bradycardia 02/20/2012  . Diastolic dysfunction 62/37/6283  . TIA (transient ischemic attack) 02/19/2012  . Heart murmur 02/19/2012  . Hypothyroidism 11/28/2010  . Hypercholesterolemia 11/28/2010    Past Surgical History:  Procedure Laterality Date  . BALLOON DILATION N/A 07/28/2013   Procedure: BALLOON DILATION;  Surgeon: Rogene Houston, MD;  [facility];  Service: Endoscopy;  Laterality: N/A;  . BIOPSY  02/16/2020   Procedure: BIOPSY;  Surgeon: Rogene Houston, MD;  [facility];  Service: Endoscopy;;  . BREAST BIOPSY     left benign  tumor  . CATARACT EXTRACTION W/PHACO Right 07/11/2013   Procedure: CATARACT EXTRACTION PHACO AND INTRAOCULAR LENS PLACEMENT (IOC);  Surgeon: Tonny Branch, MD;  Location: [facility];  Service: Ophthalmology;  Laterality: Right;  CDE 9.99  . CATARACT EXTRACTION W/PHACO Left 08/04/2013   Procedure: CATARACT EXTRACTION PHACO AND INTRAOCULAR LENS PLACEMENT (IOC);  Surgeon: Tonny Branch, MD;  Location: [facility];  Service: Ophthalmology;  Laterality: Left;  CDE:16.26  . CHOLECYSTECTOMY    . COLONOSCOPY  12/18/2010   Procedure: COLONOSCOPY;  Surgeon: Rogene Houston, MD;  [facility];  Service: Endoscopy;  Laterality: N/A;  1:00 pm  .  COLONOSCOPY N/A 03/06/2016   Procedure: COLONOSCOPY;  Surgeon: Rogene Houston, MD;  [facility];  Service: Endoscopy;  Laterality: N/A;  1200  . ESOPHAGEAL DILATION N/A 01/27/2018   Procedure: ESOPHAGEAL DILATION;  Surgeon: Rogene Houston, MD;  [facility];  Service: Endoscopy;  Laterality: N/A;  . ESOPHAGEAL DILATION N/A 02/16/2020   Procedure: ESOPHAGEAL DILATION;  Surgeon: Rogene Houston, MD;  [facility];  Service: Endoscopy;  Laterality: N/A;  . ESOPHAGOGASTRODUODENOSCOPY    . ESOPHAGOGASTRODUODENOSCOPY N/A 07/28/2013   Procedure: ESOPHAGOGASTRODUODENOSCOPY (EGD);  Surgeon: Rogene Houston, MD;  [facility];  Service: Endoscopy;  Laterality: N/A;  125  . ESOPHAGOGASTRODUODENOSCOPY N/A 01/27/2018   Procedure: ESOPHAGOGASTRODUODENOSCOPY (EGD);  Surgeon: Rogene Houston, MD;  [facility];  Service: Endoscopy;  Laterality: N/A;  2:00-moved to 10/30 @ 2:45pm per Lelon Frohlich  . ESOPHAGOGASTRODUODENOSCOPY N/A 02/16/2020   Procedure: ESOPHAGOGASTRODUODENOSCOPY (EGD);  Surgeon: Rogene Houston, MD;  [facility];  Service: Endoscopy;  Laterality: N/A;  10:00  . LEFT HEART CATH AND CORONARY ANGIOGRAPHY N/A 07/17/2017   Procedure: LEFT HEART CATH AND CORONARY ANGIOGRAPHY;  Surgeon: Sherren Mocha, MD;  Location: [facility];  Service: Cardiovascular;  Laterality: N/A;  . MALONEY DILATION N/A 07/28/2013   Procedure: Venia Minks DILATION;  Surgeon: Rogene Houston, MD;  [facility];  Service: Endoscopy;  Laterality: N/A;  . SAVORY DILATION N/A 07/28/2013   Procedure: SAVORY DILATION;  Surgeon: Rogene Houston, MD;  [facility];  Service: Endoscopy;  Laterality: N/A;  . THYROIDECTOMY  11/20/2011   Procedure: THYROIDECTOMY;  Surgeon: Izora Gala, MD;  Location: [facility];  Service: ENT;  Laterality: Left;  LEFT THYROID LOBECTOMY  . TUBAL LIGATION     39 yrs ago.     OB History   No obstetric history on file.      Family History  Problem Relation Age of Onset  . Heart disease Mother   . Hypertension Mother   . Stroke Mother   . Thyroid disease Mother   . Stroke Maternal Grandmother   . Cancer Paternal Grandmother        breast  . Colon cancer Neg Hx     Social History   Tobacco Use  . Smoking status: Never Smoker  . Smokeless tobacco: Never Used  Vaping Use  . Vaping Use: Never used  Substance Use Topics  . Alcohol use: No    Alcohol/week: 0.0 standard drinks  . Drug use: No    Home Medications Prior to Admission medications   Medication Sig Start Date End Date Taking? Authorizing Provider  Calcium Carb-Cholecalciferol (CALCIUM+D3) 600-800 MG-UNIT TABS Take 1 tablet by mouth daily.    [provider]  calcium carbonate (OS-CAL - DOSED IN MG OF ELEMENTAL CALCIUM) 1250 (500 Ca) MG tablet  Take 1 tablet (500 mg of elemental calcium total) by mouth 2 (two) times daily with a meal. 01/17/20   Nida, Marella Chimes, MD  Cholecalciferol (VITAMIN D3) 50 MCG (2000 UT) capsule Take 2,000 Units by mouth daily.     [provider]  ezetimibe (ZETIA) 10 MG tablet TAKE ONE TABLET BY MOUTH ONCE DAILY. 03/19/20   Kathyrn Drown, MD  levothyroxine (SYNTHROID) 75 MCG tablet TAKE 1 TABLET TUESDAY TO   SUNDAY AND 1/2 TABLET ON   MONDAY Patient taking differently: Take 37.5-75 mcg by mouth See admin instructions. Take 75 mcg Tuesday -Sunday and 37.5 mcg on [facility] 11/09/19   Kathyrn Drown, MD  metoCLOPramide (REGLAN) 10 MG tablet Take 1 tablet (10 mg total) by mouth 3 (three) times daily before meals. 02/16/20   Rogene Houston, MD  Multiple Vitamins-Minerals (ALIVE WOMENS 50+ PO) Take 1 tablet by mouth daily.    [provider]  ondansetron (ZOFRAN ODT) 4 MG disintegrating tablet Take 1 tablet (4 mg total) by mouth every 8 (eight) hours as needed for nausea or vomiting. 08/24/19   Mikey Kirschner, MD  vitamin B-12 (CYANOCOBALAMIN) 1000 MCG tablet Take 1,000 mcg by mouth  daily.    [provider]  zoledronic acid (RECLAST) 5 MG/100ML SOLN injection Inject 5 mg into the vein once. Yearly    Kathyrn Drown, MD    Allergies    Compazine, Statins, and Sulfa drugs cross reactors  Review of Systems   Review of Systems  Constitutional: Negative for fever.  Respiratory: Negative for cough and shortness of breath.   Cardiovascular: Negative for chest pain.  Gastrointestinal: Negative for abdominal pain, nausea and vomiting.  Genitourinary: Negative for dysuria and hematuria.  Neurological: Positive for syncope and light-headedness. Negative for headaches.  All other systems reviewed and are negative.   Physical Exam Updated Vital Signs BP (!) 129/55   Pulse 66   Temp 98.1 F (36.7 C) (Oral)   Resp (!) 21   SpO2 97%   Physical Exam Vitals and nursing note reviewed.  Constitutional:      Appearance: Normal appearance. She is well-developed and well-nourished.  HENT:     Head: Normocephalic and atraumatic.     Comments: No tenderness to palpation of skull. No deformities or crepitus noted. No open wounds, abrasions or lacerations.     Mouth/Throat:     Mouth: Oropharynx is clear and moist and mucous membranes are normal.  Eyes:     General: Lids are normal.     Extraocular Movements: EOM normal.     Conjunctiva/sclera: Conjunctivae normal.     Pupils: Pupils are equal, round, and reactive to light.     Comments: PERRL. EOMs intact. No nystagmus. No neglect.   Neck:     Comments: Full flexion/extension and lateral movement of neck fully intact. No bony midline tenderness. No deformities or crepitus.  Cardiovascular:     Rate and Rhythm: Normal rate and regular rhythm.     Pulses: Normal pulses.     Heart sounds: Normal heart sounds. No murmur heard. No friction rub. No gallop.   Pulmonary:     Effort: Pulmonary effort is normal.     Breath sounds: Normal breath sounds.     Comments: Lungs clear to auscultation bilaterally.  Symmetric  chest rise.  No wheezing, rales, rhonchi. Abdominal:     Palpations: Abdomen is soft. Abdomen is not rigid.     Tenderness: There is no abdominal tenderness.  There is no guarding.     Comments: Abdomen is soft, non-distended, non-tender. No rigidity, No guarding. No peritoneal signs.  Musculoskeletal:        General: Normal range of motion.     Cervical back: Full passive range of motion without pain.  Skin:    General: Skin is warm and dry.     Capillary Refill: Capillary refill takes less than 2 seconds.  Neurological:     Mental Status: She is alert and oriented to person, place, and time.     Comments: Cranial nerves III-XII intact Follows commands, Moves all extremities  5/5 strength to BUE and BLE  Sensation intact throughout all major nerve distributions No slurred speech. No facial droop.   Psychiatric:        Mood and Affect: Mood and affect normal.        Speech: Speech normal.     ED Results / Procedures / Treatments   Labs (all labs ordered are listed, but only abnormal results are displayed) Labs Reviewed  COMPREHENSIVE METABOLIC PANEL - Abnormal; Notable for the following components:      Result Value   Glucose, Bld 126 (*)    Calcium 8.4 (*)    All other components within normal limits  CBC WITH DIFFERENTIAL/PLATELET - Abnormal; Notable for the following components:   Neutro Abs 8.9 (*)    Lymphs Abs 0.5 (*)    All other components within normal limits  TROPONIN I (HIGH SENSITIVITY)    EKG EKG Interpretation  Date/Time:  Tuesday March 20 2020 11:55:51 EST Ventricular Rate:  68 PR Interval:    QRS Duration: 88 QT Interval:  386 QTC Calculation: 411 R Axis:   -11 Text Interpretation: Sinus rhythm Anterior infarct, age indeterminate No significant change since last tracing Confirmed by Blanchie Dessert [phone]) on 03/20/2020 12:29:17 PM   Radiology CT Head Wo Contrast  Result Date: 03/20/2020 CLINICAL DATA:  Head trauma EXAM: CT HEAD WITHOUT  CONTRAST TECHNIQUE: Contiguous axial images were obtained from the base of the skull through the vertex without intravenous contrast. COMPARISON:  02/09/2017 FINDINGS: Brain: No evidence of acute infarction, hemorrhage, hydrocephalus, extra-axial collection or mass lesion/mass effect. Vascular: No hyperdense vessel or unexpected calcification. Skull: Normal. Negative for fracture or focal lesion. Sinuses/Orbits: The visualized paranasal sinuses are essentially clear. The mastoid air cells are unopacified. Other: None. IMPRESSION: Normal head CT. Electronically Signed   By: Julian Hy M.D.   On: 03/20/2020 14:30   CT ABDOMEN PELVIS W CONTRAST  Result Date: 03/20/2020 CLINICAL DATA:  Syncope, hypotension, nausea/vomiting EXAM: CT ABDOMEN AND PELVIS WITH CONTRAST TECHNIQUE: Multidetector CT imaging of the abdomen and pelvis was performed using the standard protocol following bolus administration of intravenous contrast. CONTRAST:  164mL OMNIPAQUE IOHEXOL 300 MG/ML  SOLN COMPARISON:  12/13/2018 FINDINGS: Lower chest: Lung bases are clear. Hepatobiliary: Liver is notable for a subcentimeter cyst in the right hepatic lobe (series 3/image 23), unchanged. Status post cholecystectomy. No intrahepatic or extrahepatic ductal dilatation. Pancreas: Within normal limits. Spleen: Within normal limits. Adrenals/Urinary Tract: Adrenal glands are within normal limits. 4 mm cyst in the interpolar right kidney (series 3/image 29). Left kidney is within normal limits. No hydronephrosis. Bladder is within normal limits. Stomach/Bowel: Stomach is within normal limits. No evidence of bowel obstruction. Appendix is not discretely visualized. Mild to moderate left colonic stool burden. Vascular/Lymphatic: No evidence of abdominal aortic aneurysm. Atherosclerotic calcifications of the abdominal aorta and branch vessels. No suspicious abdominopelvic lymphadenopathy. Reproductive: Uterus is  notable for a 12 mm intramural right  uterine body fibroid (series 3/image 59). Bilateral ovaries are unremarkable. Other: No abdominopelvic ascites. Musculoskeletal: Visualized osseous structures are within normal limits. IMPRESSION: Unremarkable CT abdomen/pelvis, with stable ancillary findings as above. Electronically Signed   By: Julian Hy M.D.   On: 03/20/2020 14:36    Procedures Procedures (including critical care time)  Medications Ordered in ED Medications  sodium chloride 0.9 % bolus 500 mL (500 mLs Intravenous New Bag/Given 03/20/20 1229)  iohexol (OMNIPAQUE) 300 MG/ML solution 100 mL (100 mLs Intravenous Contrast Given 03/20/20 1358)    ED Course  I have reviewed the triage vital signs and the nursing notes.  Pertinent labs & imaging results that were available during my care of the patient were reviewed by me and considered in my medical decision making (see chart for details).    MDM Rules/Calculators/A&P                          74 year old female who presents for evaluation of syncopal episode.  Patient was at the store when she felt lightheaded and had a syncopal episode, hitting her head.  She had no preceding chest pain.  On initial arrival, she is afebrile, nontoxic-appearing.  Blood pressure improved after 500 cc bolus done by EMS.  On exam, she has no neurological deficits.  She is not on blood thinner but since he hit her head, we will obtain CT imaging.  Additionally, will plan to check labs, EKG for evaluation of syncope.  Trop is negative. CMP shows normal BUN/CR. Glucose is 126. CBC shows no leukocytosis or anemia. Head CT is negative. CT abdomen shows a subcentimeter cyst in the right hepatic lobe. There is also a 4 mm cyst in the right kidney. There is a 12 mm uterine fibroid.  Orthostatic VS for the past 24 hrs:  BP- Lying Pulse- Lying BP- Sitting Pulse- Sitting BP- Standing at 0 minutes Pulse- Standing at 0 minutes  03/20/20 1427 124/65 77 122/60 80 125/58 82   Reevaluation.  Patient is  hemodynamic stable.  Blood pressure is 122/60.  She has been able tolerate p.o. without any difficulty.  I ambulated the patient with no signs of gait ataxia.  Her repeat neuro exam was reassuring.  Patient reports that she still feels like her head "feels different" but has difficulty elaborating on that sensation.  We discussed at length regarding further work-up here in the ED.  We discussed the possibility of further evaluating with MRI for evaluation of an acute stroke.  My suspicion for an acute CVA is low given her reassuring neuro exam, ability to walk without any signs of gait ataxia.  I suspect her headache is from falling and hitting her head on the floor and some potential postconcussive syndrome.  I suspect her syncopal episode was likely from orthostatic hypotension as her blood pressure was low when EMS got there.  After discussing extensively regarding further evaluation here in the ED and discussing risk first benefits of obtaining versus declining an MRI here in the ED, patient wished to decline MRI.  She states that she feels like it is probably from where she hit her head and she would like to follow-up with her primary care doctor.  Given her reassuring work-up here, I feel that this is reasonable. Discussed patient with Dr. Maryan Rued who is agreeable to plan. At this time, patient exhibits no emergent life-threatening condition that require further evaluation in  ED. Patient had ample opportunity for questions and discussion. All patient's questions were answered with full understanding. Strict return precautions discussed. Patient expresses understanding and agreement to plan.   Portions of this note were generated with Lobby[employer]. Dictation errors may occur despite best attempts at proofreading.   Final Clinical Impression(s) / ED Diagnoses Final diagnoses:  Syncope, unspecified syncope type    Rx / DC Orders ED Discharge Orders    None       Desma Mcgregor 03/20/20 1544    Blanchie Dessert, MD 03/22/20 2213

## 2020-03-20 NOTE — ED Triage Notes (Signed)
Pt arrived via EMS, syncope while out shopping, pt endorses feeling dizzy at the time, hit head on ground. Pt does not remember passing out. Not on blood thinners. Per EMS, BP WNL on arrival, dropped to 85F systolic once sat up. Pt also became nauseated at time.   Given 4mg  zofran and 500cc NS en route.

## 2020-03-21 ENCOUNTER — Telehealth (INDEPENDENT_AMBULATORY_CARE_PROVIDER_SITE_OTHER): Payer: Medicare HMO | Admitting: Family Medicine

## 2020-03-21 ENCOUNTER — Ambulatory Visit (HOSPITAL_COMMUNITY): Payer: Medicare HMO

## 2020-03-21 ENCOUNTER — Other Ambulatory Visit: Payer: Self-pay

## 2020-03-21 DIAGNOSIS — J019 Acute sinusitis, unspecified: Secondary | ICD-10-CM

## 2020-03-21 DIAGNOSIS — J069 Acute upper respiratory infection, unspecified: Secondary | ICD-10-CM | POA: Diagnosis not present

## 2020-03-21 MED ORDER — AMOXICILLIN 500 MG PO TABS: 500.0000 mg | ORAL_TABLET | Freq: Three times a day (TID) | ORAL | 0 refills | Status: DC

## 2020-03-21 NOTE — Progress Notes (Signed)
   Subjective:    Patient ID: Grace [name], female    DOB: 02-27-1946, 74 y.o.   MRN: [id number]  HPI Initially virtual visit but then patient was brought to the office in order to properly evaluate Virtual Visit via Video Note  I connected with [name] on 03/21/20 at  9:30 AM EST by a video enabled telemedicine application and verified that I am speaking with the correct person using two identifiers.  Location: Patient: home Provider: office    I discussed the limitations of evaluation and management by telemedicine and the availability of in person appointments. The patient expressed understanding and agreed to proceed.  History of Present Illness:    Observations/Objective:   Assessment and Plan:   Follow Up Instructions:    I discussed the assessment and treatment plan with the patient. The patient was provided an opportunity to ask questions and all were answered. The patient agreed with the plan and demonstrated an understanding of the instructions.   The patient was advised to call back or seek an in-person evaluation if the symptoms worsen or if the condition fails to improve as anticipated.  I provided 20 minutes of non-face-to-face time during this encounter. Due to the complexity of her issues patient is brought to the office to be seen   Patient following up from ER visit yesterday from a syncope episode while out shopping.  Patient states she did not hurt anything when she passed out but was told at the ER that she has some orthostatic hypotension. Patient feeling ok today but states her "head doesn't feel quite right".  Patient is going to look for a bp cuff around her house and hopefully have a reading when Dr. Nicki Reaper reaches out to her.  This patient states at times she feels fatigued sometimes lightheaded she was told she had orthostatic hypotension at the ER She denies any other major setbacks  Patient complains of nausea, no appetite, congestion,  cough and slight headache x 2 days- has used mucinex at home.  Relates congestion sore throat not feeling good past couple days no wheezing or difficulty breathing Review of Systems     Objective:   Physical Exam  Lungs clear heart regular HEENT benign      Assessment & Plan:  Viral syndrome Given a prescription antibiotics just in case this progresses into a sinus infection Covid test was taken Lay low next few days Blood pressure laying sitting standing no appreciable change If any further passing out spells let us know but more than likely this was vasovagal syncope due to not eating and drinking enough on that particular day

## 2020-03-23 LAB — NOVEL CORONAVIRUS, NAA: SARS-CoV-2, NAA: DETECTED — AB

## 2020-03-23 LAB — SARS-COV-2, NAA 2 DAY TAT

## 2020-03-25 ENCOUNTER — Other Ambulatory Visit: Payer: Self-pay | Admitting: Family Medicine

## 2020-04-02 ENCOUNTER — Ambulatory Visit: Payer: Medicare HMO | Admitting: Family Medicine

## 2020-04-02 ENCOUNTER — Telehealth: Payer: Self-pay | Admitting: Family Medicine

## 2020-04-02 ENCOUNTER — Telehealth (INDEPENDENT_AMBULATORY_CARE_PROVIDER_SITE_OTHER): Payer: Medicare HMO | Admitting: Family Medicine

## 2020-04-02 ENCOUNTER — Other Ambulatory Visit: Payer: Self-pay

## 2020-04-02 ENCOUNTER — Encounter: Payer: Self-pay | Admitting: Family Medicine

## 2020-04-02 DIAGNOSIS — H65111 Acute and subacute allergic otitis media (mucoid) (sanguinous) (serous), right ear: Secondary | ICD-10-CM

## 2020-04-02 DIAGNOSIS — U071 COVID-19: Secondary | ICD-10-CM

## 2020-04-02 MED ORDER — ALPRAZOLAM 0.25 MG PO TABS: ORAL_TABLET | ORAL | 0 refills | Status: DC

## 2020-04-02 MED ORDER — AMOXICILLIN 500 MG PO CAPS: ORAL_CAPSULE | ORAL | 0 refills | Status: DC

## 2020-04-02 NOTE — Progress Notes (Signed)
   Subjective:    Patient ID: Grace [name], female    DOB: 09-29-1945, 75 y.o.   MRN: [id number]  HPI Pt following up from ER visit and phone visit from 03/21/20. Pt states she is not doing well. Having left ear pain, congestion and hoarseness. Patient had positive Covid test but no shortness of breath Patient with ear pain discomfort drainage sinus pressure denies high fever chills  Also she recently found out that her close friend is in the process of dying.   Virtual Visit via Telephone Note  I connected with Grace [name] on 04/02/20 at 10:00 AM EST by telephone and verified that I am speaking with the correct person using two identifiers.  Location: Patient: home Provider: office   I discussed the limitations, risks, security and privacy concerns of performing an evaluation and management service by telephone and the availability of in person appointments. I also discussed with the patient that there may be a patient responsible charge related to this service. The patient expressed understanding and agreed to proceed.   History of Present Illness:    Observations/Objective:   Assessment and Plan:   Follow Up Instructions:    I discussed the assessment and treatment plan with the patient. The patient was provided an opportunity to ask questions and all were answered. The patient agreed with the plan and demonstrated an understanding of the instructions.   The patient was advised to call back or seek an in-person evaluation if the symptoms worsen or if the condition fails to improve as anticipated.  I provided 25 minutes of non-face-to-face time during this encounter.       Review of Systems Head congestion drainage coughing ear pain denies high fever chills    Objective:   Physical Exam Right otitis probable Unable to do physical exam because of patient did not have video capability today        Assessment & Plan:  Covid infection gradually getting better  no warning signs going on  Otitis amoxicillin for the next 7 days If ongoing troubles follow-up Ongoing constipation issues She will try stool softeners over the next couple weeks if this doesn't help we will set her up with gastroenterology  Under a lot of anxiety and stress related to her close friend is not doing well with Covid-on ventilator had stroke

## 2020-04-02 NOTE — Telephone Encounter (Signed)
Ms. jalayiah, bibian are scheduled for a virtual visit with your provider today.    Just as we do with appointments in the office, we must obtain your consent to participate.  Your consent will be active for this visit and any virtual visit you may have with one of our providers in the next 365 days.    If you have a MyChart account, I can also send a copy of this consent to you electronically.  All virtual visits are billed to your insurance company just like a traditional visit in the office.  As this is a virtual visit, video technology does not allow for your provider to perform a traditional examination.  This may limit your provider's ability to fully assess your condition.  If your provider identifies any concerns that need to be evaluated in person or the need to arrange testing such as labs, EKG, etc, we will make arrangements to do so.    Although advances in technology are sophisticated, we cannot ensure that it will always work on either your end or our end.  If the connection with a video visit is poor, we may have to switch to a telephone visit.  With either a video or telephone visit, we are not always able to ensure that we have a secure connection.   I need to obtain your verbal consent now.   Are you willing to proceed with your visit today?   CHANAE GEMMA has provided verbal consent on 04/02/2020 for a virtual visit (video or telephone).   Marlowe Shores, LPN 06/02/3297  2:42 AM

## 2020-04-06 ENCOUNTER — Other Ambulatory Visit: Payer: Self-pay | Admitting: *Deleted

## 2020-04-06 ENCOUNTER — Telehealth: Payer: Self-pay | Admitting: Family Medicine

## 2020-04-06 MED ORDER — CEFPROZIL 500 MG PO TABS: 500.0000 mg | ORAL_TABLET | Freq: Two times a day (BID) | ORAL | 0 refills | Status: DC

## 2020-04-06 NOTE — Telephone Encounter (Signed)
Patient states still has earpain and congested.she was seen on 04/02/20. [employer]

## 2020-04-06 NOTE — Telephone Encounter (Signed)
Seen 1/3 and woke up feeling worse this morning. More congestion. Taking amoxil tid that was prescribed on 1/3. No sob no fever, having ear pain and congestion. [place]

## 2020-04-06 NOTE — Telephone Encounter (Signed)
Called and discussed with pt. She verbalized understanding and new med sent to pharm.

## 2020-04-06 NOTE — Telephone Encounter (Signed)
May change Cefzil 500 mg twice daily for 7 days. Saline nasal spray humidifier as needed If severe shortness of breath go to ER Otherwise follow-up next week if not doing better by the middle of the week

## 2020-04-13 ENCOUNTER — Other Ambulatory Visit: Payer: Self-pay

## 2020-04-13 ENCOUNTER — Ambulatory Visit (INDEPENDENT_AMBULATORY_CARE_PROVIDER_SITE_OTHER): Payer: Medicare HMO | Admitting: Family Medicine

## 2020-04-13 DIAGNOSIS — H9203 Otalgia, bilateral: Secondary | ICD-10-CM | POA: Diagnosis not present

## 2020-04-13 NOTE — Progress Notes (Signed)
   Subjective:    Patient ID: Grace [name], female    DOB: 1945-06-01, 75 y.o.   MRN: [id number]  HPI  Patient arrives with ongoing ear pain- has finished antibiotic This patient is having bilateral ear pain sugars had a prolonged course since having COVID and she has had head congestion drainage coughing denies wheezing difficulty breathing has finished 2 rounds of antibiotics denies high fever chills sweats Review of Systems Please see above    Objective:   Physical Exam Eardrums are normal throat is normal no redness no fluid lungs clear no crackles heart regular       Assessment & Plan:  Eustachian tube dysfunction no sign of any infection currently hold off on any antibiotics patient will give [place] feedback next week how she is doing if ongoing troubles may need ENT

## 2020-05-25 ENCOUNTER — Telehealth: Payer: Self-pay | Admitting: Family Medicine

## 2020-05-25 NOTE — Telephone Encounter (Signed)
Patient is requesting a referral to ENT  For her ears and also wanting you to know had TIA on 2/15

## 2020-05-25 NOTE — Telephone Encounter (Signed)
Please advise. Thank you

## 2020-05-26 NOTE — Telephone Encounter (Signed)
1.  Please clarify with patient what does she mean about having TIA?  What were her symptoms?  How long the phalanx?  May have referral to ENT because of ongoing ear pain

## 2020-05-28 ENCOUNTER — Other Ambulatory Visit: Payer: Self-pay | Admitting: *Deleted

## 2020-05-28 DIAGNOSIS — Z131 Encounter for screening for diabetes mellitus: Secondary | ICD-10-CM

## 2020-05-28 DIAGNOSIS — E785 Hyperlipidemia, unspecified: Secondary | ICD-10-CM

## 2020-05-28 DIAGNOSIS — H9209 Otalgia, unspecified ear: Secondary | ICD-10-CM

## 2020-05-28 NOTE — Telephone Encounter (Signed)
I would recommend 81 mg aspirin daily Patient to do up-to-date lipid profile diagnosis hyperlipidemia  It would also be in the patient's best interest to go ahead with follow-up consultation with neurology [facility] she saw them a couple years ago for similar related symptoms

## 2020-05-28 NOTE — Telephone Encounter (Signed)
Left message to return call to discuss with pt and bw orders were put in.

## 2020-05-28 NOTE — Telephone Encounter (Signed)
Pt states On 2/15 was sitting on cough and vision got blurry and then went to call someone and could not get her words out. States this was her symptoms the other times she had TIA in the past. States her head did not feel right. She laid down and closed her eyes for a few minutes and then she felt ok. Lasted about 30 mins total. States she has been ok since then.   Pt notified ent referral was put in.

## 2020-05-28 NOTE — Telephone Encounter (Signed)
Discussed with pt. Pt declines referral to neurology because she states the last time they did not help her. Advised her it was recommended that she go back. Pt did ask if her symptoms could be related to sugar. States she has not been checked for diabetes. Do you want to add any test for sugar for when she goes for lipid.

## 2020-05-28 NOTE — Telephone Encounter (Signed)
Patient notified and stated she will have fast labs completed this week and schedule follow up office visit in 3 weeks with Dr Nicki Reaper.

## 2020-05-28 NOTE — Telephone Encounter (Signed)
I had fasting glucose with the lipid 81 mg aspirin daily Follow-up office visit within 3  weeks after doing lab work thank you  Certainly if patient desires not to do neurology consult currently that it is understood

## 2020-05-30 LAB — LIPID PANEL
Chol/HDL Ratio: 3.7 ratio (ref 0.0–4.4)
Cholesterol, Total: 168 mg/dL (ref 100–199)
HDL: 46 mg/dL (ref >39)
LDL Chol Calc (NIH): 103 mg/dL — ABNORMAL HIGH (ref 0–99)
Triglycerides: 106 mg/dL (ref 0–149)
VLDL Cholesterol Cal: 19 mg/dL (ref 5–40)

## 2020-05-30 LAB — GLUCOSE, RANDOM: Glucose: 108 mg/dL — ABNORMAL HIGH (ref 65–99)

## 2020-06-20 ENCOUNTER — Other Ambulatory Visit: Payer: Self-pay | Admitting: Family Medicine

## 2020-07-30 ENCOUNTER — Other Ambulatory Visit: Payer: Self-pay

## 2020-07-30 ENCOUNTER — Encounter: Payer: Self-pay | Admitting: Family Medicine

## 2020-07-30 ENCOUNTER — Ambulatory Visit: Payer: Medicare HMO | Admitting: Family Medicine

## 2020-07-30 VITALS — BP 120/60 | HR 66 | Temp 97.5°F | Ht 62.0 in | Wt 120.0 lb

## 2020-07-30 DIAGNOSIS — R002 Palpitations: Secondary | ICD-10-CM | POA: Diagnosis not present

## 2020-07-30 DIAGNOSIS — R0789 Other chest pain: Secondary | ICD-10-CM

## 2020-07-30 DIAGNOSIS — R0600 Dyspnea, unspecified: Secondary | ICD-10-CM

## 2020-07-30 DIAGNOSIS — R0609 Other forms of dyspnea: Secondary | ICD-10-CM

## 2020-07-30 DIAGNOSIS — R5383 Other fatigue: Secondary | ICD-10-CM

## 2020-07-30 DIAGNOSIS — R1084 Generalized abdominal pain: Secondary | ICD-10-CM

## 2020-07-30 DIAGNOSIS — E038 Other specified hypothyroidism: Secondary | ICD-10-CM

## 2020-07-30 NOTE — Progress Notes (Signed)
Patient ID: Grace [name], female    DOB: Sep 30, 1945, 75 y.o.   MRN: [id number]   Chief Complaint  Patient presents with  . cough and chest tightness    Fatigue   . knot on R temple     X 2 weeks    Subjective:  CC: cough, chest tightness, fatigue, feeling of "knot" on side of temple on right  This is a new problem.  Presents today with a complaint of cough, chest tightness, fatigue, "knot "on the right side of her temple.  Reports the knot on the right side of her temple has been present for 2 weeks.  Recalls that her vision changed once she saw yellow looking at the wall, this resolved quickly.  Reports that she has a headache, this is not constant, has felt weak and lightheadedness and dizziness.  Denies fever, chills.  Endorses fatigue, weight loss after the loss of a dear friend, cough, chest tightness, shortness of breath, heart palpitations, abdominal pain, cold intolerance, polyuria, dizziness and lightheadedness.  Chronic conditions include hypothyroidism, hyperlipidemia, history of TIA, and history of angina.    Medical History Grace [name] has a past medical history of Cancer [facility]), Diastolic dysfunction (43/32/9518), Dry eyes, bilateral, Family history of anesthesia complication, Frequency of urination, Heart murmur, Hyperlipidemia, Hypothyroid, Migraine (2013), Mitral valve prolapse, Stroke ([place]), and TIA (transient ischemic attack) (02/19/2012).   Outpatient Encounter Medications as of 07/30/2020  Medication Sig  . ALPRAZolam (XANAX) 0.25 MG tablet Take 1/2-1 tablet po BID prn anxiety. CAUTION:DROWSINESS  . Calcium Carb-Cholecalciferol (CALCIUM+D3) 600-800 MG-UNIT TABS Take 1 tablet by mouth daily.  . calcium carbonate (OS-CAL - DOSED IN MG OF ELEMENTAL CALCIUM) 1250 (500 Ca) MG tablet Take 1 tablet (500 mg of elemental calcium total) by mouth 2 (two) times daily with a meal.  . Cholecalciferol (VITAMIN D3) 50 MCG (2000 UT) capsule Take 2,000 Units by mouth daily.   Marland Kitchen ezetimibe  (ZETIA) 10 MG tablet TAKE ONE TABLET BY MOUTH ONCE DAILY.  Marland Kitchen levothyroxine (SYNTHROID) 75 MCG tablet TAKE 1 TABLET TUESDAY TO   SUNDAY AND 1/2 TABLET ON   MONDAY (Patient taking differently: Take 37.5-75 mcg by mouth See admin instructions. Take 75 mcg Tuesday -Sunday and 37.5 mcg on MONDAY)  . metoCLOPramide (REGLAN) 10 MG tablet Take 1 tablet (10 mg total) by mouth 3 (three) times daily before meals.  . Multiple Vitamins-Minerals (ALIVE WOMENS 50+ PO) Take 1 tablet by mouth daily.  . ondansetron (ZOFRAN ODT) 4 MG disintegrating tablet Take 1 tablet (4 mg total) by mouth every 8 (eight) hours as needed for nausea or vomiting.  . vitamin B-12 (CYANOCOBALAMIN) 1000 MCG tablet Take 1,000 mcg by mouth daily.  . zoledronic acid (RECLAST) 5 MG/100ML SOLN injection Inject 5 mg into the vein once. Yearly   No facility-administered encounter medications on file as of 07/30/2020.     Review of Systems  Constitutional: Positive for fatigue and unexpected weight change. Negative for chills and fever.       Lost weight after losing a dear friend, has put on weight recently. Sees Dr. Ananias Pilgrim   Respiratory: Positive for cough, chest tightness and shortness of breath.        Not constant, random cough.  Shortness of breath with vacuuming and gets dizziness.  Chest tightness with deep breath in center of chest.  Cardiovascular: Positive for palpitations. Negative for leg swelling.       Occasional   Gastrointestinal: Positive for abdominal pain. Negative for blood  in stool.       Pain in center of abdomen- sees Dr. Corbin Ade.   Endocrine: Positive for cold intolerance and polyuria. Negative for polydipsia and polyphagia.  Genitourinary: Negative for dysuria.  Neurological: Positive for dizziness, weakness, light-headedness and headaches.  Psychiatric/Behavioral: Negative for self-injury and suicidal ideas.       Lost a dear friend in January. PHQ-9: 8      Vitals BP 120/60   Pulse 66   Temp (!) 97.5 F  (36.4 C)   Ht 5\' 2"  (1.575 m)   Wt 120 lb (54.4 kg)   SpO2 99%   BMI 21.95 kg/m   Objective:   Physical Exam Vitals and nursing note reviewed.  Constitutional:      Appearance: Normal appearance.  HENT:     Head:     Comments: Reports area on side of right temple that feels like  "knot". No erythema, no warmth, slight difference in palpation when compared to left side. "softer" Cardiovascular:     Rate and Rhythm: Normal rate and regular rhythm.     Heart sounds: Normal heart sounds.  Pulmonary:     Effort: Pulmonary effort is normal.     Breath sounds: Normal breath sounds.  Abdominal:     General: Bowel sounds are normal.     Tenderness: There is abdominal tenderness in the epigastric area.  Skin:    General: Skin is warm and dry.  Neurological:     General: No focal deficit present.     Mental Status: She is alert.  Psychiatric:        Behavior: Behavior normal.      Assessment and Plan   1. Fatigue, unspecified type - TSH - CBC with Differential - Comprehensive Metabolic Panel (CMET) - HgB A1c - EKG 12-Lead  2. Feeling of chest tightness - Comprehensive Metabolic Panel (CMET) - EKG 12-Lead  3. Dyspnea on exertion - EKG 12-Lead  4. Heart palpitations - TSH - EKG 12-Lead  5. Generalized abdominal pain - CBC with Differential - Amylase - Lipase  6. Other specified hypothyroidism - TSH   Multiple system complaints today, will get EKG to rule out acute cardiac findings, will get lab work for  epigastric abdominal pain, fatigue, heart palpitations, dyspnea on exertion and feeling of chest tightness.  Vital signs stable, blood pressure well controlled in the office today.  Able to converse throughout visit without obvious shortness of breath.  EKG without obvious ischemia, compared to old EKG in 2019- very similar, reviewed with Dr. Sallee Lange while patient in office.   Agrees with plan of care discussed today. Understands warning signs to seek  further care: chest pain, shortness of breath, any significant change in health.  Understands to follow-up to discuss possible memory issues, further follow-up will be determined after results of lab work become available.  Understands these are broad complaints and may take time to investigate and determine appropriate next steps. Information given at discharge about grief and loss.      Pecolia Ades, NP 07/30/2020

## 2020-07-30 NOTE — Patient Instructions (Signed)
Managing Loss, Adult People experience loss in many different ways throughout their lives. Events such as moving, changing jobs, and losing friends can create a sense of loss. The loss may be as serious as a major health change, divorce, death of a pet, or death of a loved one. All of these types of loss are likely to create a physical and emotional reaction known as grief. Grief is the result of a major change or an absence of something or someone that you count on. Grief is a normal reaction to loss. A variety of factors can affect your grieving experience, including:  The nature of your loss.  Your relationship to what or whom you lost.  Your understanding of grief and how to manage it.  Your support system. How to manage lifestyle changes Keep to your normal routine as much as possible.  If you have trouble focusing or doing normal activities, it is acceptable to take some time away from your normal routine.  Spend time with friends and loved ones.  Eat a healthy diet, get plenty of sleep, and rest when you feel tired.   How to recognize changes  The way that you deal with your grief will affect your ability to function as you normally do. When grieving, you may experience these changes:  Numbness, shock, sadness, anxiety, anger, denial, and guilt.  Thoughts about death.  Unexpected crying.  A physical sensation of emptiness in your stomach.  Problems sleeping and eating.  Tiredness (fatigue).  Loss of interest in normal activities.  Dreaming about or imagining seeing the person who died.  A need to remember what or whom you lost.  Difficulty thinking about anything other than your loss for a period of time.  Relief. If you have been expecting the loss for a while, you may feel a sense of relief when it happens. Follow these instructions at home: Activity Express your feelings in healthy ways, such as:  Talking with others about your loss. It may be helpful to find  others who have had a similar loss, such as a support group.  Writing down your feelings in a journal.  Doing physical activities to release stress and emotional energy.  Doing creative activities like painting, sculpting, or playing or listening to music.  Practicing resilience. This is the ability to recover and adjust after facing challenges. Reading some resources that encourage resilience may help you to learn ways to practice those behaviors.   General instructions  Be patient with yourself and others. Allow the grieving process to happen, and remember that grieving takes time. ? It is likely that you may never feel completely done with some grief. You may find a way to move on while still cherishing memories and feelings about your loss. ? Accepting your loss is a process. It can take months or longer to adjust.  Keep all follow-up visits as told by your health care provider. This is important. Where to find support To get support for managing loss:  Ask your health care provider for help and recommendations, such as grief counseling or therapy.  Think about joining a support group for people who are managing a loss. Where to find more information You can find more information about managing loss from:  American Society of Clinical Oncology: www.cancer.net  American Psychological Association: www.apa.org Contact a health care provider if:  Your grief is extreme and keeps getting worse.  You have ongoing grief that does not improve.  Your body shows symptoms   of grief, such as illness.  You feel depressed, anxious, or lonely. Get help right away if:  You have thoughts about hurting yourself or others. If you ever feel like you may hurt yourself or others, or have thoughts about taking your own life, get help right away. You can go to your nearest emergency department or call:  Your local emergency services (911 in the [place]  A suicide crisis helpline, such as the  National Suicide Prevention Lifeline at 1-800-273-8255. This is open 24 hours a day. Summary  Grief is the result of a major change or an absence of someone or something that you count on. Grief is a normal reaction to loss.  The depth of grief and the period of recovery depend on the type of loss and your ability to adjust to the change and process your feelings.  Processing grief requires patience and a willingness to accept your feelings and talk about your loss with people who are supportive.  It is important to find resources that work for you and to realize that people experience grief differently. There is not one grieving process that works for everyone in the same way.  Be aware that when grief becomes extreme, it can lead to more severe issues like isolation, depression, anxiety, or suicidal thoughts. Talk with your health care provider if you have any of these issues. This information is not intended to replace advice given to you by your health care provider. Make sure you discuss any questions you have with your health care provider. Document Revised: 09/08/2019 Document Reviewed: 09/08/2019 Elsevier Patient Education  2021 Elsevier Inc.  

## 2020-07-31 LAB — COMPREHENSIVE METABOLIC PANEL
ALT: 13 IU/L (ref 0–32)
AST: 25 IU/L (ref 0–40)
Albumin/Globulin Ratio: 1.7 (ref 1.2–2.2)
Albumin: 4.5 g/dL (ref 3.7–4.7)
Alkaline Phosphatase: 66 IU/L (ref 44–121)
BUN/Creatinine Ratio: 11 — ABNORMAL LOW (ref 12–28)
BUN: 8 mg/dL (ref 8–27)
Bilirubin Total: 0.9 mg/dL (ref 0.0–1.2)
CO2: 25 mmol/L (ref 20–29)
Calcium: 9.3 mg/dL (ref 8.7–10.3)
Chloride: 97 mmol/L (ref 96–106)
Creatinine, Ser: 0.73 mg/dL (ref 0.57–1.00)
Globulin, Total: 2.6 g/dL (ref 1.5–4.5)
Glucose: 95 mg/dL (ref 65–99)
Potassium: 4.2 mmol/L (ref 3.5–5.2)
Sodium: 137 mmol/L (ref 134–144)
Total Protein: 7.1 g/dL (ref 6.0–8.5)
eGFR: 87 mL/min/{1.73_m2} (ref >59)

## 2020-07-31 LAB — CBC WITH DIFFERENTIAL/PLATELET
Basophils Absolute: 0.1 10*3/uL (ref 0.0–0.2)
Basos: 1 %
EOS (ABSOLUTE): 0.1 10*3/uL (ref 0.0–0.4)
Eos: 1 %
Hematocrit: 41.7 % (ref 34.0–46.6)
Hemoglobin: 14.1 g/dL (ref 11.1–15.9)
Immature Grans (Abs): 0 10*3/uL (ref 0.0–0.1)
Immature Granulocytes: 0 %
Lymphocytes Absolute: 1.8 10*3/uL (ref 0.7–3.1)
Lymphs: 27 %
MCH: 31.2 pg (ref 26.6–33.0)
MCHC: 33.8 g/dL (ref 31.5–35.7)
MCV: 92 fL (ref 79–97)
Monocytes Absolute: 0.7 10*3/uL (ref 0.1–0.9)
Monocytes: 10 %
Neutrophils Absolute: 4.2 10*3/uL (ref 1.4–7.0)
Neutrophils: 61 %
Platelets: 232 10*3/uL (ref 150–450)
RBC: 4.52 x10E6/uL (ref 3.77–5.28)
RDW: 11.8 % (ref 11.7–15.4)
WBC: 6.8 10*3/uL (ref 3.4–10.8)

## 2020-07-31 LAB — HEMOGLOBIN A1C
Est. average glucose Bld gHb Est-mCnc: 105 mg/dL
Hgb A1c MFr Bld: 5.3 % (ref 4.8–5.6)

## 2020-07-31 LAB — LIPASE: Lipase: 22 U/L (ref 14–85)

## 2020-07-31 LAB — TSH: TSH: 2.92 u[IU]/mL (ref 0.450–4.500)

## 2020-07-31 LAB — AMYLASE: Amylase: 45 U/L (ref 31–110)

## 2020-08-16 ENCOUNTER — Ambulatory Visit: Payer: Medicare HMO | Admitting: Family Medicine

## 2020-09-18 DIAGNOSIS — R94120 Abnormal auditory function study: Secondary | ICD-10-CM | POA: Insufficient documentation

## 2020-10-02 ENCOUNTER — Other Ambulatory Visit: Payer: Self-pay | Admitting: Family Medicine

## 2020-10-10 ENCOUNTER — Telehealth: Payer: Self-pay | Admitting: Family Medicine

## 2020-10-10 NOTE — Telephone Encounter (Signed)
Attempting to fill our form for [place] [employer] to see if this needs PA. Pt is no longer active per insurance. States no longer active 03/30/20, reference ID [id number]. Left message to return call to get insurance info. Form at nurses station.

## 2020-10-11 ENCOUNTER — Telehealth: Payer: Self-pay | Admitting: Family Medicine

## 2020-10-11 DIAGNOSIS — R413 Other amnesia: Secondary | ICD-10-CM

## 2020-10-11 NOTE — Telephone Encounter (Signed)
Patient states she can't remember as good as she used to and wants to catch it early if it is something so she would like to pursue referral to neurology and is aware in may take several months to get an appointment.

## 2020-10-11 NOTE — Telephone Encounter (Signed)
I am not aware of any particular blood test that checks for dementia per se that is standard.  There are genetic test that a person can do to look for certain genes that can indicate whether or not they are increased risk but if that test comes back positive then it does not necessarily mean they are going to get dementia it just means they are at increased risk.  It is not often that people ask for that test because it is not a highly specific test.  Are there additional worries or concerns about something going on with Grace [name] currently that is prompting this?  (Please see if there are additional concerns questions issues regarding this if necessary we can even set up an appointment with neurology or with [place])

## 2020-10-11 NOTE — Telephone Encounter (Signed)
Pt is calling in voiced that she has a family history of dementia and is wanting to know if she can have blood work done to tell weather or not if she is going to have it. Pt would like a call back if this is possible

## 2020-10-11 NOTE — Telephone Encounter (Signed)
I would recommend referral to neurology either [facility] or [facility] thank you

## 2020-10-11 NOTE — Telephone Encounter (Signed)
Patient notified that need copy of updated insurance card to check prior approval for infusion.

## 2020-10-12 NOTE — Telephone Encounter (Signed)
Referral ordered in Epic. Patient aware

## 2020-10-17 ENCOUNTER — Telehealth (INDEPENDENT_AMBULATORY_CARE_PROVIDER_SITE_OTHER): Payer: Self-pay | Admitting: Internal Medicine

## 2020-10-17 NOTE — Telephone Encounter (Signed)
Patient left voice mail message stating she is having constipation - medication is no longer working - please advise - ph# [phone]

## 2020-10-17 NOTE — Telephone Encounter (Signed)
Patient is scheduled to see Dr.Castaneda 10/18/2020 at 11 am.

## 2020-10-18 ENCOUNTER — Other Ambulatory Visit: Payer: Self-pay

## 2020-10-18 ENCOUNTER — Encounter (INDEPENDENT_AMBULATORY_CARE_PROVIDER_SITE_OTHER): Payer: Self-pay | Admitting: Gastroenterology

## 2020-10-18 ENCOUNTER — Ambulatory Visit (INDEPENDENT_AMBULATORY_CARE_PROVIDER_SITE_OTHER): Payer: Medicare HMO | Admitting: Gastroenterology

## 2020-10-18 DIAGNOSIS — K3184 Gastroparesis: Secondary | ICD-10-CM | POA: Insufficient documentation

## 2020-10-18 DIAGNOSIS — K5904 Chronic idiopathic constipation: Secondary | ICD-10-CM | POA: Diagnosis not present

## 2020-10-18 DIAGNOSIS — K5900 Constipation, unspecified: Secondary | ICD-10-CM | POA: Insufficient documentation

## 2020-10-18 NOTE — Patient Instructions (Addendum)
Follow dietary recommendations for gastroparesis Can try domperidone 10 mg three times a day Continue with Miralax 1 spoonful and docusate 1 capsule every day . Can increase Miralax to half capful if worsening constipation or abdominal pain

## 2020-10-18 NOTE — Telephone Encounter (Signed)
Pt brought insurance card by; placed in folder for injections with her paper

## 2020-10-18 NOTE — Progress Notes (Signed)
Maylon Peppers, M.D. Gastroenterology & Hepatology [facility] [address] [place], [place] [address]  Primary Care Physician: Kathyrn Drown, MD [place] [id number]  I will communicate my assessment and recommendations to the referring MD via EMR.  Problems: Gastroparesis Chronic constipation  History of Present Illness: Grace [name] is a 75 y.o. female with past medical history of chronic constipation, gastroparesis, heart failure with diastolic dysfunction, hyperlipidemia, hypothyroidism, stroke,, mitral prolapse, who presents for follow up of gastroparesis and constipation.  The patient was last seen on 01/30/2020. At that time, the patient was scheduled to undergo an EGD as she did not have too much response to Reglan, findings are described below.  Patient reports that in 03/23/2020 she had COVID and reports that her chronic problems with her gastrointestinal system got worse since then. She reports that these symptoms have been going on "for a while". She reports that she is concerned as she never feels hungry and when she tries to eat she feels full very quick. Thinks she has lost 8 lb in the last 7 months (based on our records has lost 6 lb since last visit). Has been drinking 3-4 protein shakes a week. Tries eating three times a day, but has tried to eat some snacks through the day occasionally as she cannot large meals during the day. She tries to eat ice cream and other food that may be better tolerated. She feels nauseated frequently, but no pain in her abdomen.Has a prescription for Zofran but does not take it.  The patient was on Reglan 5 mg 3 times daily in the past for gastroparesis, she took it for close to 3 months but did not feel any improvement so she stopped taking it.  Regarding her bowel movement frequency, she states that she has a history of constipation for many years.  Due to this   she is currently taking 1 spoonful of Miralax and docusate 1 capsule every day. With this she is able to move her bowels daily but very scant amount of stool comes out. These stools are very loose.  States that she could not tolerate taking 1 full capful of MiraLAX as it caused significant amount of bowel movements  The patient denies having any vomiting, fever, chills, hematochezia, melena, hematemesis, abdominal pain, diarrhea, jaundice, pruritus.  Gastric emptying study: 11/2019  12% emptied at 1 hr ( normal >= 10%) 17% emptied at 2 hr ( normal >= 40%) 21% emptied at 3 hr ( normal >= 70%) 25% emptied at 4 hr ( normal >= 90%) IMPRESSION: Significantly delayed gastric emptying study.  Last EGD: 01/2020 The hypopharynx was normal. Findings: The examined esophagus was normal. The Z-line was irregular and was found 36 cm from the incisors. No endoscopic abnormality was evident in the esophagus to explain the patient's complaint of dysphagia. It was decided, however, to proceed with dilation of the entire esophagus. The scope was withdrawn. Dilation was performed with a Maloney dilator with mild resistance at 60 Fr. The dilation site was examined following endoscope reinsertion and showed no change and no bleeding, mucosal tear or perforation. A few small sessile polyps were found in the gastric body. Diffuse mild inflammation characterized by congestion (edema), erythema and friability was found in the gastric body and in the gastric antrum. Biopsies were taken with a cold forceps for histology. The pylorus was normal. The duodenal bulb and second portion of the duodenum were normal.  SURGICAL PATHOLOGY  A. STOMACH, ANTRUM, BIOPSY:  - Mild reactive gastropathy.  - Warthin-Starry special stain is negative for Helicobacter pylori.   B. STOMACH, BODY, BIOPSY:  - Chronic gastritis and focal intestinal metaplasia, negative for  dysplasia.  - Helicobacter pylori immunostain is negative for  Helicobacter pylori.   Last Colonoscopy: 2017 The perianal and digital rectal examinations were normal. Findings: Three sessile polyps were found in the transverse colon, ascending colon and cecum. The polyps were small in size. These were biopsied with a cold forceps for histology. A 4 mm polyp was found in the sigmoid colon. The polyp was sessile. The polyp was removed with a cold snare.  Path - TUBULAR ADENOMA(X2). - HIGH GRADE DYSPLASIA IS NOT IDENTIFIED. Patient advised to repeat in 5 years.  CT abdomen in 02/2020: WNL.  Past Medical History: Past Medical History:  Diagnosis Date   Cancer ([place])    Skin Cancer, Squamous Cell   Diastolic dysfunction 35/36/1443   Ejection fraction 75%.   Dry eyes, bilateral    Family history of anesthesia complication    Mother was hard to wake up after anesthesia   Frequency of urination    Heart murmur    Hyperlipidemia    Diet controlled   Hypothyroid    Migraine 2013   Mitral valve prolapse    Stroke ([place])    3 episodes in past yr   TIA (transient ischemic attack) 02/19/2012    Past Surgical History: Past Surgical History:  Procedure Laterality Date   BALLOON DILATION N/A 07/28/2013   Procedure: BALLOON DILATION;  Surgeon: Rogene Houston, MD;  [facility];  Service: Endoscopy;  Laterality: N/A;   BIOPSY  02/16/2020   Procedure: BIOPSY;  Surgeon: Rogene Houston, MD;  [facility];  Service: Endoscopy;;   BREAST BIOPSY     left benign tumor   CATARACT EXTRACTION W/PHACO Right 07/11/2013   Procedure: CATARACT EXTRACTION PHACO AND INTRAOCULAR LENS PLACEMENT ([place]);  Surgeon: Tonny Branch, MD;  Location: [facility];  Service: Ophthalmology;  Laterality: Right;  CDE 9.99   CATARACT EXTRACTION W/PHACO Left 08/04/2013   Procedure: CATARACT EXTRACTION PHACO AND INTRAOCULAR LENS PLACEMENT ([facility]);  Surgeon: Tonny Branch, MD;  Location: [facility];  Service: Ophthalmology;  Laterality: Left;  CDE:16.26   CHOLECYSTECTOMY      COLONOSCOPY  12/18/2010   Procedure: COLONOSCOPY;  Surgeon: Rogene Houston, MD;  [facility];  Service: Endoscopy;  Laterality: N/A;  1:00 pm   COLONOSCOPY N/A 03/06/2016   Procedure: COLONOSCOPY;  Surgeon: Rogene Houston, MD;  [facility];  Service: Endoscopy;  Laterality: N/A;  1200   ESOPHAGEAL DILATION N/A 01/27/2018   Procedure: ESOPHAGEAL DILATION;  Surgeon: Rogene Houston, MD;  [facility];  Service: Endoscopy;  Laterality: N/A;   ESOPHAGEAL DILATION N/A 02/16/2020   Procedure: ESOPHAGEAL DILATION;  Surgeon: Rogene Houston, MD;  [facility];  Service: Endoscopy;  Laterality: N/A;   ESOPHAGOGASTRODUODENOSCOPY     ESOPHAGOGASTRODUODENOSCOPY N/A 07/28/2013   Procedure: ESOPHAGOGASTRODUODENOSCOPY (EGD);  Surgeon: Rogene Houston, MD;  [facility];  Service: Endoscopy;  Laterality: N/A;  125   ESOPHAGOGASTRODUODENOSCOPY N/A 01/27/2018   Procedure: ESOPHAGOGASTRODUODENOSCOPY (EGD);  Surgeon: Rogene Houston, MD;  [facility];  Service: Endoscopy;  Laterality: N/A;  2:00-moved to 10/30 @ 2:45pm per Ann   ESOPHAGOGASTRODUODENOSCOPY N/A 02/16/2020   Procedure: ESOPHAGOGASTRODUODENOSCOPY (EGD);  Surgeon: Rogene Houston, MD;  [facility];  Service: Endoscopy;  Laterality: N/A;  10:00   LEFT HEART CATH AND CORONARY ANGIOGRAPHY N/A 07/17/2017   Procedure: LEFT HEART CATH AND CORONARY ANGIOGRAPHY;  Surgeon: Sherren Mocha, MD;  Location: [facility];  Service: Cardiovascular;  Laterality: N/A;   MALONEY DILATION N/A 07/28/2013   Procedure: Venia Minks DILATION;  Surgeon: Rogene Houston, MD;  [facility];  Service: Endoscopy;  Laterality: N/A;   SAVORY DILATION N/A 07/28/2013   Procedure: SAVORY DILATION;  Surgeon: Rogene Houston, MD;  [facility];  Service: Endoscopy;  Laterality: N/A;   THYROIDECTOMY  11/20/2011   Procedure: THYROIDECTOMY;  Surgeon: Izora Gala, MD;  Location: [facility];   Service: ENT;  Laterality: Left;  LEFT THYROID LOBECTOMY   TUBAL LIGATION     39 yrs ago.    Family History: Family History  Problem Relation Age of Onset   Heart disease Mother    Hypertension Mother    Stroke Mother    Thyroid disease Mother    Stroke Maternal Grandmother    Cancer Paternal Grandmother        breast   Colon cancer Neg Hx     Social History: Social History   Tobacco Use  Smoking Status Never  Smokeless Tobacco Never   Social History   Substance and Sexual Activity  Alcohol Use No   Alcohol/week: 0.0 standard drinks   Social History   Substance and Sexual Activity  Drug Use No    Allergies: Allergies  Allergen Reactions   Compazine Other (See Comments)    Slurred speech   Statins Other (See Comments)    myalgias   Sulfa Drugs Cross Reactors Rash    Medications: Current Outpatient Medications  Medication Sig Dispense Refill   ALPRAZolam (XANAX) 0.25 MG tablet Take 1/2-1 tablet po BID prn anxiety. CAUTION:DROWSINESS 20 tablet 0   Calcium Carb-Cholecalciferol (CALCIUM+D3) 600-800 MG-UNIT TABS Take 1 tablet by mouth daily.     calcium carbonate (OS-CAL - DOSED IN MG OF ELEMENTAL CALCIUM) 1250 (500 Ca) MG tablet Take 1 tablet (500 mg of elemental calcium total) by mouth 2 (two) times daily with a meal. 180 tablet 3   Cholecalciferol (VITAMIN D3) 50 MCG (2000 UT) capsule Take 2,000 Units by mouth daily.      ezetimibe (ZETIA) 10 MG tablet TAKE ONE TABLET BY MOUTH ONCE DAILY. 90 tablet 1   levothyroxine (SYNTHROID) 75 MCG tablet TAKE 1 TABLET TUESDAY TO   SUNDAY AND 1/2 TABLET ON   MONDAY (Patient taking differently: Take 37.5-75 mcg by mouth See admin instructions. Take 75 mcg Tuesday -Sunday and 37.5 mcg on MONDAY) 88 tablet 3   Multiple Vitamins-Minerals (ALIVE WOMENS 50+ PO) Take 1 tablet by mouth daily.     OVER THE COUNTER MEDICATION Colace miralax     vitamin B-12 (CYANOCOBALAMIN) 1000 MCG tablet Take 1,000 mcg by mouth daily.      ondansetron (ZOFRAN ODT) 4 MG disintegrating tablet Take 1 tablet (4 mg total) by mouth every 8 (eight) hours as needed for nausea or vomiting. (Patient not taking: Reported on 10/18/2020) 24 tablet 0   zoledronic acid (RECLAST) 5 MG/100ML SOLN injection Inject 5 mg into the vein once. Yearly (Patient not taking: Reported on 10/18/2020)     No current facility-administered medications for this visit.    Review of Systems: GENERAL: negative for malaise, night sweats HEENT: No changes in hearing or vision, no nose bleeds or other nasal problems. NECK: Negative for  lumps, goiter, pain and significant neck swelling RESPIRATORY: Negative for cough, wheezing CARDIOVASCULAR: Negative for chest pain, leg swelling, palpitations, orthopnea GI: SEE HPI MUSCULOSKELETAL: Negative for joint pain or swelling, back pain, and muscle pain. SKIN: Negative for lesions, rash PSYCH: Negative for sleep disturbance, mood disorder and recent psychosocial stressors. HEMATOLOGY Negative for prolonged bleeding, bruising easily, and swollen nodes. ENDOCRINE: Negative for cold or heat intolerance, polyuria, polydipsia and goiter. NEURO: negative for tremor, gait imbalance, syncope and seizures. The remainder of the review of systems is noncontributory.   Physical Exam: BP 138/73 (BP Location: Right Arm, Patient Position: Sitting, Cuff Size: Normal)   Pulse 85   Temp 99.6 F (37.6 C)   Ht 5\' 2"  (1.575 m)   Wt 119 lb 1.6 oz (54 kg)   BMI 21.78 kg/m  GENERAL: The patient is AO x3, in no acute distress. HEENT: Head is normocephalic and atraumatic. EOMI are intact. Mouth is well hydrated and without lesions. NECK: Supple. No masses LUNGS: Clear to auscultation. No presence of rhonchi/wheezing/rales. Adequate chest expansion HEART: RRR, normal s1 and s2. ABDOMEN: Soft, nontender but discomfort upon palpation of the epigastric area, no guarding, no peritoneal signs, and nondistended. BS +. No masses. EXTREMITIES:  Without any cyanosis, clubbing, rash, lesions or edema. NEUROLOGIC: AOx3, no focal motor deficit. SKIN: no jaundice, no rashes  Imaging/Labs: as above  I personally reviewed and interpreted the available labs, imaging and endoscopic files.  Impression and Plan: Grace [name] is a 75 y.o. female with past medical history of chronic constipation, gastroparesis, heart failure with diastolic dysfunction, hyperlipidemia, hypothyroidism, stroke,, mitral prolapse, who presents for follow up of gastroparesis and constipation.  I had a thorough discussion with the patient regarding her current condition (gastroparesis) which is likely causing her current symptoms.  She had endoscopic investigations that have been negative for any other organic pathology, as well as a CT of the abdomen pelvis with IV contrast in December 2021 that were unremarkable.  I explained the pathophysiology and possible treatments available for her condition.  I emphasized the importance of taking frequent small meals during the day to improve her symptoms while maintaining an adequate weight.  As she did not have any response to the use of Reglan, I suggested the use of off label domperidone.  I provided information on how to get this medication from [place] at a dose of 10 mg 3 times daily.  We discussed the potential side effects of this medication and benefits.  The patient will think about starting this medication and [place].  In terms of her constipation, these symptoms go along with her gastrointestinal dysmotility.  I advised her to continue with her current bowel regimen but if she were to have worsening constipation she can increase the amount of MiraLAX to half capful every day.  The patient understood and agreed.  -Follow dietary recommendations for gastroparesis -Can try domperidone 10 mg three times a day -Continue with Miralax 1 spoonful and docusate 1 capsule every day . Can  increase Miralax to half capful if worsening constipation or abdominal pain -Repeat colonoscopy in 02/2021 - polyp surveillance  All questions were answered.      Harvel Quale, MD Gastroenterology and Hepatology [facility]

## 2020-11-05 NOTE — Telephone Encounter (Signed)
Patient stated she has been taking reclast for 5 years and was told she couldn't take it over 5 years. Patient has appt with endocrinology 11/27/20 to discuss other treatment options for osteoporosis

## 2020-11-12 ENCOUNTER — Telehealth: Payer: Self-pay | Admitting: Family Medicine

## 2020-11-12 ENCOUNTER — Other Ambulatory Visit: Payer: Self-pay

## 2020-11-12 MED ORDER — LEVOTHYROXINE SODIUM 75 MCG PO TABS: ORAL_TABLET | ORAL | 1 refills | Status: DC

## 2020-11-12 NOTE — Telephone Encounter (Signed)
Patient is requesting refill on levothyroxine 75 mg to be called into [facility] last filled 11/09/19

## 2020-11-12 NOTE — Telephone Encounter (Signed)
Medication has been sent in. Left detailed message on answering machine (per DPR)

## 2020-11-12 NOTE — Telephone Encounter (Signed)
Nurses it looks like this was already sent in please check medication list

## 2020-11-19 ENCOUNTER — Telehealth: Payer: Self-pay

## 2020-11-19 DIAGNOSIS — M816 Localized osteoporosis [Lequesne]: Secondary | ICD-10-CM

## 2020-11-19 NOTE — Telephone Encounter (Signed)
Patient needs her lab order updated for [facility]

## 2020-11-19 NOTE — Telephone Encounter (Signed)
done

## 2020-11-20 LAB — COMPREHENSIVE METABOLIC PANEL
ALT: 16 IU/L (ref 0–32)
AST: 21 IU/L (ref 0–40)
Albumin/Globulin Ratio: 1.7 (ref 1.2–2.2)
Albumin: 4.5 g/dL (ref 3.7–4.7)
Alkaline Phosphatase: 64 IU/L (ref 44–121)
BUN/Creatinine Ratio: 12 (ref 12–28)
BUN: 10 mg/dL (ref 8–27)
Bilirubin Total: 0.7 mg/dL (ref 0.0–1.2)
CO2: 26 mmol/L (ref 20–29)
Calcium: 9.4 mg/dL (ref 8.7–10.3)
Chloride: 95 mmol/L — ABNORMAL LOW (ref 96–106)
Creatinine, Ser: 0.82 mg/dL (ref 0.57–1.00)
Globulin, Total: 2.6 g/dL (ref 1.5–4.5)
Glucose: 111 mg/dL — ABNORMAL HIGH (ref 65–99)
Potassium: 4.3 mmol/L (ref 3.5–5.2)
Sodium: 134 mmol/L (ref 134–144)
Total Protein: 7.1 g/dL (ref 6.0–8.5)
eGFR: 75 mL/min/{1.73_m2} (ref >59)

## 2020-11-20 LAB — PTH, INTACT AND CALCIUM: PTH: 24 pg/mL (ref 15–65)

## 2020-11-27 ENCOUNTER — Encounter: Payer: Self-pay | Admitting: "Endocrinology

## 2020-11-27 ENCOUNTER — Other Ambulatory Visit: Payer: Self-pay

## 2020-11-27 ENCOUNTER — Ambulatory Visit (INDEPENDENT_AMBULATORY_CARE_PROVIDER_SITE_OTHER): Payer: Medicare HMO | Admitting: "Endocrinology

## 2020-11-27 VITALS — BP 144/76 | HR 72 | Ht 62.0 in | Wt 118.2 lb

## 2020-11-27 DIAGNOSIS — M816 Localized osteoporosis [Lequesne]: Secondary | ICD-10-CM

## 2020-11-27 DIAGNOSIS — E038 Other specified hypothyroidism: Secondary | ICD-10-CM | POA: Diagnosis not present

## 2020-11-27 NOTE — Progress Notes (Signed)
11/27/2020   Endocrinology follow-up note   Past Medical History:  Diagnosis Date   Cancer [facility])    Skin Cancer, Squamous Cell   Diastolic dysfunction 123456   Ejection fraction 75%.   Dry eyes, bilateral    Family history of anesthesia complication    Mother was hard to wake up after anesthesia   Frequency of urination    Heart murmur    Hyperlipidemia    Diet controlled   Hypothyroid    Migraine 2013   Mitral valve prolapse    Stroke ([place])    3 episodes in past yr   TIA (transient ischemic attack) 02/19/2012   Past Surgical History:  Procedure Laterality Date   BALLOON DILATION N/A 07/28/2013   Procedure: BALLOON DILATION;  Surgeon: Rogene Houston, MD;  [facility];  Service: Endoscopy;  Laterality: N/A;   BIOPSY  02/16/2020   Procedure: BIOPSY;  Surgeon: Rogene Houston, MD;  [facility];  Service: Endoscopy;;   BREAST BIOPSY     left benign tumor   CATARACT EXTRACTION W/PHACO Right 07/11/2013   Procedure: CATARACT EXTRACTION PHACO AND INTRAOCULAR LENS PLACEMENT ([place]);  Surgeon: Tonny Branch, MD;  Location: [facility];  Service: Ophthalmology;  Laterality: Right;  CDE 9.99   CATARACT EXTRACTION W/PHACO Left 08/04/2013   Procedure: CATARACT EXTRACTION PHACO AND INTRAOCULAR LENS PLACEMENT ([facility]);  Surgeon: Tonny Branch, MD;  Location: [facility];  Service: Ophthalmology;  Laterality: Left;  CDE:16.26   CHOLECYSTECTOMY     COLONOSCOPY  12/18/2010   Procedure: COLONOSCOPY;  Surgeon: Rogene Houston, MD;  [facility];  Service: Endoscopy;  Laterality: N/A;  1:00 pm   COLONOSCOPY N/A 03/06/2016   Procedure: COLONOSCOPY;  Surgeon: Rogene Houston, MD;  [facility];  Service: Endoscopy;  Laterality: N/A;  1200   ESOPHAGEAL DILATION N/A 01/27/2018   Procedure: ESOPHAGEAL DILATION;  Surgeon: Rogene Houston, MD;  [facility];  Service: Endoscopy;  Laterality: N/A;    ESOPHAGEAL DILATION N/A 02/16/2020   Procedure: ESOPHAGEAL DILATION;  Surgeon: Rogene Houston, MD;  [facility];  Service: Endoscopy;  Laterality: N/A;   ESOPHAGOGASTRODUODENOSCOPY     ESOPHAGOGASTRODUODENOSCOPY N/A 07/28/2013   Procedure: ESOPHAGOGASTRODUODENOSCOPY (EGD);  Surgeon: Rogene Houston, MD;  [facility];  Service: Endoscopy;  Laterality: N/A;  125   ESOPHAGOGASTRODUODENOSCOPY N/A 01/27/2018   Procedure: ESOPHAGOGASTRODUODENOSCOPY (EGD);  Surgeon: Rogene Houston, MD;  [facility];  Service: Endoscopy;  Laterality: N/A;  2:00-moved to 10/30 @ 2:45pm per Ann   ESOPHAGOGASTRODUODENOSCOPY N/A 02/16/2020   Procedure: ESOPHAGOGASTRODUODENOSCOPY (EGD);  Surgeon: Rogene Houston, MD;  [facility];  Service: Endoscopy;  Laterality: N/A;  10:00   LEFT HEART CATH AND CORONARY ANGIOGRAPHY N/A 07/17/2017   Procedure: LEFT HEART CATH AND CORONARY ANGIOGRAPHY;  Surgeon: Sherren Mocha, MD;  Location: [place] [facility];  Service: Cardiovascular;  Laterality: N/A;   MALONEY DILATION N/A 07/28/2013   Procedure: Venia Minks DILATION;  Surgeon: Rogene Houston, MD;  [facility];  Service: Endoscopy;  Laterality: N/A;   SAVORY DILATION N/A 07/28/2013  Procedure: SAVORY DILATION;  Surgeon: Rogene Houston, MD;  [facility];  Service: Endoscopy;  Laterality: N/A;   THYROIDECTOMY  11/20/2011   Procedure: THYROIDECTOMY;  Surgeon: Izora Gala, MD;  Location: [place];  Service: ENT;  Laterality: Left;  LEFT THYROID LOBECTOMY   TUBAL LIGATION     39 yrs ago.   Social History   Socioeconomic History   Marital status: Widowed    Spouse name: Not on file   Number of children: Not on file   Years of education: Not on file   Highest education level: Not on file  Occupational History   Not on file  Tobacco Use   Smoking status: Never   Smokeless tobacco: Never  Vaping Use   Vaping Use: Never used  Substance and Sexual Activity    Alcohol use: No    Alcohol/week: 0.0 standard drinks   Drug use: No   Sexual activity: Not Currently  Other Topics Concern   Not on file  Social History Narrative   caffeine- one soda daily, coffee, 1/2 cup   2 children   High school   Social Determinants of Health   Financial Resource Strain: Not on file  Food Insecurity: Not on file  Transportation Needs: Not on file  Physical Activity: Not on file  Stress: Not on file  Social Connections: Not on file   Outpatient Encounter Medications as of 11/27/2020  Medication Sig   ALPRAZolam (XANAX) 0.25 MG tablet Take 1/2-1 tablet po BID prn anxiety. CAUTION:DROWSINESS   Calcium Carb-Cholecalciferol (CALCIUM+D3) 600-800 MG-UNIT TABS Take 1 tablet by mouth daily.   calcium carbonate (OS-CAL - DOSED IN MG OF ELEMENTAL CALCIUM) 1250 (500 Ca) MG tablet Take 1 tablet (500 mg of elemental calcium total) by mouth 2 (two) times daily with a meal. (Patient not taking: Reported on 11/27/2020)   Cholecalciferol (VITAMIN D3) 50 MCG (2000 UT) capsule Take 2,000 Units by mouth daily.    ezetimibe (ZETIA) 10 MG tablet TAKE ONE TABLET BY MOUTH ONCE DAILY.   levothyroxine (SYNTHROID) 75 MCG tablet TAKE 1 TABLET TUESDAY TO   SUNDAY AND 1/2 TABLET ON   MONDAY   Multiple Vitamins-Minerals (ALIVE WOMENS 50+ PO) Take 1 tablet by mouth daily.   ondansetron (ZOFRAN ODT) 4 MG disintegrating tablet Take 1 tablet (4 mg total) by mouth every 8 (eight) hours as needed for nausea or vomiting. (Patient not taking: Reported on 10/18/2020)   OVER THE COUNTER MEDICATION Colace miralax   vitamin B-12 (CYANOCOBALAMIN) 1000 MCG tablet Take 1,000 mcg by mouth daily.   zoledronic acid (RECLAST) 5 MG/100ML SOLN injection Inject 5 mg into the vein once. Yearly (Patient not taking: Reported on 10/18/2020)   No facility-administered encounter medications on file as of 11/27/2020.   ALLERGIES: Allergies  Allergen Reactions   Compazine Other (See Comments)    Slurred speech    Statins Other (See Comments)    myalgias   Sulfa Drugs Cross Reactors Rash    VACCINATION STATUS: Immunization History  Administered Date(s) Administered   Fluad Quad(high Dose 65+) 12/19/2019   Influenza Split 01/25/2013   Influenza, High Dose Seasonal PF 01/20/2019   Influenza,inj,Quad PF,6+ Mos 12/29/2013, 01/15/2015, 01/07/2016, 12/23/2016, 01/15/2018   Influenza-Unspecified 12/30/2011   Moderna Sars-Covid-2 Vaccination 05/26/2019, 06/24/2019   Pneumococcal Conjugate-13 01/12/2014   Pneumococcal Polysaccharide-23 12/30/2011, 04/20/2018   Zoster, Live 04/17/2011     HPI   Grace [name] is 75 y.o. female who presents today with a medical history as above.  she is being seen in follow-up after she was seen in consultation for osteoporosis requested by Kathyrn Drown, MD. See notes from previous visit. Patient was diagnosed with osteoporosis  approximately  7 years ago. She denies fractures or falls. No dizziness/vertigo/orthostasis.  I reviewed pt's DEXA scans: From 2014-20 21, 4 different imaging studies.  She revises her history today and reports that she has taken IV Reclast for 5 years, last treatment was in September 2021.    No h/o vitamin D deficiency, is on vitamin D supplement with cholecalciferol 1000 units daily. -No reported height loss.  Pt is on calcium, most recent labs show normal renal function, normal calcium at 9.4.  she also eats dairy and green, leafy, vegetables.   She stays active, walking, dancing, no weight bearing exercises.  No h/o hyper/hypocalcemia. No h/o hyperparathyroidism. No h/o kidney stones.  -She has hypothyroidism currently on stable dose of levothyroxine 75 mcg p.o. daily before breakfast.  She is accompanied by her friend to this visit today.  Her major concern seems to be anxiety, unintended weight loss of 7 pounds since last visit.  She is grieving the loss of her friend in January. Menopause in her late 66s.   Pt does not have a FH  of osteoporosis.   Review of Systems   Objective:    BP (!) 144/76   Pulse 72   Ht '5\' 2"'$  (1.575 m)   Wt 118 lb 3.2 oz (53.6 kg)   BMI 21.62 kg/m   Wt Readings from Last 3 Encounters:  11/27/20 118 lb 3.2 oz (53.6 kg)  10/18/20 119 lb 1.6 oz (54 kg)  07/30/20 120 lb (54.4 kg)    Physical Exam  Constitutional: + BMI of 21.6 , anxious state of mind.  Not in acute distress. Eyes: PERRLA, EOMI, no exophthalmos   CMP ( most recent) CMP     Component Value Date/Time   NA 134 11/19/2020 1519   K 4.3 11/19/2020 1519   CL 95 (L) 11/19/2020 1519   CO2 26 11/19/2020 1519   GLUCOSE 111 (H) 11/19/2020 1519   GLUCOSE 126 (H) 03/20/2020 1217   BUN 10 11/19/2020 1519   CREATININE 0.82 11/19/2020 1519   CREATININE 0.68 01/20/2019 0946   CALCIUM 9.4 11/19/2020 1519   PROT 7.1 11/19/2020 1519   ALBUMIN 4.5 11/19/2020 1519   AST 21 11/19/2020 1519   ALT 16 11/19/2020 1519   ALKPHOS 64 11/19/2020 1519   BILITOT 0.7 11/19/2020 1519   GFRNONAA >60 03/20/2020 1217   GFRNONAA 87 01/20/2019 0946   GFRAA >60 08/24/2019 1604   GFRAA 101 01/20/2019 0946     Diabetic Labs (most recent): Lab Results  Component Value Date   HGBA1C 5.3 07/30/2020   HGBA1C 5.0 02/10/2017   HGBA1C 5.1 02/20/2012     Lipid Panel ( most recent) Lipid Panel     Component Value Date/Time   CHOL 168 05/29/2020 0850   TRIG 106 05/29/2020 0850   HDL 46 05/29/2020 0850   CHOLHDL 3.7 05/29/2020 0850   CHOLHDL 3.9 08/24/2019 1604   VLDL 35 08/24/2019 1604   LDLCALC 103 (H) 05/29/2020 0850   LABVLDL 19 05/29/2020 0850      Lab Results  Component Value Date   TSH 2.920 07/30/2020   TSH 1.140 02/20/2020   TSH 3.35 12/12/2019   TSH 1.890 11/17/2018   TSH 2.070 12/04/2017   TSH 2.192 02/09/2017   TSH 1.780 12/23/2016   TSH 3.690 08/07/2016  TSH 2.600 10/09/2015   TSH 1.380 05/01/2015   FREET4 1.39 02/20/2020   FREET4 1.30 11/17/2018   FREET4 1.25 08/07/2016   FREET4 1.26 05/05/2013   FREET4  1.12 07/05/2012     Reviewed her labs for DEXA reports.  AP Spine L1-L4 (L2,L3) 12/16/2019 72.9 Osteopenia -2.4 0.881 g/cm2 1.6% - AP Spine L1-L4 (L2,L3) 12/09/2017 70.9 Osteoporosis -2.5 0.867 g/cm2 -4.8% - AP Spine L1-L4 (L2,L3) 03/21/2015 68.1 Osteopenia -2.1 0.911 g/cm2 -2.8% - AP Spine L1-L4 (L2,L3) 11/23/2012 65.8 Osteopenia -1.9 0.937 g/cm2 - -   DualFemur Neck Right 12/16/2019 72.9 Osteoporosis -2.8 0.652 g/cm2 -2.4% - DualFemur Neck Right 12/09/2017 70.9 Osteoporosis -2.7 0.668 g/cm2 -8.7% Yes DualFemur Neck Right 03/21/2015 68.1 Osteopenia -2.2 0.732 g/cm2 6.6% Yes DualFemur Neck Right 11/23/2012 65.8 Osteoporosis -2.5 0.687 g/cm2 - -   DualFemur Total Mean 12/16/2019 72.9 Osteoporosis -2.6 0.681 g/cm2 -3.0% - DualFemur Total Mean 12/09/2017 70.9 Osteopenia -2.4 0.702 g/cm2 -3.4% Yes DualFemur Total Mean 03/21/2015 68.1 Osteopenia -2.2 0.727 g/cm2 2.0% - DualFemur Total Mean 11/23/2012 65.8 Osteopenia -2.3 0.713 g/cm2 - -   Left Forearm Radius 33% 12/16/2019 72.9 Osteopenia -1.9 0.579 g/cm2  Assessment: 1. Osteoporosis  Plan: 1. Osteoporosis - likely postmenopausal , on stable treatment with IV Reclast with clinical response. -I reviewed her available DEXA reports from 2021 and her recent lab works. Her treatment history states that she took Reclast for 5 years.  Her last infusion was in September 2021.  She wishes to explore her next options.  I discussed about increased risk of fracture.  She is a candidate for Prolia subcutaneous treatment. She is dealing with some GI discomfort and currently anxious.  She will see her GI specialist in the next several days.  She will return in 2 weeks for subcutaneous Prolia injection.  I had a long discussion with her about the caution with Prolia treatment, the fact that Prolia will continue for long-term without interruption.    -she reports some undiagnosed GI complaints.  She is not a candidate for oral  bisphosphonates. -I believe she is being adequately treated with IV Reclast and will be continued on the same.    Her labs did not reveal any parathyroid dysfunction.  Her thyroid function tests are consistent with appropriate replacement.  She is advised to continue levothyroxine 75 mcg p.o. daily before breakfast.  Her next DEXA is in September 2023.   - we reviewed her dietary and supplemental calcium and vitamin D intake. -I discussed fall precautions .  Patient is currently grieving the loss of her friend.  She is currently on Xanax 0.25 mg as needed.   I did not initiate any new prescription for her today.  - I advised patient to maintain close follow up with Kathyrn Drown, MD for primary care needs.   I spent 25 minutes in the care of the patient today including review of labs from Thyroid Function, CMP, bone density, and other relevant labs ; imaging/biopsy records (current and previous including abstractions from other facilities); face-to-face time discussing  her lab results and symptoms, medications doses, her options of short and long term treatment based on the latest standards of care / guidelines;   and documenting the encounter.  [name]  participated in the discussions, expressed understanding, and voiced agreement with the above plans.  All questions were answered to her satisfaction. she is encouraged to contact clinic should she have any questions or concerns prior to her return visit.   Follow up  plan: Return in about 2 weeks (around 12/11/2020) for Prolia During NV, F/U with no Labs.   Glade Lloyd, MD [facility] [address] [place], [place] [address] Phone: [phone]  Fax: [phone]     11/27/2020, 12:20 PM  This note was partially dictated with voice recognition software. Similar sounding words can be transcribed inadequately or may not  be corrected upon review.

## 2020-11-29 ENCOUNTER — Telehealth: Payer: Self-pay

## 2020-11-29 ENCOUNTER — Telehealth: Payer: Self-pay | Admitting: "Endocrinology

## 2020-11-29 ENCOUNTER — Telehealth: Payer: Self-pay | Admitting: Family Medicine

## 2020-11-29 NOTE — Telephone Encounter (Signed)
Patient would like to know if she needs bone density test done before she has injection. Left on answer machine. Please advise

## 2020-11-29 NOTE — Telephone Encounter (Signed)
Patient advised per Dr Nicki Reaper: Dr Nicki Reaper would encourage that she touch base with Dr. Liliane Channel office regarding this   There are some guidelines that state that even when doing treatment doing the bone density every 2 years would be reasonable (Which she would not be due until September 17 which is a few days after Dr. Liliane Channel visit-that is why Dr Nicki Reaper would recommend talking with Dr. Liliane Channel office first)   There are other guidelines that state anybody who has osteoporosis should go ahead and be treated over the course of several years without having to do follow-up bone densities every 2 years   So Dr Nicki Reaper believes it would be wise to get Dr. Liliane Channel input  Patient verbalized understanding and stated she will discuss with Dr Liliane Channel office

## 2020-11-29 NOTE — Telephone Encounter (Signed)
Discussed with pt regarding her bone density study will not need to be repeated until September of 2023. Also discussed prolia, possible side effects. Understanding voiced.

## 2020-11-29 NOTE — Telephone Encounter (Signed)
I would encourage that she touch base with Dr. Liliane Channel office regarding this  There are some guidelines that state that even when doing treatment doing the bone density every 2 years would be reasonable (Which she would not be due until September 17 which is a few days after Dr. Liliane Channel visit-that is why I would recommend talking with Dr. Liliane Channel office first)  There are other guidelines that state anybody who has osteoporosis should go ahead and be treated over the course of several years without having to do follow-up bone densities every 2 years  So I believe it would be wise to get Dr. Liliane Channel input

## 2020-11-29 NOTE — Telephone Encounter (Signed)
Prolia '60mg'$ /ML authorized by [place] starting date 11/29/2020 through 11/29/2021. Authorization # [id number].

## 2020-11-29 NOTE — Telephone Encounter (Signed)
Patient states she comes in 2 weeks for a prolia and wants to know if she needs a bone density done beforehand, her last one was about a year ago. Also wants to know if she has complications with the prolia is it an injection she can stop at any time. Requesting call back.

## 2020-12-11 ENCOUNTER — Ambulatory Visit: Payer: Medicare HMO | Admitting: Diagnostic Neuroimaging

## 2020-12-11 ENCOUNTER — Encounter: Payer: Self-pay | Admitting: Diagnostic Neuroimaging

## 2020-12-11 VITALS — BP 139/70 | HR 64 | Ht 62.0 in | Wt 119.8 lb

## 2020-12-11 DIAGNOSIS — G459 Transient cerebral ischemic attack, unspecified: Secondary | ICD-10-CM | POA: Diagnosis not present

## 2020-12-11 DIAGNOSIS — R413 Other amnesia: Secondary | ICD-10-CM

## 2020-12-11 NOTE — Patient Instructions (Signed)
  MEMORY LOSS ([facility]) - likely related to anxiety, depression, GI issues; no changes in ADLs - consider psychology /psychiatry evaluation  TIA - continue aspirin '81mg'$  and zetia - follow up with cardiac monitor testing - monitor migraine HA and sxs for now (infrequent for now; treat conservatively)

## 2020-12-11 NOTE — Progress Notes (Signed)
[facility]  PATIENT: Grace [name] DOB: 1946/03/05  REFERRING CLINICIAN: Lance Sell, MD HISTORY FROM: patient  REASON FOR VISIT: new consult    HISTORICAL  CHIEF COMPLAINT:  Chief Complaint  Patient presents with   Memory changes    Rm 6, internal referral,  friendZella Ball  MMSE 23  "having headaches but not often, take Tylenol 500 mg, feel like I'm not here sometimes- in a dream- all this since Covid 03/23/20"     HISTORY OF PRESENT ILLNESS:   UPDATE (12/11/20, VRP): Since last visit, more anxiety, memory loss, TIA events, decreased energy. Higher anxiety state, depression, fatigue. Also her close friend died in April 19, 2020 from covid / afib.  PRIOR HPI (03/17/17): 75 year old female here for evaluation of abnormal spells.  Patient has history of hypercholesterolemia.  November 2013 patient had admission for possible TIA.  She had blurred vision, slurred speech, word finding difficulties.  Stroke workup was completed.  She was found to have left ICA intracranial stenosis on MRA, but follow-up cerebral angiogram showed no evidence of stenosis, and therefore was felt to be artifactual.  Patient was treated medically.  Patient was evaluated at [facility] neurology in June 2014, for abnormal visual changes, speech difficulty and headaches.  She was diagnosed with migraine aura without headache.  In 2018 patient had recurrence of symptoms.  She went to the hospital in November 2018 for blurred vision, slurred speech, word finding difficulties.  Symptoms lasted for 10 minutes.  Then she had a headache bilaterally which lasted for approximately 10 minutes.  She had a milder episode following that.  She was admitted to the hospital for TIA evaluation.  She was found to have similar left ICA cavernous carotid narrowing on MRA, in the area of atherosclerotic calcification on CT.  Stroke workup was completed.  Patient was treated medically.  Since that time patient is doing well.  No  further recurrence of symptoms.   REVIEW OF SYSTEMS: Full 14 system review of systems performed and negative with exception of: as per HPI.    ALLERGIES: Allergies  Allergen Reactions   Compazine Other (See Comments)    Slurred speech   Statins Other (See Comments)    myalgias   Sulfa Drugs Cross Reactors Rash    HOME MEDICATIONS: Outpatient Medications Prior to Visit  Medication Sig Dispense Refill   ALPRAZolam (XANAX) 0.25 MG tablet Take 1/2-1 tablet po BID prn anxiety. CAUTION:DROWSINESS 20 tablet 0   Calcium Carb-Cholecalciferol (CALCIUM+D3) 600-800 MG-UNIT TABS Take 1 tablet by mouth daily.     calcium carbonate (OS-CAL - DOSED IN MG OF ELEMENTAL CALCIUM) 1250 (500 Ca) MG tablet Take 1 tablet (500 mg of elemental calcium total) by mouth 2 (two) times daily with a meal. 180 tablet 3   Cholecalciferol (VITAMIN D3) 50 MCG (2000 UT) capsule Take 2,000 Units by mouth daily.      ezetimibe (ZETIA) 10 MG tablet TAKE ONE TABLET BY MOUTH ONCE DAILY. 90 tablet 1   levothyroxine (SYNTHROID) 75 MCG tablet TAKE 1 TABLET TUESDAY TO   SUNDAY AND 1/2 TABLET ON   MONDAY 88 tablet 1   Multiple Vitamins-Minerals (ALIVE WOMENS 50+ PO) Take 1 tablet by mouth daily.     ondansetron (ZOFRAN ODT) 4 MG disintegrating tablet Take 1 tablet (4 mg total) by mouth every 8 (eight) hours as needed for nausea or vomiting. 24 tablet 0   OVER THE COUNTER MEDICATION Colace miralax     vitamin B-12 (CYANOCOBALAMIN) 1000 MCG  tablet Take 1,000 mcg by mouth daily.     zoledronic acid (RECLAST) 5 MG/100ML SOLN injection Inject 5 mg into the vein once. Yearly (Patient not taking: No sig reported)     No facility-administered medications prior to visit.    PAST MEDICAL HISTORY: Past Medical History:  Diagnosis Date   Cancer ([place])    Skin Cancer, Squamous Cell   Diastolic dysfunction 123456   Ejection fraction 75%.   Dry eyes, bilateral    Family history of anesthesia complication    Mother was hard to wake  up after anesthesia   Frequency of urination    Heart murmur    Hyperlipidemia    Diet controlled   Hypothyroid    Migraine 2013   Mitral valve prolapse    Stroke ([place])    3 episodes in past yr   TIA (transient ischemic attack) 02/19/2012    PAST SURGICAL HISTORY: Past Surgical History:  Procedure Laterality Date   BALLOON DILATION N/A 07/28/2013   Procedure: BALLOON DILATION;  Surgeon: Rogene Houston, MD;  [facility];  Service: Endoscopy;  Laterality: N/A;   BIOPSY  02/16/2020   Procedure: BIOPSY;  Surgeon: Rogene Houston, MD;  [facility];  Service: Endoscopy;;   BREAST BIOPSY     left benign tumor   CATARACT EXTRACTION W/PHACO Right 07/11/2013   Procedure: CATARACT EXTRACTION PHACO AND INTRAOCULAR LENS PLACEMENT ([place]);  Surgeon: Tonny Branch, MD;  Location: [facility];  Service: Ophthalmology;  Laterality: Right;  CDE 9.99   CATARACT EXTRACTION W/PHACO Left 08/04/2013   Procedure: CATARACT EXTRACTION PHACO AND INTRAOCULAR LENS PLACEMENT (IOC);  Surgeon: Tonny Branch, MD;  Location: [facility];  Service: Ophthalmology;  Laterality: Left;  CDE:16.26   CHOLECYSTECTOMY     COLONOSCOPY  12/18/2010   Procedure: COLONOSCOPY;  Surgeon: Rogene Houston, MD;  [facility];  Service: Endoscopy;  Laterality: N/A;  1:00 pm   COLONOSCOPY N/A 03/06/2016   Procedure: COLONOSCOPY;  Surgeon: Rogene Houston, MD;  [facility];  Service: Endoscopy;  Laterality: N/A;  1200   ESOPHAGEAL DILATION N/A 01/27/2018   Procedure: ESOPHAGEAL DILATION;  Surgeon: Rogene Houston, MD;  [facility];  Service: Endoscopy;  Laterality: N/A;   ESOPHAGEAL DILATION N/A 02/16/2020   Procedure: ESOPHAGEAL DILATION;  Surgeon: Rogene Houston, MD;  [facility];  Service: Endoscopy;  Laterality: N/A;   ESOPHAGOGASTRODUODENOSCOPY     ESOPHAGOGASTRODUODENOSCOPY N/A 07/28/2013   Procedure: ESOPHAGOGASTRODUODENOSCOPY (EGD);  Surgeon: Rogene Houston, MD;  [facility];  Service: Endoscopy;  Laterality: N/A;  125   ESOPHAGOGASTRODUODENOSCOPY N/A 01/27/2018   Procedure: ESOPHAGOGASTRODUODENOSCOPY (EGD);  Surgeon: Rogene Houston, MD;  [facility];  Service: Endoscopy;  Laterality: N/A;  2:00-moved to 10/30 @ 2:45pm per Ann   ESOPHAGOGASTRODUODENOSCOPY N/A 02/16/2020   Procedure: ESOPHAGOGASTRODUODENOSCOPY (EGD);  Surgeon: Rogene Houston, MD;  [facility];  Service: Endoscopy;  Laterality: N/A;  10:00   LEFT HEART CATH AND CORONARY ANGIOGRAPHY N/A 07/17/2017   Procedure: LEFT HEART CATH AND CORONARY ANGIOGRAPHY;  Surgeon: Sherren Mocha, MD;  Location: [facility];  Service: Cardiovascular;  Laterality: N/A;   MALONEY DILATION N/A 07/28/2013   Procedure: Venia Minks DILATION;  Surgeon: Rogene Houston, MD;  [facility];  Service: Endoscopy;  Laterality: N/A;   SAVORY DILATION N/A 07/28/2013   Procedure: SAVORY DILATION;  Surgeon: Rogene Houston, MD;  [facility];  Service: Endoscopy;  Laterality: N/A;   THYROIDECTOMY  11/20/2011   Procedure: THYROIDECTOMY;  Surgeon: Izora Gala, MD;  Location: [facility];  Service: ENT;  Laterality: Left;  LEFT THYROID LOBECTOMY   TUBAL LIGATION     38 yrs ago.    FAMILY HISTORY: Family History  Problem Relation Age of Onset   Heart disease Mother    Hypertension Mother    Stroke Mother    Thyroid disease Mother    Dementia Mother    Stroke Maternal Grandmother    Cancer Paternal Grandmother        breast   Colon cancer Neg Hx     SOCIAL HISTORY:  Social History   Socioeconomic History   Marital status: Widowed    Spouse name: Not on file   Number of children: 2   Years of education: Not on file   Highest education level: High school graduate  Occupational History   Not on file  Tobacco Use   Smoking status: Never   Smokeless tobacco: Never  Vaping Use   Vaping Use: Never used  Substance and Sexual Activity   Alcohol use: No    Alcohol/week:  0.0 standard drinks   Drug use: No   Sexual activity: Not Currently  Other Topics Concern   Not on file  Social History Narrative   12/11/20 lives alone   caffeine- one soda daily, coffee, 1/2 cup   2 children   High school   Social Determinants of Health   Financial Resource Strain: Not on file  Food Insecurity: Not on file  Transportation Needs: Not on file  Physical Activity: Not on file  Stress: Not on file  Social Connections: Not on file  Intimate Partner Violence: Not on file     PHYSICAL EXAM  GENERAL EXAM/CONSTITUTIONAL: Vitals:  Vitals:   12/11/20 1151  BP: 139/70  Pulse: 64  Weight: 119 lb 12.8 oz (54.3 kg)  Height: '5\' 2"'$  (1.575 m)   Body mass index is 21.91 kg/m. No results found.  Patient is in no distress; well developed, nourished and groomed; neck is supple  CARDIOVASCULAR: Examination of carotid arteries is normal; no carotid bruits Regular rate and rhythm, no murmurs Examination of peripheral vascular system by observation and palpation is normal  EYES: Ophthalmoscopic exam of optic discs and posterior segments is normal; no papilledema or hemorrhages  MUSCULOSKELETAL: Gait, strength, tone, movements noted in Neurologic exam below  NEUROLOGIC: MENTAL STATUS:  MMSE - Mini Mental State Exam 12/11/2020  Orientation to time 5  Orientation to Place 5  Registration 3  Attention/ Calculation 2  Recall 1  Language- name 2 objects 2  Language- repeat 0  Language- follow 3 step command 3  Language- read & follow direction 1  Write a sentence 1  Copy design 0  Total score 23   awake, alert, oriented to person, place and time recent and remote memory intact normal attention and concentration language fluent, comprehension intact, naming intact,  fund of knowledge appropriate  CRANIAL NERVE:  2nd - no papilledema on fundoscopic exam 2nd, 3rd, 4th, 6th - pupils equal and reactive to light, visual fields full to confrontation, extraocular  muscles intact, no nystagmus 5th - facial sensation symmetric 7th - facial strength symmetric 8th - hearing intact 9th - palate elevates symmetrically, uvula midline 11th - shoulder shrug symmetric 12th - tongue protrusion midline  MOTOR:  normal bulk and tone, full strength in the BUE, BLE  SENSORY:  normal and  symmetric to light touch, pinprick, temperature, vibration  COORDINATION:  finger-nose-finger, fine finger movements normal  REFLEXES:  deep tendon reflexes present and symmetric  GAIT/STATION:  narrow based gait; able to walk tandem; romberg is negative    DIAGNOSTIC DATA (LABS, IMAGING, TESTING) - I reviewed patient records, labs, notes, testing and imaging myself where available.  Lab Results  Component Value Date   WBC 6.8 07/30/2020   HGB 14.1 07/30/2020   HCT 41.7 07/30/2020   MCV 92 07/30/2020   PLT 232 07/30/2020      Component Value Date/Time   NA 134 11/19/2020 1519   K 4.3 11/19/2020 1519   CL 95 (L) 11/19/2020 1519   CO2 26 11/19/2020 1519   GLUCOSE 111 (H) 11/19/2020 1519   GLUCOSE 126 (H) 03/20/2020 1217   BUN 10 11/19/2020 1519   CREATININE 0.82 11/19/2020 1519   CREATININE 0.68 01/20/2019 0946   CALCIUM 9.4 11/19/2020 1519   PROT 7.1 11/19/2020 1519   ALBUMIN 4.5 11/19/2020 1519   AST 21 11/19/2020 1519   ALT 16 11/19/2020 1519   ALKPHOS 64 11/19/2020 1519   BILITOT 0.7 11/19/2020 1519   GFRNONAA >60 03/20/2020 1217   GFRNONAA 87 01/20/2019 0946   GFRAA >60 08/24/2019 1604   GFRAA 101 01/20/2019 0946   Lab Results  Component Value Date   CHOL 168 05/29/2020   HDL 46 05/29/2020   LDLCALC 103 (H) 05/29/2020   TRIG 106 05/29/2020   CHOLHDL 3.7 05/29/2020   Lab Results  Component Value Date   HGBA1C 5.3 07/30/2020   Lab Results  Component Value Date   VITAMINB12 275 11/17/2018   Lab Results  Component Value Date   TSH 2.920 07/30/2020    02/20/12 MRI brain  - No acute or focal intracranial abnormality.  Minimal white  matter disease is stable from 2010.  02/20/12 MRA head  - 75-90% of stenosis left ICA cavernous/supraclinoid junction. - Mild intracranial atherosclerotic changes as described without proximal MCA lesion.  03/08/12 cerebral angiogram 1.  Angiographically no evidence of occlusions, stenosis, dissections, aneurysms or of arteriovenous shunting 2.  Venous outflow within normal limits.   02/09/17 MRI brain - No acute finding. Unremarkable study for age that is stable from 2013.   02/09/17 MRA head  1.  No acute finding or branch occlusion. 2. Mild to moderate narrowing at the anterior genu left cavernous carotid where there is atherosclerotic calcification by CT.  02/10/17 TTE - Left ventricle: The cavity size was normal. Wall thickness was   increased in a pattern of mild LVH. Systolic function was normal.   The estimated ejection fraction was in the range of 60% to 65%.   Wall motion was normal; there were no regional wall motion   abnormalities. Doppler parameters are consistent with abnormal   left ventricular relaxation (grade 1 diastolic dysfunction). - Aortic valve: There was trivial regurgitation. - Atrial septum: No defect or patent foramen ovale was identified.    ASSESSMENT AND PLAN  75 y.o. year old female here with recurrent episodes of blurred vision, wavy lines, floaters, slurred speech, trouble talking, headaches, likely representing migraine with aura.  TIA is also possibility although symptoms are more diffuse than one vascular distribution.  Agree with completed TIA/stroke workup.  No further neurologic testing advised from my standpoint.  Continue aspirin and Zetia.   Ddx: migraine with aura vs TIA  1. Memory loss   2. TIA (transient ischemic attack)      PLAN:  MEMORY  LOSS ([facility]) - likely related to anxiety, depression, GI issues; no changes in ADLs - consider psychology /psychiatry evaluation  TIA - continue aspirin '81mg'$  and zetia - follow up with  cardiac monitor testing - monitor migraine HA and sxs for now (infrequent for now; treat conservatively)  No follow-ups on file.    Penni Bombard, MD [id number], [id number] PM Certified in Neurology, Neurophysiology and Neuroimaging  [facility] [address] [place], [place] [address] [phone]

## 2020-12-12 ENCOUNTER — Other Ambulatory Visit: Payer: Self-pay

## 2020-12-12 ENCOUNTER — Encounter: Payer: Self-pay | Admitting: "Endocrinology

## 2020-12-12 ENCOUNTER — Ambulatory Visit: Payer: Medicare HMO | Admitting: "Endocrinology

## 2020-12-12 VITALS — BP 128/64 | HR 64 | Ht 62.0 in | Wt 119.8 lb

## 2020-12-12 DIAGNOSIS — M810 Age-related osteoporosis without current pathological fracture: Secondary | ICD-10-CM | POA: Diagnosis not present

## 2020-12-12 DIAGNOSIS — E038 Other specified hypothyroidism: Secondary | ICD-10-CM

## 2020-12-12 NOTE — Progress Notes (Signed)
12/12/2020   Endocrinology follow-up note   Past Medical History:  Diagnosis Date   Cancer [facility])    Skin Cancer, Squamous Cell   Diastolic dysfunction 123456   Ejection fraction 75%.   Dry eyes, bilateral    Family history of anesthesia complication    Mother was hard to wake up after anesthesia   Frequency of urination    Heart murmur    Hyperlipidemia    Diet controlled   Hypothyroid    Migraine 2013   Mitral valve prolapse    Stroke ([place])    3 episodes in past yr   TIA (transient ischemic attack) 02/19/2012   Past Surgical History:  Procedure Laterality Date   BALLOON DILATION N/A 07/28/2013   Procedure: BALLOON DILATION;  Surgeon: Rogene Houston, MD;  [facility];  Service: Endoscopy;  Laterality: N/A;   BIOPSY  02/16/2020   Procedure: BIOPSY;  Surgeon: Rogene Houston, MD;  [facility];  Service: Endoscopy;;   BREAST BIOPSY     left benign tumor   CATARACT EXTRACTION W/PHACO Right 07/11/2013   Procedure: CATARACT EXTRACTION PHACO AND INTRAOCULAR LENS PLACEMENT ([place]);  Surgeon: Tonny Branch, MD;  Location: [facility];  Service: Ophthalmology;  Laterality: Right;  CDE 9.99   CATARACT EXTRACTION W/PHACO Left 08/04/2013   Procedure: CATARACT EXTRACTION PHACO AND INTRAOCULAR LENS PLACEMENT ([facility]);  Surgeon: Tonny Branch, MD;  Location: [facility];  Service: Ophthalmology;  Laterality: Left;  CDE:16.26   CHOLECYSTECTOMY     COLONOSCOPY  12/18/2010   Procedure: COLONOSCOPY;  Surgeon: Rogene Houston, MD;  [facility];  Service: Endoscopy;  Laterality: N/A;  1:00 pm   COLONOSCOPY N/A 03/06/2016   Procedure: COLONOSCOPY;  Surgeon: Rogene Houston, MD;  [facility];  Service: Endoscopy;  Laterality: N/A;  1200   ESOPHAGEAL DILATION N/A 01/27/2018   Procedure: ESOPHAGEAL DILATION;  Surgeon: Rogene Houston, MD;  [facility];  Service: Endoscopy;  Laterality: N/A;    ESOPHAGEAL DILATION N/A 02/16/2020   Procedure: ESOPHAGEAL DILATION;  Surgeon: Rogene Houston, MD;  [facility];  Service: Endoscopy;  Laterality: N/A;   ESOPHAGOGASTRODUODENOSCOPY     ESOPHAGOGASTRODUODENOSCOPY N/A 07/28/2013   Procedure: ESOPHAGOGASTRODUODENOSCOPY (EGD);  Surgeon: Rogene Houston, MD;  [facility];  Service: Endoscopy;  Laterality: N/A;  125   ESOPHAGOGASTRODUODENOSCOPY N/A 01/27/2018   Procedure: ESOPHAGOGASTRODUODENOSCOPY (EGD);  Surgeon: Rogene Houston, MD;  [facility];  Service: Endoscopy;  Laterality: N/A;  2:00-moved to 10/30 @ 2:45pm per Ann   ESOPHAGOGASTRODUODENOSCOPY N/A 02/16/2020   Procedure: ESOPHAGOGASTRODUODENOSCOPY (EGD);  Surgeon: Rogene Houston, MD;  [facility];  Service: Endoscopy;  Laterality: N/A;  10:00   LEFT HEART CATH AND CORONARY ANGIOGRAPHY N/A 07/17/2017   Procedure: LEFT HEART CATH AND CORONARY ANGIOGRAPHY;  Surgeon: Sherren Mocha, MD;  Location: [facility];  Service: Cardiovascular;  Laterality: N/A;   MALONEY DILATION N/A 07/28/2013   Procedure: Venia Minks DILATION;  Surgeon: Rogene Houston, MD;  [facility];  Service: Endoscopy;  Laterality: N/A;   SAVORY DILATION N/A 07/28/2013  Procedure: SAVORY DILATION;  Surgeon: Rogene Houston, MD;  [facility];  Service: Endoscopy;  Laterality: N/A;   THYROIDECTOMY  11/20/2011   Procedure: THYROIDECTOMY;  Surgeon: Izora Gala, MD;  Location: [place];  Service: ENT;  Laterality: Left;  LEFT THYROID LOBECTOMY   TUBAL LIGATION     39 yrs ago.   Social History   Socioeconomic History   Marital status: Widowed    Spouse name: Not on file   Number of children: 2   Years of education: Not on file   Highest education level: High school graduate  Occupational History   Not on file  Tobacco Use   Smoking status: Never   Smokeless tobacco: Never  Vaping Use   Vaping Use: Never used  Substance and Sexual Activity    Alcohol use: No    Alcohol/week: 0.0 standard drinks   Drug use: No   Sexual activity: Not Currently  Other Topics Concern   Not on file  Social History Narrative   12/11/20 lives alone   caffeine- one soda daily, coffee, 1/2 cup   2 children   High school   Social Determinants of Health   Financial Resource Strain: Not on file  Food Insecurity: Not on file  Transportation Needs: Not on file  Physical Activity: Not on file  Stress: Not on file  Social Connections: Not on file   Outpatient Encounter Medications as of 12/12/2020  Medication Sig   ALPRAZolam (XANAX) 0.25 MG tablet Take 1/2-1 tablet po BID prn anxiety. CAUTION:DROWSINESS   Calcium Carb-Cholecalciferol (CALCIUM+D3) 600-800 MG-UNIT TABS Take 1 tablet by mouth 2 (two) times daily with a meal.   Cholecalciferol (VITAMIN D3) 50 MCG (2000 UT) capsule Take 2,000 Units by mouth daily.    ezetimibe (ZETIA) 10 MG tablet TAKE ONE TABLET BY MOUTH ONCE DAILY.   levothyroxine (SYNTHROID) 75 MCG tablet TAKE 1 TABLET TUESDAY TO   SUNDAY AND 1/2 TABLET ON   MONDAY   Multiple Vitamins-Minerals (ALIVE WOMENS 50+ PO) Take 1 tablet by mouth daily.   ondansetron (ZOFRAN ODT) 4 MG disintegrating tablet Take 1 tablet (4 mg total) by mouth every 8 (eight) hours as needed for nausea or vomiting.   OVER THE COUNTER MEDICATION Colace miralax   vitamin B-12 (CYANOCOBALAMIN) 1000 MCG tablet Take 1,000 mcg by mouth daily.   [DISCONTINUED] calcium carbonate (OS-CAL - DOSED IN MG OF ELEMENTAL CALCIUM) 1250 (500 Ca) MG tablet Take 1 tablet (500 mg of elemental calcium total) by mouth 2 (two) times daily with a meal.   [DISCONTINUED] zoledronic acid (RECLAST) 5 MG/100ML SOLN injection Inject 5 mg into the vein once. Yearly (Patient not taking: No sig reported)   No facility-administered encounter medications on file as of 12/12/2020.   ALLERGIES: Allergies  Allergen Reactions   Compazine Other (See Comments)    Slurred speech   Statins Other (See  Comments)    myalgias   Sulfa Drugs Cross Reactors Rash    VACCINATION STATUS: Immunization History  Administered Date(s) Administered   Fluad Quad(high Dose 65+) 12/19/2019   Influenza Split 01/25/2013   Influenza, High Dose Seasonal PF 01/20/2019   Influenza,inj,Quad PF,6+ Mos 12/29/2013, 01/15/2015, 01/07/2016, 12/23/2016, 01/15/2018   Influenza-Unspecified 12/30/2011   Moderna Sars-Covid-2 Vaccination 05/26/2019, 06/24/2019   Pneumococcal Conjugate-13 01/12/2014   Pneumococcal Polysaccharide-23 12/30/2011, 04/20/2018   Zoster, Live 04/17/2011     HPI   Grace [name] is 74 y.o. female who presents today with a medical history as above. she  is being seen in follow-up after she was seen in consultation for osteoporosis requested by Kathyrn Drown, MD. See notes from previous visit. Patient was diagnosed with osteoporosis  approximately  7 years ago. She denies fractures or falls. No dizziness/vertigo/orthostasis.  I reviewed pt's DEXA scans: From 2014-20 21, 4 different imaging studies.  She revises her history today and reports that she has taken IV Reclast for 5 years, last treatment was in September 2021.  She wants to explore her next options of treatment.  She is on ongoing vitamin D/calcium supplement.  -No reported height loss.  Her most recent labs show calcium at 9.4.  she also eats dairy and green, leafy, vegetables.   She stays active, walking, dancing, no weight bearing exercises.  No h/o hyper/hypocalcemia. No h/o hyperparathyroidism. No h/o kidney stones.  -She has hypothyroidism currently on stable dose of levothyroxine 75 mcg p.o. daily before breakfast.  She is accompanied by her friend to this visit today.  Her major concern seems to be anxiety, unintended weight loss of 7 pounds since last visit.  She is grieving the loss of her friend in January. Menopause in her late 26s.   Pt does not have a FH of osteoporosis. -She has left lower jaw soreness related  to her previous dental work, has not seen her dentist in some time.   Review of Systems   Objective:    BP 128/64   Pulse 64   Ht '5\' 2"'$  (1.575 m)   Wt 119 lb 12.8 oz (54.3 kg)   BMI 21.91 kg/m   Wt Readings from Last 3 Encounters:  12/12/20 119 lb 12.8 oz (54.3 kg)  12/11/20 119 lb 12.8 oz (54.3 kg)  11/27/20 118 lb 3.2 oz (53.6 kg)    Physical Exam  Constitutional: + BMI of 21.6 , anxious state of mind.  Not in acute distress. Eyes: PERRLA, EOMI, no exophthalmos   CMP ( most recent) CMP     Component Value Date/Time   NA 134 11/19/2020 1519   K 4.3 11/19/2020 1519   CL 95 (L) 11/19/2020 1519   CO2 26 11/19/2020 1519   GLUCOSE 111 (H) 11/19/2020 1519   GLUCOSE 126 (H) 03/20/2020 1217   BUN 10 11/19/2020 1519   CREATININE 0.82 11/19/2020 1519   CREATININE 0.68 01/20/2019 0946   CALCIUM 9.4 11/19/2020 1519   PROT 7.1 11/19/2020 1519   ALBUMIN 4.5 11/19/2020 1519   AST 21 11/19/2020 1519   ALT 16 11/19/2020 1519   ALKPHOS 64 11/19/2020 1519   BILITOT 0.7 11/19/2020 1519   GFRNONAA >60 03/20/2020 1217   GFRNONAA 87 01/20/2019 0946   GFRAA >60 08/24/2019 1604   GFRAA 101 01/20/2019 0946     Diabetic Labs (most recent): Lab Results  Component Value Date   HGBA1C 5.3 07/30/2020   HGBA1C 5.0 02/10/2017   HGBA1C 5.1 02/20/2012     Lipid Panel ( most recent) Lipid Panel     Component Value Date/Time   CHOL 168 05/29/2020 0850   TRIG 106 05/29/2020 0850   HDL 46 05/29/2020 0850   CHOLHDL 3.7 05/29/2020 0850   CHOLHDL 3.9 08/24/2019 1604   VLDL 35 08/24/2019 1604   LDLCALC 103 (H) 05/29/2020 0850   LABVLDL 19 05/29/2020 0850      Lab Results  Component Value Date   TSH 2.920 07/30/2020   TSH 1.140 02/20/2020   TSH 3.35 12/12/2019   TSH 1.890 11/17/2018   TSH 2.070 12/04/2017   TSH  2.192 02/09/2017   TSH 1.780 12/23/2016   TSH 3.690 08/07/2016   TSH 2.600 10/09/2015   TSH 1.380 05/01/2015   FREET4 1.39 02/20/2020   FREET4 1.30 11/17/2018    FREET4 1.25 08/07/2016   FREET4 1.26 05/05/2013   FREET4 1.12 07/05/2012     Reviewed her labs for DEXA reports.  AP Spine L1-L4 (L2,L3) 12/16/2019 72.9 Osteopenia -2.4 0.881 g/cm2 1.6% - AP Spine L1-L4 (L2,L3) 12/09/2017 70.9 Osteoporosis -2.5 0.867 g/cm2 -4.8% - AP Spine L1-L4 (L2,L3) 03/21/2015 68.1 Osteopenia -2.1 0.911 g/cm2 -2.8% - AP Spine L1-L4 (L2,L3) 11/23/2012 65.8 Osteopenia -1.9 0.937 g/cm2 - -   DualFemur Neck Right 12/16/2019 72.9 Osteoporosis -2.8 0.652 g/cm2 -2.4% - DualFemur Neck Right 12/09/2017 70.9 Osteoporosis -2.7 0.668 g/cm2 -8.7% Yes DualFemur Neck Right 03/21/2015 68.1 Osteopenia -2.2 0.732 g/cm2 6.6% Yes DualFemur Neck Right 11/23/2012 65.8 Osteoporosis -2.5 0.687 g/cm2 - -   DualFemur Total Mean 12/16/2019 72.9 Osteoporosis -2.6 0.681 g/cm2 -3.0% - DualFemur Total Mean 12/09/2017 70.9 Osteopenia -2.4 0.702 g/cm2 -3.4% Yes DualFemur Total Mean 03/21/2015 68.1 Osteopenia -2.2 0.727 g/cm2 2.0% - DualFemur Total Mean 11/23/2012 65.8 Osteopenia -2.3 0.713 g/cm2 - -   Left Forearm Radius 33% 12/16/2019 72.9 Osteopenia -1.9 0.579 g/cm2  Assessment: 1. Osteoporosis  Plan: 1. Osteoporosis - likely postmenopausal , on stable treatment with IV Reclast with clinical response. -I reviewed her available DEXA reports from 2021 and her recent lab works. Her treatment history states that she took Reclast for 5 years.  Her last infusion was in September 2021.  She wishes to explore her next options.  I discussed about increased risk of fracture.  She is a candidate for Prolia subcutaneous treatment. -However, she needs a checkup and clearance from her dentist before starting Prolia treatment in light of her reported soreness and gum recession and left lower jaw. -After clearance, she is an immediate candidate for Prolia injection.  She will return in 6 weeks to give her time for healing in case she needs dental procedure.  I had a long discussion with her  about the caution with Prolia treatment, the fact that Prolia will continue for long-term without interruption.   - we reviewed her dietary and supplemental calcium and vitamin D intake. -I discussed fall precautions .  Patient is currently grieving the loss of her friend.    Her labs did not reveal any parathyroid dysfunction.  Her thyroid function tests are consistent with appropriate replacement.  She is advised to continue levothyroxine 75 mcg p.o. daily before breakfast.  - We discussed about the correct intake of her thyroid hormone, on empty stomach at fasting, with water, separated by at least 30 minutes from breakfast and other medications,  and separated by more than 4 hours from calcium, iron, multivitamins, acid reflux medications (PPIs). -Patient is made aware of the fact that thyroid hormone replacement is needed for life, dose to be adjusted by periodic monitoring of thyroid function tests.   Her next DEXA is in September 2023.    I did not initiate any new prescriptions for her yet. - I advised patient to maintain close follow up with Kathyrn Drown, MD for primary care needs.    I spent 25 minutes in the care of the patient today including review of labs from Thyroid Function, CMP, and other relevant labs ; imaging/biopsy records (current and previous including abstractions from other facilities); face-to-face time discussing  her lab results and symptoms, medications doses, her options of short and long term  treatment based on the latest standards of care / guidelines;   and documenting the encounter.  [name]  participated in the discussions, expressed understanding, and voiced agreement with the above plans.  All questions were answered to her satisfaction. she is encouraged to contact clinic should she have any questions or concerns prior to her return visit.    Follow up plan: Return in about 6 weeks (around 01/23/2021) for F/U with no Labs.   Glade Lloyd,  MD [facility] [address] [place], [place] [address] Phone: [phone]  Fax: [phone]     12/12/2020, 2:57 PM  This note was partially dictated with voice recognition software. Similar sounding words can be transcribed inadequately or may not  be corrected upon review.

## 2021-01-15 ENCOUNTER — Ambulatory Visit (INDEPENDENT_AMBULATORY_CARE_PROVIDER_SITE_OTHER): Payer: Medicare HMO

## 2021-01-15 ENCOUNTER — Other Ambulatory Visit: Payer: Self-pay

## 2021-01-15 VITALS — BP 114/62 | HR 74 | Ht 62.0 in | Wt 121.0 lb

## 2021-01-15 DIAGNOSIS — Z0000 Encounter for general adult medical examination without abnormal findings: Secondary | ICD-10-CM

## 2021-01-15 DIAGNOSIS — Z1211 Encounter for screening for malignant neoplasm of colon: Secondary | ICD-10-CM

## 2021-01-15 DIAGNOSIS — Z23 Encounter for immunization: Secondary | ICD-10-CM | POA: Diagnosis not present

## 2021-01-15 DIAGNOSIS — Z1231 Encounter for screening mammogram for malignant neoplasm of breast: Secondary | ICD-10-CM | POA: Diagnosis not present

## 2021-01-15 NOTE — Patient Instructions (Signed)
Grace [name] , Thank you for taking time to come for your Medicare Wellness Visit. I appreciate your ongoing commitment to your health goals. Please review the following plan we discussed and let me know if I can assist you in the future.   Screening recommendations/referrals: Colonoscopy: Done 03/06/2016 Repeat in 5 years  Mammogram: Done 12/20/2019 Repeat annually  Bone Density: Done 12/16/2019 Repeat every 2 years  Recommended yearly ophthalmology/optometry visit for glaucoma screening and checkup Recommended yearly dental visit for hygiene and checkup  Vaccinations: Influenza vaccine: 01/15/2021 Pneumococcal vaccine: Done 01/12/2014 and 04/20/2018 Tdap vaccine: Due Repeat in 10 years  Shingles vaccine: Shingrix discussed. Please contact your pharmacy for coverage information.     Covid-19:Done 05/26/2019 and 06/24/2019  Advanced directives: Advance directive discussed with you today. Even though you declined this today, please call our office should you change your mind, and we can give you the proper paperwork for you to fill out.   Conditions/risks identified: Aim for 30 minutes of exercise or brisk walking each day, drink 6-8 glasses of water and eat lots of fruits and vegetables.   Next appointment: Follow up in one year for your annual wellness visit 2023.   Preventive Care 26 Years and Older, Female Preventive care refers to lifestyle choices and visits with your health care provider that can promote health and wellness. What does preventive care include? A yearly physical exam. This is also called an annual well check. Dental exams once or twice a year. Routine eye exams. Ask your health care provider how often you should have your eyes checked. Personal lifestyle choices, including: Daily care of your teeth and gums. Regular physical activity. Eating a healthy diet. Avoiding tobacco and drug use. Limiting alcohol use. Practicing safe sex. Taking low-dose aspirin every  day. Taking vitamin and mineral supplements as recommended by your health care provider. What happens during an annual well check? The services and screenings done by your health care provider during your annual well check will depend on your age, overall health, lifestyle risk factors, and family history of disease. Counseling  Your health care provider may ask you questions about your: Alcohol use. Tobacco use. Drug use. Emotional well-being. Home and relationship well-being. Sexual activity. Eating habits. History of falls. Memory and ability to understand (cognition). Work and work Statistician. Reproductive health. Screening  You may have the following tests or measurements: Height, weight, and BMI. Blood pressure. Lipid and cholesterol levels. These may be checked every 5 years, or more frequently if you are over 51 years old. Skin check. Lung cancer screening. You may have this screening every year starting at age 67 if you have a 30-pack-year history of smoking and currently smoke or have quit within the past 15 years. Fecal occult blood test (FOBT) of the stool. You may have this test every year starting at age 90. Flexible sigmoidoscopy or colonoscopy. You may have a sigmoidoscopy every 5 years or a colonoscopy every 10 years starting at age 47. Hepatitis C blood test. Hepatitis B blood test. Sexually transmitted disease (STD) testing. Diabetes screening. This is done by checking your blood sugar (glucose) after you have not eaten for a while (fasting). You may have this done every 1-3 years. Bone density scan. This is done to screen for osteoporosis. You may have this done starting at age 48. Mammogram. This may be done every 1-2 years. Talk to your health care provider about how often you should have regular mammograms. Talk with your health care provider  about your test results, treatment options, and if necessary, the need for more tests. Vaccines  Your health care  provider may recommend certain vaccines, such as: Influenza vaccine. This is recommended every year. Tetanus, diphtheria, and acellular pertussis (Tdap, Td) vaccine. You may need a Td booster every 10 years. Zoster vaccine. You may need this after age 12. Pneumococcal 13-valent conjugate (PCV13) vaccine. One dose is recommended after age 74. Pneumococcal polysaccharide (PPSV23) vaccine. One dose is recommended after age 74. Talk to your health care provider about which screenings and vaccines you need and how often you need them. This information is not intended to replace advice given to you by your health care provider. Make sure you discuss any questions you have with your health care provider. Document Released: 04/13/2015 Document Revised: 12/05/2015 Document Reviewed: 01/16/2015 Elsevier Interactive Patient Education  2017 [place] Prevention in the Home Falls can cause injuries. They can happen to people of all ages. There are many things you can do to make your home safe and to help prevent falls. What can I do on the outside of my home? Regularly fix the edges of walkways and driveways and fix any cracks. Remove anything that might make you trip as you walk through a door, such as a raised step or threshold. Trim any bushes or trees on the path to your home. Use bright outdoor lighting. Clear any walking paths of anything that might make someone trip, such as rocks or tools. Regularly check to see if handrails are loose or broken. Make sure that both sides of any steps have handrails. Any raised decks and porches should have guardrails on the edges. Have any leaves, snow, or ice cleared regularly. Use sand or salt on walking paths during winter. Clean up any spills in your garage right away. This includes oil or grease spills. What can I do in the bathroom? Use night lights. Install grab bars by the toilet and in the tub and shower. Do not use towel bars as grab  bars. Use non-skid mats or decals in the tub or shower. If you need to sit down in the shower, use a plastic, non-slip stool. Keep the floor dry. Clean up any water that spills on the floor as soon as it happens. Remove soap buildup in the tub or shower regularly. Attach bath mats securely with double-sided non-slip rug tape. Do not have throw rugs and other things on the floor that can make you trip. What can I do in the bedroom? Use night lights. Make sure that you have a light by your bed that is easy to reach. Do not use any sheets or blankets that are too big for your bed. They should not hang down onto the floor. Have a firm chair that has side arms. You can use this for support while you get dressed. Do not have throw rugs and other things on the floor that can make you trip. What can I do in the kitchen? Clean up any spills right away. Avoid walking on wet floors. Keep items that you use a lot in easy-to-reach places. If you need to reach something above you, use a strong step stool that has a grab bar. Keep electrical cords out of the way. Do not use floor polish or wax that makes floors slippery. If you must use wax, use non-skid floor wax. Do not have throw rugs and other things on the floor that can make you trip. What can I do  with my stairs? Do not leave any items on the stairs. Make sure that there are handrails on both sides of the stairs and use them. Fix handrails that are broken or loose. Make sure that handrails are as long as the stairways. Check any carpeting to make sure that it is firmly attached to the stairs. Fix any carpet that is loose or worn. Avoid having throw rugs at the top or bottom of the stairs. If you do have throw rugs, attach them to the floor with carpet tape. Make sure that you have a light switch at the top of the stairs and the bottom of the stairs. If you do not have them, ask someone to add them for you. What else can I do to help prevent  falls? Wear shoes that: Do not have high heels. Have rubber bottoms. Are comfortable and fit you well. Are closed at the toe. Do not wear sandals. If you use a stepladder: Make sure that it is fully opened. Do not climb a closed stepladder. Make sure that both sides of the stepladder are locked into place. Ask someone to hold it for you, if possible. Clearly mark and make sure that you can see: Any grab bars or handrails. First and last steps. Where the edge of each step is. Use tools that help you move around (mobility aids) if they are needed. These include: Canes. Walkers. Scooters. Crutches. Turn on the lights when you go into a dark area. Replace any light bulbs as soon as they burn out. Set up your furniture so you have a clear path. Avoid moving your furniture around. If any of your floors are uneven, fix them. If there are any pets around you, be aware of where they are. Review your medicines with your doctor. Some medicines can make you feel dizzy. This can increase your chance of falling. Ask your doctor what other things that you can do to help prevent falls. This information is not intended to replace advice given to you by your health care provider. Make sure you discuss any questions you have with your health care provider. Document Released: 01/11/2009 Document Revised: 08/23/2015 Document Reviewed: 04/21/2014 Elsevier Interactive Patient Education  2017 [employer].

## 2021-01-15 NOTE — Progress Notes (Signed)
Subjective:   Grace [name] is a 75 y.o. female who presents for Medicare Annual (Subsequent) preventive examination.  Review of Systems     Cardiac Risk Factors include: advanced age (>73men, >75 women);sedentary lifestyle     Objective:    Today's Vitals   01/15/21 1341 01/15/21 1346  BP: 114/62   Pulse: 74   SpO2: 99%   Weight: 121 lb (54.9 kg)   Height: 5\' 2"  (1.575 m)   PainSc:  4    Body mass index is 22.13 kg/m.  Advanced Directives 01/15/2021 02/16/2020 01/27/2018 07/17/2017 02/09/2017 02/09/2017 03/06/2016  Does Patient Have a Medical Advance Directive? No No No No No No No  Type of Advance Directive - - - - - - -  Copy of Healthcare Power of Attorney in Chart? - - - - - - -  Would patient like information on creating a medical advance directive? No - Patient declined Yes (MAU/Ambulatory/Procedural Areas - Information given) No - Patient declined No - Patient declined No - Patient declined No - Patient declined Yes (MAU/Ambulatory/Procedural Areas - Information given)  Pre-existing out of facility DNR order (yellow form or pink MOST form) - - - - - - -    Current Medications (verified) Outpatient Encounter Medications as of 01/15/2021  Medication Sig   ALPRAZolam (XANAX) 0.25 MG tablet Take 1/2-1 tablet po BID prn anxiety. CAUTION:DROWSINESS   Calcium Carb-Cholecalciferol (CALCIUM+D3) 600-800 MG-UNIT TABS Take 1 tablet by mouth 2 (two) times daily with a meal.   Cholecalciferol (VITAMIN D3) 50 MCG (2000 UT) capsule Take 2,000 Units by mouth daily.    ezetimibe (ZETIA) 10 MG tablet TAKE ONE TABLET BY MOUTH ONCE DAILY.   levothyroxine (SYNTHROID) 75 MCG tablet TAKE 1 TABLET TUESDAY TO   SUNDAY AND 1/2 TABLET ON   MONDAY   Multiple Vitamins-Minerals (ALIVE WOMENS 50+ PO) Take 1 tablet by mouth daily.   ondansetron (ZOFRAN ODT) 4 MG disintegrating tablet Take 1 tablet (4 mg total) by mouth every 8 (eight) hours as needed for nausea or vomiting.   OVER THE COUNTER  MEDICATION Colace miralax   vitamin B-12 (CYANOCOBALAMIN) 1000 MCG tablet Take 1,000 mcg by mouth daily.   No facility-administered encounter medications on file as of 01/15/2021.    Allergies (verified) Compazine, Statins, and Sulfa drugs cross reactors   History: Past Medical History:  Diagnosis Date   Cancer ([place])    Skin Cancer, Squamous Cell   Diastolic dysfunction 25/63/8937   Ejection fraction 75%.   Dry eyes, bilateral    Family history of anesthesia complication    Mother was hard to wake up after anesthesia   Frequency of urination    Heart murmur    Hyperlipidemia    Diet controlled   Hypothyroid    Migraine 2013   Mitral valve prolapse    Stroke ([place])    3 episodes in past yr   TIA (transient ischemic attack) 02/19/2012   Past Surgical History:  Procedure Laterality Date   BALLOON DILATION N/A 07/28/2013   Procedure: BALLOON DILATION;  Surgeon: Rogene Houston, MD;  [facility];  Service: Endoscopy;  Laterality: N/A;   BIOPSY  02/16/2020   Procedure: BIOPSY;  Surgeon: Rogene Houston, MD;  [facility];  Service: Endoscopy;;   BREAST BIOPSY     left benign tumor   CATARACT EXTRACTION W/PHACO Right 07/11/2013   Procedure: CATARACT EXTRACTION PHACO AND INTRAOCULAR LENS PLACEMENT ([place]);  Surgeon: Tonny Branch, MD;  Location: [facility];  Service: Ophthalmology;  Laterality: Right;  CDE 9.99   CATARACT EXTRACTION W/PHACO Left 08/04/2013   Procedure: CATARACT EXTRACTION PHACO AND INTRAOCULAR LENS PLACEMENT ([facility]);  Surgeon: Tonny Branch, MD;  Location: [facility];  Service: Ophthalmology;  Laterality: Left;  CDE:16.26   CHOLECYSTECTOMY     COLONOSCOPY  12/18/2010   Procedure: COLONOSCOPY;  Surgeon: Rogene Houston, MD;  [facility];  Service: Endoscopy;  Laterality: N/A;  1:00 pm   COLONOSCOPY N/A 03/06/2016   Procedure: COLONOSCOPY;  Surgeon: Rogene Houston, MD;  [facility];  Service: Endoscopy;  Laterality: N/A;  1200    ESOPHAGEAL DILATION N/A 01/27/2018   Procedure: ESOPHAGEAL DILATION;  Surgeon: Rogene Houston, MD;  [facility];  Service: Endoscopy;  Laterality: N/A;   ESOPHAGEAL DILATION N/A 02/16/2020   Procedure: ESOPHAGEAL DILATION;  Surgeon: Rogene Houston, MD;  [facility];  Service: Endoscopy;  Laterality: N/A;   ESOPHAGOGASTRODUODENOSCOPY     ESOPHAGOGASTRODUODENOSCOPY N/A 07/28/2013   Procedure: ESOPHAGOGASTRODUODENOSCOPY (EGD);  Surgeon: Rogene Houston, MD;  [facility];  Service: Endoscopy;  Laterality: N/A;  125   ESOPHAGOGASTRODUODENOSCOPY N/A 01/27/2018   Procedure: ESOPHAGOGASTRODUODENOSCOPY (EGD);  Surgeon: Rogene Houston, MD;  [facility];  Service: Endoscopy;  Laterality: N/A;  2:00-moved to 10/30 @ 2:45pm per Ann   ESOPHAGOGASTRODUODENOSCOPY N/A 02/16/2020   Procedure: ESOPHAGOGASTRODUODENOSCOPY (EGD);  Surgeon: Rogene Houston, MD;  [facility];  Service: Endoscopy;  Laterality: N/A;  10:00   LEFT HEART CATH AND CORONARY ANGIOGRAPHY N/A 07/17/2017   Procedure: LEFT HEART CATH AND CORONARY ANGIOGRAPHY;  Surgeon: Sherren Mocha, MD;  Location: [place] [facility];  Service: Cardiovascular;  Laterality: N/A;   MALONEY DILATION N/A 07/28/2013   Procedure: Venia Minks DILATION;  Surgeon: Rogene Houston, MD;  [facility];  Service: Endoscopy;  Laterality: N/A;   SAVORY DILATION N/A 07/28/2013   Procedure: SAVORY DILATION;  Surgeon: Rogene Houston, MD;  [facility];  Service: Endoscopy;  Laterality: N/A;   THYROIDECTOMY  11/20/2011   Procedure: THYROIDECTOMY;  Surgeon: Izora Gala, MD;  Location: [place];  Service: ENT;  Laterality: Left;  LEFT THYROID LOBECTOMY   TUBAL LIGATION     39 yrs ago.   Family History  Problem Relation Age of Onset   Heart disease Mother    Hypertension Mother    Stroke Mother    Thyroid disease Mother    Dementia Mother    Stroke Maternal Grandmother    Cancer Paternal  Grandmother        breast   Colon cancer Neg Hx    Social History   Socioeconomic History   Marital status: Widowed    Spouse name: Not on file   Number of children: 2   Years of education: Not on file   Highest education level: High school graduate  Occupational History   Not on file  Tobacco Use   Smoking status: Never   Smokeless tobacco: Never  Vaping Use   Vaping Use: Never used  Substance and Sexual Activity   Alcohol use: No    Alcohol/week: 0.0 standard drinks   Drug use: No   Sexual activity: Not Currently  Other Topics Concern   Not on file  Social History Narrative   12/11/20 lives alone long term boyfriend passed away in April 29, 2020   caffeine- one soda daily, coffee, 1/2 cup   2 children  High school   3 grandchildren   Social Determinants of Health   Financial Resource Strain: Low Risk    Difficulty of Paying Living Expenses: Not hard at all  Food Insecurity: No Food Insecurity   Worried About Charity fundraiser in the Last Year: Never true   Arboriculturist in the Last Year: Never true  Transportation Needs: No Transportation Needs   Lack of Transportation (Medical): No   Lack of Transportation (Non-Medical): No  Physical Activity: Insufficiently Active   Days of Exercise per Week: 3 days   Minutes of Exercise per Session: 20 min  Stress: No Stress Concern Present   Feeling of Stress : Only a little  Social Connections: Moderately Integrated   Frequency of Communication with Friends and Family: More than three times a week   Frequency of Social Gatherings with Friends and Family: More than three times a week   Attends Religious Services: 1 to 4 times per year   Active Member of [employer] or Organizations: Yes   Attends Archivist Meetings: 1 to 4 times per year   Marital Status: Widowed    Tobacco Counseling Counseling given: Not Answered   Clinical Intake:  Pre-visit preparation completed: Yes  Pain : 0-10 Pain Score: 4  Pain  Type: Acute pain Pain Location: Back Pain Descriptors / Indicators: Aching Pain Onset: Today Pain Frequency: Intermittent     BMI - recorded: 22.13 Nutritional Status: BMI of 19-24  Normal Nutritional Risks: None Diabetes: No  How often do you need to have someone help you when you read instructions, pamphlets, or other written materials from your doctor or pharmacy?: 1 - Never  Diabetic?NO  Interpreter Needed?: No  Information entered by :: MJ Inocente Krach, LPN   Activities of Daily Living In your present state of health, do you have any difficulty performing the following activities: 01/15/2021  Hearing? N  Vision? N  Difficulty concentrating or making decisions? Y  Comment Since Covid Dx.  Walking or climbing stairs? N  Dressing or bathing? N  Doing errands, shopping? N  Preparing Food and eating ? N  Using the Toilet? N  In the past six months, have you accidently leaked urine? N  Do you have problems with loss of bowel control? N  Managing your Medications? N  Managing your Finances? N  Housekeeping or managing your Housekeeping? N  Some recent data might be hidden    Patient Care Team: Kathyrn Drown, MD as PCP - General (Family Medicine) Herminio Commons, MD (Inactive) as PCP - Cardiology (Cardiology)  Indicate any recent Medical Services you may have received from other than [facility] providers in the past year (date may be approximate).     Assessment:   This is a routine wellness examination for [place].  Hearing/Vision screen Hearing Screening - Comments:: No hearing issues.  Vision Screening - Comments:: Readers. Dr. Jorja Loa. 2022.  Dietary issues and exercise activities discussed: Current Exercise Habits: Home exercise routine, Type of exercise: walking, Time (Minutes): 15, Frequency (Times/Week): 3, Weekly Exercise (Minutes/Week): 45, Intensity: Mild, Exercise limited by: cardiac condition(s)   Goals Addressed             This Visit's Progress     Exercise 3x per week (30 min per time)         Depression Screen PHQ 2/9 Scores 01/15/2021 07/30/2020 07/30/2020 04/02/2020 02/20/2020 01/15/2018 06/02/2016  PHQ - 2 Score 1 2 0 1 0 0 0  PHQ- 9  Score - 8 - - - - -    Fall Risk Fall Risk  01/15/2021 07/30/2020 06/30/2019 06/14/2019 01/15/2018  Falls in the past year? 0 0 0 0 No  Number falls in past yr: 0 0 - 0 -  Injury with Fall? 0 0 - 0 -  Risk for fall due to : No Fall Risks - - - -  Follow up Falls prevention discussed Falls evaluation completed - Falls evaluation completed -    FALL RISK PREVENTION PERTAINING TO THE HOME:  Any stairs in or around the home? Yes  If so, are there any without handrails? No  Home free of loose throw rugs in walkways, pet beds, electrical cords, etc? Yes  Adequate lighting in your home to reduce risk of falls? Yes   ASSISTIVE DEVICES UTILIZED TO PREVENT FALLS:  Life alert? No  Use of a cane, walker or w/c? No  Grab bars in the bathroom? No  Shower chair or bench in shower? No  Elevated toilet seat or a handicapped toilet? Yes   TIMED UP AND GO:  Was the test performed? Yes .  Length of time to ambulate 10 feet: 7 sec.   Gait steady and fast without use of assistive device  Cognitive Function: MMSE - Mini Mental State Exam 12/11/2020  Orientation to time 5  Orientation to Place 5  Registration 3  Attention/ Calculation 2  Recall 1  Language- name 2 objects 2  Language- repeat 0  Language- follow 3 step command 3  Language- read & follow direction 1  Write a sentence 1  Copy design 0  Total score 23     6CIT Screen 01/15/2021  What Year? 0 points  What month? 0 points  What time? 0 points  Count back from 20 0 points  Months in reverse 0 points  Repeat phrase 0 points  Total Score 0    Immunizations Immunization History  Administered Date(s) Administered   Fluad Quad(high Dose 65+) 12/19/2019   Influenza Split 01/25/2013   Influenza, High Dose Seasonal PF 01/20/2019    Influenza,inj,Quad PF,6+ Mos 12/29/2013, 01/15/2015, 01/07/2016, 12/23/2016, 01/15/2018   Influenza-Unspecified 12/30/2011   Moderna Sars-Covid-2 Vaccination 05/26/2019, 06/24/2019   Pneumococcal Conjugate-13 01/12/2014   Pneumococcal Polysaccharide-23 12/30/2011, 04/20/2018   Zoster, Live 04/17/2011    TDAP status: Due, Education has been provided regarding the importance of this vaccine. Advised may receive this vaccine at local pharmacy or Health Dept. Aware to provide a copy of the vaccination record if obtained from local pharmacy or Health Dept. Verbalized acceptance and understanding.  Flu Vaccine status: Completed at today's visit  Pneumococcal vaccine status: Up to date  Covid-19 vaccine status: Information provided on how to obtain vaccines.   Qualifies for Shingles Vaccine? Yes   Zostavax completed Yes   Shingrix Completed?: No.    Education has been provided regarding the importance of this vaccine. Patient has been advised to call insurance company to determine out of pocket expense if they have not yet received this vaccine. Advised may also receive vaccine at local pharmacy or Health Dept. Verbalized acceptance and understanding.  Screening Tests Health Maintenance  Topic Date Due   TETANUS/TDAP  Never done   Zoster Vaccines- Shingrix (1 of 2) Never done   COVID-19 Vaccine (3 - Booster for Moderna series) 11/24/2019   INFLUENZA VACCINE  10/29/2020   MAMMOGRAM  12/19/2020   COLONOSCOPY (Pts 45-54yrs Insurance coverage will need to be confirmed)  03/06/2021   DEXA SCAN  Completed   Hepatitis C Screening  Completed   HPV VACCINES  Aged Out    Health Maintenance  Health Maintenance Due  Topic Date Due   TETANUS/TDAP  Never done   Zoster Vaccines- Shingrix (1 of 2) Never done   COVID-19 Vaccine (3 - Booster for Moderna series) 11/24/2019   INFLUENZA VACCINE  10/29/2020   MAMMOGRAM  12/19/2020    Colorectal cancer screening: Type of screening: Colonoscopy.  Completed 03/06/2016. Repeat every 5 years  Mammogram status: Ordered 01/15/2021. Pt provided with contact info and advised to call to schedule appt.   Bone Density status: Completed 12/16/2019. Results reflect: Bone density results: OSTEOPOROSIS. Repeat every 2 years.  Lung Cancer Screening: (Low Dose CT Chest recommended if Age 46-80 years, 30 pack-year currently smoking OR have quit w/in 15years.) does not qualify.  Additional Screening:  Hepatitis C Screening: does qualify; Completed 01/17/2016  Vision Screening: Recommended annual ophthalmology exams for early detection of glaucoma and other disorders of the eye. Is the patient up to date with their annual eye exam?  Yes  Who is the provider or what is the name of the office in which the patient attends annual eye exams? [facility] If pt is not established with a provider, would they like to be referred to a provider to establish care? No .   Dental Screening: Recommended annual dental exams for proper oral hygiene  Community Resource Referral / Chronic Care Management: CRR required this visit?  No   CCM required this visit?  No      Plan:     I have personally reviewed and noted the following in the patient's chart:   Medical and social history Use of alcohol, tobacco or illicit drugs  Current medications and supplements including opioid prescriptions.  Functional ability and status Nutritional status Physical activity Advanced directives List of other physicians Hospitalizations, surgeries, and ER visits in previous 12 months Vitals Screenings to include cognitive, depression, and falls Referrals and appointments  In addition, I have reviewed and discussed with patient certain preventive protocols, quality metrics, and best practice recommendations. A written personalized care plan for preventive services as well as general preventive health recommendations were provided to patient.     Chriss Driver,  LPN   29/56/2130   Nurse Notes: Pt states she is doing well other than some back pain after moving furniture. Flu vaccine given today. Mammogram and Colonoscopy ordered today at pt's request.

## 2021-01-23 ENCOUNTER — Ambulatory Visit: Payer: Medicare HMO | Admitting: "Endocrinology

## 2021-01-29 ENCOUNTER — Other Ambulatory Visit (HOSPITAL_COMMUNITY): Payer: Self-pay | Admitting: Family Medicine

## 2021-01-29 DIAGNOSIS — R921 Mammographic calcification found on diagnostic imaging of breast: Secondary | ICD-10-CM

## 2021-02-25 ENCOUNTER — Encounter (INDEPENDENT_AMBULATORY_CARE_PROVIDER_SITE_OTHER): Payer: Self-pay | Admitting: *Deleted

## 2021-03-04 ENCOUNTER — Ambulatory Visit (INDEPENDENT_AMBULATORY_CARE_PROVIDER_SITE_OTHER): Payer: Medicare HMO | Admitting: Nurse Practitioner

## 2021-03-04 ENCOUNTER — Encounter: Payer: Self-pay | Admitting: Nurse Practitioner

## 2021-03-04 ENCOUNTER — Other Ambulatory Visit: Payer: Self-pay

## 2021-03-04 VITALS — BP 142/74 | HR 78 | Temp 98.1°F | Ht 62.0 in | Wt 118.6 lb

## 2021-03-04 DIAGNOSIS — R519 Headache, unspecified: Secondary | ICD-10-CM | POA: Diagnosis not present

## 2021-03-04 DIAGNOSIS — J069 Acute upper respiratory infection, unspecified: Secondary | ICD-10-CM

## 2021-03-04 NOTE — Patient Instructions (Signed)
[facility] Nasal Rinse

## 2021-03-04 NOTE — Progress Notes (Signed)
Subjective:    Patient ID: Grace [name], female    DOB: 02/15/1946, 75 y.o.   MRN: [id number]  HPI Pt having cough, congestion and headache at temple area that started yesterday afternoon. Patient admits to chills, body aches, , hoarseness, and runny nose. Last night had sore throat but that has improved. Pt unable to sleep last night due to chest congestion and some chest tightness. Utilizing Tylenol and drinking hot tea/coffee which helps.   Patient currently denies headaches, chest tightness, or diarrhea. Patient is unsure if she has had a fever.   Patient mentioned that she gets a headache around her temple area sometimes where she feels a bump around her right temple. Patient denies that she has pain to her temples or a bump to her right temple today.   Review of Systems  Constitutional:  Positive for chills and fatigue.  HENT:  Positive for congestion, rhinorrhea and sore throat. Negative for ear discharge, ear pain, facial swelling, hearing loss, postnasal drip, sinus pressure, sinus pain and sneezing.   Eyes:  Negative for photophobia, pain, discharge, redness, itching and visual disturbance.  Respiratory:  Positive for cough. Negative for wheezing.   Cardiovascular:  Negative for chest pain.      Objective:   Physical Exam Constitutional:      General: She is not in acute distress.    Appearance: Normal appearance. She is normal weight. She is not ill-appearing or toxic-appearing.  HENT:     Head: Normocephalic and atraumatic.     Right Ear: Tympanic membrane, ear canal and external ear normal.     Left Ear: Tympanic membrane, ear canal and external ear normal.     Nose: Nose normal. No congestion.     Mouth/Throat:     Mouth: Mucous membranes are moist.     Pharynx: No oropharyngeal exudate or posterior oropharyngeal erythema.  Eyes:     General:        Right eye: No discharge.        Left eye: No discharge.     Extraocular Movements: Extraocular movements intact.      Conjunctiva/sclera: Conjunctivae normal.     Pupils: Pupils are equal, round, and reactive to light.  Cardiovascular:     Rate and Rhythm: Normal rate and regular rhythm.     Pulses: Normal pulses.     Heart sounds: Normal heart sounds. No murmur heard. Pulmonary:     Effort: Pulmonary effort is normal. No respiratory distress.     Breath sounds: Normal breath sounds. No stridor. No wheezing, rhonchi or rales.  Chest:     Chest wall: No tenderness.  Musculoskeletal:        General: Normal range of motion.     Cervical back: Normal range of motion and neck supple. No rigidity or tenderness.  Lymphadenopathy:     Cervical: No cervical adenopathy.  Skin:    General: Skin is warm.     Capillary Refill: Capillary refill takes less than 2 seconds.     Coloration: Skin is not pale.     Findings: No erythema or rash.  Neurological:     General: No focal deficit present.     Mental Status: She is alert and oriented to person, place, and time.     Cranial Nerves: Cranial nerves 2-12 are intact.     Sensory: Sensation is intact.     Motor: Motor function is intact.     Coordination: Coordination is intact.  Gait: Gait is intact.  Psychiatric:        Mood and Affect: Mood normal.        Behavior: Behavior normal.          Assessment & Plan:  1. Upper respiratory tract infection, unspecified type - Likely flu. - Symptom management - May use OTC ibuprofen or tylenol - May use neti Pot or other nasal wash to help with nasal congestion - Advised to avoid OTC nasal decongestants to prevent issues with BP - Follow if symptoms do not improve or worse. Follow up in clinic or Urgent care if you develop high fever, SOB, or worsening chest tightness. - COVID-19, Flu A+B and RSV  2. Acute nonintractable headache, unspecified headache type - Patient denies having headache symptoms today. - If patient presents with headache to temple region in the future will consider [employer]  work up.

## 2021-03-06 LAB — COVID-19, FLU A+B AND RSV
Influenza A, NAA: NOT DETECTED
Influenza B, NAA: NOT DETECTED
RSV, NAA: NOT DETECTED
SARS-CoV-2, NAA: NOT DETECTED

## 2021-03-12 ENCOUNTER — Other Ambulatory Visit: Payer: Self-pay

## 2021-03-12 ENCOUNTER — Ambulatory Visit (HOSPITAL_COMMUNITY)
Admission: RE | Admit: 2021-03-12 | Discharge: 2021-03-12 | Disposition: A | Discharge status: Home / Self Care | Payer: Medicare HMO | Source: Ambulatory Visit | Attending: Family Medicine | Admitting: Family Medicine

## 2021-03-12 ENCOUNTER — Encounter (HOSPITAL_COMMUNITY): Payer: Self-pay

## 2021-03-12 DIAGNOSIS — R921 Mammographic calcification found on diagnostic imaging of breast: Secondary | ICD-10-CM

## 2021-03-13 ENCOUNTER — Ambulatory Visit (INDEPENDENT_AMBULATORY_CARE_PROVIDER_SITE_OTHER): Payer: Medicare HMO | Admitting: Family Medicine

## 2021-03-13 VITALS — BP 148/70 | HR 102 | Temp 98.3°F | Ht 62.0 in | Wt 119.2 lb

## 2021-03-13 DIAGNOSIS — J019 Acute sinusitis, unspecified: Secondary | ICD-10-CM | POA: Diagnosis not present

## 2021-03-13 DIAGNOSIS — R051 Acute cough: Secondary | ICD-10-CM | POA: Diagnosis not present

## 2021-03-13 MED ORDER — CEFDINIR 300 MG PO CAPS: 300.0000 mg | ORAL_CAPSULE | Freq: Two times a day (BID) | ORAL | 0 refills | Status: DC

## 2021-03-13 MED ORDER — BENZONATATE 100 MG PO CAPS: 100.0000 mg | ORAL_CAPSULE | Freq: Three times a day (TID) | ORAL | 0 refills | Status: DC | PRN

## 2021-03-13 NOTE — Progress Notes (Signed)
° °  Subjective:    Patient ID: Grace [name], female    DOB: 1946-03-11, 75 y.o.   MRN: [id number]  Cough The current episode started 1 to 4 weeks ago. The problem has been gradually worsening. Associated symptoms comments: Runny nose and stomach doesn't feel right this morning. She has tried OTC cough suppressant for the symptoms.  Patient states she was seen on 03/04/2021.    Review of Systems  Respiratory:  Positive for cough.       Objective:   Physical Exam  Gen-NAD not toxic TMS-normal bilateral T- normal no redness Chest-CTA respiratory rate normal no crackles CV RRR no murmur Skin-warm dry Neuro-grossly normal       Assessment & Plan:  I believe that she started off with a viral illness plan over the past 7 to 10 days it has evolved into secondary infection within the bronchioles as well as the sinuses  I find no evidence of pneumonia I do not feel that the patient needs any type of x-rays or blood work  Antibiotics as well as cough suppressant was sent in I did tell Ninette that this could take several more weeks to totally resolve to the point where she no longer had respiratory symptoms. I believe that she is past the contagious phase She can lay low for a few more days then be around other individuals

## 2021-04-03 ENCOUNTER — Telehealth (INDEPENDENT_AMBULATORY_CARE_PROVIDER_SITE_OTHER): Payer: Self-pay

## 2021-04-03 ENCOUNTER — Other Ambulatory Visit (INDEPENDENT_AMBULATORY_CARE_PROVIDER_SITE_OTHER): Payer: Self-pay

## 2021-04-03 ENCOUNTER — Encounter (INDEPENDENT_AMBULATORY_CARE_PROVIDER_SITE_OTHER): Payer: Self-pay

## 2021-04-03 DIAGNOSIS — Z86010 Personal history of colonic polyps: Secondary | ICD-10-CM

## 2021-04-03 MED ORDER — SUTAB 1479-225-188 MG PO TABS: 1.0000 | ORAL_TABLET | Freq: Once | ORAL | 0 refills | Status: AC

## 2021-04-03 NOTE — Telephone Encounter (Signed)
Markas Aldredge Ann Ladarian Bonczek, CMA  ?

## 2021-04-03 NOTE — Telephone Encounter (Signed)
Referring MD/PCP: Sallee Lange  Procedure: Tcs  Reason/Indication:  hx of polyps  Has patient had this procedure before?  yes  If so, when, by whom and where? 02/2016   Is there a family history of colon cancer?  no  Who?  What age when diagnosed?    Is patient diabetic? If yes, Type 1 or Type 2   no      Does patient have prosthetic heart valve or mechanical valve?  no  Do you have a pacemaker/defibrillator?  no  Has patient ever had endocarditis/atrial fibrillation? no  Does patient use oxygen? no  Has patient had joint replacement within last 12 months?  no  Is patient constipated or do they take laxatives? no  Does patient have a history of alcohol/drug use?  no  Have you had a stroke/heart attack last 6 mths? no  Do you take medicine for weight loss?  no  For female patients,: have you had a hysterectomy or are you post menopausal and do you still have your menstrual cycle? no  Is patient on blood thinner such as Coumadin, Plavix and/or Aspirin? no  Medications: levothyroxine 75 mcg 1 tab tues-sun & 1/2 tab mon, ezetimibe 10 mg daily, vit b12 daily, vit d3 daily, calcium 600 daily, Alive Vitamin daily  Allergies: Compazine, Statins  Medication Adjustment per Dr Laural Golden none  Procedure date & time: Thursday 04/18/21 at 8:05

## 2021-04-08 ENCOUNTER — Other Ambulatory Visit: Payer: Self-pay | Admitting: Family Medicine

## 2021-04-18 ENCOUNTER — Encounter (HOSPITAL_COMMUNITY): Payer: Self-pay | Admitting: Internal Medicine

## 2021-04-18 ENCOUNTER — Encounter (HOSPITAL_COMMUNITY)
Admission: RE | Disposition: A | Discharge status: Home / Self Care | Payer: Self-pay | Source: Home / Self Care | Attending: Internal Medicine

## 2021-04-18 ENCOUNTER — Other Ambulatory Visit: Payer: Self-pay

## 2021-04-18 ENCOUNTER — Ambulatory Visit (HOSPITAL_COMMUNITY)
Admission: RE | Admit: 2021-04-18 | Discharge: 2021-04-18 | Disposition: A | Discharge status: Home / Self Care | Payer: Medicare HMO | Attending: Internal Medicine | Admitting: Internal Medicine

## 2021-04-18 DIAGNOSIS — Z09 Encounter for follow-up examination after completed treatment for conditions other than malignant neoplasm: Secondary | ICD-10-CM | POA: Diagnosis not present

## 2021-04-18 DIAGNOSIS — K644 Residual hemorrhoidal skin tags: Secondary | ICD-10-CM | POA: Diagnosis not present

## 2021-04-18 DIAGNOSIS — Z7982 Long term (current) use of aspirin: Secondary | ICD-10-CM | POA: Diagnosis not present

## 2021-04-18 DIAGNOSIS — Z1211 Encounter for screening for malignant neoplasm of colon: Secondary | ICD-10-CM | POA: Insufficient documentation

## 2021-04-18 DIAGNOSIS — K5909 Other constipation: Secondary | ICD-10-CM | POA: Insufficient documentation

## 2021-04-18 DIAGNOSIS — Z86010 Personal history of colonic polyps: Secondary | ICD-10-CM | POA: Diagnosis not present

## 2021-04-18 HISTORY — PX: COLONOSCOPY: SHX5424

## 2021-04-18 SURGERY — COLONOSCOPY
Anesthesia: Moderate Sedation

## 2021-04-18 MED ORDER — MIDAZOLAM HCL 5 MG/5ML IJ SOLN
INTRAMUSCULAR | Status: DC | PRN
  Administered 2021-04-18 (×3): 2 mg via INTRAVENOUS

## 2021-04-18 MED ORDER — MEPERIDINE HCL 50 MG/ML IJ SOLN
INTRAMUSCULAR | Status: AC
  Filled 2021-04-18: qty 1

## 2021-04-18 MED ORDER — SODIUM CHLORIDE 0.9 % IV SOLN
INTRAVENOUS | Status: DC
  Administered 2021-04-18: 1000 mL via INTRAVENOUS

## 2021-04-18 MED ORDER — MIDAZOLAM HCL 5 MG/5ML IJ SOLN
INTRAMUSCULAR | Status: AC
  Filled 2021-04-18: qty 10

## 2021-04-18 MED ORDER — MEPERIDINE HCL 50 MG/ML IJ SOLN
INTRAMUSCULAR | Status: DC | PRN
  Administered 2021-04-18 (×2): 25 mg via INTRAVENOUS

## 2021-04-18 NOTE — Op Note (Signed)
[facility] Patient Name: Grace [name] Procedure Date: 04/18/2021 8:09 AM MRN: [id number] Date of Birth: [birth year] Attending MD: Hildred Laser , MD CSN: [id number] Age: 76 Admit Type: Outpatient Procedure:                Colonoscopy Indications:              High risk colon cancer surveillance: Personal                            history of colonic polyps Providers:                Hildred Laser, MD, Caprice Kluver, Randa Spike,                            Technician Referring MD:             Sallee Lange, MD Medicines:                Meperidine 50 mg IV, Midazolam 6 mg IV Complications:            No immediate complications. Estimated Blood Loss:     Estimated blood loss: none. Procedure:                Pre-Anesthesia Assessment:                           - Prior to the procedure, a History and Physical                            was performed, and patient medications and                            allergies were reviewed. The patient's tolerance of                            previous anesthesia was also reviewed. The risks                            and benefits of the procedure and the sedation                            options and risks were discussed with the patient.                            All questions were answered, and informed consent                            was obtained. Prior Anticoagulants: The patient has                            taken no previous anticoagulant or antiplatelet                            agents except for aspirin. ASA Grade Assessment: II                            -  A patient with mild systemic disease. After                            reviewing the risks and benefits, the patient was                            deemed in satisfactory condition to undergo the                            procedure.                           After obtaining informed consent, the colonoscope                            was passed under direct vision. Throughout  the                            procedure, the patient's blood pressure, pulse, and                            oxygen saturations were monitored continuously. The                            [id number]-[id number] ([id number]) scope was introduced through                            the anus and advanced to the the cecum, identified                            by appendiceal orifice and ileocecal valve. The                            colonoscopy was performed without difficulty. The                            patient tolerated the procedure well. The quality                            of the bowel preparation was good. The ileocecal                            valve, appendiceal orifice, and rectum were                            photographed. Scope In: 8:44:43 AM Scope Out: 9:02:25 AM Scope Withdrawal Time: 0 hours 8 minutes 19 seconds  Total Procedure Duration: 0 hours 17 minutes 42 seconds  Findings:      The perianal and digital rectal examinations were normal.      The colon (entire examined portion) appeared normal.      External hemorrhoids were found during retroflexion. The hemorrhoids       were small. Impression:               - The entire examined colon is  normal.                           - External hemorrhoids.                           - No specimens collected. Moderate Sedation:      Moderate (conscious) sedation was administered by the endoscopy nurse       and supervised by the endoscopist. The following parameters were       monitored: oxygen saturation, heart rate, blood pressure, CO2       capnography and response to care. Total physician intraservice time was       24 minutes. Recommendation:           - Patient has a contact number available for                            emergencies. The signs and symptoms of potential                            delayed complications were discussed with the                            patient. Return to normal activities tomorrow.                             Written discharge instructions were provided to the                            patient.                           - High fiber diet today.                           - Continue present medications.                           - No recommendation at this time regarding repeat                            colonoscopy due to age and no polyps on today's                            exam. Procedure Code(s):        --- Professional ---                           [phone], Colonoscopy, flexible; diagnostic, including                            collection of specimen(s) by brushing or washing,                            when performed (separate procedure)  [id number], Moderate sedation; each additional 15                            minutes intraservice time                           G0500, Moderate sedation services provided by the                            same physician or other qualified health care                            professional performing a gastrointestinal                            endoscopic service that sedation supports,                            requiring the presence of an independent trained                            observer to assist in the monitoring of the                            patient's level of consciousness and physiological                            status; initial 15 minutes of intra-service time;                            patient age 60 years or older (additional time may                            be reported with [phone], as appropriate) Diagnosis Code(s):        --- Professional ---                           Z86.010, Personal history of colonic polyps                           K64.4, Residual hemorrhoidal skin tags CPT copyright 2019 American Medical Association. All rights reserved. The codes documented in this report are preliminary and upon coder review may  be revised to meet current compliance requirements. Hildred Laser,  MD Hildred Laser, MD 04/18/2021 9:15:09 AM This report has been signed electronically. Number of Addenda: 0

## 2021-04-18 NOTE — H&P (Addendum)
Grace [name] is an 76 y.o. female.   Chief Complaint: Patient is here for colonoscopy HPI:  patient is 76 year old Caucasian female with history of colonic adenomas and is here for surveillance colonoscopy.  She has chronic constipation which she tries to controlled with diet and polyethylene glycol.  She denies rectal bleeding or abdominal pain.  Family history is negative for colorectal carcinoma.   She had 6 small tubular adenomas removed on colonoscopy of September 2012 and she had 4 small polyps removed in December 2017 and 2 of these polyps are tubular adenomas.   Last aspirin dose was 2 weeks ago.   Family history is negative for CRC.   Past Medical History:  Diagnosis Date   Cancer [facility])    Skin Cancer, Squamous Cell   Diastolic dysfunction 14/43/1540   Ejection fraction 75%.   Dry eyes, bilateral    Family history of anesthesia complication    Mother was hard to wake up after anesthesia   Frequency of urination    Heart murmur    Hyperlipidemia    Diet controlled   Hypothyroid    Migraine 2013   Mitral valve prolapse    Stroke ([place])    3 episodes in past yr   TIA (transient ischemic attack) 02/19/2012    Past Surgical History:  Procedure Laterality Date   BALLOON DILATION N/A 07/28/2013   Procedure: BALLOON DILATION;  Surgeon: Rogene Houston, MD;  [facility];  Service: Endoscopy;  Laterality: N/A;   BIOPSY  02/16/2020   Procedure: BIOPSY;  Surgeon: Rogene Houston, MD;  [facility];  Service: Endoscopy;;   BREAST BIOPSY     left benign tumor   CATARACT EXTRACTION W/PHACO Right 07/11/2013   Procedure: CATARACT EXTRACTION PHACO AND INTRAOCULAR LENS PLACEMENT ([place]);  Surgeon: Tonny Branch, MD;  Location: [facility];  Service: Ophthalmology;  Laterality: Right;  CDE 9.99   CATARACT EXTRACTION W/PHACO Left 08/04/2013   Procedure: CATARACT EXTRACTION PHACO AND INTRAOCULAR LENS PLACEMENT ([facility]);  Surgeon: Tonny Branch, MD;  Location: [facility];  Service:  Ophthalmology;  Laterality: Left;  CDE:16.26   CHOLECYSTECTOMY     COLONOSCOPY  12/18/2010   Procedure: COLONOSCOPY;  Surgeon: Rogene Houston, MD;  [facility];  Service: Endoscopy;  Laterality: N/A;  1:00 pm   COLONOSCOPY N/A 03/06/2016   Procedure: COLONOSCOPY;  Surgeon: Rogene Houston, MD;  [facility];  Service: Endoscopy;  Laterality: N/A;  1200   ESOPHAGEAL DILATION N/A 01/27/2018   Procedure: ESOPHAGEAL DILATION;  Surgeon: Rogene Houston, MD;  [facility];  Service: Endoscopy;  Laterality: N/A;   ESOPHAGEAL DILATION N/A 02/16/2020   Procedure: ESOPHAGEAL DILATION;  Surgeon: Rogene Houston, MD;  [facility];  Service: Endoscopy;  Laterality: N/A;   ESOPHAGOGASTRODUODENOSCOPY     ESOPHAGOGASTRODUODENOSCOPY N/A 07/28/2013   Procedure: ESOPHAGOGASTRODUODENOSCOPY (EGD);  Surgeon: Rogene Houston, MD;  [facility];  Service: Endoscopy;  Laterality: N/A;  125   ESOPHAGOGASTRODUODENOSCOPY N/A 01/27/2018   Procedure: ESOPHAGOGASTRODUODENOSCOPY (EGD);  Surgeon: Rogene Houston, MD;  [facility];  Service: Endoscopy;  Laterality: N/A;  2:00-moved to 10/30 @ 2:45pm per Ann   ESOPHAGOGASTRODUODENOSCOPY N/A 02/16/2020   Procedure: ESOPHAGOGASTRODUODENOSCOPY (EGD);  Surgeon: Rogene Houston, MD;  [facility];  Service: Endoscopy;  Laterality: N/A;  10:00   LEFT HEART CATH AND CORONARY ANGIOGRAPHY N/A 07/17/2017   Procedure: LEFT HEART CATH AND CORONARY ANGIOGRAPHY;  Surgeon: Sherren Mocha, MD;  Location: [place] [facility];  Service: Cardiovascular;  Laterality: N/A;   MALONEY DILATION N/A 07/28/2013   Procedure: Venia Minks DILATION;  Surgeon: Rogene Houston, MD;  [facility];  Service: Endoscopy;  Laterality: N/A;   SAVORY DILATION N/A 07/28/2013   Procedure: SAVORY DILATION;  Surgeon: Rogene Houston, MD;  [facility];  Service: Endoscopy;  Laterality: N/A;   THYROIDECTOMY  11/20/2011    Procedure: THYROIDECTOMY;  Surgeon: Izora Gala, MD;  Location: [place];  Service: ENT;  Laterality: Left;  LEFT THYROID LOBECTOMY   TUBAL LIGATION     38 yrs ago.    Family History  Problem Relation Age of Onset   Heart disease Mother    Hypertension Mother    Stroke Mother    Thyroid disease Mother    Dementia Mother    Stroke Maternal Grandmother    Cancer Paternal Grandmother        breast   Colon cancer Neg Hx    Social History:  reports that she has never smoked. She has never used smokeless tobacco. She reports that she does not drink alcohol and does not use drugs.  Allergies:  Allergies  Allergen Reactions   Compazine Other (See Comments)    Slurred speech   Statins Other (See Comments)    myalgias   Sulfa Drugs Cross Reactors Rash    Medications Prior to Admission  Medication Sig Dispense Refill   aspirin EC 81 MG tablet Take 81 mg by mouth once a week. Swallow whole.     Calcium Carb-Cholecalciferol (CALCIUM+D3) 600-800 MG-UNIT TABS Take 1 tablet by mouth daily.     Cholecalciferol (VITAMIN D3) 50 MCG (2000 UT) capsule Take 2,000 Units by mouth daily.      ezetimibe (ZETIA) 10 MG tablet TAKE ONE TABLET BY MOUTH ONCE DAILY. 90 tablet 0   levothyroxine (SYNTHROID) 75 MCG tablet TAKE 1 TABLET TUESDAY TO   SUNDAY AND 1/2 TABLET ON   MONDAY 88 tablet 1   Multiple Vitamins-Minerals (ALIVE WOMENS 50+ PO) Take 1 tablet by mouth daily.     Propylene Glycol (SYSTANE BALANCE) 0.6 % SOLN Place 1 drop into both eyes daily as needed (dry eyes).     vitamin B-12 (CYANOCOBALAMIN) 1000 MCG tablet Take 1,000 mcg by mouth daily.     ALPRAZolam (XANAX) 0.25 MG tablet Take 1/2-1 tablet po BID prn anxiety. CAUTION:DROWSINESS (Patient taking differently: Take 0.0625 mg by mouth 2 (two) times daily as needed for anxiety. Take 1/2-1 tablet po BID prn anxiety. CAUTION:DROWSINESS) 20 tablet 0   benzonatate (TESSALON PERLES) 100 MG capsule Take 1 capsule (100 mg total) by mouth 3 (three) times  daily as needed for cough. (Patient not taking: Reported on 04/09/2021) 20 capsule 0   cefdinir (OMNICEF) 300 MG capsule Take 1 capsule (300 mg total) by mouth 2 (two) times daily. (Patient not taking: Reported on 04/09/2021) 20 capsule 0   ondansetron (ZOFRAN ODT) 4 MG disintegrating tablet Take 1 tablet (4 mg total) by mouth every 8 (eight) hours as needed for nausea or vomiting. 24 tablet 0    No results found for this or any previous visit (from the past 48 hour(s)). No results found.  Review of Systems  Blood pressure 139/74, pulse 69, temperature 97.6 F (36.4 C), temperature source Oral, resp. rate 20, height 5\' 2"  (1.575 m), weight 53.1 kg, SpO2 100 %. Physical Exam HENT:     Mouth/Throat:  Mouth: Mucous membranes are moist.     Pharynx: Oropharynx is clear.  Eyes:     Conjunctiva/sclera: Conjunctivae normal.  Cardiovascular:     Rate and Rhythm: Normal rate and regular rhythm.     Heart sounds: Normal heart sounds. No murmur heard. Pulmonary:     Effort: Pulmonary effort is normal.     Breath sounds: Normal breath sounds.  Abdominal:     General: There is no distension.     Palpations: Abdomen is soft. There is no mass.     Tenderness: There is no abdominal tenderness.     Comments: Right subcostal scar  Musculoskeletal:        General: No swelling.     Cervical back: Neck supple.  Lymphadenopathy:     Cervical: No cervical adenopathy.  Skin:    General: Skin is warm and dry.  Neurological:     Mental Status: She is alert.     Assessment/Plan  History of colonic polyps/tubular adenomas Surveillance colonoscopy.  Hildred Laser, MD 04/18/2021, 8:32 AM

## 2021-04-18 NOTE — Discharge Instructions (Signed)
Resume usual medications including aspirin as before. High-fiber diet. No driving for 24 hours. Office visit in 1 year. No recommendations for colonoscopy at this time.  Can discuss in 5 years.

## 2021-04-22 ENCOUNTER — Ambulatory Visit (INDEPENDENT_AMBULATORY_CARE_PROVIDER_SITE_OTHER): Payer: Medicare HMO | Admitting: Gastroenterology

## 2021-04-22 ENCOUNTER — Encounter (HOSPITAL_COMMUNITY): Payer: Self-pay | Admitting: Internal Medicine

## 2021-05-11 IMAGING — MG DIGITAL DIAGNOSTIC BILAT W/ TOMO W/ CAD
6 of 10 series · 6 of 26 positions shown · non-contrast
Comparison: Previous exams.

CLINICAL DATA: Follow-up for probably benign left breast
calcifications. History of remote benign left breast excision.

EXAM:
DIGITAL DIAGNOSTIC BILATERAL MAMMOGRAM WITH TOMO AND CAD

[L ML]
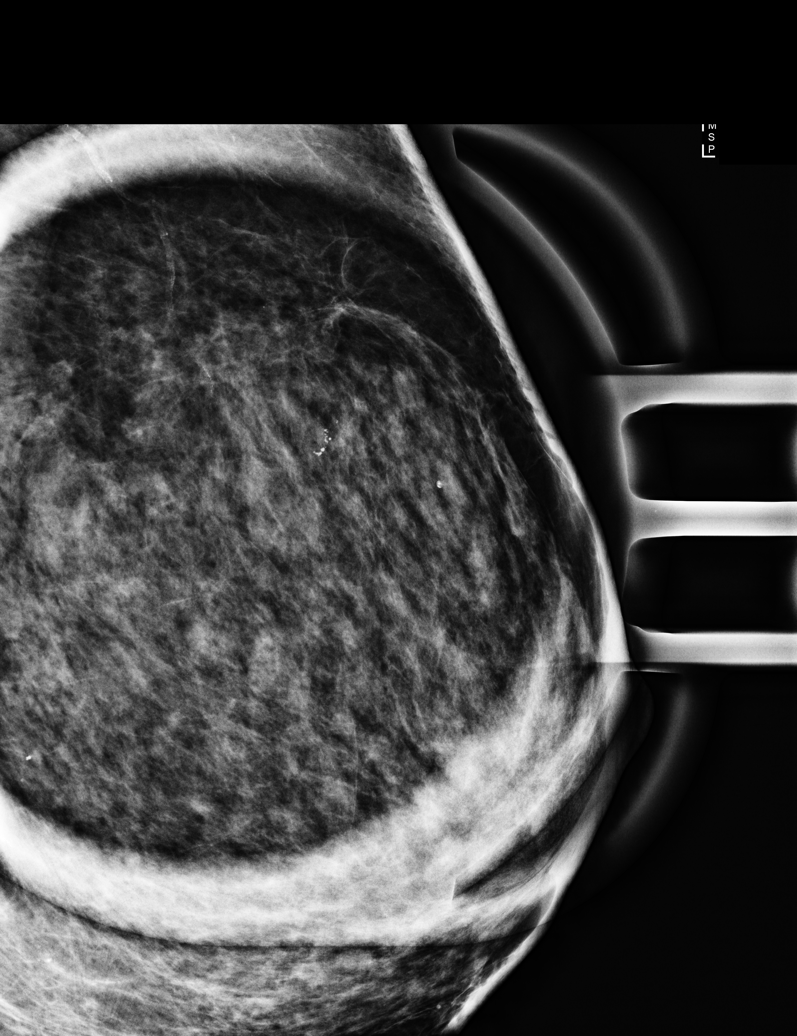

[L CC]
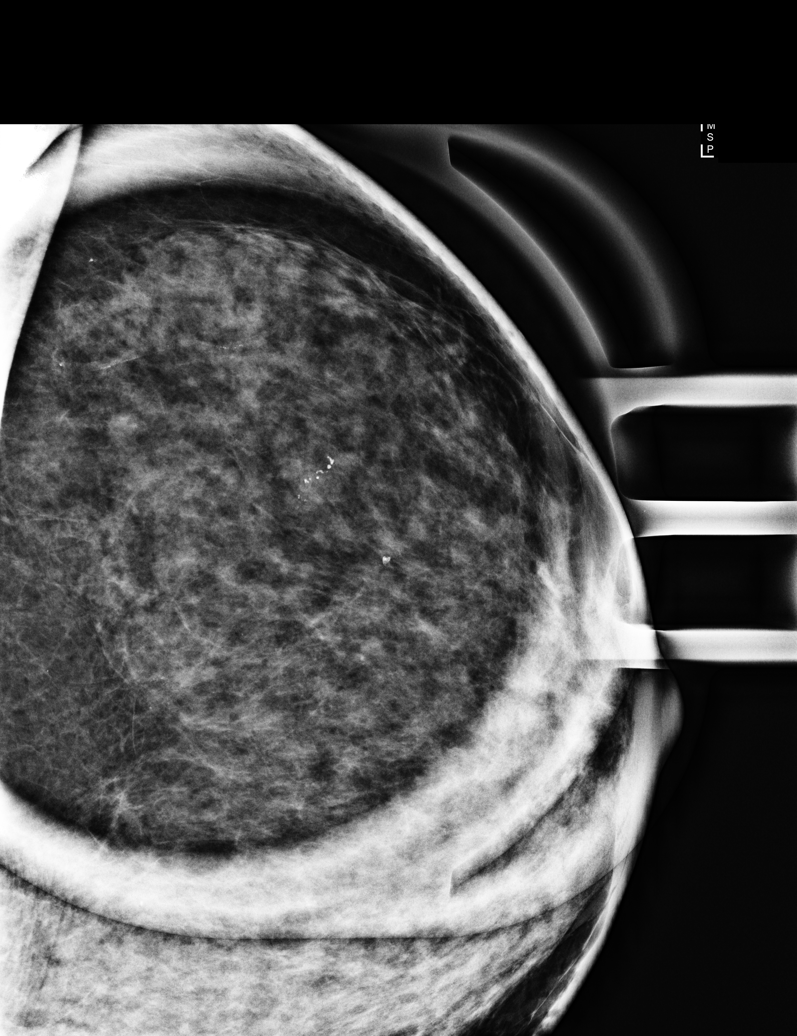

[L MLO synth-2D]
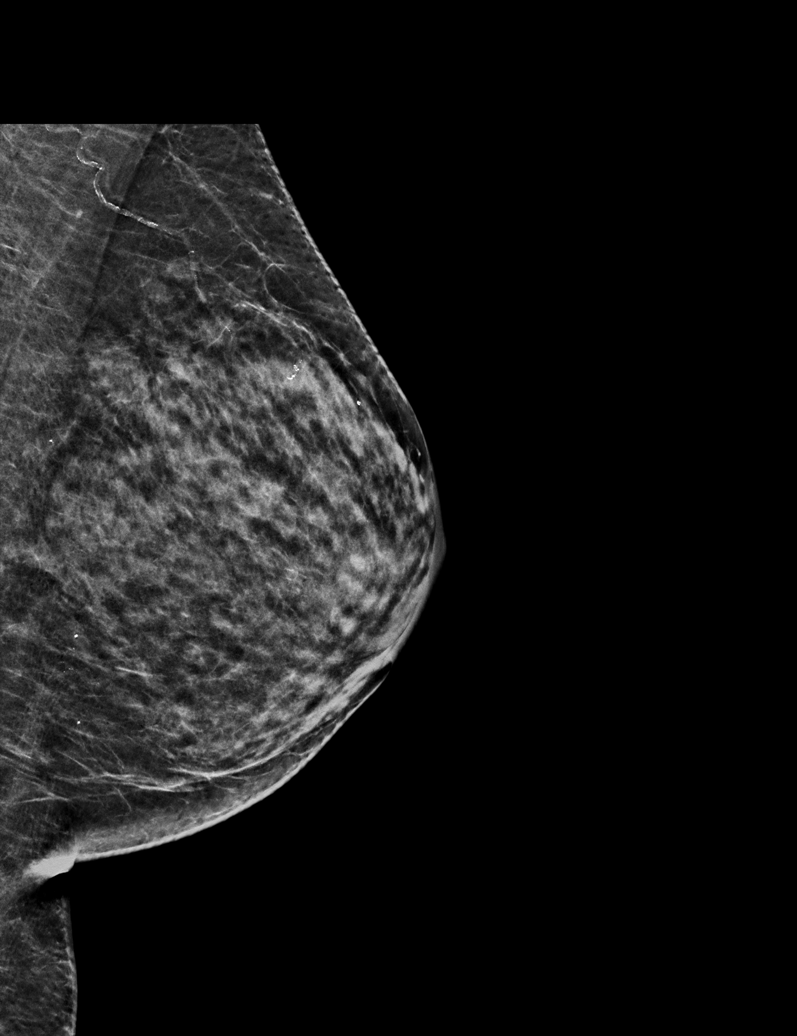

[L CC synth-2D]
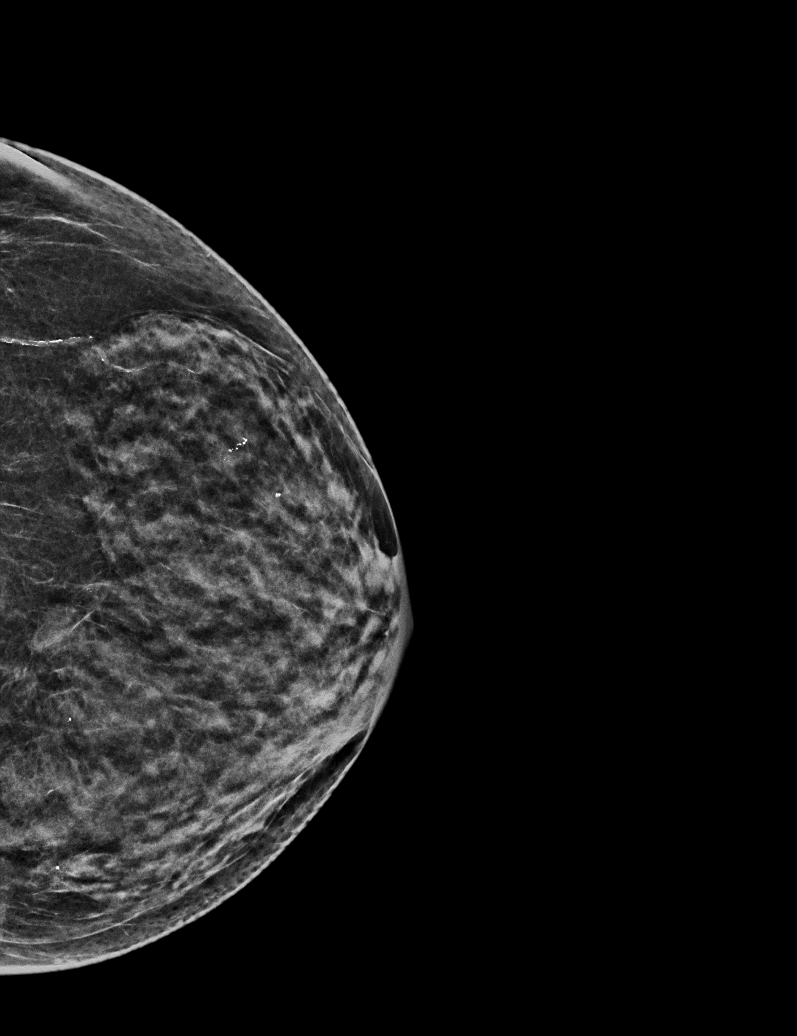

[R MLO synth-2D]
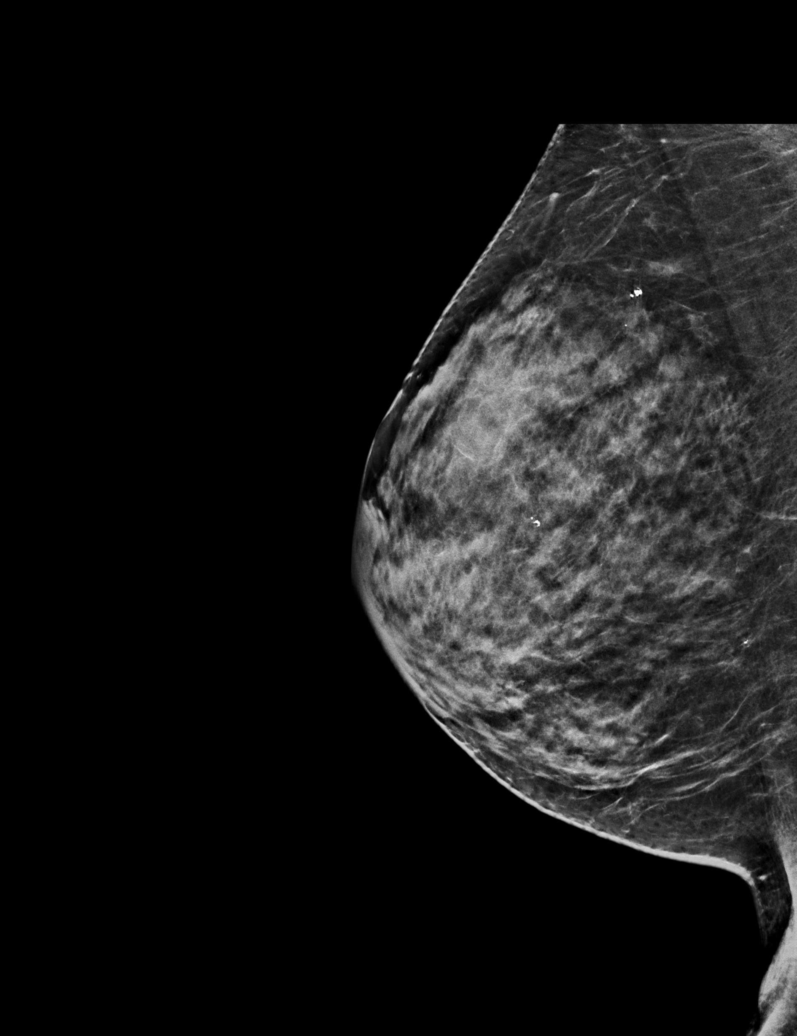

[R CC synth-2D]
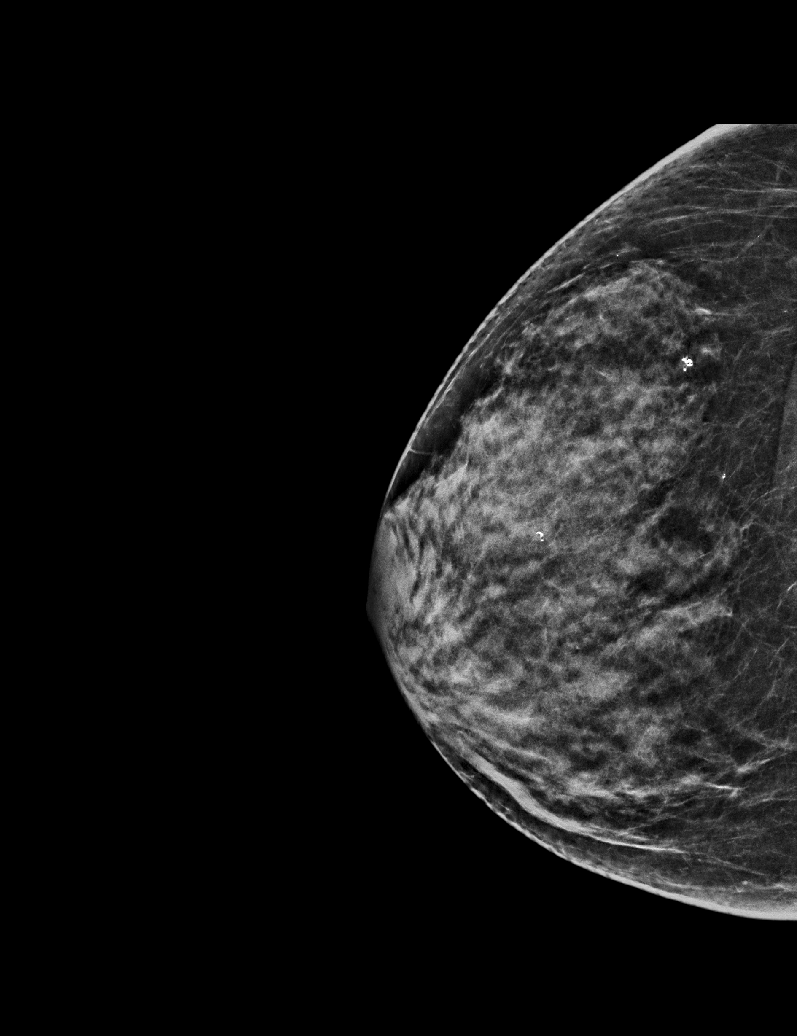

[6 of 26 positions shown; findings below may reference images not displayed]

ACR Breast Density Category c: The breast tissue is heterogeneously
dense, which may obscure small masses.
FINDINGS: No suspicious masses or calcifications are seen in either breast.
Spot compression magnification views were performed over the
upper-outer left breast demonstrating stable appearance of the
cm group of probable dystrophic type calcifications. There is no
mammographic evidence of malignancy in either breast.

Mammographic images were processed with CAD.
IMPRESSION: 1.  Stable probably benign left breast calcifications.

2.  No mammographic evidence of malignancy in either breast.

RECOMMENDATION:
Bilateral diagnostic mammography in 12 months which will demonstrate
2 years of stability of the probably benign left breast
calcifications.

I have discussed the findings and recommendations with the patient.
If applicable, a reminder letter will be sent to the patient
regarding the next appointment.

BI-RADS CATEGORY  3: Probably benign.

## 2021-06-03 ENCOUNTER — Other Ambulatory Visit: Payer: Self-pay

## 2021-06-03 ENCOUNTER — Ambulatory Visit (INDEPENDENT_AMBULATORY_CARE_PROVIDER_SITE_OTHER): Payer: Medicare HMO | Admitting: Family Medicine

## 2021-06-03 ENCOUNTER — Encounter: Payer: Self-pay | Admitting: Family Medicine

## 2021-06-03 VITALS — BP 122/60 | HR 62 | Temp 98.7°F | Wt 120.2 lb

## 2021-06-03 DIAGNOSIS — B349 Viral infection, unspecified: Secondary | ICD-10-CM

## 2021-06-03 NOTE — Progress Notes (Signed)
? ?  Subjective:  ? ? Patient ID: Grace [name], female    DOB: 1945/08/07, 76 y.o.   MRN: [id number] ? ?HPI ? ?Patient has been coughing (cough sounded barky), having a sore throat and tightness in chest since yesterday. ?Patient denies wheezing or difficulty breathing no shortness of breath ?No vomiting or diarrhea ?Review of Systems ? ?   ?Objective:  ? Physical Exam ? ?Gen-NAD not toxic ?TMS-normal bilateral ?T- normal no redness ?Chest-CTA respiratory rate normal no crackles ?CV RRR no murmur ?Skin-warm dry ?Neuro-grossly normal ?\ ? ? ?   ?Assessment & Plan:  ?Viral syndrome ?Bronchial cough ?COVID test ?Await findings ?Hold off on any antibiotics ?Warning signs discussed ?Patient to lay low over the next few days ? ? ?

## 2021-06-04 LAB — NOVEL CORONAVIRUS, NAA: SARS-CoV-2, NAA: NOT DETECTED

## 2021-07-01 ENCOUNTER — Other Ambulatory Visit: Payer: Self-pay | Admitting: Family Medicine

## 2021-07-15 ENCOUNTER — Ambulatory Visit (INDEPENDENT_AMBULATORY_CARE_PROVIDER_SITE_OTHER): Payer: Medicare HMO | Admitting: Family Medicine

## 2021-07-15 ENCOUNTER — Encounter: Payer: Self-pay | Admitting: Family Medicine

## 2021-07-15 VITALS — BP 128/79 | HR 70 | Temp 96.6°F | Wt 121.4 lb

## 2021-07-15 DIAGNOSIS — R002 Palpitations: Secondary | ICD-10-CM | POA: Diagnosis not present

## 2021-07-15 DIAGNOSIS — E7800 Pure hypercholesterolemia, unspecified: Secondary | ICD-10-CM

## 2021-07-15 DIAGNOSIS — R519 Headache, unspecified: Secondary | ICD-10-CM | POA: Diagnosis not present

## 2021-07-15 DIAGNOSIS — F419 Anxiety disorder, unspecified: Secondary | ICD-10-CM | POA: Insufficient documentation

## 2021-07-15 DIAGNOSIS — E038 Other specified hypothyroidism: Secondary | ICD-10-CM | POA: Diagnosis not present

## 2021-07-15 DIAGNOSIS — R7309 Other abnormal glucose: Secondary | ICD-10-CM

## 2021-07-15 MED ORDER — ALPRAZOLAM 0.25 MG PO TABS: ORAL_TABLET | ORAL | 0 refills | Status: DC

## 2021-07-15 NOTE — Assessment & Plan Note (Signed)
Very anxious.  This is likely contributing to multiple underlying symptoms.  Refilling alprazolam. ?

## 2021-07-15 NOTE — Assessment & Plan Note (Signed)
Referring to cardiology.  Needs Holter. ?

## 2021-07-15 NOTE — Patient Instructions (Signed)
Labs today. ? ?I have placed a referral to cardiology. ? ?Dr. Nicki Reaper is in agreement. ? ?Follow up with Dr. Nicki Reaper in the next few weeks. ?

## 2021-07-15 NOTE — Assessment & Plan Note (Signed)
Given vision changes and temporal headache, ESR to assess for the possibility of temporal arteritis. ?

## 2021-07-15 NOTE — Progress Notes (Signed)
? ?Subjective:  ?Patient ID: Grace [name], female    DOB: 1945-06-12  Age: 76 y.o. MRN: [id number] ? ?CC: ?Chief Complaint  ?Patient presents with  ? Headache  ?  Blurred vision-pt states she can be watching TV and all of sudden vision will go blurry (pt states she has had some mini stokes). Cataract surgery 2015. Has seen Dr.Cotter recently. Pt states she feels like something is wrong with left eye. Heaviness around chest. Headaches come and go. Knot on both temple areas; right side feels like it gets bigger at times.   ? ? ?HPI: ? ?76 year old female presents with multiple complaints. ? ?Patient reports that she has had some ongoing issues with her vision particularly in the left eye.  She states that her vision is blurry at times.  She has recently seen her eye doctor.  She states that he has told her she has dry eyes.  No other pathology has been noted.  Patient is very concerned and does not feel like her vision is normal.  She feels like something is going on.  She reports a history of TIA.   She does report temporal headache at times.  No vision loss.  Patient also reports intermittent chest "heaviness".  She states that this occurs when she takes a deep breath.  She is not having exertional symptoms.  Also reports intermittent palpitations.  She is concerned she may have A-fib. ? ?Additionally, patient states that she would like her labs drawn.  She endorsed to the nurse that she feels tired and has decreased energy.  Patient also informed me that she feels anxious at times.  Would like a refill of alprazolam. ? ?Patient Active Problem List  ? Diagnosis Date Noted  ? Temporal headache 07/15/2021  ? Palpitations 07/15/2021  ? Anxiety 07/15/2021  ? Gastroparesis 10/18/2020  ? Statin myopathy 03/10/2019  ? Mallet deformity of right little finger 06/22/2016  ? Osteoarthritis of finger of right hand 06/20/2016  ? History of colonic polyps 11/26/2015  ? Genital herpes simplex type 2 05/14/2015  ? Hyperlipemia  01/15/2015  ? Pernicious anemia 11/04/2013  ? Osteoporosis 01/25/2013  ? GERD (gastroesophageal reflux disease) 11/16/2012  ? Irritable bowel syndrome 11/16/2012  ? Diastolic dysfunction 27/25/3664  ? TIA (transient ischemic attack) 02/19/2012  ? Hypothyroidism 11/28/2010  ? Hypercholesterolemia 11/28/2010  ? ? ?Social Hx   ?Social History  ? ?Socioeconomic History  ? Marital status: Widowed  ?  Spouse name: Not on file  ? Number of children: 2  ? Years of education: Not on file  ? Highest education level: High school graduate  ?Occupational History  ? Not on file  ?Tobacco Use  ? Smoking status: Never  ? Smokeless tobacco: Never  ?Vaping Use  ? Vaping Use: Never used  ?Substance and Sexual Activity  ? Alcohol use: No  ?  Alcohol/week: 0.0 standard drinks  ? Drug use: No  ? Sexual activity: Not Currently  ?Other Topics Concern  ? Not on file  ?Social History Narrative  ? 12/11/20 lives alone long term boyfriend passed away in 2020/05/24  ? caffeine- one soda daily, coffee, 1/2 cup  ? 2 children  ? High school  ? 3 grandchildren  ? ?Social Determinants of Health  ? ?Financial Resource Strain: Low Risk   ? Difficulty of Paying Living Expenses: Not hard at all  ?Food Insecurity: No Food Insecurity  ? Worried About Charity fundraiser in the Last Year: Never true  ?  Ran Out of Food in the Last Year: Never true  ?Transportation Needs: No Transportation Needs  ? Lack of Transportation (Medical): No  ? Lack of Transportation (Non-Medical): No  ?Physical Activity: Insufficiently Active  ? Days of Exercise per Week: 3 days  ? Minutes of Exercise per Session: 20 min  ?Stress: No Stress Concern Present  ? Feeling of Stress : Only a little  ?Social Connections: Moderately Integrated  ? Frequency of Communication with Friends and Family: More than three times a week  ? Frequency of Social Gatherings with Friends and Family: More than three times a week  ? Attends Religious Services: 1 to 4 times per year  ? Active Member of  Clubs or Organizations: Yes  ? Attends Archivist Meetings: 1 to 4 times per year  ? Marital Status: Widowed  ? ? ?Review of Systems ?Per HPI; also reports memory changes. ? ?Objective:  ?BP 128/79   Pulse 70   Temp (!) 96.6 ?F (35.9 ?C)   Wt 121 lb 6.4 oz (55.1 kg)   SpO2 100%   BMI 22.20 kg/m?  ? ? ?  07/15/2021  ?  9:27 AM 06/03/2021  ?  4:01 PM 04/18/2021  ?  9:07 AM  ?BP/Weight  ?Systolic BP 800 349 179  ?Diastolic BP 79 60 60  ?Wt. (Lbs) 121.4 120.2   ?BMI 22.2 kg/m2 21.98 kg/m2   ? ? ?Physical Exam ?Vitals and nursing note reviewed.  ?Constitutional:   ?   General: She is not in acute distress. ?   Appearance: Normal appearance. She is well-developed.  ?HENT:  ?   Head: Normocephalic and atraumatic.  ?Eyes:  ?   General:     ?   Right eye: No discharge.     ?   Left eye: No discharge.  ?   Conjunctiva/sclera: Conjunctivae normal.  ?Cardiovascular:  ?   Rate and Rhythm: Normal rate and regular rhythm.  ?Pulmonary:  ?   Effort: Pulmonary effort is normal.  ?   Breath sounds: Normal breath sounds. No wheezing, rhonchi or rales.  ?Neurological:  ?   Mental Status: She is alert.  ?Psychiatric:  ?   Comments: Anxious.  ? ? ?Lab Results  ?Component Value Date  ? WBC 6.8 07/30/2020  ? HGB 14.1 07/30/2020  ? HCT 41.7 07/30/2020  ? PLT 232 07/30/2020  ? GLUCOSE 111 (H) 11/19/2020  ? CHOL 168 05/29/2020  ? TRIG 106 05/29/2020  ? HDL 46 05/29/2020  ? LDLCALC 103 (H) 05/29/2020  ? ALT 16 11/19/2020  ? AST 21 11/19/2020  ? NA 134 11/19/2020  ? K 4.3 11/19/2020  ? CL 95 (L) 11/19/2020  ? CREATININE 0.82 11/19/2020  ? BUN 10 11/19/2020  ? CO2 26 11/19/2020  ? TSH 2.920 07/30/2020  ? INR 1.00 07/13/2017  ? HGBA1C 5.3 07/30/2020  ? ? ? ?Assessment & Plan:  ? ?Problem List Items Addressed This Visit   ? ?  ? Endocrine  ? Hypothyroidism  ? Relevant Orders  ? TSH  ?  ? Other  ? Hypercholesterolemia  ? Relevant Orders  ? Lipid panel  ? Temporal headache  ?  Given vision changes and temporal headache, ESR to assess for  the possibility of temporal arteritis. ? ?  ?  ? Relevant Orders  ? Sed Rate (ESR)  ? Palpitations - Primary  ?  Referring to cardiology.  Needs Holter. ? ?  ?  ? Relevant Orders  ? CBC  ?  CMP14+EGFR  ? Ambulatory referral to Cardiology  ? Anxiety  ?  Very anxious.  This is likely contributing to multiple underlying symptoms.  Refilling alprazolam. ? ?  ?  ? ?Other Visit Diagnoses   ? ? Elevated glucose      ? Relevant Orders  ? Hemoglobin A1c  ? ?  ? ?Follow-up: In the next few weeks with Dr. Wolfgang Phoenix ? ?Thersa Salt DO ?[place] ? ?

## 2021-07-16 LAB — CMP14+EGFR
ALT: 15 IU/L (ref 0–32)
AST: 24 IU/L (ref 0–40)
Albumin/Globulin Ratio: 1.7 (ref 1.2–2.2)
Albumin: 4.5 g/dL (ref 3.7–4.7)
Alkaline Phosphatase: 69 IU/L (ref 44–121)
BUN/Creatinine Ratio: 12 (ref 12–28)
BUN: 9 mg/dL (ref 8–27)
Bilirubin Total: 1 mg/dL (ref 0.0–1.2)
CO2: 25 mmol/L (ref 20–29)
Calcium: 9.5 mg/dL (ref 8.7–10.3)
Chloride: 99 mmol/L (ref 96–106)
Creatinine, Ser: 0.75 mg/dL (ref 0.57–1.00)
Globulin, Total: 2.6 g/dL (ref 1.5–4.5)
Glucose: 100 mg/dL — ABNORMAL HIGH (ref 70–99)
Potassium: 4.4 mmol/L (ref 3.5–5.2)
Sodium: 138 mmol/L (ref 134–144)
Total Protein: 7.1 g/dL (ref 6.0–8.5)
eGFR: 83 mL/min/{1.73_m2} (ref >59)

## 2021-07-16 LAB — LIPID PANEL
Chol/HDL Ratio: 3.2 ratio (ref 0.0–4.4)
Cholesterol, Total: 177 mg/dL (ref 100–199)
HDL: 55 mg/dL (ref >39)
LDL Chol Calc (NIH): 107 mg/dL — ABNORMAL HIGH (ref 0–99)
Triglycerides: 78 mg/dL (ref 0–149)
VLDL Cholesterol Cal: 15 mg/dL (ref 5–40)

## 2021-07-16 LAB — TSH: TSH: 1.97 u[IU]/mL (ref 0.450–4.500)

## 2021-07-16 LAB — CBC
Hematocrit: 42.4 % (ref 34.0–46.6)
Hemoglobin: 14.6 g/dL (ref 11.1–15.9)
MCH: 30.5 pg (ref 26.6–33.0)
MCHC: 34.4 g/dL (ref 31.5–35.7)
MCV: 89 fL (ref 79–97)
Platelets: 226 10*3/uL (ref 150–450)
RBC: 4.78 x10E6/uL (ref 3.77–5.28)
RDW: 11.9 % (ref 11.7–15.4)
WBC: 6.3 10*3/uL (ref 3.4–10.8)

## 2021-07-16 LAB — HEMOGLOBIN A1C
Est. average glucose Bld gHb Est-mCnc: 105 mg/dL
Hgb A1c MFr Bld: 5.3 % (ref 4.8–5.6)

## 2021-07-16 LAB — SEDIMENTATION RATE: Sed Rate: 2 mm/hr (ref 0–40)

## 2021-07-17 ENCOUNTER — Ambulatory Visit: Payer: Medicare HMO | Admitting: Nurse Practitioner

## 2021-08-06 ENCOUNTER — Ambulatory Visit: Payer: Medicare HMO | Admitting: Family Medicine

## 2021-08-06 VITALS — BP 140/70 | HR 61 | Temp 98.4°F | Ht 62.0 in | Wt 121.8 lb

## 2021-08-06 DIAGNOSIS — R413 Other amnesia: Secondary | ICD-10-CM | POA: Diagnosis not present

## 2021-08-06 DIAGNOSIS — H538 Other visual disturbances: Secondary | ICD-10-CM | POA: Diagnosis not present

## 2021-08-06 DIAGNOSIS — R002 Palpitations: Secondary | ICD-10-CM | POA: Diagnosis not present

## 2021-08-06 MED ORDER — LEVOTHYROXINE SODIUM 75 MCG PO TABS: ORAL_TABLET | ORAL | 1 refills | Status: AC

## 2021-08-06 NOTE — Progress Notes (Signed)
? ?  Subjective:  ? ? Patient ID: Grace [name], female    DOB: [birth year], 76 y.o.   MRN: [id number] ? ?HPI ? ?Patient here for follow up on palpitations. ? ?Patient says head doesn't feel right, vision is blurred at times. ?Not feeling right ?Vision at times gets blurry  ?Lasts for 2 to 3 mintues ?Feels light headed ?Feels a little woozy ?No unilateral  ?No falls ?Right temple headaches occasional ?Saw eye doctor ?Some hoarseness for 4 days ? ?Review of Systems ? ?   ?Objective:  ? Physical Exam ? ?Finger-to-nose normal Romberg negative lungs clear heart regular immediate recall 3 for 3 after distraction with clock as well as drawing diagram recall is 2 for 3 ? ? ?   ?Assessment & Plan:  ?Currently fine no evidence of stroke ?There is some short-term memory dysfunction but not at the level of dementia ?Patient will be doing follow-up eye exam perhaps getting a second opinion ?If she needs our help setting this up she can let us know ?Labs were reviewed and occluding cholesterol ?Continue current measures ?Refills of thyroid sent in ?Follow-up again in approximately 3 to 4 months ? ?

## 2021-08-08 ENCOUNTER — Telehealth: Payer: Self-pay

## 2021-08-08 NOTE — Telephone Encounter (Signed)
Patient informed of md message. Verbalized understanding.  She would like to get Dr. Bary Leriche advise.  ?

## 2021-08-08 NOTE — Telephone Encounter (Signed)
Patient says she is having a non-productive cough and when blows nose, it's clear. She was in early this week to see Dr.Luking but for different reason. She has been taking allergy medication but doesn't seem to be helping. No fever noted.  She would like for something to be called in because she thinks it's upper respiratory.  Please advise. Thank you ?

## 2021-08-08 NOTE — Telephone Encounter (Signed)
Caller name:Jayliani Bendall ? ?On DPR? :Yes ? ?Call back number:[phone] ? ?Provider they see: Wolfgang Phoenix ? ?Reason for call:Pt calling back she come in for a follow up this past week and got sign now she is congested and cough feels like it is in her chest. Pt is wanting to know if something can be called into [place]  ? ?

## 2021-08-09 NOTE — Telephone Encounter (Signed)
LMTRC

## 2021-08-09 NOTE — Telephone Encounter (Signed)
With this being more of a clear mucus even if it was likely white or lightly yellow more than likely this is a virus I would not recommend giving it a few days if allergy symptoms getting worse may add Astelin nasal spray OTC daily if not significantly improved by Monday or Tuesday please touch base thank you ?

## 2021-08-12 NOTE — Telephone Encounter (Signed)
Patient notified of provider's recommendations and states she is feeling slightly better this morning and will give it today and call back tomorrow for an appt if not feeling better. ?

## 2021-08-14 ENCOUNTER — Encounter: Payer: Self-pay | Admitting: Cardiology

## 2021-08-14 ENCOUNTER — Other Ambulatory Visit: Payer: Self-pay | Admitting: Cardiology

## 2021-08-14 ENCOUNTER — Ambulatory Visit: Payer: Medicare HMO | Admitting: Cardiology

## 2021-08-14 ENCOUNTER — Ambulatory Visit (INDEPENDENT_AMBULATORY_CARE_PROVIDER_SITE_OTHER): Payer: Medicare HMO

## 2021-08-14 VITALS — BP 110/60 | HR 76 | Ht 62.0 in | Wt 121.6 lb

## 2021-08-14 DIAGNOSIS — R002 Palpitations: Secondary | ICD-10-CM

## 2021-08-14 DIAGNOSIS — I341 Nonrheumatic mitral (valve) prolapse: Secondary | ICD-10-CM

## 2021-08-14 NOTE — Patient Instructions (Addendum)
Medication Instructions:  ?Your physician recommends that you continue on your current medications as directed. Please refer to the Current Medication list given to you today. ? ?Labwork: ?none ? ?Testing/Procedures: ?Your physician has requested that you have an echocardiogram. Echocardiography is a painless test that uses sound waves to create images of your heart. It provides your doctor with information about the size and shape of your heart and how well your heart?s chambers and valves are working. This procedure takes approximately one hour. There are no restrictions for this procedure. ?Your physician has recommended that you wear a Zio monitor.  ? ?This monitor is a medical device that records the heart?s electrical activity. Doctors most often use these monitors to diagnose arrhythmias. Arrhythmias are problems with the speed or rhythm of the heartbeat. The monitor is a small device applied to your chest. You can wear one while you do your normal daily activities. While wearing this monitor if you have any symptoms to push the button and record what you felt. Once you have worn this monitor for the period of time provider prescribed (for 7 days), you will return the monitor device in the postage paid box. Once it is returned they will download the data collected and provide [place] with a report which the provider will then review and we will call you with those results. Important tips: ? ?Avoid showering during the first 24 hours of wearing the monitor. ?Avoid excessive sweating to help maximize wear time. ?Do not submerge the device, no hot tubs, and no swimming pools. ?Keep any lotions or oils away from the patch. ?After 24 hours you may shower with the patch on. Take brief showers with your back facing the shower head.  ?Do not remove patch once it has been placed because that will interrupt data and decrease adhesive wear time. ?Push the button when you have any symptoms and write down what you were  feeling. ?Once you have completed wearing your monitor, remove and place into box which has postage paid and place in your outgoing mailbox.  ?If for some reason you have misplaced your box then call our office and we can provide another box and/or mail it off for you. ? ?Follow-Up: ?Your physician recommends that you schedule a follow-up appointment in: pending ? ?Any Other Special Instructions Will Be Listed Below (If Applicable). ? ?If you need a refill on your cardiac medications before your next appointment, please call your pharmacy. ?

## 2021-08-14 NOTE — Progress Notes (Signed)
? ? ?Cardiology Office Note ? ?Date: 08/14/2021  ? ?ID: Grace [name], DOB 1945/08/25, MRN [id number] ? ?PCP:  Kathyrn Drown, MD  ?Cardiologist:  Rozann Lesches, MD ?Electrophysiologist:  None  ? ?Chief Complaint  ?Patient presents with  ? Cardiac follow-up  ? ? ?History of Present Illness: ?Grace [name] is a 76 y.o. female former patient of Dr. Bronson Ing last seen in 2020 now presenting to establish follow-up with me.  I reviewed her records and updated the chart.  She is here today with a close friend. ? ?She presents reporting a recurrent feeling of palpitations and chest tightness.  This has been going on for several months.  No obvious precipitant, can occur at rest just as easily as with activity.  She feels a rapid heartbeat when this happens.  Typically no longer than a few minutes at a time.  She has had no associated syncope. ? ?Previous cardiac work-up was reassuring overall including a normal cardiac catheterization in 2019.  She does carry a diagnosis of mitral valve prolapse.  No prior cardiac arrhythmia based on monitor from 2019. ? ?Recently reviewed her ECG today which shows sinus rhythm with leftward axis, small R prime in V1 and V2 with poor R wave progression. ? ?Past Medical History:  ?Diagnosis Date  ? Dry eyes, bilateral   ? Frequency of urination   ? Heart murmur   ? Hyperlipidemia   ? Hypothyroid   ? Migraine   ? Mitral valve prolapse   ? Skin cancer   ? Stroke [facility])   ? TIA (transient ischemic attack) 02/19/2012  ? ? ?Past Surgical History:  ?Procedure Laterality Date  ? BALLOON DILATION N/A 07/28/2013  ? Procedure: BALLOON DILATION;  Surgeon: Rogene Houston, MD;  [facility];  Service: Endoscopy;  Laterality: N/A;  ? BIOPSY  02/16/2020  ? Procedure: BIOPSY;  Surgeon: Rogene Houston, MD;  [facility];  Service: Endoscopy;;  ? BREAST BIOPSY    ? left benign tumor  ? CATARACT EXTRACTION W/PHACO Right 07/11/2013  ? Procedure: CATARACT EXTRACTION PHACO AND  INTRAOCULAR LENS PLACEMENT (IOC);  Surgeon: Tonny Branch, MD;  Location: [facility];  Service: Ophthalmology;  Laterality: Right;  CDE 9.99  ? CATARACT EXTRACTION W/PHACO Left 08/04/2013  ? Procedure: CATARACT EXTRACTION PHACO AND INTRAOCULAR LENS PLACEMENT (IOC);  Surgeon: Tonny Branch, MD;  Location: [facility];  Service: Ophthalmology;  Laterality: Left;  CDE:16.26  ? CHOLECYSTECTOMY    ? COLONOSCOPY  12/18/2010  ? Procedure: COLONOSCOPY;  Surgeon: Rogene Houston, MD;  [facility];  Service: Endoscopy;  Laterality: N/A;  1:00 pm  ? COLONOSCOPY N/A 03/06/2016  ? Procedure: COLONOSCOPY;  Surgeon: Rogene Houston, MD;  [facility];  Service: Endoscopy;  Laterality: N/A;  1200  ? COLONOSCOPY N/A 04/18/2021  ? Procedure: COLONOSCOPY;  Surgeon: Rogene Houston, MD;  [facility];  Service: Endoscopy;  Laterality: N/A;  805  ? ESOPHAGEAL DILATION N/A 01/27/2018  ? Procedure: ESOPHAGEAL DILATION;  Surgeon: Rogene Houston, MD;  [facility];  Service: Endoscopy;  Laterality: N/A;  ? ESOPHAGEAL DILATION N/A 02/16/2020  ? Procedure: ESOPHAGEAL DILATION;  Surgeon: Rogene Houston, MD;  [facility];  Service: Endoscopy;  Laterality: N/A;  ? ESOPHAGOGASTRODUODENOSCOPY    ? ESOPHAGOGASTRODUODENOSCOPY N/A 07/28/2013  ? Procedure: ESOPHAGOGASTRODUODENOSCOPY (EGD);  Surgeon: Rogene Houston, MD;  [facility];  Service: Endoscopy;  Laterality: N/A;  125  ?  ESOPHAGOGASTRODUODENOSCOPY N/A 01/27/2018  ? Procedure: ESOPHAGOGASTRODUODENOSCOPY (EGD);  Surgeon: Rogene Houston, MD;  [facility];  Service: Endoscopy;  Laterality: N/A;  2:00-moved to 10/30 @ 2:45pm per Lelon Frohlich  ? ESOPHAGOGASTRODUODENOSCOPY N/A 02/16/2020  ? Procedure: ESOPHAGOGASTRODUODENOSCOPY (EGD);  Surgeon: Rogene Houston, MD;  [facility];  Service: Endoscopy;  Laterality: N/A;  10:00  ? LEFT HEART CATH AND CORONARY ANGIOGRAPHY N/A 07/17/2017  ? Procedure: LEFT HEART CATH AND CORONARY  ANGIOGRAPHY;  Surgeon: Sherren Mocha, MD;  Location: [facility];  Service: Cardiovascular;  Laterality: N/A;  ? MALONEY DILATION N/A 07/28/2013  ? Procedure: MALONEY DILATION;  Surgeon: Rogene Houston, MD;  [facility];  Service: Endoscopy;  Laterality: N/A;  ? SAVORY DILATION N/A 07/28/2013  ? Procedure: SAVORY DILATION;  Surgeon: Rogene Houston, MD;  [facility];  Service: Endoscopy;  Laterality: N/A;  ? THYROIDECTOMY  11/20/2011  ? Procedure: THYROIDECTOMY;  Surgeon: Izora Gala, MD;  Location: [facility];  Service: ENT;  Laterality: Left;  LEFT THYROID LOBECTOMY  ? TUBAL LIGATION    ? 38 yrs ago.  ? ? ?Current Outpatient Medications  ?Medication Sig Dispense Refill  ? ALPRAZolam (XANAX) 0.25 MG tablet Take 1/2-1 tablet po BID prn anxiety. CAUTION:DROWSINESS 20 tablet 0  ? aspirin EC 81 MG tablet Take 81 mg by mouth once a week. Swallow whole.    ? Calcium Carb-Cholecalciferol (CALCIUM+D3) 600-800 MG-UNIT TABS Take 1 tablet by mouth daily.    ? Cholecalciferol (VITAMIN D3) 50 MCG (2000 UT) capsule Take 2,000 Units by mouth daily.     ? ezetimibe (ZETIA) 10 MG tablet TAKE ONE TABLET BY MOUTH ONCE DAILY. 90 tablet 1  ? levothyroxine (SYNTHROID) 75 MCG tablet TAKE 1 TABLET TUESDAY TO   SUNDAY AND 1/2 TABLET ON   MONDAY 88 tablet 1  ? Multiple Vitamins-Minerals (ALIVE WOMENS 50+ PO) Take 1 tablet by mouth daily.    ? Tetrahydrozoline HCl (EQ EYE DROPS OP) Apply to eye daily. As needed    ? vitamin B-12 (CYANOCOBALAMIN) 1000 MCG tablet Take 1,000 mcg by mouth daily.    ? Propylene Glycol (SYSTANE BALANCE) 0.6 % SOLN Place 1 drop into both eyes daily as needed (dry eyes). (Patient not taking: Reported on 08/06/2021)    ? ?No current facility-administered medications for this visit.  ? ?Allergies:  Compazine, Statins, and Sulfa drugs cross reactors  ? ?Social History: The patient  reports that she has never smoked. She has never used smokeless tobacco. She reports that she does not drink  alcohol and does not use drugs.  ? ?Family History: The patient's family history includes Cancer in her paternal grandmother; Dementia in her mother; Heart disease in her mother; Hypertension in her mother; Stroke in her maternal grandmother and mother; Thyroid disease in her mother.  ? ?ROS: No orthopnea or PND. ? ?Physical Exam: ?VS:  BP 110/60   Pulse 76   Ht '5\' 2"'$  (1.575 m)   Wt 121 lb 9.6 oz (55.2 kg)   BMI 22.24 kg/m? , BMI Body mass index is 22.24 kg/m?. ? ?Wt Readings from Last 3 Encounters:  ?08/14/21 121 lb 9.6 oz (55.2 kg)  ?08/06/21 121 lb 12.8 oz (55.2 kg)  ?07/15/21 121 lb 6.4 oz (55.1 kg)  ?  ?General: Patient appears comfortable at rest. ?HEENT: Conjunctiva and lids normal, oropharynx clear. ?Neck: Supple, no elevated JVP or carotid bruits, no thyromegaly. ?Lungs: Clear to auscultation, nonlabored breathing at rest. ?Cardiac:  Regular rate and rhythm, no S3 or significant systolic murmur, no pericardial rub. ?Abdomen: Bowel sounds present. ?Extremities: No pitting edema, distal pulses 2+. ?Skin: Warm and dry. ?Musculoskeletal: No kyphosis. ?Neuropsychiatric: Alert and oriented x3, affect grossly appropriate. ? ?ECG:  An ECG dated 07/30/2020 was personally reviewed today and demonstrated:  Sinus rhythm with incomplete right bundle branch block and poor R wave progression. ? ?Recent Labwork: ?07/15/2021: ALT 15; AST 24; BUN 9; Creatinine, Ser 0.75; Hemoglobin 14.6; Platelets 226; Potassium 4.4; Sodium 138; TSH 1.970  ?   ?Component Value Date/Time  ? CHOL 177 07/15/2021 1020  ? TRIG 78 07/15/2021 1020  ? HDL 55 07/15/2021 1020  ? CHOLHDL 3.2 07/15/2021 1020  ? CHOLHDL 3.9 08/24/2019 1604  ? VLDL 35 08/24/2019 1604  ? LDLCALC 107 (H) 07/15/2021 1020  ? ? ?Other Studies Reviewed Today: ? ?Cardiac monitor November 2018: ?Normal sinus rhythm. No arrhythmias. Symptoms corresponded with sinus rhythm. ? ?Cardiac catheterization 07/17/2017: ?1. Angiographically normal coronary arteries ?2. Normal LVEDP ?   ?Suspect noncardiac symptoms ? ?Carotid Dopplers 01/16/2020: ?IMPRESSION: ?Color duplex indicates minimal heterogeneous plaque, with no ?hemodynamically significant stenosis by duplex criteria in the ?extracranial cerebrovascular

## 2021-08-19 ENCOUNTER — Telehealth: Payer: Self-pay | Admitting: Cardiology

## 2021-08-19 NOTE — Telephone Encounter (Signed)
Patient is calling wanting to know when she should take off her heart monitor due to not being sure what day would be considered 7 days. She reports she put it on 05/17 around 3:00-3:30 PM. She also wants to know if she is supposed to write her name on the side of the box where "Name" is listed with a  line. States a VM can be left when calling back if she does not answer.

## 2021-08-19 NOTE — Telephone Encounter (Signed)
Patient made aware that she needs to remove the monitor on 5/24 around 3-3:30 pm and that she just needs to put the monitor in the back of the booklet, seal the box with the blue sticker and put in the mailbox.

## 2021-08-27 ENCOUNTER — Ambulatory Visit (INDEPENDENT_AMBULATORY_CARE_PROVIDER_SITE_OTHER): Payer: Medicare HMO | Admitting: Family Medicine

## 2021-08-27 VITALS — HR 79 | Temp 98.4°F | Wt 122.6 lb

## 2021-08-27 DIAGNOSIS — J069 Acute upper respiratory infection, unspecified: Secondary | ICD-10-CM

## 2021-08-27 MED ORDER — ALBUTEROL SULFATE HFA 108 (90 BASE) MCG/ACT IN AERS: 2.0000 | INHALATION_SPRAY | Freq: Four times a day (QID) | RESPIRATORY_TRACT | 2 refills | Status: DC | PRN

## 2021-08-27 NOTE — Progress Notes (Signed)
   Subjective:    Patient ID: Grace [name], female    DOB: 10-09-1945, 76 y.o.   MRN: [id number]  HPI  Patient having cough, congestion and wheezing. Pt has been sick for past 3-4 days. Wheezing really bad last night. Bronchial congestion Coughing Head congestion Some wheezing at nighttime  Review of Systems     Objective:   Physical Exam  Gen-NAD not toxic TMS-normal bilateral T- normal no redness Chest-CTA respiratory rate normal no crackles CV RRR no murmur Skin-warm dry Neuro-grossly normal       Assessment & Plan:  Viral process Supportive measures discussed No need for any type of intervention currently other than may utilize Robitussin-DM and loratadine Albuterol at nighttime for any wheezing No antibiotics indicated currently COVID test taken

## 2021-08-28 ENCOUNTER — Ambulatory Visit (INDEPENDENT_AMBULATORY_CARE_PROVIDER_SITE_OTHER): Payer: Medicare HMO

## 2021-08-28 DIAGNOSIS — I341 Nonrheumatic mitral (valve) prolapse: Secondary | ICD-10-CM | POA: Diagnosis not present

## 2021-08-28 LAB — ECHOCARDIOGRAM COMPLETE
AV Vena cont: 0.25 cm
Area-P 1/2: 3.6 cm2
Calc EF: 74.6 %
P 1/2 time: 1000 msec
S' Lateral: 2.38 cm
Single Plane A2C EF: 72.6 %
Single Plane A4C EF: 75.9 %

## 2021-08-28 LAB — SPECIMEN STATUS REPORT

## 2021-08-28 LAB — NOVEL CORONAVIRUS, NAA: SARS-CoV-2, NAA: NOT DETECTED

## 2021-09-09 ENCOUNTER — Encounter: Payer: Self-pay | Admitting: Family Medicine

## 2021-09-09 ENCOUNTER — Ambulatory Visit (INDEPENDENT_AMBULATORY_CARE_PROVIDER_SITE_OTHER): Payer: Medicare HMO | Admitting: Family Medicine

## 2021-09-09 ENCOUNTER — Ambulatory Visit: Payer: Medicare HMO | Admitting: Family Medicine

## 2021-09-09 ENCOUNTER — Ambulatory Visit (HOSPITAL_COMMUNITY)
Admission: RE | Admit: 2021-09-09 | Discharge: 2021-09-09 | Disposition: A | Discharge status: Home / Self Care | Payer: Medicare HMO | Source: Ambulatory Visit | Attending: Family Medicine | Admitting: Family Medicine

## 2021-09-09 VITALS — BP 149/74 | HR 69 | Temp 98.6°F | Wt 121.4 lb

## 2021-09-09 DIAGNOSIS — R059 Cough, unspecified: Secondary | ICD-10-CM

## 2021-09-09 DIAGNOSIS — J40 Bronchitis, not specified as acute or chronic: Secondary | ICD-10-CM | POA: Diagnosis not present

## 2021-09-09 MED ORDER — DOXYCYCLINE HYCLATE 100 MG PO TABS: 100.0000 mg | ORAL_TABLET | Freq: Two times a day (BID) | ORAL | 0 refills | Status: DC

## 2021-09-09 NOTE — Progress Notes (Signed)
Subjective:  Patient ID: Grace [name], female    DOB: 04/22/1945  Age: 76 y.o. MRN: [id number]  CC: Chief Complaint  Patient presents with   Chest Congestion    Chest congestion,cough, fatigue-since May 9. Symptoms not improving.     HPI:  76 year old female presents for evaluation of the above.  Patient reports that she has had productive cough and chest congestion for 3 to 4 weeks.  Recently treated with albuterol and an over-the-counter antihistamine.  Has had no improvement.  Continues to cough and cough is quite productive.  She is concerned about the persistence of her symptoms.  No fever.  No relieving factors.  No other associated symptoms.  No other complaints.  Patient Active Problem List   Diagnosis Date Noted   Bronchitis 09/09/2021   Memory dysfunction 08/06/2021   Temporal headache 07/15/2021   Palpitations 07/15/2021   Anxiety 07/15/2021   Gastroparesis 10/18/2020   Statin myopathy 03/10/2019   Mallet deformity of right little finger 06/22/2016   Osteoarthritis of finger of right hand 06/20/2016   History of colonic polyps 11/26/2015   Genital herpes simplex type 2 05/14/2015   Hyperlipemia 01/15/2015   Pernicious anemia 11/04/2013   Osteoporosis 01/25/2013   GERD (gastroesophageal reflux disease) 11/16/2012   Irritable bowel syndrome 54/62/7035   Diastolic dysfunction 00/93/8182   TIA (transient ischemic attack) 02/19/2012   Hypothyroidism 11/28/2010   Hypercholesterolemia 11/28/2010    Social Hx   Social History   Socioeconomic History   Marital status: Widowed    Spouse name: Not on file   Number of children: 2   Years of education: Not on file   Highest education level: High school graduate  Occupational History   Not on file  Tobacco Use   Smoking status: Never   Smokeless tobacco: Never  Vaping Use   Vaping Use: Never used  Substance and Sexual Activity   Alcohol use: No    Alcohol/week: 0.0 standard drinks of alcohol   Drug use: No    Sexual activity: Not Currently  Other Topics Concern   Not on file  Social History Narrative   12/11/20 lives alone long term boyfriend passed away in May 04, 2020   caffeine- one soda daily, coffee, 1/2 cup   2 children   High school   3 grandchildren   Social Determinants of Health   Financial Resource Strain: Low Risk  (01/15/2021)   Overall Financial Resource Strain (CARDIA)    Difficulty of Paying Living Expenses: Not hard at all  Food Insecurity: No Food Insecurity (01/15/2021)   Hunger Vital Sign    Worried About Running Out of Food in the Last Year: Never true    Ran Out of Food in the Last Year: Never true  Transportation Needs: No Transportation Needs (01/15/2021)   PRAPARE - Hydrologist (Medical): No    Lack of Transportation (Non-Medical): No  Physical Activity: Insufficiently Active (01/15/2021)   Exercise Vital Sign    Days of Exercise per Week: 3 days    Minutes of Exercise per Session: 20 min  Stress: No Stress Concern Present (01/15/2021)   [place]    Feeling of Stress : Only a little  Social Connections: Moderately Integrated (01/15/2021)   Social Connection and Isolation Panel [NHANES]    Frequency of Communication with Friends and Family: More than three times a week    Frequency of Social Gatherings with Friends and  Family: More than three times a week    Attends Religious Services: 1 to 4 times per year    Active Member of Clubs or Organizations: Yes    Attends Archivist Meetings: 1 to 4 times per year    Marital Status: Widowed    Review of Systems Per HPI  Objective:  BP (!) 149/74   Pulse 69   Temp 98.6 F (37 C)   Wt 121 lb 6.4 oz (55.1 kg)   SpO2 97%   BMI 22.20 kg/m      09/09/2021   11:22 AM 08/27/2021    3:13 PM 08/14/2021    2:57 PM  BP/Weight  Systolic BP 062  694  Diastolic BP 74  60  Wt. (Lbs) 121.4 122.6 121.6  BMI  22.2 kg/m2 22.42 kg/m2 22.24 kg/m2    Physical Exam Vitals and nursing note reviewed.  Constitutional:      General: She is not in acute distress.    Appearance: Normal appearance.  HENT:     Head: Normocephalic and atraumatic.  Eyes:     General:        Right eye: No discharge.        Left eye: No discharge.     Conjunctiva/sclera: Conjunctivae normal.  Cardiovascular:     Rate and Rhythm: Normal rate and regular rhythm.  Pulmonary:     Effort: Pulmonary effort is normal.     Comments: Coarse breath sounds. Neurological:     Mental Status: She is alert.  Psychiatric:     Comments: Anxious.     Lab Results  Component Value Date   WBC 6.3 07/15/2021   HGB 14.6 07/15/2021   HCT 42.4 07/15/2021   PLT 226 07/15/2021   GLUCOSE 100 (H) 07/15/2021   CHOL 177 07/15/2021   TRIG 78 07/15/2021   HDL 55 07/15/2021   LDLCALC 107 (H) 07/15/2021   ALT 15 07/15/2021   AST 24 07/15/2021   NA 138 07/15/2021   K 4.4 07/15/2021   CL 99 07/15/2021   CREATININE 0.75 07/15/2021   BUN 9 07/15/2021   CO2 25 07/15/2021   TSH 1.970 07/15/2021   INR 1.00 07/13/2017   HGBA1C 5.3 07/15/2021     Assessment & Plan:   Problem List Items Addressed This Visit       Respiratory   Bronchitis - Primary    Chest x-ray was obtained today was independent reviewed by me.  Interpretation: Chest x-ray clear.  No cardiopulmonary abnormalities.  Given duration of symptoms and lack of improvement, treating with doxycycline.       Other Visit Diagnoses     Cough, unspecified type       Relevant Orders   DG Chest 2 View (Completed)       Meds ordered this encounter  Medications   doxycycline (VIBRA-TABS) 100 MG tablet    Sig: Take 1 tablet (100 mg total) by mouth 2 (two) times daily.    Dispense:  14 tablet    Refill:  [place]

## 2021-09-09 NOTE — Assessment & Plan Note (Signed)
Chest x-ray was obtained today was independent reviewed by me.  Interpretation: Chest x-ray clear.  No cardiopulmonary abnormalities.  Given duration of symptoms and lack of improvement, treating with doxycycline.

## 2021-09-09 NOTE — Patient Instructions (Signed)
Chest x-ray at the hospital.  We will call with the results.  Treatment will be provided after I can see the x-ray.  Take care  Dr. Lacinda Axon

## 2021-09-16 ENCOUNTER — Telehealth: Payer: Self-pay

## 2021-09-16 ENCOUNTER — Other Ambulatory Visit: Payer: Self-pay | Admitting: *Deleted

## 2021-09-16 MED ORDER — AMOXICILLIN 500 MG PO CAPS: ORAL_CAPSULE | ORAL | 0 refills | Status: DC

## 2021-09-16 NOTE — Telephone Encounter (Signed)
I would recommend amoxicillin 500 mg 1 taken 3 times daily for the next 7 days if any ongoing troubles please follow-up thank you

## 2021-09-16 NOTE — Telephone Encounter (Signed)
Caller name:Star Stonerock   On DPR? :Yed  Call back [facility]  Provider they see: Lacinda Axon    Reason for call:Pt was seen on June 12 finished the antibiotic doxycycline (VIBRA-TABS) 100 MG tablet  she has a little cough med left she is still congested and coughing

## 2021-09-16 NOTE — Telephone Encounter (Signed)
Patient is having little mucus when coughs, not sure what color because she swallows it. Still having some chest congestion. No fever.  She has been using Equate cough and chest congestion dm. Please advise. Thank yoy

## 2021-09-16 NOTE — Telephone Encounter (Signed)
Thersa Salt G, DO    It should self resolve. No need for further intervention. Send to [facility] as well.

## 2021-09-16 NOTE — Telephone Encounter (Signed)
Nurses More information is needed In regards to the patient stating that she is having congestion and coughing the following please Any fevers?  Is she coughing up much in the way of mucus?  More whitish or clear or yellow or green?  If she is starting to feel somewhat better? Even after treating infections it can take 7 to 14 days for congestion and coughing to totally clear but the above information will help [place] give more detailed advice on what to watch for or if we need to do additional treating thank you

## 2021-09-16 NOTE — Telephone Encounter (Signed)
Patient informed of md message and recommendations. Verbalized understanding.  Medication sent to pharmacy. 

## 2021-09-23 ENCOUNTER — Telehealth: Payer: Self-pay | Admitting: Family Medicine

## 2021-09-23 ENCOUNTER — Ambulatory Visit (INDEPENDENT_AMBULATORY_CARE_PROVIDER_SITE_OTHER): Payer: Medicare HMO | Admitting: Family Medicine

## 2021-09-23 VITALS — BP 133/76 | HR 65 | Temp 98.0°F | Ht 62.0 in | Wt 123.0 lb

## 2021-09-23 DIAGNOSIS — B9689 Other specified bacterial agents as the cause of diseases classified elsewhere: Secondary | ICD-10-CM

## 2021-09-23 DIAGNOSIS — R490 Dysphonia: Secondary | ICD-10-CM

## 2021-09-23 DIAGNOSIS — R053 Chronic cough: Secondary | ICD-10-CM | POA: Diagnosis not present

## 2021-09-23 DIAGNOSIS — J22 Unspecified acute lower respiratory infection: Secondary | ICD-10-CM

## 2021-09-23 DIAGNOSIS — R0609 Other forms of dyspnea: Secondary | ICD-10-CM | POA: Diagnosis not present

## 2021-09-23 DIAGNOSIS — R5383 Other fatigue: Secondary | ICD-10-CM

## 2021-09-23 NOTE — Telephone Encounter (Signed)
Patient calling back stating still having issues with bronchitis no better has been seen twice and been on two rounds of antibiotics. Please advise  [facility]

## 2021-09-24 LAB — COMPREHENSIVE METABOLIC PANEL
ALT: 16 IU/L (ref 0–32)
AST: 25 IU/L (ref 0–40)
Albumin/Globulin Ratio: 1.7 (ref 1.2–2.2)
Albumin: 4.3 g/dL (ref 3.7–4.7)
Alkaline Phosphatase: 67 IU/L (ref 44–121)
BUN/Creatinine Ratio: 11 — ABNORMAL LOW (ref 12–28)
BUN: 9 mg/dL (ref 8–27)
Bilirubin Total: 0.7 mg/dL (ref 0.0–1.2)
CO2: 26 mmol/L (ref 20–29)
Calcium: 9.9 mg/dL (ref 8.7–10.3)
Chloride: 99 mmol/L (ref 96–106)
Creatinine, Ser: 0.82 mg/dL (ref 0.57–1.00)
Globulin, Total: 2.6 g/dL (ref 1.5–4.5)
Glucose: 98 mg/dL (ref 70–99)
Potassium: 4.3 mmol/L (ref 3.5–5.2)
Sodium: 138 mmol/L (ref 134–144)
Total Protein: 6.9 g/dL (ref 6.0–8.5)
eGFR: 75 mL/min/{1.73_m2} (ref >59)

## 2021-09-24 LAB — CBC WITH DIFFERENTIAL/PLATELET
Basophils Absolute: 0.1 10*3/uL (ref 0.0–0.2)
Basos: 1 %
EOS (ABSOLUTE): 0.1 10*3/uL (ref 0.0–0.4)
Eos: 1 %
Hematocrit: 41.2 % (ref 34.0–46.6)
Hemoglobin: 14.3 g/dL (ref 11.1–15.9)
Immature Grans (Abs): 0 10*3/uL (ref 0.0–0.1)
Immature Granulocytes: 0 %
Lymphocytes Absolute: 2 10*3/uL (ref 0.7–3.1)
Lymphs: 27 %
MCH: 31.3 pg (ref 26.6–33.0)
MCHC: 34.7 g/dL (ref 31.5–35.7)
MCV: 90 fL (ref 79–97)
Monocytes Absolute: 0.8 10*3/uL (ref 0.1–0.9)
Monocytes: 10 %
Neutrophils Absolute: 4.4 10*3/uL (ref 1.4–7.0)
Neutrophils: 61 %
Platelets: 218 10*3/uL (ref 150–450)
RBC: 4.57 x10E6/uL (ref 3.77–5.28)
RDW: 12 % (ref 11.7–15.4)
WBC: 7.4 10*3/uL (ref 3.4–10.8)

## 2021-10-10 ENCOUNTER — Telehealth: Payer: Self-pay | Admitting: Family Medicine

## 2021-10-10 NOTE — Telephone Encounter (Signed)
[employer] contacted office and states that pt would like to cancel authorization for CT scan because she is going to a different provider and they have ordered the CT. Attempted to contact patient to let her know that authorization will be done by ordering provider. No answering service available.

## 2021-10-10 NOTE — Addendum Note (Signed)
Addended by: Vicente Males on: 10/10/2021 04:17 PM   Modules accepted: Orders

## 2021-10-10 NOTE — Telephone Encounter (Signed)
Pt is now seeing [facility]. Message sent to referral coordinator to cancel CT.

## 2021-10-15 ENCOUNTER — Other Ambulatory Visit (HOSPITAL_COMMUNITY): Payer: Medicare HMO

## 2021-11-06 ENCOUNTER — Ambulatory Visit: Payer: Self-pay | Admitting: Family Medicine

## 2022-01-03 ENCOUNTER — Telehealth: Payer: Self-pay | Admitting: Family Medicine

## 2022-01-03 NOTE — Telephone Encounter (Signed)
No answer unable to leave a message for patient to call back and schedule Medicare Annual Wellness Visit (AWV).  Please offer to do virtually or by telephone.  Last AWV: 01/15/2021  Please schedule at any time with [facility]Nurse Health Advisor.  30 minute appointment for Virtual or phone  45 minute appointment for initial virtual/phone  Any questions, please contact me at [phone]

## 2022-03-10 ENCOUNTER — Other Ambulatory Visit (HOSPITAL_COMMUNITY): Payer: Self-pay | Admitting: Internal Medicine

## 2022-03-10 DIAGNOSIS — Z1231 Encounter for screening mammogram for malignant neoplasm of breast: Secondary | ICD-10-CM

## 2022-03-20 ENCOUNTER — Ambulatory Visit (HOSPITAL_COMMUNITY)
Admission: RE | Admit: 2022-03-20 | Discharge: 2022-03-20 | Disposition: A | Discharge status: Home / Self Care | Payer: Medicare HMO | Source: Ambulatory Visit | Attending: Internal Medicine | Admitting: Internal Medicine

## 2022-03-20 DIAGNOSIS — Z1231 Encounter for screening mammogram for malignant neoplasm of breast: Secondary | ICD-10-CM | POA: Diagnosis present

## 2022-03-21 ENCOUNTER — Other Ambulatory Visit (HOSPITAL_COMMUNITY): Payer: Self-pay | Admitting: Internal Medicine

## 2022-03-25 ENCOUNTER — Other Ambulatory Visit (HOSPITAL_COMMUNITY): Payer: Self-pay | Admitting: Internal Medicine

## 2022-03-25 DIAGNOSIS — R928 Other abnormal and inconclusive findings on diagnostic imaging of breast: Secondary | ICD-10-CM

## 2022-04-01 ENCOUNTER — Ambulatory Visit (HOSPITAL_COMMUNITY)
Admission: RE | Admit: 2022-04-01 | Discharge: 2022-04-01 | Disposition: A | Discharge status: Home / Self Care | Payer: Medicare HMO | Source: Ambulatory Visit | Attending: Internal Medicine | Admitting: Internal Medicine

## 2022-04-01 ENCOUNTER — Encounter (HOSPITAL_COMMUNITY): Payer: Self-pay

## 2022-04-01 DIAGNOSIS — R928 Other abnormal and inconclusive findings on diagnostic imaging of breast: Secondary | ICD-10-CM | POA: Diagnosis present

## 2022-04-02 ENCOUNTER — Other Ambulatory Visit (HOSPITAL_COMMUNITY): Payer: Self-pay | Admitting: Internal Medicine

## 2022-04-02 DIAGNOSIS — R928 Other abnormal and inconclusive findings on diagnostic imaging of breast: Secondary | ICD-10-CM

## 2022-04-02 DIAGNOSIS — R921 Mammographic calcification found on diagnostic imaging of breast: Secondary | ICD-10-CM

## 2022-04-09 ENCOUNTER — Ambulatory Visit
Admission: RE | Admit: 2022-04-09 | Discharge: 2022-04-09 | Disposition: A | Discharge status: Home / Self Care | Payer: Medicare HMO | Source: Ambulatory Visit | Attending: Internal Medicine | Admitting: Internal Medicine

## 2022-04-09 DIAGNOSIS — R921 Mammographic calcification found on diagnostic imaging of breast: Secondary | ICD-10-CM

## 2022-04-09 DIAGNOSIS — R928 Other abnormal and inconclusive findings on diagnostic imaging of breast: Secondary | ICD-10-CM

## 2022-04-09 HISTORY — PX: BREAST BIOPSY: SHX20

## 2022-05-12 ENCOUNTER — Encounter (INDEPENDENT_AMBULATORY_CARE_PROVIDER_SITE_OTHER): Payer: Self-pay | Admitting: *Deleted

## 2022-06-12 ENCOUNTER — Ambulatory Visit (INDEPENDENT_AMBULATORY_CARE_PROVIDER_SITE_OTHER): Payer: Medicare HMO | Admitting: Gastroenterology

## 2022-06-12 ENCOUNTER — Encounter (INDEPENDENT_AMBULATORY_CARE_PROVIDER_SITE_OTHER): Payer: Self-pay | Admitting: Gastroenterology

## 2022-06-12 VITALS — BP 118/65 | HR 79 | Temp 97.8°F | Ht 63.0 in | Wt 122.9 lb

## 2022-06-12 DIAGNOSIS — K3184 Gastroparesis: Secondary | ICD-10-CM

## 2022-06-12 DIAGNOSIS — K581 Irritable bowel syndrome with constipation: Secondary | ICD-10-CM | POA: Diagnosis not present

## 2022-06-12 NOTE — Progress Notes (Signed)
Maylon Peppers, M.D. Gastroenterology & Hepatology [facility] [address] [place], [place] [address]  Primary Care Physician: Neale Burly, MD [place] [phone]  I will communicate my assessment and recommendations to the referring MD via EMR.  Problems: Gastroparesis Chronic constipation  History of Present Illness: SHERRIN LANO is a 77 y.o. female with past medical history of chronic constipation, gastroparesis, heart failure with diastolic dysfunction, hyperlipidemia, hypothyroidism, stroke,, mitral prolapse, who presents for follow up of gastroparesis and constipation.   The patient was last seen on 10/18/2020. At that time, the patient was advised to continue the recommendations for gastroparesis.  She was given information to start domperidone 10 mg 3 times daily but she was asked to reach pharmacy at [place].  She was also advised to continue MiraLAX on a regular basis for management of constipation  States she is feeling the same as in our previous appointment. Patient does not remember our previous discussion regarding domperidone. She feels fullness since she wakes up and has to force herself to keep her weight adequate. Denies any nausea or vomiting. She feels very bloated frequently.  She is having small pebbles when defecating, which is happening once a week. Occasionally she takes one capful of Miralax intermittently, but does not like to take it often as she would have some urgency and one watery BM.  The patient was on Reglan 5 mg 3 times daily in the past for gastroparesis, she took it for close to 3 months but did not feel any improvement so she stopped taking it.   The patient denies having any nausea, vomiting, fever, chills, hematochezia, melena, hematemesis, diarrhea, jaundice, pruritus or weight loss.  Gastric emptying study: 11/2019  12% emptied at 1 hr ( normal >= 10%) 17% emptied at 2 hr (  normal >= 40%) 21% emptied at 3 hr ( normal >= 70%) 25% emptied at 4 hr ( normal >= 90%) IMPRESSION: Significantly delayed gastric emptying study.   Last EGD: 01/2020 The hypopharynx was normal. The examined esophagus was normal. The Z-line was irregular and was found 36 cm from the incisors. No endoscopic abnormality was evident in the esophagus to explain the patient's complaint of dysphagia. It was decided, however, to proceed with dilation of the entire esophagus. The scope was withdrawn. Dilation was performed with a Maloney dilator with mild resistance at 45 Fr. The dilation site was examined following endoscope reinsertion and showed no change and no bleeding, mucosal tear or perforation. A few small sessile polyps were found in the gastric body. Diffuse mild inflammation characterized by congestion (edema), erythema and friability was found in the gastric body and in the gastric antrum. Biopsies were taken with a cold forceps for histology. The pylorus was normal. The duodenal bulb and second portion of the duodenum were normal.   SURGICAL PATHOLOGY  A. STOMACH, ANTRUM, BIOPSY:  - Mild reactive gastropathy.  - Warthin-Starry special stain is negative for Helicobacter pylori.   B. STOMACH, BODY, BIOPSY:  - Chronic gastritis and focal intestinal metaplasia, negative for  dysplasia.  - Helicobacter pylori immunostain is negative for Helicobacter pylori.    Last Colonoscopy: 03/2021, performed by Dr. Laural Golden Normal colon, external hemorrhoids.  Past Medical History: Past Medical History:  Diagnosis Date   Dry eyes, bilateral    Frequency of urination    Heart murmur    Hyperlipidemia    Hypothyroid    Migraine    Mitral valve prolapse  Skin cancer    Stroke [facility])    TIA (transient ischemic attack) 02/19/2012    Past Surgical History: Past Surgical History:  Procedure Laterality Date   BALLOON DILATION N/A 07/28/2013   Procedure: BALLOON DILATION;  Surgeon: Rogene Houston, MD;  [facility];  Service: Endoscopy;  Laterality: N/A;   BIOPSY  02/16/2020   Procedure: BIOPSY;  Surgeon: Rogene Houston, MD;  [facility];  Service: Endoscopy;;   BREAST BIOPSY     left benign tumor   BREAST BIOPSY Left 04/09/2022   MM LT BREAST BX W LOC DEV 1ST LESION IMAGE BX SPEC STEREO GUIDE 04/09/2022 [facility]   CATARACT EXTRACTION W/PHACO Right 07/11/2013   Procedure: CATARACT EXTRACTION PHACO AND INTRAOCULAR LENS PLACEMENT ([place]);  Surgeon: Tonny Branch, MD;  Location: [facility];  Service: Ophthalmology;  Laterality: Right;  CDE 9.99   CATARACT EXTRACTION W/PHACO Left 08/04/2013   Procedure: CATARACT EXTRACTION PHACO AND INTRAOCULAR LENS PLACEMENT ([facility]);  Surgeon: Tonny Branch, MD;  Location: [facility];  Service: Ophthalmology;  Laterality: Left;  CDE:16.26   CHOLECYSTECTOMY     COLONOSCOPY  12/18/2010   Procedure: COLONOSCOPY;  Surgeon: Rogene Houston, MD;  [facility];  Service: Endoscopy;  Laterality: N/A;  1:00 pm   COLONOSCOPY N/A 03/06/2016   Procedure: COLONOSCOPY;  Surgeon: Rogene Houston, MD;  [facility];  Service: Endoscopy;  Laterality: N/A;  1200   COLONOSCOPY N/A 04/18/2021   Procedure: COLONOSCOPY;  Surgeon: Rogene Houston, MD;  [facility];  Service: Endoscopy;  Laterality: N/A;  805   ESOPHAGEAL DILATION N/A 01/27/2018   Procedure: ESOPHAGEAL DILATION;  Surgeon: Rogene Houston, MD;  [facility];  Service: Endoscopy;  Laterality: N/A;   ESOPHAGEAL DILATION N/A 02/16/2020   Procedure: ESOPHAGEAL DILATION;  Surgeon: Rogene Houston, MD;  [facility];  Service: Endoscopy;  Laterality: N/A;   ESOPHAGOGASTRODUODENOSCOPY     ESOPHAGOGASTRODUODENOSCOPY N/A 07/28/2013   Procedure: ESOPHAGOGASTRODUODENOSCOPY (EGD);  Surgeon: Rogene Houston, MD;  [facility];  Service: Endoscopy;  Laterality: N/A;  125   ESOPHAGOGASTRODUODENOSCOPY N/A 01/27/2018   Procedure:  ESOPHAGOGASTRODUODENOSCOPY (EGD);  Surgeon: Rogene Houston, MD;  [facility];  Service: Endoscopy;  Laterality: N/A;  2:00-moved to 10/30 @ 2:45pm per Ann   ESOPHAGOGASTRODUODENOSCOPY N/A 02/16/2020   Procedure: ESOPHAGOGASTRODUODENOSCOPY (EGD);  Surgeon: Rogene Houston, MD;  [facility];  Service: Endoscopy;  Laterality: N/A;  10:00   LEFT HEART CATH AND CORONARY ANGIOGRAPHY N/A 07/17/2017   Procedure: LEFT HEART CATH AND CORONARY ANGIOGRAPHY;  Surgeon: Sherren Mocha, MD;  Location: [facility];  Service: Cardiovascular;  Laterality: N/A;   MALONEY DILATION N/A 07/28/2013   Procedure: Venia Minks DILATION;  Surgeon: Rogene Houston, MD;  [facility];  Service: Endoscopy;  Laterality: N/A;   SAVORY DILATION N/A 07/28/2013   Procedure: SAVORY DILATION;  Surgeon: Rogene Houston, MD;  [facility];  Service: Endoscopy;  Laterality: N/A;   THYROIDECTOMY  11/20/2011   Procedure: THYROIDECTOMY;  Surgeon: Izora Gala, MD;  Location: [facility];  Service: ENT;  Laterality: Left;  LEFT THYROID LOBECTOMY   TUBAL LIGATION     39 yrs ago.    Family History: Family History  Problem Relation Age of Onset   Heart disease Mother    Hypertension Mother    Stroke Mother    Thyroid disease Mother  Dementia Mother    Stroke Maternal Grandmother    Cancer Paternal Grandmother        breast   Colon cancer Neg Hx     Social History: Social History   Tobacco Use  Smoking Status Never  Smokeless Tobacco Never   Social History   Substance and Sexual Activity  Alcohol Use No   Alcohol/week: 0.0 standard drinks of alcohol   Social History   Substance and Sexual Activity  Drug Use No    Allergies: Allergies  Allergen Reactions   Compazine Other (See Comments)    Slurred speech   Statins Other (See Comments)    myalgias   Sulfa Drugs Cross Reactors Rash    Medications: Current Outpatient Medications  Medication Sig Dispense Refill    albuterol (VENTOLIN HFA) 108 (90 Base) MCG/ACT inhaler Inhale 2 puffs into the lungs every 6 (six) hours as needed for wheezing. 1 each 2   ALPRAZolam (XANAX) 0.25 MG tablet Take 1/2-1 tablet po BID prn anxiety. CAUTION:DROWSINESS 20 tablet 0   aspirin EC 81 MG tablet Take 81 mg by mouth once a week. Swallow whole.     ezetimibe (ZETIA) 10 MG tablet TAKE ONE TABLET BY MOUTH ONCE DAILY. 90 tablet 1   levothyroxine (SYNTHROID) 75 MCG tablet TAKE 1 TABLET TUESDAY TO   SUNDAY AND 1/2 TABLET ON   MONDAY (Patient taking differently: TAKE 1 TABLET TUESDAY THROUGH  SUNDAY AND 1/2 TABLET ON   MONDAY) 88 tablet 1   Multiple Vitamins-Minerals (ALIVE WOMENS 50+ PO) Take 1 tablet by mouth daily.     Propylene Glycol (SYSTANE BALANCE) 0.6 % SOLN Place 1 drop into both eyes daily as needed (dry eyes).     No current facility-administered medications for this visit.    Review of Systems: GENERAL: negative for malaise, night sweats HEENT: No changes in hearing or vision, no nose bleeds or other nasal problems. NECK: Negative for lumps, goiter, pain and significant neck swelling RESPIRATORY: Negative for cough, wheezing CARDIOVASCULAR: Negative for chest pain, leg swelling, palpitations, orthopnea GI: SEE HPI MUSCULOSKELETAL: Negative for joint pain or swelling, back pain, and muscle pain. SKIN: Negative for lesions, rash PSYCH: Negative for sleep disturbance, mood disorder and recent psychosocial stressors. HEMATOLOGY Negative for prolonged bleeding, bruising easily, and swollen nodes. ENDOCRINE: Negative for cold or heat intolerance, polyuria, polydipsia and goiter. NEURO: negative for tremor, gait imbalance, syncope and seizures. The remainder of the review of systems is noncontributory.   Physical Exam: BP 118/65 (BP Location: Left Arm, Patient Position: Sitting, Cuff Size: Normal)   Pulse 79   Temp 97.8 F (36.6 C) (Temporal)   Ht '5\' 3"'$  (1.6 m)   Wt 122 lb 14.4 oz (55.7 kg)   BMI 21.77 kg/m   GENERAL: The patient is AO x3, in no acute distress. HEENT: Head is normocephalic and atraumatic. EOMI are intact. Mouth is well hydrated and without lesions. NECK: Supple. No masses LUNGS: Clear to auscultation. No presence of rhonchi/wheezing/rales. Adequate chest expansion HEART: RRR, normal s1 and s2. ABDOMEN: Soft, nontender, no guarding, no peritoneal signs, and nondistended. BS +. No masses. EXTREMITIES: Without any cyanosis, clubbing, rash, lesions or edema. NEUROLOGIC: AOx3, no focal motor deficit. SKIN: no jaundice, no rashes  Imaging/Labs: as above  I personally reviewed and interpreted the available labs, imaging and endoscopic files.  Impression and Plan: GLENDINE KEMERER is a 77 y.o. female with past medical history of chronic constipation, gastroparesis, heart failure with diastolic dysfunction, hyperlipidemia, hypothyroidism, stroke,,  mitral prolapse, who presents for follow up of gastroparesis and constipation.   Regarding her gastroparesis, the patient has had minimal improvement of her symptoms with dietary changes.  She does not remember our discussion about domperidone in her previous appointment.  We discussed again the pharmacologic options for management of gastroparesis which include metoclopramide, domperidone and erythromycin. Patient did not have improvement with low dose Reglan and is not interested in trying a higher dose given concern for side effects.  She is interested in trying domperidone 5 mg 3 times daily for management of gastroparesis -information was provided to ordered the medication from outside of the [place].  She should continue with the dietary changes she has implemented.  In terms of her constipation, she has been taking MiraLAX as needed.  I advised her she should be taking his medication on a regular basis and uptitrate as needed.  If this does not achieve significant symptom control, may consider starting on a prescription laxatives for  IBS-C as I suspect she has generalized bowel dysmotility..  All questions were answered.      Maylon Peppers, MD Gastroenterology and Hepatology [facility]

## 2022-06-12 NOTE — Patient Instructions (Addendum)
Reach pharmacy outside of the [place] and order domperidone 5 mg every 8 hours. Start taking Miralax 1 capful every day. Can increase to 2 to 3 capfuls per day depending on constipation relief. Eat small portions 5-6 times per day.

## 2022-06-13 ENCOUNTER — Encounter (INDEPENDENT_AMBULATORY_CARE_PROVIDER_SITE_OTHER): Payer: Self-pay | Admitting: *Deleted

## 2022-09-19 ENCOUNTER — Encounter: Payer: Self-pay | Admitting: Student

## 2022-09-19 ENCOUNTER — Ambulatory Visit: Payer: Medicare HMO | Attending: Nurse Practitioner | Admitting: Student

## 2022-09-19 VITALS — BP 132/70 | HR 65 | Ht 63.0 in | Wt 123.8 lb

## 2022-09-19 DIAGNOSIS — R0609 Other forms of dyspnea: Secondary | ICD-10-CM

## 2022-09-19 DIAGNOSIS — I351 Nonrheumatic aortic (valve) insufficiency: Secondary | ICD-10-CM

## 2022-09-19 DIAGNOSIS — E782 Mixed hyperlipidemia: Secondary | ICD-10-CM

## 2022-09-19 DIAGNOSIS — R911 Solitary pulmonary nodule: Secondary | ICD-10-CM

## 2022-09-19 DIAGNOSIS — R002 Palpitations: Secondary | ICD-10-CM

## 2022-09-19 NOTE — Patient Instructions (Signed)
Medication Instructions:  Your physician recommends that you continue on your current medications as directed. Please refer to the Current Medication list given to you today.  *If you need a refill on your cardiac medications before your next appointment, please call your pharmacy*   Lab Work: None If you have labs (blood work) drawn today and your tests are completely normal, you will receive your results only by: MyChart Message (if you have MyChart) OR A paper copy in the mail If you have any lab test that is abnormal or we need to change your treatment, we will call you to review the results.   Testing/Procedures: Your physician has requested that you have an echocardiogram. Echocardiography is a painless test that uses sound waves to create images of your heart. It provides your doctor with information about the size and shape of your heart and how well your heart's chambers and valves are working. This procedure takes approximately one hour. There are no restrictions for this procedure. Please do NOT wear cologne, perfume, aftershave, or lotions (deodorant is allowed). Please arrive 15 minutes prior to your appointment time.   Non-Contrasted Chest CT  Follow-Up: At [facility], you and your health needs are our priority.  As part of our continuing mission to provide you with exceptional heart care, we have created designated Provider Care Teams.  These Care Teams include your primary Cardiologist (physician) and Advanced Practice Providers (APPs -  Physician Assistants and Nurse Practitioners) who all work together to provide you with the care you need, when you need it.  We recommend signing up for the patient portal called "MyChart".  Sign up information is provided on this After Visit Summary.  MyChart is used to connect with patients for Virtual Visits (Telemedicine).  Patients are able to view lab/test results, encounter notes, upcoming appointments, etc.  Non-urgent  messages can be sent to your provider as well.   To learn more about what you can do with MyChart, go to [email or web address].    Your next appointment:   6 month(s)  Provider:   Nona Dell, MD    Other Instructions

## 2022-09-19 NOTE — Progress Notes (Signed)
Cardiology Office Note    Date:  09/19/2022  ID:  Grace [name], DOB [birth year], MRN [id number] Cardiologist: Nona Dell, MD    History of Present Illness:    Grace [name] is a 77 y.o. female with past medical history of normal coronary arteries by cardiac catheterization in 2019, HLD, hypothyroidism, migraine headaches and prior CVA who presents to the office today for evaluation of shortness of breath.  She was last examined by Dr. Diona Browner in 07/2021 and reported occasional episodes of palpitations and chest tightness which had been intermittent over the past few months. A follow-up echocardiogram and Zio patch were recommended for further evaluation. Her echocardiogram showed a preserved EF of 65 to 70% with no regional wall motion abnormalities. She did have Grade 1 DD, normal RV function, normal PASP, trivial MR and mild AI. Event monitor showed predominantly normal sinus rhythm with rare PAC's and PVC's representing less than 1% of total beats. She did have 1 episode of SVT for 18 beats but was asymptomatic with this. It was recommended to consider trying Toprol-XL 12.5 mg daily but she declined at that time.  In talking with the patient and her friend today, she reports having worsening dyspnea at rest and with activity along with fatigue for the past several months. Unsure of any specific culprit but feels like symptoms progressed after having COVID-19. Reports symptoms are sporadic but she notices this when doing activities around the house or when they go dancing. Also notes she becomes hoarse at times. Says she does have occasional tightness in her chest with this. Reports occasional palpitations but previously did not prefer to be on Toprol-XL and she again prefers to avoid medical therapy. No specific orthopnea or PND. She has experienced intermittent lower extremity edema but has not required diuretic therapy.  Studies Reviewed:   EKG: EKG is ordered today and demonstrates NSR,  HR 65 with LAD. No acute changes.   LHC: 06/2017 1. Angiographically normal coronary arteries 2. Normal LVEDP   Suspect noncardiac symptoms  Echocardiogram: 07/2021 IMPRESSIONS     1. Left ventricular ejection fraction, by estimation, is 65 to 70%. The  left ventricle has normal function. The left ventricle has no regional  wall motion abnormalities. Left ventricular diastolic parameters are  consistent with Grade I diastolic  dysfunction (impaired relaxation). The average left ventricular global  longitudinal strain is -20.2 %. The global longitudinal strain is normal.   2. Right ventricular systolic function is normal. The right ventricular  size is normal. There is normal pulmonary artery systolic pressure. The  estimated right ventricular systolic pressure is 20.6 mmHg.   3. The mitral valve is grossly normal. Trivial mitral valve  regurgitation.   4. The aortic valve is tricuspid. Aortic valve regurgitation is mild.  Aortic regurgitation PHT measures 1000 msec.   5. The inferior vena cava is normal in size with greater than 50%  respiratory variability, suggesting right atrial pressure of 3 mmHg.   Comparison(s): Prior images unable to be directly viewed.   Event Monitor: 08/2021 ZIO XT reviewed.  6 days, 21 hours analyzed.   1.  Predominant rhythm is sinus with heart rate ranging from 44 bpm up to 144 bpm and average heart rate 66 bpm. 2.  There were rare PACs including atrial couplets representing less than 1% total beats.  There were also rare PVCs including ventricular couplets representing less than 1% total beats. 3.  Single episode of SVT noted lasting 18 beats with average  heart rate in the 130s. 4.  Patient triggered episodes loosely correlated with PACs, did not correspond with episode of SVT. 5.  There were no pauses.  Chest CT: 09/2021 1. Mild right and left lower lobe bronchiolectasis with scarring in  the dependent right lower lobe.  2. Multiple small  pulmonary nodules in the right lower lobe and left  upper lobe, largest measuring 5 mm. The majority of these are stable  from prior exam, although the superior most nodules were not  previously included in the abdominal CT field of view. Per  Fleischner Society Guidelines, if patient is low risk for  malignancy, no routine follow-up imaging is recommended; if patient  is high risk for malignancy, a non-contrast Chest CT at 12 months is  optional. If performed and the nodule is stable at 12 months, no  further follow-up is recommended.  These guidelines do not apply to immunocompromised patients and  patients with cancer. Follow up in patients with significant  comorbidities as clinically warranted. For lung cancer screening,  adhere to Lung-RADS guidelines.    Physical Exam:   VS:  BP 132/70   Pulse 65   Ht 5\' 3"  (1.6 m)   Wt 123 lb 12.8 oz (56.2 kg)   SpO2 99%   BMI 21.93 kg/m    Wt Readings from Last 3 Encounters:  09/19/22 123 lb 12.8 oz (56.2 kg)  06/12/22 122 lb 14.4 oz (55.7 kg)  09/23/21 123 lb (55.8 kg)     GEN: Well nourished, well developed female appearing in no acute distress NECK: No JVD; No carotid bruits CARDIAC: RRR, soft murmur along RUSB.  RESPIRATORY:  Clear to auscultation without rales, wheezing or rhonchi  ABDOMEN: Appears non-distended. No obvious abdominal masses. EXTREMITIES: No clubbing or cyanosis. No pitting edema.  Distal pedal pulses are 2+ bilaterally.   Assessment and Plan:   1. Dyspnea on Exertion/Pulmonary Nodules/Aortic Regurgitation - By review of [facility] DXA, her proBNP, hemoglobin, electrolytes and thyroid function were normal when checked on 09/08/2022. The patient and her friend are worried that her aortic regurgitation has progressed and could be contributing to her symptoms and we reviewed it would be reasonable to obtain a follow-up echocardiogram for reassessment of this and to ensure no structural changes or cardiomyopathy given  her prior COVID-19 diagnosis. - She did have a Chest CT in 09/2021 which showed mild right and left lower lobe bronchiolectasis with scarring in the right lower lobe along with multiple small pulmonary nodules with follow-up imaging recommended in 1 year. Given her symptoms, will plan to obtain a follow-up non-contrast Chest CT. Pending echocardiogram and Chest CT results, she may benefit from referral to Pulmonology for additional evaluation. Pending results, we can also consider a Coronary CTA for repeat ischemic evaluation but she did have normal coronary arteries by cardiac catheterization in 2019 and was reporting similar symptoms at that time by review of notes.  2. Palpitations - Prior monitor showed rare PAC's and PVC's with brief SVT as outlined above. She still experiences occasional palpitations but prefers to avoid medical therapy. We reviewed the importance of reducing caffeine intake. I encouraged her to make [place] aware if symptoms progress or she feels she would prefer to be on medications for this.  3. HLD - Followed by her PCP. She has remained on Zetia 10 mg daily as she previously had myalgias with statin therapy.  Signed, Ellsworth Lennox, PA-C

## 2022-10-01 ENCOUNTER — Ambulatory Visit: Payer: Medicare HMO | Admitting: Nurse Practitioner

## 2022-10-09 ENCOUNTER — Ambulatory Visit (HOSPITAL_COMMUNITY)
Admission: RE | Admit: 2022-10-09 | Discharge: 2022-10-09 | Disposition: A | Discharge status: Home / Self Care | Payer: Medicare HMO | Source: Ambulatory Visit | Attending: Student | Admitting: Student

## 2022-10-09 DIAGNOSIS — R0609 Other forms of dyspnea: Secondary | ICD-10-CM | POA: Insufficient documentation

## 2022-10-09 DIAGNOSIS — R911 Solitary pulmonary nodule: Secondary | ICD-10-CM | POA: Diagnosis present

## 2022-10-23 ENCOUNTER — Encounter (INDEPENDENT_AMBULATORY_CARE_PROVIDER_SITE_OTHER): Payer: Self-pay | Admitting: Gastroenterology

## 2022-10-23 ENCOUNTER — Ambulatory Visit (INDEPENDENT_AMBULATORY_CARE_PROVIDER_SITE_OTHER): Payer: Medicare HMO | Admitting: Gastroenterology

## 2022-10-23 VITALS — BP 138/67 | HR 65 | Temp 98.2°F | Ht 63.0 in | Wt 124.3 lb

## 2022-10-23 DIAGNOSIS — K581 Irritable bowel syndrome with constipation: Secondary | ICD-10-CM | POA: Diagnosis not present

## 2022-10-23 MED ORDER — PEG 3350-KCL-NA BICARB-NACL 420 G PO SOLR: 4000.0000 mL | Freq: Once | ORAL | 0 refills | Status: AC

## 2022-10-23 NOTE — Patient Instructions (Signed)
I have sent Bowel prep  to the pharmacy for you to take to get you cleaned out Once bowel prep is done, please Start taking Miralax 1 capful every day for one week. If bowel movements do not improve, increase to 1 capful every 12 hours. If after two weeks there is no improvement, increase to 1 capful every 8 hours Continue with good water intake Diet high in fruits, veggies, whole grains, kiwi and prunes are especially   Follow up 3 months

## 2022-10-23 NOTE — Progress Notes (Addendum)
Referring Provider: Toma Deiters, MD Primary Care Physician:  Toma Deiters, MD Primary GI Physician: Levon Hedger   Chief Complaint  Patient presents with   Constipation    Follow up on constipation. Has tried dulocolax, laxatives, miralax and enemas. Since taking enema has had little bits of stools. Having abdominal cramping and nausea.    HPI:   Grace [name] is a 77 y.o. female with past medical history of chronic constipation, gastroparesis, heart failure with diastolic dysfunction, hyperlipidemia, hypothyroidism, stroke,, mitral prolapse,   Patient presenting today for follow up of constipation  Last seen march 2024, at that time feeling same as previously, did not get domperidone as recommended previously. Wakes up feeling full. Having small pebbles when defecating once weekly, doing miralax intermittently but cannot tolerate more often due to watery BMs. No improvement previously with Reglan for gastroparesis.   Recommended to obtain domperidone, take miralax regularly, consider prescription strength laxative if not improving  Present:  States she is still having constipation, she tried miralax daily for about a week without much improvement, she was doing maybe 1t of miralax, she never increased to a higher dose. She had to do an enema a few days ago which helped some but she did not have a large volume of stool passage, notes only small amounts of stool particles passing. She has tried a stool softener/laxative combo as well which worked initially but then she became constipated even while taking it. She can go up to 1 week without a BM. Tries to drink water throughout the day. Feels bloated as she is not moving her bowels well. She denies rectal bleeding or melena. Denies weight loss. Appetite is not great, she notes she is not very hungry but tries to make herself eat.   Gastric emptying study: 11/2019  12% emptied at 1 hr ( normal >= 10%) 17% emptied at 2 hr ( normal >=  40%) 21% emptied at 3 hr ( normal >= 70%) 25% emptied at 4 hr ( normal >= 90%) IMPRESSION: Significantly delayed gastric emptying study.   Last EGD: 01/2020 The hypopharynx was normal. The examined esophagus was normal. The Z-line was irregular and was found 36 cm from the incisors. No endoscopic abnormality was evident in the esophagus to explain the patient's complaint of dysphagia. It was decided, however, to proceed with dilation of the entire esophagus. The scope was withdrawn. Dilation was performed with a Maloney dilator with mild resistance at 54 Fr. The dilation site was examined following endoscope reinsertion and showed no change and no bleeding, mucosal tear or perforation. A few small sessile polyps were found in the gastric body. Diffuse mild inflammation characterized by congestion (edema), erythema and friability was found in the gastric body and in the gastric antrum. Biopsies were taken with a cold forceps for histology. The pylorus was normal. The duodenal bulb and second portion of the duodenum were normal.   SURGICAL PATHOLOGY  A. STOMACH, ANTRUM, BIOPSY:  - Mild reactive gastropathy.  - Warthin-Starry special stain is negative for Helicobacter pylori.   B. STOMACH, BODY, BIOPSY:  - Chronic gastritis and focal intestinal metaplasia, negative for  dysplasia.  - Helicobacter pylori immunostain is negative for Helicobacter pylori.    Last Colonoscopy: 03/2021, performed by Dr. Karilyn Cota Normal colon, external hemorrhoids.    Past Medical History:  Diagnosis Date   Dry eyes, bilateral    Frequency of urination    Heart murmur    Hyperlipidemia    Hypothyroid  Migraine    Mitral valve prolapse    Skin cancer    Stroke [facility])    TIA (transient ischemic attack) 02/19/2012    Past Surgical History:  Procedure Laterality Date   BALLOON DILATION N/A 07/28/2013   Procedure: BALLOON DILATION;  Surgeon: Malissa Hippo, MD;  [facility];  Service:  Endoscopy;  Laterality: N/A;   BIOPSY  02/16/2020   Procedure: BIOPSY;  Surgeon: Malissa Hippo, MD;  [facility];  Service: Endoscopy;;   BREAST BIOPSY     left benign tumor   BREAST BIOPSY Left 04/09/2022   MM LT BREAST BX W LOC DEV 1ST LESION IMAGE BX SPEC STEREO GUIDE 04/09/2022 [facility]   CATARACT EXTRACTION W/PHACO Right 07/11/2013   Procedure: CATARACT EXTRACTION PHACO AND INTRAOCULAR LENS PLACEMENT (IOC);  Surgeon: Gemma Payor, MD;  Location: [facility];  Service: Ophthalmology;  Laterality: Right;  CDE 9.99   CATARACT EXTRACTION W/PHACO Left 08/04/2013   Procedure: CATARACT EXTRACTION PHACO AND INTRAOCULAR LENS PLACEMENT (IOC);  Surgeon: Gemma Payor, MD;  Location: [facility];  Service: Ophthalmology;  Laterality: Left;  CDE:16.26   CHOLECYSTECTOMY     COLONOSCOPY  12/18/2010   Procedure: COLONOSCOPY;  Surgeon: Malissa Hippo, MD;  [facility];  Service: Endoscopy;  Laterality: N/A;  1:00 pm   COLONOSCOPY N/A 03/06/2016   Procedure: COLONOSCOPY;  Surgeon: Malissa Hippo, MD;  [facility];  Service: Endoscopy;  Laterality: N/A;  1200   COLONOSCOPY N/A 04/18/2021   Procedure: COLONOSCOPY;  Surgeon: Malissa Hippo, MD;  [facility];  Service: Endoscopy;  Laterality: N/A;  805   ESOPHAGEAL DILATION N/A 01/27/2018   Procedure: ESOPHAGEAL DILATION;  Surgeon: Malissa Hippo, MD;  [facility];  Service: Endoscopy;  Laterality: N/A;   ESOPHAGEAL DILATION N/A 02/16/2020   Procedure: ESOPHAGEAL DILATION;  Surgeon: Malissa Hippo, MD;  [facility];  Service: Endoscopy;  Laterality: N/A;   ESOPHAGOGASTRODUODENOSCOPY     ESOPHAGOGASTRODUODENOSCOPY N/A 07/28/2013   Procedure: ESOPHAGOGASTRODUODENOSCOPY (EGD);  Surgeon: Malissa Hippo, MD;  [facility];  Service: Endoscopy;  Laterality: N/A;  125   ESOPHAGOGASTRODUODENOSCOPY N/A 01/27/2018   Procedure: ESOPHAGOGASTRODUODENOSCOPY (EGD);  Surgeon: Malissa Hippo,  MD;  [facility];  Service: Endoscopy;  Laterality: N/A;  2:00-moved to 10/30 @ 2:45pm per Ann   ESOPHAGOGASTRODUODENOSCOPY N/A 02/16/2020   Procedure: ESOPHAGOGASTRODUODENOSCOPY (EGD);  Surgeon: Malissa Hippo, MD;  [facility];  Service: Endoscopy;  Laterality: N/A;  10:00   LEFT HEART CATH AND CORONARY ANGIOGRAPHY N/A 07/17/2017   Procedure: LEFT HEART CATH AND CORONARY ANGIOGRAPHY;  Surgeon: Tonny Bollman, MD;  Location: [facility] INV[facility]IVE CV LAB;  Service: Cardiovascular;  Laterality: N/A;   MALONEY DILATION N/A 07/28/2013   Procedure: Elease Hashimoto DILATION;  Surgeon: Malissa Hippo, MD;  [facility];  Service: Endoscopy;  Laterality: N/A;   SAVORY DILATION N/A 07/28/2013   Procedure: SAVORY DILATION;  Surgeon: Malissa Hippo, MD;  [facility];  Service: Endoscopy;  Laterality: N/A;   THYROIDECTOMY  11/20/2011   Procedure: THYROIDECTOMY;  Surgeon: Serena Colonel, MD;  [facility];  Service: ENT;  Laterality: Left;  LEFT THYROID LOBECTOMY   TUBAL LIGATION     38 yrs ago.    Current Outpatient Medications  Medication Sig Dispense Refill   ALPRAZolam (XANAX) 0.25 MG tablet Take 1/2-1 tablet po BID prn anxiety. CAUTION:DROWSINESS 20 tablet 0  aspirin EC 81 MG tablet Take 81 mg by mouth once a week. Swallow whole.     ezetimibe (ZETIA) 10 MG tablet TAKE ONE TABLET BY MOUTH ONCE DAILY. 90 tablet 1   levothyroxine (SYNTHROID) 75 MCG tablet TAKE 1 TABLET TUESDAY TO   SUNDAY AND 1/2 TABLET ON   MONDAY (Patient taking differently: TAKE 1 TABLET TUESDAY THROUGH  SUNDAY AND 1/2 TABLET ON   MONDAY) 88 tablet 1   Multiple Vitamins-Minerals (ALIVE WOMENS 50+ PO) Take 1 tablet by mouth daily.     No current facility-administered medications for this visit.    Allergies as of 10/23/2022 - Review Complete 10/23/2022  Allergen Reaction Noted   Compazine Other (See Comments) 05/08/2010   Statins Other (See Comments) 08/22/2014   Sulfa drugs cross reactors Rash  05/08/2010    Family History  Problem Relation Age of Onset   Heart disease Mother    Hypertension Mother    Stroke Mother    Thyroid disease Mother    Dementia Mother    Stroke Maternal Grandmother    Cancer Paternal Grandmother        breast   Colon cancer Neg Hx     Social History   Socioeconomic History   Marital status: Widowed    Spouse name: Not on file   Number of children: 2   Years of education: Not on file   Highest education level: High school graduate  Occupational History   Not on file  Tobacco Use   Smoking status: Never   Smokeless tobacco: Never  Vaping Use   Vaping status: Never Used  Substance and Sexual Activity   Alcohol use: No    Alcohol/week: 0.0 standard drinks of alcohol   Drug use: No   Sexual activity: Not Currently  Other Topics Concern   Not on file  Social History Narrative   12/11/20 lives alone long term boyfriend passed away in Apr 05, 2020  caffeine- one soda daily, coffee, 1/2 cup   2 children   High school   3 grandchildren   Social Determinants of Health   Financial Resource Strain: Low Risk  (01/15/2021)   Overall Financial Resource Strain (CARDIA)    Difficulty of Paying Living Expenses: Not hard at all  Food Insecurity: No Food Insecurity (01/15/2021)   Hunger Vital Sign    Worried About Running Out of Food in the Last Year: Never true    Ran Out of Food in the Last Year: Never true  Transportation Needs: No Transportation Needs (01/15/2021)   PRAPARE - Administrator, Civil Service (Medical): No    Lack of Transportation (Non-Medical): No  Physical Activity: Insufficiently Active (01/15/2021)   Exercise Vital Sign    Days of Exercise per Week: 3 days    Minutes of Exercise per Session: 20 min  Stress: No Stress Concern Present (01/15/2021)   [employer] of Occupational Health - Occupational Stress Questionnaire    Feeling of Stress : Only a little  Social Connections: Moderately Integrated  (01/15/2021)   Social Connection and Isolation Panel [NHANES]    Frequency of Communication with Friends and Family: More than three times a week    Frequency of Social Gatherings with Friends and Family: More than three times a week    Attends Religious Services: 1 to 4 times per year    Active Member of [employer] or Organizations: Yes    Attends Banker Meetings: 1 to 4 times per year  Marital Status: Widowed    Review of systems General: negative for malaise, night sweats, fever, chills, weight loss  Neck: Negative for lumps, goiter, pain and significant neck swelling Resp: Negative for cough, wheezing, dyspnea at rest CV: Negative for chest pain, leg swelling, palpitations, orthopnea GI: denies melena, hematochezia, nausea, vomiting, diarrhea, dysphagia, odyonophagia, early satiety or unintentional weight loss. +constipation  MSK: Negative for joint pain or swelling, back pain, and muscle pain. Derm: Negative for itching or rash Psych: Denies depression, anxiety, memory loss, confusion. No homicidal or suicidal ideation.  Heme: Negative for prolonged bleeding, bruising easily, and swollen nodes. Endocrine: Negative for cold or heat intolerance, polyuria, polydipsia and goiter. Neuro: negative for tremor, gait imbalance, syncope and seizures. The remainder of the review of systems is noncontributory.  Physical Exam: BP 138/67   Pulse 65   Temp 98.2 F (36.8 C) (Oral)   Ht 5\' 3"  (1.6 m)   Wt 124 lb 4.8 oz (56.4 kg)   BMI 22.02 kg/m  General:   Alert and oriented. No distress noted. Pleasant and cooperative.  Head:  Normocephalic and atraumatic. Eyes:  Conjuctiva clear without scleral icterus. Mouth:  Oral mucosa pink and moist. Good dentition. No lesions. Heart: Normal rate and rhythm, s1 and s2 heart sounds present.  Lungs: Clear lung sounds in all lobes. Respirations equal and unlabored. Abdomen:  good bowel sounds noted, soft, Mild TTP of generalized abdomen and  non-distended. No rebound or guarding. No HSM or masses noted. Derm: No palmar erythema or jaundice Msk:  Symmetrical without gross deformities. Normal posture. Extremities:  Without edema. Neurologic:  Alert and  oriented x4 Psych:  Alert and cooperative. Normal mood and affect.  Invalid input(s): "6 MONTHS"   ASSESSMENT: Grace [name] is a 77 y.o. female presenting today for constipation  Having ongoing constipation. Water intake reportedly is good. She tried stool softener/laxative, small amounts of miralax and did an enema a few days ago. She continues to feel full and has had minimal passage of stools. No rectal bleeding or melena. I discussed with her the importance of a good bowel regimen, diet high in fruits, veggies, whole grains and good water intake. I did discuss starting a prescription strength laxative but patient prefers to hold off at this time. We will do a bowel prep to get her cleaned out and then Start taking Miralax 1 capful every day for one week. If bowel movements do not improve, increase to 1 capful every 12 hours. If after two weeks there is no improvement, increase to 1 capful every 8 hours. She will make me aware if miralax is not providing good results, will then need to consider prescription laxative such as trulance or linzess.    PLAN:  Bowel prep   2. After bowel prep, Start taking Miralax 1 capful every day for one week. If bowel movements do not improve, increase to 1 capful every 12 hours. If after two weeks there is no improvement, increase to 1 capful every 8 hours 3. Continue with good water intake 4. Diet high in fruits, veggies, whole grains, kiwi and prunes are especially  5. Consider linzess/trulance if miralax not providing results   All questions were answered, patient verbalized understanding and is in agreement with plan as outlined above.   Follow Up: 3 months   Ryshawn Sanzone L. Jeanmarie Hubert, MSN, APRN, AGNP-C Adult-Gerontology Nurse  Practitioner [facility]  I have reviewed the note and agree with the APP's assessment as described  in this progress note  Will consider Linzess/trulance if constipation persists - can consider as well Motegrity given coexistent gastroparesis.  Katrinka Blazing, MD Gastroenterology and Hepatology [facility] Gastroenterology

## 2022-10-29 ENCOUNTER — Ambulatory Visit (HOSPITAL_COMMUNITY)
Admission: RE | Admit: 2022-10-29 | Discharge: 2022-10-29 | Disposition: A | Discharge status: Home / Self Care | Payer: Medicare HMO | Source: Ambulatory Visit | Attending: Student | Admitting: Student

## 2022-10-29 DIAGNOSIS — I351 Nonrheumatic aortic (valve) insufficiency: Secondary | ICD-10-CM | POA: Insufficient documentation

## 2022-10-29 LAB — ECHOCARDIOGRAM COMPLETE
AR max vel: 3.21 cm2
AV Area VTI: 3.14 cm2
AV Area mean vel: 3.4 cm2
AV Mean grad: 1.5 mmHg
AV Peak grad: 3 mmHg
Ao pk vel: 0.87 m/s
Area-P 1/2: 2.95 cm2
P 1/2 time: 566 msec
S' Lateral: 2.4 cm

## 2022-10-29 NOTE — Progress Notes (Signed)
*  PRELIMINARY RESULTS* Echocardiogram 2D Echocardiogram has been performed.  Stacey Drain 10/29/2022, 9:15 AM

## 2022-10-30 ENCOUNTER — Telehealth: Payer: Self-pay | Admitting: *Deleted

## 2022-10-30 DIAGNOSIS — R0609 Other forms of dyspnea: Secondary | ICD-10-CM

## 2022-10-30 NOTE — Telephone Encounter (Signed)
-----   Message from [place] sent at 10/29/2022  2:37 PM EDT ----- Please let the patient know that her echocardiogram is reassuring and shows a preserved ejection fraction of 60 to 65% with no wall motion abnormalities. Right ventricular function is normal as well along with normal pulmonary pressures. Her aortic regurgitation remains in the mild range which is similar to imaging last year. If she is still having dyspnea on exertion, I would recommend referral to Pulmonology as discussed at the time of her office visit given her abnormal chest CT.

## 2022-11-06 NOTE — Progress Notes (Signed)
Grace [name], female    DOB: 1945-12-05    MRN: [id number]   Brief patient profile:  69  yowf never smoker but heavy passive exp  referred to pulmonary clinic in [place]  11/07/2022 by Randall An  for breathing problems assoc with palpitations and abn ct chest.     History of Present Illness  11/07/2022  Pulmonary/ 1st office eval/ Grace [name] / [place] Office  Chief Complaint  Patient presents with   Establish Care   Shortness of Breath  Dyspnea:  mostly just with steps, not with housework/ mostly notices sob at resp > exertion and ex brings on fatigue more than sob  Cough: none  Sleep: fine/ one pillow no resp cc  SABA use: none   No obvious day to day or daytime pattern/variability or assoc excess/ purulent sputum or mucus plugs or hemoptysis or cp or chest tightness, subjective wheeze or overt sinus or hb symptoms.    . Also denies any obvious fluctuation of symptoms with weather or environmental changes or other aggravating or alleviating factors except as outlined above   No unusual exposure hx or h/o childhood pna/ asthma or knowledge of premature birth.  Current Allergies, Complete Past Medical History, Past Surgical History, Family History, and Social History were reviewed in [employer] record.  ROS  The following are not active complaints unless bolded Hoarseness, sore throat, dysphagia, dental problems, itching, sneezing,  nasal congestion or discharge of excess mucus or purulent secretions, ear ache,   fever, chills, sweats, unintended wt loss or wt gain, classically pleuritic or exertional cp,  orthopnea pnd or arm/hand swelling  or leg swelling, presyncope, palpitations, abdominal pain, anorexia, nausea, vomiting, diarrhea  or change in bowel habits or change in bladder habits, change in stools or change in urine, dysuria, hematuria,  rash, arthralgias, visual complaints, headache, numbness, weakness or ataxia or problems with walking or  coordination,  change in mood or  memory. fatigue                Outpatient Medications Prior to Visit  Medication Sig Dispense Refill   ALPRAZolam (XANAX) 0.25 MG tablet Take 1/2-1 tablet po BID prn anxiety. CAUTION:DROWSINESS 20 tablet 0   aspirin EC 81 MG tablet Take 81 mg by mouth once a week. Swallow whole.     ezetimibe (ZETIA) 10 MG tablet TAKE ONE TABLET BY MOUTH ONCE DAILY. 90 tablet 1   levothyroxine (SYNTHROID) 75 MCG tablet TAKE 1 TABLET TUESDAY TO   SUNDAY AND 1/2 TABLET ON   MONDAY (Patient taking differently: TAKE 1 TABLET TUESDAY THROUGH  SUNDAY AND 1/2 TABLET ON   MONDAY) 88 tablet 1   Multiple Vitamins-Minerals (ALIVE WOMENS 50+ PO) Take 1 tablet by mouth daily.     PAXLOVID, 300/100, 20 x 150 MG & 10 x 100MG  TBPK Take by mouth 2 (two) times daily.     No facility-administered medications prior to visit.    Past Medical History:  Diagnosis Date   Dry eyes, bilateral    Frequency of urination    Heart murmur    Hyperlipidemia    Hypothyroid    Migraine    Mitral valve prolapse    Skin cancer    Stroke [facility])    TIA (transient ischemic attack) 02/19/2012      Objective:     BP 130/67   Pulse 87   Ht 5\' 3"  (1.6 m)   Wt 123 lb (55.8 kg)   SpO2 96%  BMI 21.79 kg/m   SpO2: 96 % RA  amb anxious wf    HEENT : Oropharynx  nl     Nasal turbinates nl    NECK :  without  apparent JVD/ palpable Nodes/TM    LUNGS: no acc muscle use,  Nl contour chest which is clear to A and P bilaterally without cough on insp or exp maneuvers   CV:  RRR  no s3 or murmur or increase in P2, and no edema   ABD:  soft and nontender with nl inspiratory excursion in the supine position. No bruits or organomegaly appreciated   MS:  Nl gait/ ext warm without deformities Or obvious joint restrictions  calf tenderness, cyanosis or clubbing    SKIN: warm and dry without lesions    NEURO:  alert, approp, nl sensorium with  no motor or cerebellar deficits apparent.     I  personally reviewed images and agree with radiology impression as follows:   Chest CT s contrast 10/09/22   Small right lower lobe pulmonary nodules measuring up to 4 mm, unchanged, benign. No follow-up is recommended per Fleischner Society guidelines.   Mild subpleural patchy opacity/reticulation in the posterior right lung base, unchanged, likely reflecting post infectious inflammatory scarring.    Assessment   Dyspnea on exertion Onset ? 2023 not necessarily proportionate to exertion  - CT s contrast 10/09/22 unremarkable as to cause for doe  - Echo 09/3122  mild AR unchanged from priors - 11/07/2022   Walked on RA  x  3  lap(s) =  approx 450  ft  @ brisk pace, stopped due to end of study with lowest 02 sats 94%   Doe labs pending   No evidence of an airway or parenchymal lung issue contributing to her sob at rest and could not reproduce any doe, albeit walking flat.  Her main chronic symptom really dating back years = doe/ legs tired going up steps so rec submax ex program with f/u in 3 m, CPST could be considered if not making progress.  Each maintenance medication was reviewed in detail including emphasizing most importantly the difference between maintenance and prns and under what circumstances the prns are to be triggered using an action plan format where appropriate.  Total time for H and P, chart review, counseling,  directly observing portions of ambulatory 02 saturation study/ and generating customized AVS unique to this office visit / same day charting > 60 min new pt eval                    Sandrea Hughs, MD 11/07/2022

## 2022-11-07 ENCOUNTER — Encounter: Payer: Self-pay | Admitting: Internal Medicine

## 2022-11-07 ENCOUNTER — Ambulatory Visit: Payer: Medicare HMO | Admitting: Internal Medicine

## 2022-11-07 VITALS — BP 130/67 | HR 87 | Ht 63.0 in | Wt 123.0 lb

## 2022-11-07 DIAGNOSIS — R0609 Other forms of dyspnea: Secondary | ICD-10-CM | POA: Diagnosis not present

## 2022-11-07 NOTE — Patient Instructions (Addendum)
To get the most out of exercise, you need to be continuously aware that you are short of breath, but never out of breath, for at least 30 minutes daily. As you improve, it will actually be easier for you to do the same amount of exercise  in  30 minutes so always push to the level where you are short of breath.     Please remember to go to the lab department   for your tests - we will call you with the results when they are available.      Please schedule a follow up visit in 3 months but call sooner if needed

## 2022-11-07 NOTE — Assessment & Plan Note (Addendum)
Onset ? 2023 not necessarily proportionate to exertion  - CT s contrast 10/09/22 unremarkable as to cause for doe  - Echo 09/3122  mild AR unchanged from priors - 11/07/2022   Walked on RA  x  3  lap(s) =  approx 450  ft  @ brisk pace, stopped due to end of study with lowest 02 sats 94%   Doe labs pending   No evidence of an airway or parenchymal lung issue contributing to her sob at rest and could not reproduce any doe, albeit walking flat.  Her main chronic symptom really dating back years = doe/ legs tired going up steps so rec submax ex program with f/u in 3 m, CPST could be considered if not making progress.  Each maintenance medication was reviewed in detail including emphasizing most importantly the difference between maintenance and prns and under what circumstances the prns are to be triggered using an action plan format where appropriate.  Total time for H and P, chart review, counseling,  directly observing portions of ambulatory 02 saturation study/ and generating customized AVS unique to this office visit / same day charting > 60 min new pt eval

## 2022-12-10 DIAGNOSIS — M810 Age-related osteoporosis without current pathological fracture: Secondary | ICD-10-CM | POA: Insufficient documentation

## 2022-12-11 ENCOUNTER — Encounter (INDEPENDENT_AMBULATORY_CARE_PROVIDER_SITE_OTHER): Payer: Self-pay | Admitting: *Deleted

## 2022-12-22 ENCOUNTER — Ambulatory Visit (INDEPENDENT_AMBULATORY_CARE_PROVIDER_SITE_OTHER): Payer: Medicare HMO | Admitting: Gastroenterology

## 2022-12-22 ENCOUNTER — Encounter (INDEPENDENT_AMBULATORY_CARE_PROVIDER_SITE_OTHER): Payer: Self-pay | Admitting: Gastroenterology

## 2022-12-22 ENCOUNTER — Encounter: Payer: Self-pay | Admitting: *Deleted

## 2022-12-22 VITALS — BP 133/60 | HR 61 | Temp 98.5°F | Ht 61.0 in | Wt 123.6 lb

## 2022-12-22 DIAGNOSIS — K581 Irritable bowel syndrome with constipation: Secondary | ICD-10-CM

## 2022-12-22 DIAGNOSIS — K3184 Gastroparesis: Secondary | ICD-10-CM | POA: Diagnosis not present

## 2022-12-22 NOTE — Progress Notes (Unsigned)
Grace [name], M.D. Gastroenterology & Hepatology [facility] [address] [place], [place] [address]  Primary Care Physician: Toma Deiters, MD [address] [place] [address]  I will communicate my assessment and recommendations to the referring MD via EMR.  Problems: Gastroparesis Chronic constipation vs pelvic floor dysfunction.  History of Present Illness: Grace [name] is a 77 y.o. female with past medical history of chronic constipation, gastroparesis, heart failure with diastolic dysfunction, hyperlipidemia, hypothyroidism, stroke, mitral prolapse, who presents for follow up of gastroparesis and constipation.  The patient was last seen on 10/23/2022. At that time, the patient was complaining of constipation and was given a bowel prep, also advised to start MiraLAX after taking the bowel prep.  Patient reports feeling frequent fullness and not eating due to satiety. She is not taking any medication for gastroparesis. Satiety leads to frequent nausea but no vomiting.The patient denies having any  fever, chills, hematochezia, melena, hematemesis, diarrhea, jaundice, pruritus or weight loss.  She is taking Miralax 2 teaspoon per day. States that she has small amount of bowel of bowel movements daily. She has to push the stool to come out.  Last TSH was 3.72 (10/2022).  Gastric emptying study: 11/2019  12% emptied at 1 hr ( normal >= 10%) 17% emptied at 2 hr ( normal >= 40%) 21% emptied at 3 hr ( normal >= 70%) 25% emptied at 4 hr ( normal >= 90%)  IMPRESSION: Significantly delayed gastric emptying study.  Last EGD: 01/2020 The hypopharynx was normal. The examined esophagus was normal. The Z-line was irregular and was found 36 cm from the incisors. No endoscopic abnormality was evident in the esophagus to explain the patient's complaint of dysphagia. It was decided, however, to proceed with dilation of the entire  esophagus. The scope was withdrawn. Dilation was performed with a Maloney dilator with mild resistance at 54 Fr. The dilation site was examined following endoscope reinsertion and showed no change and no bleeding, mucosal tear or perforation. A few small sessile polyps were found in the gastric body. Diffuse mild inflammation characterized by congestion (edema), erythema and friability was found in the gastric body and in the gastric antrum. Biopsies were taken with a cold forceps for histology. The pylorus was normal. The duodenal bulb and second portion of the duodenum were normal.   SURGICAL PATHOLOGY  A. STOMACH, ANTRUM, BIOPSY:  - Mild reactive gastropathy.  - Warthin-Starry special stain is negative for Helicobacter pylori.   B. STOMACH, BODY, BIOPSY:  - Chronic gastritis and focal intestinal metaplasia, negative for  dysplasia.  - Helicobacter pylori immunostain is negative for Helicobacter pylori.    Last Colonoscopy: 03/2021, performed by Dr. Karilyn Cota Normal colon, external hemorrhoids.  Past Medical History: Past Medical History:  Diagnosis Date   Dry eyes, bilateral    Frequency of urination    Heart murmur    Hyperlipidemia    Hypothyroid    Migraine    Mitral valve prolapse    Skin cancer    Stroke [facility])    TIA (transient ischemic attack) 02/19/2012    Past Surgical History: Past Surgical History:  Procedure Laterality Date   BALLOON DILATION N/A 07/28/2013   Procedure: BALLOON DILATION;  Surgeon: Malissa Hippo, MD;  [facility];  Service: Endoscopy;  Laterality: N/A;   BIOPSY  02/16/2020   Procedure: BIOPSY;  Surgeon: Malissa Hippo, MD;  [facility];  Service: Endoscopy;;   BREAST BIOPSY  left benign tumor   BREAST BIOPSY Left 04/09/2022   MM LT BREAST BX W LOC DEV 1ST LESION IMAGE BX SPEC STEREO GUIDE 04/09/2022 [facility]   CATARACT EXTRACTION W/PHACO Right 07/11/2013   Procedure: CATARACT EXTRACTION PHACO AND INTRAOCULAR  LENS PLACEMENT (IOC);  Surgeon: Gemma Payor, MD;  Location: [facility];  Service: Ophthalmology;  Laterality: Right;  CDE 9.99   CATARACT EXTRACTION W/PHACO Left 08/04/2013   Procedure: CATARACT EXTRACTION PHACO AND INTRAOCULAR LENS PLACEMENT (IOC);  Surgeon: Gemma Payor, MD;  [facility]: [facility];  Service: Ophthalmology;  Laterality: Left;  CDE:16.26   CHOLECYSTECTOMY     COLONOSCOPY  12/18/2010   Procedure: COLONOSCOPY;  Surgeon: Malissa Hippo, MD;  [facility];  Service: Endoscopy;  Laterality: N/A;  1:00 pm   COLONOSCOPY N/A 03/06/2016   Procedure: COLONOSCOPY;  Surgeon: Malissa Hippo, MD;  [facility];  Service: Endoscopy;  Laterality: N/A;  1200   COLONOSCOPY N/A 04/18/2021   Procedure: COLONOSCOPY;  Surgeon: Malissa Hippo, MD;  [facility];  Service: Endoscopy;  Laterality: N/A;  805   ESOPHAGEAL DILATION N/A 01/27/2018   Procedure: ESOPHAGEAL DILATION;  Surgeon: Malissa Hippo, MD;  [facility];  Service: Endoscopy;  Laterality: N/A;   ESOPHAGEAL DILATION N/A 02/16/2020   Procedure: ESOPHAGEAL DILATION;  Surgeon: Malissa Hippo, MD;  [facility];  Service: Endoscopy;  Laterality: N/A;   ESOPHAGOGASTRODUODENOSCOPY     ESOPHAGOGASTRODUODENOSCOPY N/A 07/28/2013   Procedure: ESOPHAGOGASTRODUODENOSCOPY (EGD);  Surgeon: Malissa Hippo, MD;  [facility];  Service: Endoscopy;  Laterality: N/A;  125   ESOPHAGOGASTRODUODENOSCOPY N/A 01/27/2018   Procedure: ESOPHAGOGASTRODUODENOSCOPY (EGD);  Surgeon: Malissa Hippo, MD;  [facility];  Service: Endoscopy;  Laterality: N/A;  2:00-moved to 10/30 @ 2:45pm per Ann   ESOPHAGOGASTRODUODENOSCOPY N/A 02/16/2020   Procedure: ESOPHAGOGASTRODUODENOSCOPY (EGD);  Surgeon: Malissa Hippo, MD;  [facility];  Service: Endoscopy;  Laterality: N/A;  10:00   LEFT HEART CATH AND CORONARY ANGIOGRAPHY N/A 07/17/2017   Procedure: LEFT HEART CATH AND CORONARY ANGIOGRAPHY;   Surgeon: Tonny Bollman, MD;  Location: [facility] INVASIVE CV LAB;  Service: Cardiovascular;  Laterality: N/A;   MALONEY DILATION N/A 07/28/2013   Procedure: Elease Hashimoto DILATION;  Surgeon: Malissa Hippo, MD;  [facility];  Service: Endoscopy;  Laterality: N/A;   SAVORY DILATION N/A 07/28/2013   Procedure: SAVORY DILATION;  Surgeon: Malissa Hippo, MD;  [facility];  Service: Endoscopy;  Laterality: N/A;   THYROIDECTOMY  11/20/2011   Procedure: THYROIDECTOMY;  Surgeon: Serena Colonel, MD;  [facility] OR;  Service: ENT;  Laterality: Left;  LEFT THYROID LOBECTOMY   TUBAL LIGATION     39 yrs ago.    Family History: Family History  Problem Relation Age of Onset   Heart disease Mother    Hypertension Mother    Stroke Mother    Thyroid disease Mother    Dementia Mother    Stroke Maternal Grandmother    Cancer Paternal Grandmother        breast   Colon cancer Neg Hx     Social History: Social History   Tobacco Use  Smoking Status Never  Smokeless Tobacco Never   Social History   Substance and Sexual Activity  Alcohol Use No   Alcohol/week: 0.0 standard drinks of alcohol   Social History   Substance and Sexual Activity  Drug Use No  Allergies: Allergies  Allergen Reactions   Compazine Other (See Comments)    Slurred speech   Statins Other (See Comments)    myalgias   Sulfa Drugs Cross Reactors Rash    Medications: Current Outpatient Medications  Medication Sig Dispense Refill   ALPRAZolam (XANAX) 0.25 MG tablet Take 1/2-1 tablet po BID prn anxiety. CAUTION:DROWSINESS 20 tablet 0   aspirin EC 81 MG tablet Take 81 mg by mouth once a week. Swallow whole.     ezetimibe (ZETIA) 10 MG tablet TAKE ONE TABLET BY MOUTH ONCE DAILY. 90 tablet 1   levothyroxine (SYNTHROID) 75 MCG tablet TAKE 1 TABLET TUESDAY TO   SUNDAY AND 1/2 TABLET ON   MONDAY (Patient taking differently: TAKE 1 TABLET TUESDAY THROUGH  SUNDAY AND 1/2 TABLET ON   MONDAY) 88 tablet 1    Multiple Vitamins-Minerals (ALIVE WOMENS 50+ PO) Take 1 tablet by mouth daily.     No current facility-administered medications for this visit.    Review of Systems: GENERAL: negative for malaise, night sweats HEENT: No changes in hearing or vision, no nose bleeds or other nasal problems. NECK: Negative for lumps, goiter, pain and significant neck swelling RESPIRATORY: Negative for cough, wheezing CARDIOVASCULAR: Negative for chest pain, leg swelling, palpitations, orthopnea GI: SEE HPI MUSCULOSKELETAL: Negative for joint pain or swelling, back pain, and muscle pain. SKIN: Negative for lesions, rash PSYCH: Negative for sleep disturbance, mood disorder and recent psychosocial stressors. HEMATOLOGY Negative for prolonged bleeding, bruising easily, and swollen nodes. ENDOCRINE: Negative for cold or heat intolerance, polyuria, polydipsia and goiter. NEURO: negative for tremor, gait imbalance, syncope and seizures. The remainder of the review of systems is noncontributory.   Physical Exam: BP 133/60   Pulse 61   Temp 98.5 F (36.9 C)   Ht 5\' 1"  (1.549 m)   Wt 123 lb 9.6 oz (56.1 kg)   BMI 23.35 kg/m  GENERAL: The patient is AO x3, in no acute distress. HEENT: Head is normocephalic and atraumatic. EOMI are intact. Mouth is well hydrated and without lesions. NECK: Supple. No masses LUNGS: Clear to auscultation. No presence of rhonchi/wheezing/rales. Adequate chest expansion HEART: RRR, normal s1 and s2. ABDOMEN:mildly tender diffusely, no guarding, no peritoneal signs, and nondistended. BS +. No masses. EXTREMITIES: Without any cyanosis, clubbing, rash, lesions or edema. NEUROLOGIC: AOx3, no focal motor deficit. SKIN: no jaundice, no rashes  Imaging/Labs: as above  I personally reviewed and interpreted the available labs, imaging and endoscopic files.  Impression and Plan: NOELEEN FAW is a 77 y.o. female with past medical history of chronic constipation, gastroparesis, heart  failure with diastolic dysfunction, hyperlipidemia, hypothyroidism, stroke, mitral prolapse, who presents for follow up of gastroparesis and constipation.  Regarding her gastroparesis, the patient has presented persistent symptoms.  We have discussed in the past the possibility of using domperidone but she has not ordered his medication form outside of [place].  She does not recall taking Reglan in the past but based on previous notes she took the medication up to 3 times a day without significant improvement.  We discussed the possibility of renal although the possibility of using domperidone to achieve symptom improvement, she will be to hold this medication.  However, we will evaluate further her symptoms with a repeat EGD given her family history of gastric cancer.  We also discussed other treatment options such as G-POEM and Botox injection but she is not interested in this for now.  In terms of her constipation, she can increase  the use of MiraLAX to more regular dosing.  Will need to consider anorectal manometry if persistent constipation.  -Patient showed read about domperidone 5 mg three times a day for management of gastroparesis.  If interested in using this medication, patient will need to order the medicine from [place] - Continue using MiraLAX 2 times a day, can increase to 3 times a day every other day if presenting worsening constipation - Patient should let us know if interested on pursuing anorectal manometry at [facility]  - Schedule EGD  All questions were answered.      Grace Blazing, MD Gastroenterology and Hepatology [facility]

## 2022-12-22 NOTE — Patient Instructions (Addendum)
Please read about domperidone 5 mg three times a day for management of gastroparesis.  If interested in using this medication, please order the medicine from [place] Continue using MiraLAX 2 times a day, can increase to 3 times a day every other day if presenting worsening constipation Please let us know if interested in pursuing testing at [facility] if you presenting worsening constipation Schedule EGD

## 2022-12-22 NOTE — H&P (View-Only) (Signed)
Katrinka Blazing, M.D. Gastroenterology & Hepatology [facility] [address] [place], [place] [address]  Primary Care Physician: Toma Deiters, MD [address] [place] [address]  I will communicate my assessment and recommendations to the referring MD via EMR.  Problems: Gastroparesis Chronic constipation vs pelvic floor dysfunction.  History of Present Illness: Grace [name] is a 77 y.o. female with past medical history of chronic constipation, gastroparesis, heart failure with diastolic dysfunction, hyperlipidemia, hypothyroidism, stroke, mitral prolapse, who presents for follow up of gastroparesis and constipation.  The patient was last seen on 10/23/2022. At that time, the patient was complaining of constipation and was given a bowel prep, also advised to start MiraLAX after taking the bowel prep.  Patient reports feeling frequent fullness and not eating due to satiety. She is not taking any medication for gastroparesis. Satiety leads to frequent nausea but no vomiting.The patient denies having any  fever, chills, hematochezia, melena, hematemesis, diarrhea, jaundice, pruritus or weight loss.  She is taking Miralax 2 teaspoon per day. States that she has small amount of bowel of bowel movements daily. She has to push the stool to come out.  Last TSH was 3.72 (10/2022).  Gastric emptying study: 11/2019  12% emptied at 1 hr ( normal >= 10%) 17% emptied at 2 hr ( normal >= 40%) 21% emptied at 3 hr ( normal >= 70%) 25% emptied at 4 hr ( normal >= 90%)  IMPRESSION: Significantly delayed gastric emptying study.  Last EGD: 01/2020 The hypopharynx was normal. The examined esophagus was normal. The Z-line was irregular and was found 36 cm from the incisors. No endoscopic abnormality was evident in the esophagus to explain the patient's complaint of dysphagia. It was decided, however, to proceed with dilation of the entire  esophagus. The scope was withdrawn. Dilation was performed with a Maloney dilator with mild resistance at 54 Fr. The dilation site was examined following endoscope reinsertion and showed no change and no bleeding, mucosal tear or perforation. A few small sessile polyps were found in the gastric body. Diffuse mild inflammation characterized by congestion (edema), erythema and friability was found in the gastric body and in the gastric antrum. Biopsies were taken with a cold forceps for histology. The pylorus was normal. The duodenal bulb and second portion of the duodenum were normal.   SURGICAL PATHOLOGY  A. STOMACH, ANTRUM, BIOPSY:  - Mild reactive gastropathy.  - Warthin-Starry special stain is negative for Helicobacter pylori.   B. STOMACH, BODY, BIOPSY:  - Chronic gastritis and focal intestinal metaplasia, negative for  dysplasia.  - Helicobacter pylori immunostain is negative for Helicobacter pylori.    Last Colonoscopy: 03/2021, performed by Dr. Karilyn Cota Normal colon, external hemorrhoids.  Past Medical History: Past Medical History:  Diagnosis Date   Dry eyes, bilateral    Frequency of urination    Heart murmur    Hyperlipidemia    Hypothyroid    Migraine    Mitral valve prolapse    Skin cancer    Stroke [facility])    TIA (transient ischemic attack) 02/19/2012    Past Surgical History: Past Surgical History:  Procedure Laterality Date   BALLOON DILATION N/A 07/28/2013   Procedure: BALLOON DILATION;  Surgeon: Malissa Hippo, MD;  [facility];  Service: Endoscopy;  Laterality: N/A;   BIOPSY  02/16/2020   Procedure: BIOPSY;  Surgeon: Malissa Hippo, MD;  [facility];  Service: Endoscopy;;   BREAST BIOPSY  left benign tumor   BREAST BIOPSY Left 04/09/2022   MM LT BREAST BX W LOC DEV 1ST LESION IMAGE BX SPEC STEREO GUIDE 04/09/2022 [facility]   CATARACT EXTRACTION W/PHACO Right 07/11/2013   Procedure: CATARACT EXTRACTION PHACO AND INTRAOCULAR  LENS PLACEMENT (IOC);  Surgeon: Gemma Payor, MD;  [facility]: [facility];  Service: Ophthalmology;  Laterality: Right;  CDE 9.99   CATARACT EXTRACTION W/PHACO Left 08/04/2013   Procedure: CATARACT EXTRACTION PHACO AND INTRAOCULAR LENS PLACEMENT (IOC);  Surgeon: Gemma Payor, MD;  Location: [facility];  Service: Ophthalmology;  Laterality: Left;  CDE:16.26   CHOLECYSTECTOMY     COLONOSCOPY  12/18/2010   Procedure: COLONOSCOPY;  Surgeon: Malissa Hippo, MD;  [facility];  Service: Endoscopy;  Laterality: N/A;  1:00 pm   COLONOSCOPY N/A 03/06/2016   Procedure: COLONOSCOPY;  Surgeon: Malissa Hippo, MD;  [facility];  Service: Endoscopy;  Laterality: N/A;  1200   COLONOSCOPY N/A 04/18/2021   Procedure: COLONOSCOPY;  Surgeon: Malissa Hippo, MD;  [facility];  Service: Endoscopy;  Laterality: N/A;  805   ESOPHAGEAL DILATION N/A 01/27/2018   Procedure: ESOPHAGEAL DILATION;  Surgeon: Malissa Hippo, MD;  [facility];  Service: Endoscopy;  Laterality: N/A;   ESOPHAGEAL DILATION N/A 02/16/2020   Procedure: ESOPHAGEAL DILATION;  Surgeon: Malissa Hippo, MD;  [facility];  Service: Endoscopy;  Laterality: N/A;   ESOPHAGOGASTRODUODENOSCOPY     ESOPHAGOGASTRODUODENOSCOPY N/A 07/28/2013   Procedure: ESOPHAGOGASTRODUODENOSCOPY (EGD);  Surgeon: Malissa Hippo, MD;  [facility];  Service: Endoscopy;  Laterality: N/A;  125   ESOPHAGOGASTRODUODENOSCOPY N/A 01/27/2018   Procedure: ESOPHAGOGASTRODUODENOSCOPY (EGD);  Surgeon: Malissa Hippo, MD;  [facility];  Service: Endoscopy;  Laterality: N/A;  2:00-moved to 10/30 @ 2:45pm per Ann   ESOPHAGOGASTRODUODENOSCOPY N/A 02/16/2020   Procedure: ESOPHAGOGASTRODUODENOSCOPY (EGD);  Surgeon: Malissa Hippo, MD;  [facility];  Service: Endoscopy;  Laterality: N/A;  10:00   LEFT HEART CATH AND CORONARY ANGIOGRAPHY N/A 07/17/2017   Procedure: LEFT HEART CATH AND CORONARY ANGIOGRAPHY;   Surgeon: Tonny Bollman, MD;  Location: [facility] INVASIVE CV LAB;  Service: Cardiovascular;  Laterality: N/A;   MALONEY DILATION N/A 07/28/2013   Procedure: Elease Hashimoto DILATION;  Surgeon: Malissa Hippo, MD;  [facility];  Service: Endoscopy;  Laterality: N/A;   SAVORY DILATION N/A 07/28/2013   Procedure: SAVORY DILATION;  Surgeon: Malissa Hippo, MD;  [facility];  Service: Endoscopy;  Laterality: N/A;   THYROIDECTOMY  11/20/2011   Procedure: THYROIDECTOMY;  Surgeon: Serena Colonel, MD;  Location: [facility] OR;  Service: ENT;  Laterality: Left;  LEFT THYROID LOBECTOMY   TUBAL LIGATION     39 yrs ago.    Family History: Family History  Problem Relation Age of Onset   Heart disease Mother    Hypertension Mother    Stroke Mother    Thyroid disease Mother    Dementia Mother    Stroke Maternal Grandmother    Cancer Paternal Grandmother        breast   Colon cancer Neg Hx     Social History: Social History   Tobacco Use  Smoking Status Never  Smokeless Tobacco Never   Social History   Substance and Sexual Activity  Alcohol Use No   Alcohol/week: 0.0 standard drinks of alcohol   Social History   Substance and Sexual Activity  Drug Use No  Allergies: Allergies  Allergen Reactions   Compazine Other (See Comments)    Slurred speech   Statins Other (See Comments)    myalgias   Sulfa Drugs Cross Reactors Rash    Medications: Current Outpatient Medications  Medication Sig Dispense Refill   ALPRAZolam (XANAX) 0.25 MG tablet Take 1/2-1 tablet po BID prn anxiety. CAUTION:DROWSINESS 20 tablet 0   aspirin EC 81 MG tablet Take 81 mg by mouth once a week. Swallow whole.     ezetimibe (ZETIA) 10 MG tablet TAKE ONE TABLET BY MOUTH ONCE DAILY. 90 tablet 1   levothyroxine (SYNTHROID) 75 MCG tablet TAKE 1 TABLET TUESDAY TO   SUNDAY AND 1/2 TABLET ON   MONDAY (Patient taking differently: TAKE 1 TABLET TUESDAY THROUGH  SUNDAY AND 1/2 TABLET ON   MONDAY) 88 tablet 1    Multiple Vitamins-Minerals (ALIVE WOMENS 50+ PO) Take 1 tablet by mouth daily.     No current facility-administered medications for this visit.    Review of Systems: GENERAL: negative for malaise, night sweats HEENT: No changes in hearing or vision, no nose bleeds or other nasal problems. NECK: Negative for lumps, goiter, pain and significant neck swelling RESPIRATORY: Negative for cough, wheezing CARDIOVASCULAR: Negative for chest pain, leg swelling, palpitations, orthopnea GI: SEE HPI MUSCULOSKELETAL: Negative for joint pain or swelling, back pain, and muscle pain. SKIN: Negative for lesions, rash PSYCH: Negative for sleep disturbance, mood disorder and recent psychosocial stressors. HEMATOLOGY Negative for prolonged bleeding, bruising easily, and swollen nodes. ENDOCRINE: Negative for cold or heat intolerance, polyuria, polydipsia and goiter. NEURO: negative for tremor, gait imbalance, syncope and seizures. The remainder of the review of systems is noncontributory.   Physical Exam: BP 133/60   Pulse 61   Temp 98.5 F (36.9 C)   Ht 5\' 1"  (1.549 m)   Wt 123 lb 9.6 oz (56.1 kg)   BMI 23.35 kg/m  GENERAL: The patient is AO x3, in no acute distress. HEENT: Head is normocephalic and atraumatic. EOMI are intact. Mouth is well hydrated and without lesions. NECK: Supple. No masses LUNGS: Clear to auscultation. No presence of rhonchi/wheezing/rales. Adequate chest expansion HEART: RRR, normal s1 and s2. ABDOMEN:mildly tender diffusely, no guarding, no peritoneal signs, and nondistended. BS +. No masses. EXTREMITIES: Without any cyanosis, clubbing, rash, lesions or edema. NEUROLOGIC: AOx3, no focal motor deficit. SKIN: no jaundice, no rashes  Imaging/Labs: as above  I personally reviewed and interpreted the available labs, imaging and endoscopic files.  Impression and Plan: Grace [name] is a 77 y.o. female with past medical history of chronic constipation, gastroparesis, heart  failure with diastolic dysfunction, hyperlipidemia, hypothyroidism, stroke, mitral prolapse, who presents for follow up of gastroparesis and constipation.  Regarding her gastroparesis, the patient has presented persistent symptoms.  We have discussed in the past the possibility of using domperidone but she has not ordered his medication form outside of [place].  She does not recall taking Reglan in the past but based on previous notes she took the medication up to 3 times a day without significant improvement.  We discussed the possibility of renal although the possibility of using domperidone to achieve symptom improvement, she will be to hold this medication.  However, we will evaluate further her symptoms with a repeat EGD given her family history of gastric cancer.  We also discussed other treatment options such as G-POEM and Botox injection but she is not interested in this for now.  In terms of her constipation, she can increase  the use of MiraLAX to more regular dosing.  Will need to consider anorectal manometry if persistent constipation.  -Patient showed read about domperidone 5 mg three times a day for management of gastroparesis.  If interested in using this medication, patient will need to order the medicine from [place] - Continue using MiraLAX 2 times a day, can increase to 3 times a day every other day if presenting worsening constipation - Patient should let us know if interested on pursuing anorectal manometry at [facility]  - Schedule EGD  All questions were answered.      Katrinka Blazing, MD Gastroenterology and Hepatology [facility]

## 2022-12-24 ENCOUNTER — Encounter: Payer: Self-pay | Admitting: *Deleted

## 2022-12-26 NOTE — Patient Instructions (Signed)
Grace [name]  12/26/2022     @PREFPERIOPPHARMACY @   Your procedure is scheduled on 12/30/2022.  Report to [facility] at 10:45 A.M.  Call this number if you have problems the morning of surgery:  [phone]  If you experience any cold or flu symptoms such as cough, fever, chills, shortness of breath, etc. between now and your scheduled surgery, please notify us at the above number.   Remember:   Please follow the diet instructions given to you by Dr Wilburt Finlay office.       Take these medicines the morning of surgery with A SIP OF WATER : Xanax as needed and Synthroid    Do not wear jewelry, make-up or nail polish, including gel polish,  artificial nails, or any other type of covering on natural nails (fingers and  toes).  Do not wear lotions, powders, or perfumes, or deodorant.  Do not shave 48 hours prior to surgery.  Men may shave face and neck.  Do not bring valuables to the hospital.  [facility] is not responsible for any belongings or valuables.  Contacts, dentures or bridgework may not be worn into surgery.  Leave your suitcase in the car.  After surgery it may be brought to your room.  For patients admitted to the hospital, discharge time will be determined by your treatment team.  Patients discharged the day of surgery will not be allowed to drive home.   Name and phone number of your driver:   Family Special instructions:  N/A  Please read over the following fact sheets that you were given. Care and Recovery After Surgery   Upper Endoscopy, Adult Upper endoscopy is a procedure to look inside the upper GI (gastrointestinal) tract. The upper GI tract is made up of: The esophagus. This is the part of the body that moves food from your mouth to your stomach. The stomach. The duodenum. This is the first part of your small intestine. This procedure is also called esophagogastroduodenoscopy (EGD) or gastroscopy. In this procedure, your health care provider  passes a thin, flexible tube (endoscope) through your mouth and down your esophagus into your stomach and into your duodenum. A small camera is attached to the end of the tube. Images from the camera appear on a monitor in the exam room. During this procedure, your health care provider may also remove a small piece of tissue to be sent to a lab and examined under a microscope (biopsy). Your health care provider may do an upper endoscopy to diagnose cancers of the upper GI tract. You may also have this procedure to find the cause of other conditions, such as: Stomach pain. Heartburn. Pain or problems when swallowing. Nausea and vomiting. Stomach bleeding. Stomach ulcers. Tell a health care provider about: Any allergies you have. All medicines you are taking, including vitamins, herbs, eye drops, creams, and over-the-counter medicines. Any problems you or family members have had with anesthetic medicines. Any bleeding problems you have. Any surgeries you have had. Any medical conditions you have. Whether you are pregnant or may be pregnant. What are the risks? Your healthcare provider will talk with you about risks. These may include: Infection. Bleeding. Allergic reactions to medicines. A tear or hole (perforation) in the esophagus, stomach, or duodenum. What happens before the procedure? When to stop eating and drinking Follow instructions from your health care provider about what you may eat and drink. These may include: 8 hours before your procedure Stop eating most foods. Do  not eat meat, fried foods, or fatty foods. Eat only light foods, such as toast or crackers. All liquids are okay except energy drinks and alcohol. 6 hours before your procedure Stop eating. Drink only clear liquids, such as water, clear fruit juice, black coffee, plain tea, and sports drinks. Do not drink energy drinks or alcohol. 2 hours before your procedure Stop drinking all liquids. You may be allowed to  take medicines with small sips of water. If you do not follow your health care provider's instructions, your procedure may be delayed or canceled. Medicines Ask your health care provider about: Changing or stopping your regular medicines. This is especially important if you are taking diabetes medicines or blood thinners. Taking medicines such as aspirin and ibuprofen. These medicines can thin your blood. Do not take these medicines unless your health care provider tells you to take them. Taking over-the-counter medicines, vitamins, herbs, and supplements. General instructions If you will be going home right after the procedure, plan to have a responsible adult: Take you home from the hospital or clinic. You will not be allowed to drive. Care for you for the time you are told. What happens during the procedure?  An IV will be inserted into one of your veins. You may be given one or more of the following: A medicine to help you relax (sedative). A medicine to numb the throat (local anesthetic). You will lie on your left side on an exam table. Your health care provider will pass the endoscope through your mouth and down your esophagus. Your health care provider will use the scope to check the inside of your esophagus, stomach, and duodenum. Biopsies may be taken. The endoscope will be removed. The procedure may vary among health care providers and hospitals. What happens after the procedure? Your blood pressure, heart rate, breathing rate, and blood oxygen level will be monitored until you leave the hospital or clinic. When your throat is no longer numb, you may be given some fluids to drink. If you were given a sedative during the procedure, it can affect you for several hours. Do not drive or operate machinery until your health care provider says that it is safe. It is up to you to get the results of your procedure. Ask your health care provider, or the department that is doing the  procedure, when your results will be ready. Contact a health care provider if you: Have a sore throat that lasts longer than 1 day. Have a fever. Get help right away if you: Vomit blood or your vomit looks like coffee grounds. Have bloody, black, or tarry stools. Have a very bad sore throat or you cannot swallow. Have difficulty breathing or very bad pain in your chest or abdomen. These symptoms may be an emergency. Get help right away. Call 911. Do not wait to see if the symptoms will go away. Do not drive yourself to the hospital. Summary Upper endoscopy is a procedure to look inside the upper GI tract. During the procedure, an IV will be inserted into one of your veins. You may be given a medicine to help you relax. The endoscope will be passed through your mouth and down your esophagus. Follow instructions from your health care provider about what you can eat and drink. This information is not intended to replace advice given to you by your health care provider. Make sure you discuss any questions you have with your health care provider. Document Revised: 06/26/2021 Document Reviewed: 06/26/2021 Elsevier Patient  Education  2024 [employer]   Monitored Anesthesia Care Anesthesia refers to the techniques, procedures, and medicines that help a person stay safe and comfortable during surgery. Monitored anesthesia care, or sedation, is one type of anesthesia. You may have sedation if you do not need to be asleep for your procedure. Procedures that use sedation may include: Surgery to remove cataracts from your eyes. A dental procedure. A biopsy. This is when a tissue sample is removed and looked at under a microscope. You will be watched closely during your procedure. Your level of sedation or type of anesthesia may be changed to fit your needs. Tell a health care provider about: Any allergies you have. All medicines you are taking, including vitamins, herbs, eye drops, creams, and  over-the-counter medicines. Any problems you or family members have had with anesthesia. Any bleeding problems you have. Any surgeries you have had. Any medical conditions or illnesses you have. This includes sleep apnea, cough, fever, or the flu. Whether you are pregnant or may be pregnant. Whether you use cigarettes, alcohol, or drugs. Any use of steroids, whether by mouth or as a cream. What are the risks? Your health care provider will talk with you about risks. These may include: Getting too much medicine (oversedation). Nausea. Allergic reactions to medicines. Trouble breathing. If this happens, a breathing tube may be used to help you breathe. It will be removed when you are awake and breathing on your own. Heart trouble. Lung trouble. Confusion that gets better with time (emergence delirium). What happens before the procedure? When to stop eating and drinking Follow instructions from your health care provider about what you may eat and drink. These may include: 8 hours before your procedure Stop eating most foods. Do not eat meat, fried foods, or fatty foods. Eat only light foods, such as toast or crackers. All liquids are okay except energy drinks and alcohol. 6 hours before your procedure Stop eating. Drink only clear liquids, such as water, clear fruit juice, black coffee, plain tea, and sports drinks. Do not drink energy drinks or alcohol. 2 hours before your procedure Stop drinking all liquids. You may be allowed to take medicines with small sips of water. If you do not follow your health care provider's instructions, your procedure may be delayed or canceled. Medicines Ask your health care provider about: Changing or stopping your regular medicines. These include any diabetes medicines or blood thinners you take. Taking medicines such as aspirin and ibuprofen. These medicines can thin your blood. Do not take them unless your health care provider tells you to. Taking  over-the-counter medicines, vitamins, herbs, and supplements. Testing You may have an exam or testing. You may have a blood or urine sample taken. General instructions Do not use any products that contain nicotine or tobacco for at least 4 weeks before the procedure. These products include cigarettes, chewing tobacco, and vaping devices, such as e-cigarettes. If you need help quitting, ask your health care provider. If you will be going home right after the procedure, plan to have a responsible adult: Take you home from the hospital or clinic. You will not be allowed to drive. Care for you for the time you are told. What happens during the procedure?  Your blood pressure, heart rate, breathing, level of pain, and blood oxygen level will be monitored. An IV will be inserted into one of your veins. You may be given: A sedative. This helps you relax. Anesthesia. This will: Numb certain areas of your  body. Make you fall asleep for surgery. You will be given medicines as needed to keep you comfortable. The more medicine you are given, the deeper your level of sedation will be. Your level of sedation may be changed to fit your needs. There are three levels of sedation: Mild sedation. At this level, you may feel awake and relaxed. You will be able to follow directions. Moderate sedation. At this level, you will be sleepy. You may not remember the procedure. Deep sedation. At this level, you will be asleep. You will not remember the procedure. How you get the medicines will depend on your age and the procedure. They may be given as: A pill. This may be taken by mouth (orally) or inserted into the rectum. An injection. This may be into a vein or muscle. A spray through the nose. After your procedure is over, the medicine will be stopped. The procedure may vary among health care providers and hospitals. What happens after the procedure? Your blood pressure, heart rate, breathing rate, and blood  oxygen level will be monitored until you leave the hospital or clinic. You may feel sleepy, clumsy, or nauseous. You may not remember what happened during or after the procedure. Sedation can affect you for several hours. Do not drive or use machinery until your health care provider says that it is safe. This information is not intended to replace advice given to you by your health care provider. Make sure you discuss any questions you have with your health care provider. Document Revised: 08/12/2021 Document Reviewed: 08/12/2021 Elsevier Patient Education  2024 [employer].

## 2022-12-29 ENCOUNTER — Encounter (HOSPITAL_COMMUNITY)
Admission: RE | Admit: 2022-12-29 | Discharge: 2022-12-29 | Disposition: A | Discharge status: Home / Self Care | Payer: Medicare HMO | Source: Ambulatory Visit | Attending: Gastroenterology | Admitting: Gastroenterology

## 2022-12-29 ENCOUNTER — Encounter (HOSPITAL_COMMUNITY): Payer: Self-pay

## 2022-12-29 HISTORY — DX: Dyspnea, unspecified: R06.00

## 2022-12-30 ENCOUNTER — Encounter (HOSPITAL_COMMUNITY)
Admission: RE | Disposition: A | Discharge status: Home / Self Care | Payer: Self-pay | Source: Home / Self Care | Attending: Gastroenterology

## 2022-12-30 ENCOUNTER — Ambulatory Visit (HOSPITAL_COMMUNITY)
Admission: RE | Admit: 2022-12-30 | Discharge: 2022-12-30 | Disposition: A | Discharge status: Home / Self Care | Payer: Medicare HMO | Attending: Gastroenterology | Admitting: Gastroenterology

## 2022-12-30 ENCOUNTER — Ambulatory Visit (HOSPITAL_COMMUNITY): Payer: Medicare HMO | Admitting: Anesthesiology

## 2022-12-30 ENCOUNTER — Encounter (HOSPITAL_COMMUNITY): Payer: Self-pay | Admitting: Gastroenterology

## 2022-12-30 DIAGNOSIS — E039 Hypothyroidism, unspecified: Secondary | ICD-10-CM | POA: Diagnosis not present

## 2022-12-30 DIAGNOSIS — R109 Unspecified abdominal pain: Secondary | ICD-10-CM | POA: Diagnosis present

## 2022-12-30 DIAGNOSIS — K2950 Unspecified chronic gastritis without bleeding: Secondary | ICD-10-CM | POA: Insufficient documentation

## 2022-12-30 DIAGNOSIS — I341 Nonrheumatic mitral (valve) prolapse: Secondary | ICD-10-CM | POA: Diagnosis not present

## 2022-12-30 DIAGNOSIS — Z85828 Personal history of other malignant neoplasm of skin: Secondary | ICD-10-CM | POA: Insufficient documentation

## 2022-12-30 DIAGNOSIS — K3184 Gastroparesis: Secondary | ICD-10-CM | POA: Insufficient documentation

## 2022-12-30 DIAGNOSIS — Z79890 Hormone replacement therapy (postmenopausal): Secondary | ICD-10-CM | POA: Diagnosis not present

## 2022-12-30 DIAGNOSIS — K219 Gastro-esophageal reflux disease without esophagitis: Secondary | ICD-10-CM | POA: Diagnosis not present

## 2022-12-30 HISTORY — PX: ESOPHAGOGASTRODUODENOSCOPY (EGD) WITH PROPOFOL: SHX5813

## 2022-12-30 HISTORY — PX: BIOPSY: SHX5522

## 2022-12-30 SURGERY — ESOPHAGOGASTRODUODENOSCOPY (EGD) WITH PROPOFOL
Anesthesia: General

## 2022-12-30 MED ORDER — PHENYLEPHRINE 80 MCG/ML (10ML) SYRINGE FOR IV PUSH (FOR BLOOD PRESSURE SUPPORT)
PREFILLED_SYRINGE | INTRAVENOUS | Status: AC
  Filled 2022-12-30: qty 10

## 2022-12-30 MED ORDER — LACTATED RINGERS IV SOLN
INTRAVENOUS | Status: DC | PRN
Start: 2022-12-30 — End: 2022-12-30

## 2022-12-30 MED ORDER — LIDOCAINE HCL (CARDIAC) PF 100 MG/5ML IV SOSY
PREFILLED_SYRINGE | INTRAVENOUS | Status: DC | PRN
  Administered 2022-12-30: 100 mg via INTRAVENOUS

## 2022-12-30 MED ORDER — EPHEDRINE 5 MG/ML INJ
INTRAVENOUS | Status: AC
  Filled 2022-12-30: qty 5

## 2022-12-30 MED ORDER — EPHEDRINE SULFATE-NACL 50-0.9 MG/10ML-% IV SOSY
PREFILLED_SYRINGE | INTRAVENOUS | Status: DC | PRN
Start: 2022-12-30 — End: 2022-12-30
  Administered 2022-12-30: 10 mg via INTRAVENOUS

## 2022-12-30 MED ORDER — PROPOFOL 10 MG/ML IV BOLUS
INTRAVENOUS | Status: DC | PRN
  Administered 2022-12-30: 40 mg via INTRAVENOUS
  Administered 2022-12-30: 100 mg via INTRAVENOUS

## 2022-12-30 MED ORDER — PROPOFOL 500 MG/50ML IV EMUL
INTRAVENOUS | Status: DC | PRN
  Administered 2022-12-30: 150 ug/kg/min via INTRAVENOUS

## 2022-12-30 MED ORDER — LIDOCAINE HCL (PF) 2 % IJ SOLN
INTRAMUSCULAR | Status: AC
  Filled 2022-12-30: qty 5

## 2022-12-30 MED ORDER — PROPOFOL 500 MG/50ML IV EMUL
INTRAVENOUS | Status: AC
  Filled 2022-12-30: qty 50

## 2022-12-30 NOTE — Discharge Instructions (Signed)
You are being discharged to home.  Resume your previous diet.  We are waiting for your pathology results.  Consider starting domperidone for gastroparesis.

## 2022-12-30 NOTE — Anesthesia Preprocedure Evaluation (Addendum)
Anesthesia Evaluation  Patient identified by MRN, date of birth, ID band Patient awake    Reviewed: Allergy & Precautions, H&P , NPO status , Patient's Chart, lab work & pertinent test results  Airway Mallampati: II  TM Distance: >3 FB Neck ROM: Full    Dental  (+) Caps, Implants, Dental Advisory Given   Pulmonary neg pulmonary ROS, neg shortness of breath   Pulmonary exam normal breath sounds clear to auscultation       Cardiovascular negative cardio ROS Normal cardiovascular exam+ Valvular Problems/Murmurs (h/o asymptomatic heart murmur, ECHO in past normal, as per patient)  Rhythm:Regular Rate:Normal     Neuro/Psych TIA (transient speech difficulty, no sequelae)CVA  negative psych ROS   GI/Hepatic Neg liver ROS,GERD  ,,  Endo/Other  Hypothyroidism (on replacement)    Renal/GU negative Renal ROS     Musculoskeletal  (+) Arthritis , Osteoarthritis,    Abdominal Normal abdominal exam  (+)   Peds  Hematology   Anesthesia Other Findings   Reproductive/Obstetrics                             Anesthesia Physical Anesthesia Plan  ASA: 3  Anesthesia Plan: General   Post-op Pain Management: Minimal or no pain anticipated   Induction: Intravenous  PONV Risk Score and Plan:   Airway Management Planned: Nasal Cannula and Natural Airway  Additional Equipment:   Intra-op Plan:   Post-operative Plan:   Informed Consent: I have reviewed the patients History and Physical, chart, labs and discussed the procedure including the risks, benefits and alternatives for the proposed anesthesia with the patient or authorized representative who has indicated his/her understanding and acceptance.     Dental advisory given  Plan Discussed with: CRNA  Anesthesia Plan Comments:         Anesthesia Quick Evaluation

## 2022-12-30 NOTE — Transfer of Care (Signed)
Immediate Anesthesia Transfer of Care Note  Patient: Grace [name]  Procedure(s) Performed: ESOPHAGOGASTRODUODENOSCOPY (EGD) WITH PROPOFOL BIOPSY  Patient Location: Short Stay  Anesthesia Type:General  Level of Consciousness: drowsy and patient cooperative  Airway & Oxygen Therapy: Patient Spontanous Breathing and Patient connected to nasal cannula oxygen  Post-op Assessment: Report given to RN and Post -op Vital signs reviewed and stable  Post vital signs: Reviewed and stable  Last Vitals:  Vitals Value Taken Time  BP 117/43 12/30/22 1342  Temp 36.6 C 12/30/22 1342  Pulse 64 12/30/22 1342  Resp 16 12/30/22 1342  SpO2 100 % 12/30/22 1342    Last Pain:  Vitals:   12/30/22 1342  TempSrc: Axillary  PainSc: 0-No pain         Complications: No notable events documented.

## 2022-12-30 NOTE — Anesthesia Postprocedure Evaluation (Signed)
Anesthesia Post Note  Patient: Grace [name]  Procedure(s) Performed: ESOPHAGOGASTRODUODENOSCOPY (EGD) WITH PROPOFOL BIOPSY  Patient location during evaluation: PACU Anesthesia Type: General Level of consciousness: awake and alert Pain management: pain level controlled Vital Signs Assessment: post-procedure vital signs reviewed and stable Respiratory status: spontaneous breathing, nonlabored ventilation, respiratory function stable and patient connected to nasal cannula oxygen Cardiovascular status: blood pressure returned to baseline and stable Postop Assessment: no apparent nausea or vomiting Anesthetic complications: no   There were no known notable events for this encounter.   Last Vitals:  Vitals:   12/30/22 1109 12/30/22 1342  BP: 137/69 (!) 117/43  Pulse: 66 64  Resp: 14 16  Temp: (!) 36.4 C 36.6 C  SpO2: 100% 100%    Last Pain:  Vitals:   12/30/22 1342  TempSrc: Axillary  PainSc: 0-No pain                 Trisha Morandi L Yarexi Pawlicki

## 2022-12-30 NOTE — Op Note (Addendum)
[facility] Patient Name: Grace [name] Procedure Date: 12/30/2022 1:14 PM MRN: [id number] Date of Birth: Dec 03, 1945 Attending MD: Katrinka Blazing , , [id number] CSN: [id number] Age: 77 Admit Type: Outpatient Procedure:                Upper GI endoscopy Indications:              Abdominal pain, Gastroparesis Providers:                Katrinka Blazing, Angelica Ran, Elinor Parkinson Referring MD:              Medicines:                Monitored Anesthesia Care Complications:            No immediate complications. Estimated Blood Loss:     Estimated blood loss: none. Procedure:                Pre-Anesthesia Assessment:                           - Prior to the procedure, a History and Physical                            was performed, and patient medications, allergies                            and sensitivities were reviewed. The patient's                            tolerance of previous anesthesia was reviewed.                           - The risks and benefits of the procedure and the                            sedation options and risks were discussed with the                            patient. All questions were answered and informed                            consent was obtained.                           - ASA Grade Assessment: II - A patient with mild                            systemic disease.                           After obtaining informed consent, the endoscope was                            passed under direct vision. Throughout the                            procedure, the patient's blood pressure,  pulse, and                            oxygen saturations were monitored continuously. The                            [id number]-H190 ([id number]) scope was introduced through the                            mouth, and advanced to the second part of duodenum.                            The upper GI endoscopy was accomplished without                            difficulty. The  patient tolerated the procedure                            well. Scope In: 1:28:07 PM Scope Out: 1:33:16 PM Total Procedure Duration: 0 hours 5 minutes 9 seconds  Findings:      The examined esophagus was normal.      The gastroesophageal flap valve was visualized endoscopically and       classified as Hill Grade I (prominent fold, tight to endoscope).      The entire examined stomach was normal. Biopsies were taken with a cold       forceps for Helicobacter pylori testing.      The examined duodenum was normal. Impression:               - Normal esophagus.                           - Normal stomach. Biopsied.                           - Normal examined duodenum. Moderate Sedation:      Per Anesthesia Care Recommendation:           - Discharge patient to home (ambulatory).                           - Resume previous diet.                           - Await pathology results.                           - Consider starting domperidone for gastroparesis. Procedure Code(s):        --- Professional ---                           [id number], Esophagogastroduodenoscopy, flexible,                            transoral; with biopsy, single or multiple Diagnosis Code(s):        --- Professional ---  R10.9, Unspecified abdominal pain                           K31.84, Gastroparesis CPT copyright 2022 American Medical Association. All rights reserved. The codes documented in this report are preliminary and upon coder review may  be revised to meet current compliance requirements. Katrinka Blazing, MD Katrinka Blazing,  12/30/2022 1:40:56 PM This report has been signed electronically. Number of Addenda: 0

## 2022-12-30 NOTE — Interval H&P Note (Signed)
History and Physical Interval Note:  12/30/2022 12:01 PM  Grace [name]  has presented today for surgery, with the diagnosis of Gastroparesis.  The various methods of treatment have been discussed with the patient and family. After consideration of risks, benefits and other options for treatment, the patient has consented to  Procedure(s) with comments: ESOPHAGOGASTRODUODENOSCOPY (EGD) WITH PROPOFOL (N/A) - 12:45 pm, asa 3 as a surgical intervention.  The patient's history has been reviewed, patient examined, no change in status, stable for surgery.  I have reviewed the patient's chart and labs.  Questions were answered to the patient's satisfaction.     Katrinka Blazing Mayorga

## 2023-01-02 ENCOUNTER — Encounter (INDEPENDENT_AMBULATORY_CARE_PROVIDER_SITE_OTHER): Payer: Self-pay | Admitting: *Deleted

## 2023-01-05 LAB — SURGICAL PATHOLOGY

## 2023-01-08 ENCOUNTER — Encounter (HOSPITAL_COMMUNITY): Payer: Self-pay | Admitting: Gastroenterology

## 2023-01-27 ENCOUNTER — Ambulatory Visit (INDEPENDENT_AMBULATORY_CARE_PROVIDER_SITE_OTHER): Payer: Medicare HMO | Admitting: Gastroenterology

## 2023-02-08 NOTE — Progress Notes (Unsigned)
Grace [name], female    DOB: 1946/01/21    MRN: [id number]   Brief patient profile:  8  yowf never smoker but heavy passive exp  referred to pulmonary clinic in [place]  11/07/2022 by Randall An  for breathing problems assoc with palpitations and abn ct chest  Onset ? 2023 not necessarily proportionate to exertion  - CT s contrast 10/09/22 unremarkable as to cause for doe == 10/09/22 Scarring/atelectasis in the lingula. Mild subpleural patchy opacity/reticulation in the posterior right ung base, unchanged, likely reflecting post infectious inflammatory scarring. - Echo 10/29/22  mild AR unchanged from priors - 11/07/2022   Walked on RA  x  3  lap(s) =  approx 450  ft  @ brisk pace, stopped due to end of study with lowest 02 sats 94%      History of Present Illness  11/07/2022  Pulmonary/ 1st office eval/ Mirenda Baltazar / [place] [facility]  Chief Complaint  Patient presents with   Establish Care   Shortness of Breath  Dyspnea:  mostly just with steps, not with housework/ mostly notices sob at resp > exertion and ex brings on fatigue more than sob  Cough: none  Sleep: fine/ one pillow no resp cc  SABA use: none  Rec To get the most out of exercise, you need to be continuously aware that you are short of breath, but never out of breath, for at least 30 minutes daily.     12/30/22  nn EGD and neg H pylori     02/09/2023  f/u ov/[place] office/Sabriya Yono re: sob ? Etiology  maint on no resp   Chief Complaint  Patient presents with   Dyspnea on exertion  Dyspnea:  not with exertion/ main concern is resting palpitations /ex fatigue > sob  Limited sometimes by L back pain esp in am with stiffness but improves with activity Has not tried sub max ex/ 02 monitoring yet  Cough: clearing throat, pnds  x years  Sleeping: bed is flat one pillow R side s    resp cc  SABA use: none  02: none      No obvious day to day or daytime variability or assoc excess/ purulent sputum or mucus plugs or  hemoptysis or cp or chest tightness, subjective wheeze  or hb symptoms.    Also denies any obvious fluctuation of symptoms with weather or environmental changes or other aggravating or alleviating factors except as outlined above   No unusual exposure hx or h/o childhood pna/ asthma or knowledge of premature birth.  Current Allergies, Complete Past Medical History, Past Surgical History, Family History, and Social History were reviewed in [employer] record.  ROS  The following are not active complaints unless bolded Hoarseness, sore throat, dysphagia/ very mild  globus, dental problems, itching, sneezing,  nasal congestion or sense of discharge of excess mucus or purulent secretions, ear ache,   fever, chills, sweats, unintended wt loss or wt gain, classically pleuritic or exertional cp,  orthopnea pnd or arm/hand swelling  or leg swelling, presyncope, palpitations, abdominal pain, anorexia, nausea, vomiting, diarrhea  or change in bowel habits or change in bladder habits, change in stools or change in urine, dysuria, hematuria,  rash, arthralgias, visual complaints, headache, numbness, weakness or ataxia or problems with walking or coordination,  change in mood or  memory.        Current Outpatient Medications  Medication Instructions   ALPRAZolam (XANAX) 0.25 MG tablet Take 1/2-1 tablet po BID  prn anxiety. CAUTION:DROWSINESS   aspirin EC 81 mg, Oral, Weekly, Swallow whole.   ezetimibe (ZETIA) 10 MG tablet TAKE ONE TABLET BY MOUTH ONCE DAILY.   levothyroxine (SYNTHROID) 75 MCG tablet TAKE 1 TABLET TUESDAY TO   SUNDAY AND 1/2 TABLET ON   MONDAY   Multiple Vitamins-Minerals (ALIVE WOMENS 50+ PO) 1 tablet, Oral, Daily         Past Medical History:  Diagnosis Date   Dry eyes, bilateral    Frequency of urination    Heart murmur    Hyperlipidemia    Hypothyroid    Migraine    Mitral valve prolapse    Skin cancer    Stroke [facility])    TIA (transient ischemic attack)  02/19/2012      Objective:     Wt Readings from Last 3 Encounters:  02/09/23 122 lb (55.3 kg)  12/30/22 123 lb (55.8 kg)  12/22/22 123 lb 9.6 oz (56.1 kg)      Vital signs reviewed  02/09/2023  - Note at rest 02 sats  96% on RA   General appearance:    pleasant healthy appearing wf nad   HEENT : Oropharynx  clear      Nasal turbinates nl    NECK :  without  apparent JVD/ palpable Nodes/TM    LUNGS: no acc muscle use,  Nl contour chest which is clear to A and P bilaterally without cough on insp or exp maneuvers   CV:  RRR  no s3 or murmur or increase in P2, and no edema   ABD:  soft and nontender with nl inspiratory excursion in the supine position. No bruits or organomegaly appreciated   MS:  Nl gait/ ext warm without deformities Or obvious joint restrictions  calf tenderness, cyanosis or clubbing    SKIN: warm and dry without lesions    NEURO:  alert, approp, nl sensorium with  no motor or cerebellar deficits apparent.         Labs   reviewed:      Chemistry      Component Value Date/Time   NA 136 11/07/2022 1434   K 3.8 11/07/2022 1434   CL 99 11/07/2022 1434   CO2 21 11/07/2022 1434   BUN 6 (L) 11/07/2022 1434   CREATININE 0.67 11/07/2022 1434   CREATININE 0.68 01/20/2019 0946      Component Value Date/Time   CALCIUM 9.2 11/07/2022 1434   ALKPHOS 67 09/23/2021 1604   AST 25 09/23/2021 1604   ALT 16 09/23/2021 1604   BILITOT 0.7 09/23/2021 1604        Lab Results  Component Value Date   WBC 7.9 11/07/2022   HGB 13.2 11/07/2022   HCT 39.4 11/07/2022   MCV 91 11/07/2022   PLT 258 11/07/2022     Lab Results  Component Value Date   DDIMER 0.39 11/07/2022      Lab Results  Component Value Date   TSH 3.720 11/07/2022         Lab Results  Component Value Date   ESRSEDRATE 5 11/07/2022   ESRSEDRATE 2 07/15/2021   ESRSEDRATE 9 03/09/2019          Assessment

## 2023-02-09 ENCOUNTER — Ambulatory Visit: Payer: Medicare HMO | Admitting: Internal Medicine

## 2023-02-09 ENCOUNTER — Encounter: Payer: Self-pay | Admitting: Internal Medicine

## 2023-02-09 VITALS — BP 122/68 | HR 74 | Ht 61.0 in | Wt 122.0 lb

## 2023-02-09 DIAGNOSIS — R0609 Other forms of dyspnea: Secondary | ICD-10-CM | POA: Diagnosis not present

## 2023-02-09 NOTE — Assessment & Plan Note (Addendum)
Onset ? 2023 not necessarily proportionate to exertion  - CT s contrast 10/09/22 unremarkable as to cause for doe  - Echo 10/29/22  mild AR unchanged from priors with G1 diastolic dysfunction and nl LA - 11/07/2022   Walked on RA  x  3  lap(s) =  approx 450  ft  @ brisk pace, stopped due to end of study with lowest 02 sats 94%  - Allergy screen  11/07/22   Eos 0.1 /  IgE 30   No pulmonary problems identified and her main concern is resting problems (palpitations and back stiffness and fatigue) all of which improve with ex so rec:  To get the most out of exercise, you need to be continuously aware that you are short of breath, but never out of breath, for at least 30 minutes daily. As you improve, it will actually be easier for you to do the same amount of exercise  in  30 minutes so always push to the level where you are short of breath.    Make sure you check your oxygen saturations at highest level of activity    >>> F/u re palpitations/ mild AR with cards and here prn   >>> F/u with ENT prn for sensation of globus/ throat clearing (given neg allergy screen and nl EGD already done)          Each maintenance medication was reviewed in detail including emphasizing most importantly the difference between maintenance and prns and under what circumstances the prns are to be triggered using an action plan format where appropriate.  Total time for H and P, chart review, counseling, reviewing studies done to date including her EGD findings  and generating customized AVS unique to this office visit / same day charting = 33 min final summary f/u ov

## 2023-02-09 NOTE — Patient Instructions (Signed)
You need a pulse oximeter   To get the most out of exercise, you need to be continuously aware that you are short of breath, but never out of breath, for at least 30 minutes daily. As you improve, it will actually be easier for you to do the same amount of exercise  in  30 minutes so always push to the level where you are short of breath.     Make sure you check your oxygen saturations at highest level of activity > goal is to keep well above 90% and normal is 94-95%   Call losing ground with your exercise tolerance or oxygen level

## 2023-02-23 ENCOUNTER — Other Ambulatory Visit (HOSPITAL_COMMUNITY): Payer: Self-pay | Admitting: Internal Medicine

## 2023-02-23 DIAGNOSIS — Z1231 Encounter for screening mammogram for malignant neoplasm of breast: Secondary | ICD-10-CM

## 2023-03-09 ENCOUNTER — Ambulatory Visit: Payer: Medicare HMO | Attending: Cardiology | Admitting: Cardiology

## 2023-03-09 ENCOUNTER — Encounter: Payer: Self-pay | Admitting: Cardiology

## 2023-03-09 VITALS — BP 126/72 | HR 74 | Ht 61.0 in | Wt 123.4 lb

## 2023-03-09 DIAGNOSIS — R002 Palpitations: Secondary | ICD-10-CM

## 2023-03-09 DIAGNOSIS — E782 Mixed hyperlipidemia: Secondary | ICD-10-CM | POA: Diagnosis not present

## 2023-03-09 DIAGNOSIS — M791 Myalgia, unspecified site: Secondary | ICD-10-CM | POA: Diagnosis not present

## 2023-03-09 DIAGNOSIS — T466X5D Adverse effect of antihyperlipidemic and antiarteriosclerotic drugs, subsequent encounter: Secondary | ICD-10-CM

## 2023-03-09 DIAGNOSIS — I351 Nonrheumatic aortic (valve) insufficiency: Secondary | ICD-10-CM | POA: Diagnosis not present

## 2023-03-09 NOTE — Progress Notes (Signed)
Cardiology Office Note  Date: 03/09/2023   ID: EVEREST LOO, DOB 04-15-1945, MRN [id number]  History of Present Illness: Grace [name] is a 77 y.o. [name] last seen in June 2024 by Ms. Strader PA-C, I reviewed the note.  She is here for a follow-up visit.  States that overall she has been doing fairly well.  She reports chronic fatigue, but remains functional with ADLs and reports no progressive shortness of breath.  Still describes intermittent palpitations, no progressive symptoms or syncope.  She remains comfortable with observation.  We discussed the results of her last cardiac monitor and also follow-up echocardiogram.  She did have a visit with Dr. Sherene Sires, I reviewed the note.  I reviewed her medications.  Regimen includes aspirin and Zetia as before.  I went over her most recent lab work.  Physical Exam: VS:  BP 126/72   Pulse 74   Ht 5\' 1"  (1.549 m)   Wt 123 lb 6.4 oz (56 kg)   SpO2 99%   BMI 23.32 kg/m , BMI Body mass index is 23.32 kg/m.  Wt Readings from Last 3 Encounters:  03/09/23 123 lb 6.4 oz (56 kg)  02/09/23 122 lb (55.3 kg)  12/30/22 123 lb (55.8 kg)    General: Patient appears comfortable at rest. HEENT: Conjunctiva and lids normal. Neck: Supple, no elevated JVP or carotid bruits. Lungs: Clear to auscultation, nonlabored breathing at rest. Cardiac: Regular rate and rhythm, no S3 or significant systolic murmur. Extremities: No pitting edema.  ECG:  An ECG dated 09/19/2022 was personally reviewed today and demonstrated:  Sinus rhythm with leftward axis, R' in lead V1 and V2 with decreased R wave progression.  Labwork: June 2024: Cholesterol 159, triglycerides 109, HDL 46, LDL 93 11/07/2022: BUN 6; Creatinine, Ser 0.67; Hemoglobin 13.2; Platelets 258; Potassium 3.8; Sodium 136; TSH 3.720, ESR 5    Component Value Date/Time   CHOL 177 07/15/2021 1020   TRIG 78 07/15/2021 1020   HDL 55 07/15/2021 1020   CHOLHDL 3.2 07/15/2021 1020   CHOLHDL 3.9 08/24/2019  1604   VLDL 35 08/24/2019 1604   LDLCALC 107 (H) 07/15/2021 1020   Other Studies Reviewed Today:  Echocardiogram 10/29/2022:  1. Left ventricular ejection fraction, by estimation, is 60 to 65%. The  left ventricle has normal function. The left ventricle has no regional  wall motion abnormalities. Left ventricular diastolic parameters are  consistent with Grade I diastolic  dysfunction (impaired relaxation).   2. Right ventricular systolic function is normal. The right ventricular  size is normal. There is normal pulmonary artery systolic pressure.   3. The mitral valve is normal in structure. No evidence of mitral valve  regurgitation. No evidence of mitral stenosis.   4. The tricuspid valve is abnormal.   5. The aortic valve is tricuspid. Aortic valve regurgitation is mild. No  aortic stenosis is present.   6. The inferior vena cava is normal in size with greater than 50%  respiratory variability, suggesting right atrial pressure of 3 mmHg.   Assessment and Plan:  1.  History of palpitations with cardiac monitor in June 2023 demonstrating rare atrial and ventricular ectopy with single brief episode of SVT.  No sustained arrhythmias or documented atrial fibrillation at that time.  She reports no progressive symptoms, no associated syncope.  Continue with observation for now.  Did discuss the possibility of adding low-dose beta-blocker if symptoms worsen.  2.  Aortic regurgitation, mild by echocardiogram in July of this year.  LVEF 60 to 65% with normal chamber size.  3.  History of normal coronary arteries documented at cardiac catheterization in 2019.  4.  Mixed hyperlipidemia, LDL 93 in June of this year.  She has a history of statin myalgias.  Continue Zetia.  5.  Small right lower lobe pulmonary nodules up to 4 mm, stable by chest CT in July of this year.  Evidence of likely postinfectious inflammatory scarring in the posterior right lung base as well.  Disposition:  Follow up  1  year.  Signed, Jonelle Sidle, M.D., F.A.C.C. [facility] at [facility]

## 2023-03-09 NOTE — Patient Instructions (Signed)
Medication Instructions:  Your physician recommends that you continue on your current medications as directed. Please refer to the Current Medication list given to you today.   Labwork: None today  Testing/Procedures: None today  Follow-Up: 1 year  Any Other Special Instructions Will Be Listed Below (If Applicable).  If you need a refill on your cardiac medications before your next appointment, please call your pharmacy.  

## 2023-03-23 ENCOUNTER — Ambulatory Visit (HOSPITAL_COMMUNITY)
Admission: RE | Admit: 2023-03-23 | Discharge: 2023-03-23 | Disposition: A | Discharge status: Home / Self Care | Payer: Medicare HMO | Source: Ambulatory Visit | Attending: Internal Medicine | Admitting: Internal Medicine

## 2023-03-23 ENCOUNTER — Encounter (HOSPITAL_COMMUNITY): Payer: Self-pay

## 2023-03-23 DIAGNOSIS — Z1231 Encounter for screening mammogram for malignant neoplasm of breast: Secondary | ICD-10-CM | POA: Insufficient documentation

## 2023-06-22 ENCOUNTER — Encounter (INDEPENDENT_AMBULATORY_CARE_PROVIDER_SITE_OTHER): Payer: Self-pay | Admitting: Gastroenterology

## 2023-06-22 ENCOUNTER — Ambulatory Visit (INDEPENDENT_AMBULATORY_CARE_PROVIDER_SITE_OTHER): Payer: Medicare HMO | Admitting: Gastroenterology

## 2023-06-22 VITALS — BP 163/71 | HR 60 | Temp 98.1°F | Ht 61.0 in | Wt 120.4 lb

## 2023-06-22 DIAGNOSIS — K581 Irritable bowel syndrome with constipation: Secondary | ICD-10-CM

## 2023-06-22 DIAGNOSIS — R1013 Epigastric pain: Secondary | ICD-10-CM

## 2023-06-22 DIAGNOSIS — K921 Melena: Secondary | ICD-10-CM

## 2023-06-22 DIAGNOSIS — R110 Nausea: Secondary | ICD-10-CM

## 2023-06-22 DIAGNOSIS — R5383 Other fatigue: Secondary | ICD-10-CM

## 2023-06-22 DIAGNOSIS — R6881 Early satiety: Secondary | ICD-10-CM

## 2023-06-22 DIAGNOSIS — K3184 Gastroparesis: Secondary | ICD-10-CM

## 2023-06-22 MED ORDER — SUCRALFATE 1 G PO TABS: 1.0000 g | ORAL_TABLET | Freq: Three times a day (TID) | ORAL | 0 refills | Status: DC

## 2023-06-22 NOTE — Patient Instructions (Signed)
 We will check stool cards x3 to look for blood given black stools I am sending carafate 1g to take 4 times per day, please dissolve tablet in 30ml of water, mix and drink We will check blood counts to ensure no anemia As discussed, lexapro can cause some of these symptoms when you first start it, your history of gastroparesis could also be contributing but we will evaluate for other causes with the above I am providing info on domperidone which may help with your gastroparesis  Follow up 6-8 weeks  It was a pleasure to see you today. I want to create trusting relationships with patients and provide genuine, compassionate, and quality care. I truly value your feedback! please be on the lookout for a survey regarding your visit with me today. I appreciate your input about our visit and your time in completing this!    Judi Jaffe L. Jeanmarie Hubert, MSN, APRN, AGNP-C Adult-Gerontology Nurse Practitioner [facility] at [facility]

## 2023-06-22 NOTE — Progress Notes (Unsigned)
 Referring Provider: Toma Deiters, MD Primary Care Physician:  Toma Deiters, MD Primary GI Physician: Dr. Levon Hedger   Chief Complaint  Patient presents with   Abdominal Pain    Here for a follow up but has concerns about upper mid abdominal pain that started about one week ago pain. No appetite. Nausea.    HPI:   Grace [name] is a 78 y.o. female with past medical history of chronic constipation, gastroparesis, heart failure with diastolic dysfunction, hyperlipidemia, hypothyroidism, stroke, mitral prolapse   Patient presenting today for:  Follow up of gastroparesis, abdominal pain, nausea  Last seen in September. At that time reported frequent fullness, early satiety. Some nausea but no vomiting. Taking miralax 2t daily with a small amount of BMs, having to strain to defecate.  Recommended domperidone 5mg  TID, continue mrialax, schedule EGD, pt to make [place] aware of interested in anorectal manometry at [facility]  Present: Patient states she has had a lot of nausea for the past week or so. No vomiting. She notes that she has early satiety and feels that she is not really hungry. She has some discomfort in epigastric area with no alleviating factors. She is not taking anything currently for her gastroparesis or her nausea. She denies any heartburn or acid regurgitation but She notes some burning in her stomach at times.   She is still having issues with constipation, notes she had a darker stool last week, has not had any BMs since then. She feels very fatigued, having some off balance if she turns too quickly.  She is not taking any NSAIDs, other than baby ASA daily.  She did take some pepto bismol today but denies any prior to black stool. She is taking about 1/4 t of miralax as she note she will have more diarrhea if she takes more than this. She thinks she is having 2 BMs per week on average. She feels that she defecate pretty easily when she does go. She does note that she defecates  smaller amounts, does not always feels he empties out well.    Gastric emptying study: 11/2019  12% emptied at 1 hr ( normal >= 10%) 17% emptied at 2 hr ( normal >= 40%) 21% emptied at 3 hr ( normal >= 70%) 25% emptied at 4 hr ( normal >= 90%)   IMPRESSION: Significantly delayed gastric emptying study.   Last EGD:12/2022  Normal esophagus.                           - Normal stomach. Biopsied.                           - Normal examined duodenum   A. STOMACH, BIOPSY:       Gastric antral mucosa with chronic inactive gastritis.       Immunohistochemical stain for H. pylori will be reported in an  addendum.      Negative for intestinal metaplasia or dysplasia.  Last Colonoscopy: 03/2021, performed by Dr. Karilyn Cota Normal colon, external hemorrhoids.   Filed Weights   06/22/23 1450  Weight: 120 lb 6.4 oz (54.6 kg)     Past Medical History:  Diagnosis Date   Dry eyes, bilateral    Dyspnea    Frequency of urination    Heart murmur    Hyperlipidemia    Hypothyroid    Migraine    Mitral valve prolapse  Skin cancer    Stroke [facility])    TIA (transient ischemic attack) 02/19/2012    Past Surgical History:  Procedure Laterality Date   BALLOON DILATION N/A 07/28/2013   Procedure: BALLOON DILATION;  Surgeon: Malissa Hippo, MD;  [facility];  Service: Endoscopy;  Laterality: N/A;   BIOPSY  02/16/2020   Procedure: BIOPSY;  Surgeon: Malissa Hippo, MD;  [facility];  Service: Endoscopy;;   BIOPSY  12/30/2022   Procedure: BIOPSY;  Surgeon: Dolores Frame, MD;  [facility];  Service: Gastroenterology;;   BREAST BIOPSY Left 04/09/2022   BENIGN BREAST PARENCHYMA WITH FIBROADENOMATOID CHANGES AND CALCIFICATION   BREAST EXCISIONAL BIOPSY Left    benign-pt unsure when   CATARACT EXTRACTION W/PHACO Right 07/11/2013   Procedure: CATARACT EXTRACTION PHACO AND INTRAOCULAR LENS PLACEMENT (IOC);  Surgeon: Gemma Payor, MD;  Location: [facility];   Service: Ophthalmology;  Laterality: Right;  CDE 9.99   CATARACT EXTRACTION W/PHACO Left 08/04/2013   Procedure: CATARACT EXTRACTION PHACO AND INTRAOCULAR LENS PLACEMENT (IOC);  Surgeon: Gemma Payor, MD;  [facility]: [facility];  Service: Ophthalmology;  Laterality: Left;  CDE:16.26   CHOLECYSTECTOMY     COLONOSCOPY  12/18/2010   Procedure: COLONOSCOPY;  Surgeon: Malissa Hippo, MD;  [facility];  Service: Endoscopy;  Laterality: N/A;  1:00 pm   COLONOSCOPY N/A 03/06/2016   Procedure: COLONOSCOPY;  Surgeon: Malissa Hippo, MD;  [facility];  Service: Endoscopy;  Laterality: N/A;  1200   COLONOSCOPY N/A 04/18/2021   Procedure: COLONOSCOPY;  Surgeon: Malissa Hippo, MD;  [facility];  Service: Endoscopy;  Laterality: N/A;  805   ESOPHAGEAL DILATION N/A 01/27/2018   Procedure: ESOPHAGEAL DILATION;  Surgeon: Malissa Hippo, MD;  [facility];  Service: Endoscopy;  Laterality: N/A;   ESOPHAGEAL DILATION N/A 02/16/2020   Procedure: ESOPHAGEAL DILATION;  Surgeon: Malissa Hippo, MD;  [facility];  Service: Endoscopy;  Laterality: N/A;   ESOPHAGOGASTRODUODENOSCOPY     ESOPHAGOGASTRODUODENOSCOPY N/A 07/28/2013   Procedure: ESOPHAGOGASTRODUODENOSCOPY (EGD);  Surgeon: Malissa Hippo, MD;  [facility];  Service: Endoscopy;  Laterality: N/A;  125   ESOPHAGOGASTRODUODENOSCOPY N/A 01/27/2018   Procedure: ESOPHAGOGASTRODUODENOSCOPY (EGD);  Surgeon: Malissa Hippo, MD;  [facility];  Service: Endoscopy;  Laterality: N/A;  2:00-moved to 10/30 @ 2:45pm per Ann   ESOPHAGOGASTRODUODENOSCOPY N/A 02/16/2020   Procedure: ESOPHAGOGASTRODUODENOSCOPY (EGD);  Surgeon: Malissa Hippo, MD;  [facility];  Service: Endoscopy;  Laterality: N/A;  10:00   ESOPHAGOGASTRODUODENOSCOPY (EGD) WITH PROPOFOL N/A 12/30/2022   Procedure: ESOPHAGOGASTRODUODENOSCOPY (EGD) WITH PROPOFOL;  Surgeon: Dolores Frame, MD;  [facility];  Service: Gastroenterology;  Laterality: N/A;  12:45 pm, asa 3   LEFT HEART CATH AND CORONARY ANGIOGRAPHY N/A 07/17/2017   Procedure: LEFT HEART CATH AND CORONARY ANGIOGRAPHY;  Surgeon: Tonny Bollman, MD;  [facility] INVASIVE CV LAB;  Service: Cardiovascular;  Laterality: N/A;   MALONEY DILATION N/A 07/28/2013   Procedure: Elease Hashimoto DILATION;  Surgeon: Malissa Hippo, MD;  [facility];  Service: Endoscopy;  Laterality: N/A;   SAVORY DILATION N/A 07/28/2013   Procedure: SAVORY DILATION;  Surgeon: Malissa Hippo, MD;  [facility];  Service: Endoscopy;  Laterality: N/A;   THYROIDECTOMY  11/20/2011   Procedure: THYROIDECTOMY;  Surgeon: Serena Colonel, MD;  [facility];  Service: ENT;  Laterality: Left;  LEFT THYROID  LOBECTOMY   TUBAL LIGATION     39 yrs ago.    Current Outpatient Medications  Medication Sig Dispense Refill   aspirin EC 81 MG tablet Take 81 mg by mouth once a week. Swallow whole.     escitalopram (LEXAPRO) 5 MG tablet Take 5 mg by mouth daily.     ezetimibe (ZETIA) 10 MG tablet TAKE ONE TABLET BY MOUTH ONCE DAILY. 90 tablet 1   levothyroxine (SYNTHROID) 75 MCG tablet TAKE 1 TABLET TUESDAY TO   SUNDAY AND 1/2 TABLET ON   MONDAY (Patient taking differently: TAKE 1 TABLET TUESDAY THROUGH  SUNDAY AND 1/2 TABLET ON   MONDAY) 88 tablet 1   Multiple Vitamins-Minerals (ALIVE WOMENS 50+ PO) Take 1 tablet by mouth daily.     ALPRAZolam (XANAX) 0.25 MG tablet Take 1/2-1 tablet po BID prn anxiety. CAUTION:DROWSINESS (Patient not taking: Reported on 06/22/2023) 20 tablet 0   No current facility-administered medications for this visit.    Allergies as of 06/22/2023 - Review Complete 06/22/2023  Allergen Reaction Noted   Compazine Other (See Comments) 05/08/2010   Prochlorperazine Other (See Comments) 12/23/2022   Statins Other (See Comments) 08/22/2014   Sulfa drugs cross reactors Rash 12/23/2022    Social History   Socioeconomic History    Marital status: Widowed    Spouse name: Not on file   Number of children: 2   Years of education: Not on file   Highest education level: High school graduate  Occupational History   Not on file  Tobacco Use   Smoking status: Never   Smokeless tobacco: Never  Vaping Use   Vaping status: Never Used  Substance and Sexual Activity   Alcohol use: No    Alcohol/week: 0.0 standard drinks of alcohol   Drug use: No   Sexual activity: Not Currently  Other Topics Concern   Not on file  Social History Narrative   12/11/20 lives alone long term boyfriend passed away in 05-25-2020  caffeine- one soda daily, coffee, 1/2 cup   2 children   High school   3 grandchildren   Social Drivers of Health   Financial Resource Strain: Low Risk  (01/15/2021)   Overall Financial Resource Strain (CARDIA)    Difficulty of Paying Living Expenses: Not hard at all  Food Insecurity: No Food Insecurity (01/15/2021)   Hunger Vital Sign    Worried About Running Out of Food in the Last Year: Never true    Ran Out of Food in the Last Year: Never true  Transportation Needs: No Transportation Needs (01/15/2021)   PRAPARE - Administrator, Civil Service (Medical): No    Lack of Transportation (Non-Medical): No  Physical Activity: Insufficiently Active (01/15/2021)   Exercise Vital Sign    Days of Exercise per Week: 3 days    Minutes of Exercise per Session: 20 min  Stress: No Stress Concern Present (01/15/2021)   [employer] of Occupational Health - Occupational Stress Questionnaire    Feeling of Stress : Only a little  Social Connections: Moderately Integrated (01/15/2021)   Social Connection and Isolation Panel [NHANES]    Frequency of Communication with Friends and Family: More than three times a week    Frequency of Social Gatherings with Friends and Family: More than three times a week    Attends Religious Services: 1 to 4 times per year    Active Member of [employer] or Organizations: Yes     Attends Banker Meetings:  1 to 4 times per year    Marital Status: Widowed    Review of systems General: negative for malaise, night sweats, fever, chills, weight los Neck: Negative for lumps, goiter, pain and significant neck swelling Resp: Negative for cough, wheezing, dyspnea at rest CV: Negative for chest pain, leg swelling, palpitations, orthopnea GI: denies melena, hematochezia, nausea, vomiting, diarrhea, constipation, dysphagia, odyonophagia, early satiety or unintentional weight loss.  MSK: Negative for joint pain or swelling, back pain, and muscle pain. Derm: Negative for itching or rash Psych: Denies depression, anxiety, memory loss, confusion. No homicidal or suicidal ideation.  Heme: Negative for prolonged bleeding, bruising easily, and swollen nodes. Endocrine: Negative for cold or heat intolerance, polyuria, polydipsia and goiter. Neuro: negative for tremor, gait imbalance, syncope and seizures. The remainder of the review of systems is noncontributory.  Physical Exam: BP (!) 163/71   Pulse 60   Temp 98.1 F (36.7 C) (Oral)   Ht 5\' 1"  (1.549 m)   Wt 120 lb 6.4 oz (54.6 kg)   BMI 22.75 kg/m  General:   Alert and oriented. No distress noted. Pleasant and cooperative.  Head:  Normocephalic and atraumatic. Eyes:  Conjuctiva clear without scleral icterus. Mouth:  Oral mucosa pink and moist. Good dentition. No lesions. Heart: Normal rate and rhythm, s1 and s2 heart sounds present.  Lungs: Clear lung sounds in all lobes. Respirations equal and unlabored. Abdomen:  +BS, soft, non-tender and non-distended. No rebound or guarding. No HSM or masses noted. Derm: No palmar erythema or jaundice Msk:  Symmetrical without gross deformities. Normal posture. Extremities:  Without edema. Neurologic:  Alert and  oriented x4 Psych:  Alert and cooperative. Normal mood and affect.  Invalid input(s): "6 MONTHS"   ASSESSMENT: Grace [name] is a 78 y.o. female  presenting today    PLAN:  -occult stool cards x3  -CBC  -carafate 1g QID  -will provide info on domperidone  All questions were answered, patient verbalized understanding and is in agreement with plan as outlined above.    Follow Up: 6-8 weeks   Trayton Szabo L. Jeanmarie Hubert, MSN, APRN, AGNP-C Adult-Gerontology Nurse Practitioner [facility]

## 2023-06-23 DIAGNOSIS — R110 Nausea: Secondary | ICD-10-CM | POA: Insufficient documentation

## 2023-06-23 DIAGNOSIS — K921 Melena: Secondary | ICD-10-CM | POA: Insufficient documentation

## 2023-06-23 LAB — CBC
HCT: 43.4 % (ref 35.0–45.0)
Hemoglobin: 14.7 g/dL (ref 11.7–15.5)
MCH: 30.5 pg (ref 27.0–33.0)
MCHC: 33.9 g/dL (ref 32.0–36.0)
MCV: 90 fL (ref 80.0–100.0)
MPV: 10.1 fL (ref 7.5–12.5)
Platelets: 263 10*3/uL (ref 140–400)
RBC: 4.82 10*6/uL (ref 3.80–5.10)
RDW: 12 % (ref 11.0–15.0)
WBC: 8.3 10*3/uL (ref 3.8–10.8)

## 2023-06-24 ENCOUNTER — Encounter (INDEPENDENT_AMBULATORY_CARE_PROVIDER_SITE_OTHER): Payer: Self-pay

## 2023-08-10 ENCOUNTER — Ambulatory Visit (INDEPENDENT_AMBULATORY_CARE_PROVIDER_SITE_OTHER): Admitting: Gastroenterology

## 2023-08-11 ENCOUNTER — Ambulatory Visit (INDEPENDENT_AMBULATORY_CARE_PROVIDER_SITE_OTHER): Admitting: Gastroenterology

## 2023-08-31 ENCOUNTER — Encounter (INDEPENDENT_AMBULATORY_CARE_PROVIDER_SITE_OTHER): Payer: Self-pay | Admitting: Gastroenterology

## 2023-08-31 ENCOUNTER — Ambulatory Visit (INDEPENDENT_AMBULATORY_CARE_PROVIDER_SITE_OTHER): Admitting: Gastroenterology

## 2023-08-31 VITALS — BP 136/77 | HR 80 | Temp 97.5°F | Ht 61.0 in | Wt 118.8 lb

## 2023-08-31 DIAGNOSIS — K5909 Other constipation: Secondary | ICD-10-CM | POA: Diagnosis not present

## 2023-08-31 DIAGNOSIS — K3184 Gastroparesis: Secondary | ICD-10-CM | POA: Diagnosis not present

## 2023-08-31 DIAGNOSIS — K581 Irritable bowel syndrome with constipation: Secondary | ICD-10-CM

## 2023-08-31 NOTE — Patient Instructions (Signed)
 Start Domperidone 5 mg 3 times daily - you will need to obtain this medication from outside of the [place]  (instructions provided). Take MiraLAX half a capful every day -do not miss any doses

## 2023-08-31 NOTE — Progress Notes (Signed)
 Grace [name], M.D. Gastroenterology & Hepatology [facility] Gastroenterology [address] [place], [place] [address]  Primary Care Physician: Veda Gerald, Grace [name] [address] [place] [address]  I will communicate my assessment and recommendations to the referring Grace [name] via EMR.  Problems: Gastroparesis Chronic constipation vs pelvic floor dysfunction.  History of Present Illness: Grace [name] is a 78 y.o. female with past medical history of chronic constipation, gastroparesis, heart failure with diastolic dysfunction, hyperlipidemia, hypothyroidism, stroke, mitral prolapse who presents for follow up of gastroparesis.  The patient was last seen on 06/22/2022. At that time, the patient was complaining of some melena and had stool cards x 3 ordered but result was not received. CBC was checked with normal hemoglobin of 14.7.  Was started on Carafate  1 g every 6 hours.  Reports she has not had too much appetite and does not have the desire to eat. Feels full easily. However, she has not lost any weight as she forces herself to eat. She also feels discomfort in her upper abdomen. Also has frequent nausea without vomiting.  She takes Miralax 1/2 capful daily. She forgets to take it every day. Does not take a higher dose as it leads to diarrhea for several days.  The patient denies having any nausea, vomiting, fever, chills, hematochezia, melena, hematemesis, diarrhea, jaundice, pruritus or weight loss.  Last TSH was 3.72 (10/2022).   Gastric emptying study: 11/2019  12% emptied at 1 hr ( normal >= 10%) 17% emptied at 2 hr ( normal >= 40%) 21% emptied at 3 hr ( normal >= 70%) 25% emptied at 4 hr ( normal >= 90%)   IMPRESSION: Significantly delayed gastric emptying study.  Last EGD:-12/30/2022 Normal esophagus, stomach and duodenum.  Gastric biopsies showed inactive gastritis negative for H. pylori.  Last Colonoscopy: 03/2021, performed by Dr.  Homero Luster Normal colon, external hemorrhoids.  Past Medical History: Past Medical History:  Diagnosis Date   Dry eyes, bilateral    Dyspnea    Frequency of urination    Heart murmur    Hyperlipidemia    Hypothyroid    Migraine    Mitral valve prolapse    Skin cancer    Stroke [facility])    TIA (transient ischemic attack) 02/19/2012    Past Surgical History: Past Surgical History:  Procedure Laterality Date   BALLOON DILATION N/A 07/28/2013   Procedure: BALLOON DILATION;  Surgeon: Ruby Corporal, Grace [name];  [facility];  Service: Endoscopy;  Laterality: N/A;   BIOPSY  02/16/2020   Procedure: BIOPSY;  Surgeon: Ruby Corporal, Grace [name];  [facility];  Service: Endoscopy;;   BIOPSY  12/30/2022   Procedure: BIOPSY;  Surgeon: Urban Garden, Grace [name];  [facility];  Service: Gastroenterology;;   BREAST BIOPSY Left 04/09/2022   BENIGN BREAST PARENCHYMA WITH FIBROADENOMATOID CHANGES AND CALCIFICATION   BREAST EXCISIONAL BIOPSY Left    benign-pt unsure when   CATARACT EXTRACTION W/PHACO Right 07/11/2013   Procedure: CATARACT EXTRACTION PHACO AND INTRAOCULAR LENS PLACEMENT (IOC);  Surgeon: Anner Kill, Grace [name];  Location: [facility];  Service: Ophthalmology;  Laterality: Right;  CDE 9.99   CATARACT EXTRACTION W/PHACO Left 08/04/2013   Procedure: CATARACT EXTRACTION PHACO AND INTRAOCULAR LENS PLACEMENT (IOC);  Surgeon: Anner Kill, Grace [name];  Location: [facility];  Service: Ophthalmology;  Laterality: Left;  CDE:16.26   CHOLECYSTECTOMY     COLONOSCOPY  12/18/2010   Procedure: COLONOSCOPY;  Surgeon: Ruby Corporal, Grace [name];  [facility];  Service: Endoscopy;  Laterality: N/A;  1:00 pm   COLONOSCOPY N/A 03/06/2016   Procedure: COLONOSCOPY;  Surgeon: Ruby Corporal, Grace [name];  [facility];  Service: Endoscopy;  Laterality: N/A;  1200   COLONOSCOPY N/A 04/18/2021   Procedure: COLONOSCOPY;  Surgeon: Ruby Corporal, Grace [name];  [facility];  Service: Endoscopy;   Laterality: N/A;  805   ESOPHAGEAL DILATION N/A 01/27/2018   Procedure: ESOPHAGEAL DILATION;  Surgeon: Ruby Corporal, Grace [name];  [facility];  Service: Endoscopy;  Laterality: N/A;   ESOPHAGEAL DILATION N/A 02/16/2020   Procedure: ESOPHAGEAL DILATION;  Surgeon: Ruby Corporal, Grace [name];  [facility];  Service: Endoscopy;  Laterality: N/A;   ESOPHAGOGASTRODUODENOSCOPY     ESOPHAGOGASTRODUODENOSCOPY N/A 07/28/2013   Procedure: ESOPHAGOGASTRODUODENOSCOPY (EGD);  Surgeon: Ruby Corporal, Grace [name];  [facility];  Service: Endoscopy;  Laterality: N/A;  125   ESOPHAGOGASTRODUODENOSCOPY N/A 01/27/2018   Procedure: ESOPHAGOGASTRODUODENOSCOPY (EGD);  Surgeon: Ruby Corporal, Grace [name];  [facility];  Service: Endoscopy;  Laterality: N/A;  2:00-moved to 10/30 @ 2:45pm per Ann   ESOPHAGOGASTRODUODENOSCOPY N/A 02/16/2020   Procedure: ESOPHAGOGASTRODUODENOSCOPY (EGD);  Surgeon: Ruby Corporal, Grace [name];  [facility];  Service: Endoscopy;  Laterality: N/A;  10:00   ESOPHAGOGASTRODUODENOSCOPY (EGD) WITH PROPOFOL  N/A 12/30/2022   Procedure: ESOPHAGOGASTRODUODENOSCOPY (EGD) WITH PROPOFOL ;  Surgeon: Urban Garden, Grace [name];  [facility];  Service: Gastroenterology;  Laterality: N/A;  12:45 pm, asa 3   LEFT HEART CATH AND CORONARY ANGIOGRAPHY N/A 07/17/2017   Procedure: LEFT HEART CATH AND CORONARY ANGIOGRAPHY;  Surgeon: Arnoldo Lapping, Grace [name];  Location: [facility] INVASIVE CV LAB;  Service: Cardiovascular;  Laterality: N/A;   MALONEY DILATION N/A 07/28/2013   Procedure: Londa Rival DILATION;  Surgeon: Ruby Corporal, Grace [name];  [facility];  Service: Endoscopy;  Laterality: N/A;   SAVORY DILATION N/A 07/28/2013   Procedure: SAVORY DILATION;  Surgeon: Ruby Corporal, Grace [name];  [facility];  Service: Endoscopy;  Laterality: N/A;   THYROIDECTOMY  11/20/2011   Procedure: THYROIDECTOMY;  Surgeon: Janita Mellow, Grace [name];  [facility] OR;  Service: ENT;  Laterality:  Left;  LEFT THYROID  LOBECTOMY   TUBAL LIGATION     39 yrs ago.    Family History: Family History  Problem Relation Age of Onset   Heart disease Mother    Hypertension Mother    Stroke Mother    Thyroid  disease Mother    Dementia Mother    Stroke Maternal Grandmother    Cancer Paternal Grandmother        breast   Colon cancer Neg Hx     Social History: Social History   Tobacco Use  Smoking Status Never  Smokeless Tobacco Never   Social History   Substance and Sexual Activity  Alcohol Use No   Alcohol/week: 0.0 standard drinks of alcohol   Social History   Substance and Sexual Activity  Drug Use No    Allergies: Allergies  Allergen Reactions   Compazine Other (See Comments)    Slurred speech   Prochlorperazine Other (See Comments)    Unknown reaction   Statins Other (See Comments)    myalgias   Sulfa Drugs Cross Reactors Rash    Medications: Current Outpatient Medications  Medication Sig Dispense Refill   ALPRAZolam  (XANAX ) 0.25 MG tablet Take 1/2-1 tablet po BID prn anxiety. CAUTION:DROWSINESS 20 tablet 0   ezetimibe  (ZETIA ) 10 MG tablet TAKE ONE TABLET BY  MOUTH ONCE DAILY. 90 tablet 1   levothyroxine  (SYNTHROID ) 75 MCG tablet TAKE 1 TABLET TUESDAY TO   SUNDAY AND 1/2 TABLET ON   MONDAY (Patient taking differently: TAKE 1 TABLET TUESDAY THROUGH  SUNDAY AND 1/2 TABLET ON   MONDAY) 88 tablet 1   Multiple Vitamins-Minerals (ALIVE WOMENS 50+ PO) Take 1 tablet by mouth daily.     aspirin  EC 81 MG tablet Take 81 mg by mouth once a week. Swallow whole. (Patient not taking: Reported on 08/31/2023)     No current facility-administered medications for this visit.    Review of Systems: GENERAL: negative for malaise, night sweats HEENT: No changes in hearing or vision, no nose bleeds or other nasal problems. NECK: Negative for lumps, goiter, pain and significant neck swelling RESPIRATORY: Negative for cough, wheezing CARDIOVASCULAR: Negative for chest pain, leg  swelling, palpitations, orthopnea GI: SEE HPI MUSCULOSKELETAL: Negative for joint pain or swelling, back pain, and muscle pain. SKIN: Negative for lesions, rash PSYCH: Negative for sleep disturbance, mood disorder and recent psychosocial stressors. HEMATOLOGY Negative for prolonged bleeding, bruising easily, and swollen nodes. ENDOCRINE: Negative for cold or heat intolerance, polyuria, polydipsia and goiter. NEURO: negative for tremor, gait imbalance, syncope and seizures. The remainder of the review of systems is noncontributory.   Physical Exam: BP 136/77 (BP Location: Right Arm, Patient Position: Sitting, Cuff Size: Normal)   Pulse 80   Temp (!) 97.5 F (36.4 C) (Temporal)   Ht 5\' 1"  (1.549 m)   Wt 118 lb 12.8 oz (53.9 kg)   BMI 22.45 kg/m  GENERAL: The patient is AO x3, in no acute distress. HEENT: Head is normocephalic and atraumatic. EOMI are intact. Mouth is well hydrated and without lesions. NECK: Supple. No masses LUNGS: Clear to auscultation. No presence of rhonchi/wheezing/rales. Adequate chest expansion HEART: RRR, normal s1 and s2. ABDOMEN: Tender upon palpation of the upper abdomen, no guarding, no peritoneal signs, and nondistended. BS +. No masses. EXTREMITIES: Without any cyanosis, clubbing, rash, lesions or edema. NEUROLOGIC: AOx3, no focal motor deficit. SKIN: no jaundice, no rashes  Imaging/Labs: as above  I personally reviewed and interpreted the available labs, imaging and endoscopic files.  Impression and Plan: Grace [name] is a 78 y.o. female with past medical history of chronic constipation, gastroparesis, heart failure with diastolic dysfunction, hyperlipidemia, hypothyroidism, stroke, mitral prolapse who presents for follow up of gastroparesis.  Patient has presented issues with symptoms of gastroparesis for multiple years.  He did not have significant improvement with the use of Reglan  and has been advised multiple times to attempt using domperidone  but she has been hesitant to try this.  We discussed in detail the benefits from taking this medications.  The patient is agreeable today to start low-dose domperidone 3 times a day.  We discussed that this may improve her bowel frequency as well but she can continue taking half a capful of MiraLAX daily to keep her bowels regular.  -Start Domperidone 5 mg 3 times daily -Take MiraLAX half a capful every day   All questions were answered.      Grace Cress, Grace [name] Gastroenterology and Hepatology [facility] [place] [facility]

## 2023-09-20 ENCOUNTER — Encounter (HOSPITAL_COMMUNITY): Payer: Self-pay

## 2023-09-20 ENCOUNTER — Emergency Department (HOSPITAL_COMMUNITY)

## 2023-09-20 ENCOUNTER — Observation Stay (HOSPITAL_COMMUNITY)
Admission: EM | Admit: 2023-09-20 | Discharge: 2023-09-21 | Disposition: A | Discharge status: Home / Self Care | Attending: Emergency Medicine | Admitting: Emergency Medicine

## 2023-09-20 ENCOUNTER — Other Ambulatory Visit: Payer: Self-pay

## 2023-09-20 DIAGNOSIS — G459 Transient cerebral ischemic attack, unspecified: Principal | ICD-10-CM

## 2023-09-20 DIAGNOSIS — F419 Anxiety disorder, unspecified: Secondary | ICD-10-CM | POA: Diagnosis present

## 2023-09-20 DIAGNOSIS — F39 Unspecified mood [affective] disorder: Secondary | ICD-10-CM | POA: Insufficient documentation

## 2023-09-20 DIAGNOSIS — K3184 Gastroparesis: Secondary | ICD-10-CM | POA: Diagnosis not present

## 2023-09-20 DIAGNOSIS — Z7902 Long term (current) use of antithrombotics/antiplatelets: Secondary | ICD-10-CM | POA: Insufficient documentation

## 2023-09-20 DIAGNOSIS — I5032 Chronic diastolic (congestive) heart failure: Secondary | ICD-10-CM | POA: Diagnosis not present

## 2023-09-20 DIAGNOSIS — Z79899 Other long term (current) drug therapy: Secondary | ICD-10-CM | POA: Diagnosis not present

## 2023-09-20 DIAGNOSIS — I110 Hypertensive heart disease with heart failure: Secondary | ICD-10-CM | POA: Diagnosis not present

## 2023-09-20 DIAGNOSIS — E785 Hyperlipidemia, unspecified: Secondary | ICD-10-CM | POA: Insufficient documentation

## 2023-09-20 DIAGNOSIS — E039 Hypothyroidism, unspecified: Secondary | ICD-10-CM | POA: Diagnosis not present

## 2023-09-20 DIAGNOSIS — R630 Anorexia: Secondary | ICD-10-CM | POA: Diagnosis not present

## 2023-09-20 DIAGNOSIS — Z85828 Personal history of other malignant neoplasm of skin: Secondary | ICD-10-CM | POA: Insufficient documentation

## 2023-09-20 DIAGNOSIS — R29818 Other symptoms and signs involving the nervous system: Secondary | ICD-10-CM | POA: Diagnosis not present

## 2023-09-20 DIAGNOSIS — H53142 Visual discomfort, left eye: Secondary | ICD-10-CM | POA: Diagnosis present

## 2023-09-20 DIAGNOSIS — R2990 Unspecified symptoms and signs involving the nervous system: Secondary | ICD-10-CM | POA: Diagnosis present

## 2023-09-20 DIAGNOSIS — G43909 Migraine, unspecified, not intractable, without status migrainosus: Secondary | ICD-10-CM | POA: Diagnosis not present

## 2023-09-20 DIAGNOSIS — F32A Depression, unspecified: Secondary | ICD-10-CM | POA: Insufficient documentation

## 2023-09-20 DIAGNOSIS — R471 Dysarthria and anarthria: Secondary | ICD-10-CM | POA: Insufficient documentation

## 2023-09-20 DIAGNOSIS — K219 Gastro-esophageal reflux disease without esophagitis: Secondary | ICD-10-CM | POA: Insufficient documentation

## 2023-09-20 DIAGNOSIS — Z8673 Personal history of transient ischemic attack (TIA), and cerebral infarction without residual deficits: Secondary | ICD-10-CM | POA: Diagnosis not present

## 2023-09-20 DIAGNOSIS — R413 Other amnesia: Secondary | ICD-10-CM | POA: Diagnosis present

## 2023-09-20 DIAGNOSIS — Z7982 Long term (current) use of aspirin: Secondary | ICD-10-CM | POA: Insufficient documentation

## 2023-09-20 LAB — RAPID URINE DRUG SCREEN, HOSP PERFORMED
Amphetamines: NOT DETECTED
Barbiturates: NOT DETECTED
Benzodiazepines: NOT DETECTED
Cocaine: NOT DETECTED
Opiates: NOT DETECTED
Tetrahydrocannabinol: NOT DETECTED

## 2023-09-20 LAB — DIFFERENTIAL
Abs Immature Granulocytes: 0.03 10*3/uL (ref 0.00–0.07)
Basophils Absolute: 0.1 10*3/uL (ref 0.0–0.1)
Basophils Relative: 1 %
Eosinophils Absolute: 0.1 10*3/uL (ref 0.0–0.5)
Eosinophils Relative: 2 %
Immature Granulocytes: 0 %
Lymphocytes Relative: 31 %
Lymphs Abs: 2.3 10*3/uL (ref 0.7–4.0)
Monocytes Absolute: 0.8 10*3/uL (ref 0.1–1.0)
Monocytes Relative: 11 %
Neutro Abs: 4.1 10*3/uL (ref 1.7–7.7)
Neutrophils Relative %: 55 %

## 2023-09-20 LAB — COMPREHENSIVE METABOLIC PANEL WITH GFR
ALT: 17 U/L (ref 0–44)
AST: 26 U/L (ref 15–41)
Albumin: 4 g/dL (ref 3.5–5.0)
Alkaline Phosphatase: 66 U/L (ref 38–126)
Anion gap: 10 (ref 5–15)
BUN: 8 mg/dL (ref 8–23)
CO2: 27 mmol/L (ref 22–32)
Calcium: 9 mg/dL (ref 8.9–10.3)
Chloride: 97 mmol/L — ABNORMAL LOW (ref 98–111)
Creatinine, Ser: 0.69 mg/dL (ref 0.44–1.00)
GFR, Estimated: >60 mL/min (ref >60)
Glucose, Bld: 96 mg/dL (ref 70–99)
Potassium: 3.8 mmol/L (ref 3.5–5.1)
Sodium: 134 mmol/L — ABNORMAL LOW (ref 135–145)
Total Bilirubin: 0.8 mg/dL (ref 0.0–1.2)
Total Protein: 7 g/dL (ref 6.5–8.1)

## 2023-09-20 LAB — CBC
HCT: 40.3 % (ref 36.0–46.0)
Hemoglobin: 14 g/dL (ref 12.0–15.0)
MCH: 31.2 pg (ref 26.0–34.0)
MCHC: 34.7 g/dL (ref 30.0–36.0)
MCV: 89.8 fL (ref 80.0–100.0)
Platelets: 221 10*3/uL (ref 150–400)
RBC: 4.49 MIL/uL (ref 3.87–5.11)
RDW: 12.4 % (ref 11.5–15.5)
WBC: 7.4 10*3/uL (ref 4.0–10.5)
nRBC: 0 % (ref 0.0–0.2)

## 2023-09-20 LAB — CBG MONITORING, ED: Glucose-Capillary: 84 mg/dL (ref 70–99)

## 2023-09-20 LAB — PROTIME-INR
INR: 1 (ref 0.8–1.2)
Prothrombin Time: 13.3 s (ref 11.4–15.2)

## 2023-09-20 LAB — ETHANOL: Alcohol, Ethyl (B): <15 mg/dL (ref <15)

## 2023-09-20 LAB — APTT: aPTT: 25 s (ref 24–36)

## 2023-09-20 MED ORDER — ASPIRIN 81 MG PO TBEC: 81.0000 mg | DELAYED_RELEASE_TABLET | ORAL | Status: DC

## 2023-09-20 MED ORDER — EZETIMIBE 10 MG PO TABS
10.0000 mg | ORAL_TABLET | Freq: Every day | ORAL | Status: DC
  Administered 2023-09-21: 10 mg via ORAL
  Filled 2023-09-20: qty 1

## 2023-09-20 MED ORDER — CLOPIDOGREL BISULFATE 75 MG PO TABS
300.0000 mg | ORAL_TABLET | Freq: Once | ORAL | Status: AC
  Administered 2023-09-20: 300 mg via ORAL
  Filled 2023-09-20: qty 4

## 2023-09-20 MED ORDER — ALBUTEROL SULFATE (2.5 MG/3ML) 0.083% IN NEBU: 2.5000 mg | INHALATION_SOLUTION | RESPIRATORY_TRACT | Status: DC | PRN

## 2023-09-20 MED ORDER — IOHEXOL 350 MG/ML SOLN
75.0000 mL | Freq: Once | INTRAVENOUS | Status: AC | PRN
  Administered 2023-09-20: 75 mL via INTRAVENOUS

## 2023-09-20 MED ORDER — ALPRAZOLAM 0.25 MG PO TABS: 0.2500 mg | ORAL_TABLET | Freq: Two times a day (BID) | ORAL | Status: DC | PRN

## 2023-09-20 MED ORDER — LACTATED RINGERS IV BOLUS
500.0000 mL | Freq: Once | INTRAVENOUS | Status: AC
  Administered 2023-09-21: 500 mL via INTRAVENOUS

## 2023-09-20 MED ORDER — ENOXAPARIN SODIUM 40 MG/0.4ML IJ SOSY
40.0000 mg | PREFILLED_SYRINGE | INTRAMUSCULAR | Status: DC
  Administered 2023-09-21: 40 mg via SUBCUTANEOUS
  Filled 2023-09-20: qty 0.4

## 2023-09-20 MED ORDER — ONDANSETRON HCL 4 MG/2ML IJ SOLN: 4.0000 mg | Freq: Four times a day (QID) | INTRAMUSCULAR | Status: DC | PRN

## 2023-09-20 MED ORDER — LEVOTHYROXINE SODIUM 88 MCG PO TABS: 88.0000 ug | ORAL_TABLET | Freq: Every day | ORAL | Status: DC

## 2023-09-20 MED ORDER — SODIUM CHLORIDE 0.9% FLUSH
3.0000 mL | Freq: Two times a day (BID) | INTRAVENOUS | Status: DC
  Administered 2023-09-21: 3 mL via INTRAVENOUS

## 2023-09-20 MED ORDER — POLYETHYLENE GLYCOL 3350 17 G PO PACK: 17.0000 g | PACK | Freq: Every day | ORAL | Status: DC | PRN

## 2023-09-20 MED ORDER — LEVOTHYROXINE SODIUM 75 MCG PO TABS: 37.5000 ug | ORAL_TABLET | Freq: Once | ORAL | Status: DC

## 2023-09-20 MED ORDER — ACETAMINOPHEN 500 MG PO TABS: 1000.0000 mg | ORAL_TABLET | Freq: Four times a day (QID) | ORAL | Status: DC | PRN

## 2023-09-20 MED ORDER — MELATONIN 3 MG PO TABS: 6.0000 mg | ORAL_TABLET | Freq: Every evening | ORAL | Status: DC | PRN

## 2023-09-20 NOTE — H&P (Incomplete)
 History and Physical    Grace [name] FMW:[id number] DOB: 10-Jun-1945 DOA: 09/20/2023  PCP: Orpha Yancey LABOR, MD   Patient coming from: Home   Chief Complaint: Aphasia, vision changes    HPI:  Grace [name] is a 78 y.o. female with hx of TIA, migraines, HFpEF, HTN, HLD, hypothyroidism, gastroparesis, who presents with episode of expressive aphasia, dysarthria and visual change accompanied by R temporal headache.   On chart review has had similar episodes dating back to at least 2013 which included episodes of blurred vision, balance change, and slurred speech. Ultimately was felt to have migraine with aura. While she carries a diagnosis of stroke, I do not see any imaging confirming stroke in her records.   On this admission reports similar symptoms. LKW ~ 4 PM. At that time was speaking to her friend over the phone and noted that she was having issues with word finding as well as slurred speech.  Her friend over the phone noted that speech was abnormal as well.  She was also having blurred vision thinks it is in both eyes but worse in the left.  Denies any homonymous visual field deficit.  Also notes that she is been having right temporal headaches which have been intermittent but recurrent over the last few days.  She is not sure how long her symptoms lasted now she feels mostly back to her normal but still feels like something is off that she cannot put her finger on.  Reports difficulties with her memory and increased latency/effort with recall. Otherwise reports decreased oral intake without other GI symptoms.  She is on mirtazapine at night.    Review of Systems:  ROS complete and negative except as marked above   Allergies  Allergen Reactions  . Compazine Other (See Comments)    Slurred speech  . Prochlorperazine Other (See Comments)    Unknown reaction  . Statins Other (See Comments)    myalgias  . Sulfa Drugs Cross Reactors Rash    Prior to Admission medications    Medication Sig Start Date End Date Taking? Authorizing Provider  ALPRAZolam  (XANAX ) 0.25 MG tablet Take 1/2-1 tablet po BID prn anxiety. CAUTION:DROWSINESS 07/15/21   Cook, Jayce G, DO  aspirin  EC 81 MG tablet Take 81 mg by mouth once a week. Swallow whole. Patient not taking: Reported on 08/31/2023    [provider]  ezetimibe  (ZETIA ) 10 MG tablet TAKE ONE TABLET BY MOUTH ONCE DAILY. 07/01/21   Alphonsa Glendia LABOR, MD  levothyroxine  (SYNTHROID ) 75 MCG tablet TAKE 1 TABLET TUESDAY TO   SUNDAY AND 1/2 TABLET ON   MONDAY Patient taking differently: TAKE 1 TABLET TUESDAY THROUGH  SUNDAY AND 1/2 TABLET ON   MONDAY 08/06/21   Alphonsa Glendia LABOR, MD  Multiple Vitamins-Minerals (ALIVE WOMENS 50+ PO) Take 1 tablet by mouth daily.    [provider]    Past Medical History:  Diagnosis Date  . Dry eyes, bilateral   . Dyspnea   . Frequency of urination   . Heart murmur   . Hyperlipidemia   . Hypothyroid   . Migraine   . Mitral valve prolapse   . Skin cancer   . Stroke (HCC)   . TIA (transient ischemic attack) 02/19/2012    Past Surgical History:  Procedure Laterality Date  . BALLOON DILATION N/A 07/28/2013   Procedure: BALLOON DILATION;  Surgeon: Claudis RAYMOND Rivet, MD;  [facility];  Service: Endoscopy;  Laterality: N/A;  . BIOPSY  02/16/2020   Procedure: BIOPSY;  Surgeon: Golda Claudis PENNER, MD;  [facility]: [facility];  Service: Endoscopy;;  . BIOPSY  12/30/2022   Procedure: BIOPSY;  Surgeon: Eartha Angelia Sieving, MD;  [facility];  Service: Gastroenterology;;  . BREAST BIOPSY Left 04/09/2022   BENIGN BREAST PARENCHYMA WITH FIBROADENOMATOID CHANGES AND CALCIFICATION  . BREAST EXCISIONAL BIOPSY Left    benign-pt unsure when  . CATARACT EXTRACTION W/PHACO Right 07/11/2013   Procedure: CATARACT EXTRACTION PHACO AND INTRAOCULAR LENS PLACEMENT (IOC);  Surgeon: Cherene Mania, MD;  Location: [facility];  Service: Ophthalmology;  Laterality: Right;  CDE 9.99  .  CATARACT EXTRACTION W/PHACO Left 08/04/2013   Procedure: CATARACT EXTRACTION PHACO AND INTRAOCULAR LENS PLACEMENT (IOC);  Surgeon: Cherene Mania, MD;  Location: [facility];  Service: Ophthalmology;  Laterality: Left;  CDE:16.26  . CHOLECYSTECTOMY    . COLONOSCOPY  12/18/2010   Procedure: COLONOSCOPY;  Surgeon: Claudis PENNER Golda, MD;  [facility];  Service: Endoscopy;  Laterality: N/A;  1:00 pm  . COLONOSCOPY N/A 03/06/2016   Procedure: COLONOSCOPY;  Surgeon: Claudis PENNER Golda, MD;  [facility];  Service: Endoscopy;  Laterality: N/A;  1200  . COLONOSCOPY N/A 04/18/2021   Procedure: COLONOSCOPY;  Surgeon: Golda Claudis PENNER, MD;  [facility];  Service: Endoscopy;  Laterality: N/A;  805  . ESOPHAGEAL DILATION N/A 01/27/2018   Procedure: ESOPHAGEAL DILATION;  Surgeon: Golda Claudis PENNER, MD;  [facility];  Service: Endoscopy;  Laterality: N/A;  . ESOPHAGEAL DILATION N/A 02/16/2020   Procedure: ESOPHAGEAL DILATION;  Surgeon: Golda Claudis PENNER, MD;  [facility];  Service: Endoscopy;  Laterality: N/A;  . ESOPHAGOGASTRODUODENOSCOPY    . ESOPHAGOGASTRODUODENOSCOPY N/A 07/28/2013   Procedure: ESOPHAGOGASTRODUODENOSCOPY (EGD);  Surgeon: Claudis PENNER Golda, MD;  [facility];  Service: Endoscopy;  Laterality: N/A;  125  . ESOPHAGOGASTRODUODENOSCOPY N/A 01/27/2018   Procedure: ESOPHAGOGASTRODUODENOSCOPY (EGD);  Surgeon: Golda Claudis PENNER, MD;  [facility];  Service: Endoscopy;  Laterality: N/A;  2:00-moved to 10/30 @ 2:45pm per Jenkins  . ESOPHAGOGASTRODUODENOSCOPY N/A 02/16/2020   Procedure: ESOPHAGOGASTRODUODENOSCOPY (EGD);  Surgeon: Golda Claudis PENNER, MD;  [facility];  Service: Endoscopy;  Laterality: N/A;  10:00  . ESOPHAGOGASTRODUODENOSCOPY (EGD) WITH PROPOFOL  N/A 12/30/2022   Procedure: ESOPHAGOGASTRODUODENOSCOPY (EGD) WITH PROPOFOL ;  Surgeon: Eartha Angelia Sieving, MD;  [facility];  Service: Gastroenterology;   Laterality: N/A;  12:45 pm, asa 3  . LEFT HEART CATH AND CORONARY ANGIOGRAPHY N/A 07/17/2017   Procedure: LEFT HEART CATH AND CORONARY ANGIOGRAPHY;  Surgeon: Wonda Sharper, MD;  [facility] INVASIVE CV LAB;  Service: Cardiovascular;  Laterality: N/A;  . MALONEY DILATION N/A 07/28/2013   Procedure: AGAPITO DILATION;  Surgeon: Claudis PENNER Golda, MD;  [facility];  Service: Endoscopy;  Laterality: N/A;  . SAVORY DILATION N/A 07/28/2013   Procedure: SAVORY DILATION;  Surgeon: Claudis PENNER Golda, MD;  [facility];  Service: Endoscopy;  Laterality: N/A;  . THYROIDECTOMY  11/20/2011   Procedure: THYROIDECTOMY;  Surgeon: Ida Loader, MD;  Location: [facility] OR;  Service: ENT;  Laterality: Left;  LEFT THYROID  LOBECTOMY  . TUBAL LIGATION     38 yrs ago.     reports that she has never smoked. She has never used smokeless tobacco. She reports that she does not drink alcohol and does not use drugs.  Family History  Problem Relation Age of Onset  . Heart disease Mother   .  Hypertension Mother   . Stroke Mother   . Thyroid  disease Mother   . Dementia Mother   . Stroke Maternal Grandmother   . Cancer Paternal Grandmother        breast  . Colon cancer Neg Hx      Physical Exam: Vitals:   09/20/23 2019 09/20/23 2020 09/20/23 2100  BP:  (!) 154/83 (!) 146/76  Pulse:  63   Resp:  18 19  Temp:  97.6 F (36.4 C)   TempSrc:  Oral   SpO2:  98% 99%  Weight: 53.3 kg      Gen: Awake, alert, NAD   CV: Regular, normal S1, S2, 1/6 SEM Resp: Normal WOB, coarse but CTAB  Abd: Flat, normoactive, nontender MSK: Symmetric, no edema  Skin: No rashes or lesions to exposed skin  Neuro: Alert and interactive, fully oriented, speech is clear without aphasia, CN II through XII intact, motor is 4/5 throughout the left upper and lower extremity, 5 out of 5 throughout the right.  Sensation intact and able to fine touch Psych: euthymic, appropriate    Data review:   Labs reviewed, notable  for:   Labs unremarkable  Micro:  Results for orders placed or performed in visit on 08/27/21  Novel Coronavirus, NAA ([facility])     Status: None   Collection Time: 08/27/21 12:00 AM   Specimen: Nasopharyngeal(NP) swabs in vial transport medium   Nasopharynge  Previous  Result Value Ref Range Status   SARS-CoV-2, NAA Not Detected Not Detected Final    Comment: This nucleic acid amplification test was developed and its performance characteristics determined by [employer]. Nucleic acid amplification tests include RT-PCR and TMA. This test has not been FDA cleared or approved. This test has been authorized by FDA under an Emergency Use Authorization (EUA). This test is only authorized for the duration of time the declaration that circumstances exist justifying the authorization of the emergency use of in vitro diagnostic tests for detection of SARS-CoV-2 virus and/or diagnosis of COVID-19 infection under section 564(b)(1) of the Act, 21 U.S.C. 639aaa-6(a) (1), unless the authorization is terminated or revoked sooner. When diagnostic testing is negative, the possibility of a false negative result should be considered in the context of a patient's recent exposures and the presence of clinical signs and symptoms consistent with COVID-19. An individual without symptoms of COVID-19 and who is not shedding SARS-CoV-2 virus wo uld expect to have a negative (not detected) result in this assay.     Imaging reviewed:  CT ANGIO HEAD NECK W WO CM Result Date: 09/20/2023 CLINICAL DATA:  Acute neurologic deficit EXAM: CT ANGIOGRAPHY HEAD AND NECK WITH AND WITHOUT CONTRAST TECHNIQUE: Multidetector CT imaging of the head and neck was performed using the standard protocol during bolus administration of intravenous contrast. Multiplanar CT image reconstructions and MIPs were obtained to evaluate the vascular anatomy. Carotid stenosis measurements (when applicable) are obtained utilizing NASCET  criteria, using the distal internal carotid diameter as the denominator. RADIATION DOSE REDUCTION: This exam was performed according to the departmental dose-optimization program which includes automated exposure control, adjustment of the mA and/or kV according to patient size and/or use of iterative reconstruction technique. CONTRAST:  75mL OMNIPAQUE  IOHEXOL  350 MG/ML SOLN COMPARISON:  None Available. FINDINGS: CTA NECK FINDINGS Skeleton: No acute abnormality or high grade bony spinal canal stenosis. Other neck: Normal pharynx, larynx and major salivary glands. No cervical lymphadenopathy. Unremarkable thyroid  gland. Upper chest: No pneumothorax or pleural effusion. No nodules or masses.  Aortic arch: There is no calcific atherosclerosis of the aortic arch. Conventional 3 vessel aortic branching pattern. RIGHT carotid system: Normal without aneurysm, dissection or stenosis. LEFT carotid system: Normal without aneurysm, dissection or stenosis. Vertebral arteries: Codominant configuration. There is no dissection, occlusion or flow-limiting stenosis to the skull base (V1-V3 segments). CTA HEAD FINDINGS POSTERIOR CIRCULATION: Vertebral arteries are normal. No proximal occlusion of the anterior or inferior cerebellar arteries. Basilar artery is normal. Superior cerebellar arteries are normal. Posterior cerebral arteries are normal. ANTERIOR CIRCULATION: Intracranial internal carotid arteries are normal. Anterior cerebral arteries are normal. Middle cerebral arteries are normal. Venous sinuses: As permitted by contrast timing, patent. Anatomic variants: None Review of the MIP images confirms the above findings. IMPRESSION: Normal CTA of the head and neck. Electronically Signed   By: Franky Stanford M.D.   On: 09/20/2023 21:58   CT HEAD CODE STROKE WO CONTRAST Result Date: 09/20/2023 CLINICAL DATA:  Code stroke.  Altered mental status EXAM: CT HEAD WITHOUT CONTRAST TECHNIQUE: Contiguous axial images were obtained from  the base of the skull through the vertex without intravenous contrast. RADIATION DOSE REDUCTION: This exam was performed according to the departmental dose-optimization program which includes automated exposure control, adjustment of the mA and/or kV according to patient size and/or use of iterative reconstruction technique. COMPARISON:  None Available. FINDINGS: Brain: There is no mass, hemorrhage or extra-axial collection. The size and configuration of the ventricles and extra-axial CSF spaces are normal. The brain parenchyma is normal, without evidence of acute or chronic infarction. Vascular: No abnormal hyperdensity of the major intracranial arteries or dural venous sinuses. No intracranial atherosclerosis. Skull: The visualized skull base, calvarium and extracranial soft tissues are normal. Sinuses/Orbits: No fluid levels or advanced mucosal thickening of the visualized paranasal sinuses. No mastoid or middle ear effusion. The orbits are normal. ASPECTS [facility] Stroke Program Early CT Score) - Ganglionic level infarction (caudate, lentiform nuclei, internal capsule, insula, M1-M3 cortex): 7 - Supraganglionic infarction (M4-M6 cortex): 3 Total score (0-10 with 10 being normal): 10 IMPRESSION: 1. No acute intracranial abnormality. 2. ASPECTS is 10. These results were called by telephone at the time of interpretation on 09/20/2023 at 8:48 pm to provider JULIE HAVILAND , who verbally acknowledged these results. Electronically Signed   By: Franky Stanford M.D.   On: 09/20/2023 20:48     ED Course:  Arrived as code stroke   Assessment/Plan:  78 y.o. female with hx ***   Body mass index is 22.22 kg/m. ***   DVT prophylaxis:  {Blank single:19197::Lovenox ,SQ Heparin ,IV heparin  gtts,Xarelto,Eliquis,Coumadin,SCDs,***} Code Status:  {Blank single:19197::Full Code,DNR with Intubation,DNR/DNI(Do NOT Intubate),Comfort Care,***} Diet:  Diet Orders (From admission, onward)     Start      Ordered   09/20/23 2245  Diet Heart Room service appropriate? Yes; Fluid consistency: Thin  Diet effective now       Comments: Swallow screen then ok for [facility] diet if passed  Question Answer Comment  Room service appropriate? Yes   Fluid consistency: Thin      09/20/23 2247           Family Communication:  ***  Consults:  ***  Admission status:   {Blank single:19197::Observation,Inpatient}, {Blank single:19197::Med-Surg,Telemetry bed,Step Down Unit}  Severity of Illness: {Observation/Inpatient:21159}   Dorn Dawson, MD [facility]  How to contact the [facility] Attending or Consulting provider 7A - 7P or covering provider during after hours 7P -7A, for this patient.  Check the care team in [facility] and look for a) attending/consulting  [facility] provider listed and b) the [facility] team listed Log into [email or web address] and use [place]'s universal password to access. If you do not have the password, please contact the hospital operator. Locate the [facility] provider you are looking for under [facility] and page to a number that you can be directly reached. If you still have difficulty reaching the provider, please page the [facility] (Director on Call) for the Hospitalists listed on amion for assistance.  09/20/2023, 10:50 PM

## 2023-09-20 NOTE — ED Triage Notes (Signed)
 Pt presents with AMS. Hx of previous stroke. LWK was around 5pm today and friend states she called her around 7 pm and says pt was confused. Endorses generalized weakness and numbness. Denies headache but states that her head feels funny. No facial droop. Pt on aspirin 

## 2023-09-20 NOTE — ED Provider Notes (Signed)
 [place] EMERGENCY DEPARTMENT AT [facility] Provider Note   CSN: [id number] Arrival date & time: 09/20/23  2014     Patient presents with: No chief complaint on file.   Grace [name] is a 78 y.o. female.   Pt is a 78 yo female with pmhx significant for hypothyroidism, hld, tia, migraine, cva, and skin cancer.  Pt's LKW was at 1700 today.  A friend called her at 47 and noticed that she was slurring her words.  Pt was having trouble finding her words.  She said she had some vision problems in her left eye.  She has generalized weakness.  No facial droop.         Prior to Admission medications   Medication Sig Start Date End Date Taking? Authorizing Provider  ALPRAZolam  (XANAX ) 0.25 MG tablet Take 1/2-1 tablet po BID prn anxiety. CAUTION:DROWSINESS 07/15/21   Cook, Jayce G, DO  aspirin  EC 81 MG tablet Take 81 mg by mouth once a week. Swallow whole. Patient not taking: Reported on 08/31/2023    [provider]  ezetimibe  (ZETIA ) 10 MG tablet TAKE ONE TABLET BY MOUTH ONCE DAILY. 07/01/21   Alphonsa Glendia LABOR, MD  levothyroxine  (SYNTHROID ) 75 MCG tablet TAKE 1 TABLET TUESDAY TO   SUNDAY AND 1/2 TABLET ON   MONDAY Patient taking differently: TAKE 1 TABLET TUESDAY THROUGH  SUNDAY AND 1/2 TABLET ON   MONDAY 08/06/21   Alphonsa Glendia LABOR, MD  Multiple Vitamins-Minerals (ALIVE WOMENS 50+ PO) Take 1 tablet by mouth daily.    [provider]    Allergies: Compazine, Prochlorperazine, Statins, and Sulfa drugs cross reactors    Review of Systems  Eyes:  Positive for visual disturbance.  Neurological:        Mild aphasia  All other systems reviewed and are negative.   Updated Vital Signs BP (!) 154/83   Pulse 63   Temp 97.6 F (36.4 C) (Oral)   Resp 18   Wt 53.3 kg   SpO2 98%   BMI 22.22 kg/m   Physical Exam Vitals and nursing note reviewed.  Constitutional:      Appearance: Normal appearance.  HENT:     Head: Normocephalic and atraumatic.     Right Ear:  External ear normal.     Left Ear: External ear normal.     Nose: Nose normal.     Mouth/Throat:     Mouth: Mucous membranes are moist.     Pharynx: Oropharynx is clear.   Eyes:     Extraocular Movements: Extraocular movements intact.     Conjunctiva/sclera: Conjunctivae normal.     Pupils: Pupils are equal, round, and reactive to light.    Cardiovascular:     Rate and Rhythm: Normal rate and regular rhythm.     Pulses: Normal pulses.     Heart sounds: Normal heart sounds.  Pulmonary:     Effort: Pulmonary effort is normal.     Breath sounds: Normal breath sounds.  Abdominal:     General: Abdomen is flat.     Palpations: Abdomen is soft.   Musculoskeletal:        General: Normal range of motion.     Cervical back: Normal range of motion and neck supple.   Skin:    General: Skin is warm.     Capillary Refill: Capillary refill takes less than 2 seconds.   Neurological:     General: No focal deficit present.     Mental Status:  She is alert and oriented to person, place, and time.     Comments: Mild aphasia Decreased vision left eye  Psychiatric:        Mood and Affect: Mood normal.        Behavior: Behavior normal.        Thought Content: Thought content normal.     (all labs ordered are listed, but only abnormal results are displayed) Labs Reviewed  COMPREHENSIVE METABOLIC PANEL WITH GFR - Abnormal; Notable for the following components:      Result Value   Sodium 134 (*)    Chloride 97 (*)    All other components within normal limits  ETHANOL  PROTIME-INR  APTT  CBC  DIFFERENTIAL  RAPID URINE DRUG SCREEN, HOSP PERFORMED  TSH  CBG MONITORING, ED    EKG: None  Radiology: CT ANGIO HEAD NECK W WO CM Result Date: 09/20/2023 CLINICAL DATA:  Acute neurologic deficit EXAM: CT ANGIOGRAPHY HEAD AND NECK WITH AND WITHOUT CONTRAST TECHNIQUE: Multidetector CT imaging of the head and neck was performed using the standard protocol during bolus administration of  intravenous contrast. Multiplanar CT image reconstructions and MIPs were obtained to evaluate the vascular anatomy. Carotid stenosis measurements (when applicable) are obtained utilizing NASCET criteria, using the distal internal carotid diameter as the denominator. RADIATION DOSE REDUCTION: This exam was performed according to the departmental dose-optimization program which includes automated exposure control, adjustment of the mA and/or kV according to patient size and/or use of iterative reconstruction technique. CONTRAST:  75mL OMNIPAQUE  IOHEXOL  350 MG/ML SOLN COMPARISON:  None Available. FINDINGS: CTA NECK FINDINGS Skeleton: No acute abnormality or high grade bony spinal canal stenosis. Other neck: Normal pharynx, larynx and major salivary glands. No cervical lymphadenopathy. Unremarkable thyroid  gland. Upper chest: No pneumothorax or pleural effusion. No nodules or masses. Aortic arch: There is no calcific atherosclerosis of the aortic arch. Conventional 3 vessel aortic branching pattern. RIGHT carotid system: Normal without aneurysm, dissection or stenosis. LEFT carotid system: Normal without aneurysm, dissection or stenosis. Vertebral arteries: Codominant configuration. There is no dissection, occlusion or flow-limiting stenosis to the skull base (V1-V3 segments). CTA HEAD FINDINGS POSTERIOR CIRCULATION: Vertebral arteries are normal. No proximal occlusion of the anterior or inferior cerebellar arteries. Basilar artery is normal. Superior cerebellar arteries are normal. Posterior cerebral arteries are normal. ANTERIOR CIRCULATION: Intracranial internal carotid arteries are normal. Anterior cerebral arteries are normal. Middle cerebral arteries are normal. Venous sinuses: As permitted by contrast timing, patent. Anatomic variants: None Review of the MIP images confirms the above findings. IMPRESSION: Normal CTA of the head and neck. Electronically Signed   By: Franky Stanford M.D.   On: 09/20/2023 21:58   CT  HEAD CODE STROKE WO CONTRAST Result Date: 09/20/2023 CLINICAL DATA:  Code stroke.  Altered mental status EXAM: CT HEAD WITHOUT CONTRAST TECHNIQUE: Contiguous axial images were obtained from the base of the skull through the vertex without intravenous contrast. RADIATION DOSE REDUCTION: This exam was performed according to the departmental dose-optimization program which includes automated exposure control, adjustment of the mA and/or kV according to patient size and/or use of iterative reconstruction technique. COMPARISON:  None Available. FINDINGS: Brain: There is no mass, hemorrhage or extra-axial collection. The size and configuration of the ventricles and extra-axial CSF spaces are normal. The brain parenchyma is normal, without evidence of acute or chronic infarction. Vascular: No abnormal hyperdensity of the major intracranial arteries or dural venous sinuses. No intracranial atherosclerosis. Skull: The visualized skull base, calvarium and extracranial soft tissues  are normal. Sinuses/Orbits: No fluid levels or advanced mucosal thickening of the visualized paranasal sinuses. No mastoid or middle ear effusion. The orbits are normal. ASPECTS [facility] Stroke Program Early CT Score) - Ganglionic level infarction (caudate, lentiform nuclei, internal capsule, insula, M1-M3 cortex): 7 - Supraganglionic infarction (M4-M6 cortex): 3 Total score (0-10 with 10 being normal): 10 IMPRESSION: 1. No acute intracranial abnormality. 2. ASPECTS is 10. These results were called by telephone at the time of interpretation on 09/20/2023 at 8:48 pm to provider Ayeisha Lindenberger , who verbally acknowledged these results. Electronically Signed   By: Franky Stanford M.D.   On: 09/20/2023 20:48     Procedures   Medications Ordered in the ED  iohexol  (OMNIPAQUE ) 350 MG/ML injection 75 mL (75 mLs Intravenous Contrast Given 09/20/23 2124)  clopidogrel (PLAVIX) tablet 300 mg (300 mg Oral Given 09/20/23 2212)                                     Medical Decision Making Amount and/or Complexity of Data Reviewed Labs: ordered. Radiology: ordered.  Risk Prescription drug management. Decision regarding hospitalization.   This patient presents to the ED for concern of cva, this involves an extensive number of treatment options, and is a complaint that carries with it a high risk of complications and morbidity.  The differential diagnosis includes cva, tia, electrolyte abn, infection   Co morbidities that complicate the patient evaluation  hypothyroidism, hld, tia, migraine, cva, and skin cancer   Additional history obtained:  Additional history obtained from epic chart review External records from outside source obtained and reviewed including friend   Lab Tests:  I Ordered, and personally interpreted labs.  The pertinent results include:  cbc nl, cmp nl, inr nl, etoh neg; uds neg   Imaging Studies ordered:  I ordered imaging studies including ct head, cta head/neck  I independently visualized and interpreted imaging which showed  CT head:  No acute intracranial abnormality.  2. ASPECTS is 10.  CTA head/neck: Normal CTA of the head and neck.  I agree with the radiologist interpretation   Cardiac Monitoring:  The patient was maintained on a cardiac monitor.  I personally viewed and interpreted the cardiac monitored which showed an underlying rhythm of: nsr   Medicines ordered and prescription drug management:  I ordered medication including plavix  for tia  Reevaluation of the patient after these medicines showed that the patient improved I have reviewed the patients home medicines and have made adjustments as needed   Test Considered:  mri   Critical Interventions:  Code stroke   Consultations Obtained:  I requested consultation with the neurologist (Dr. Gwynda),  and discussed lab and imaging findings as well as pertinent plan -he recommended cta head/neck, mri in the am Pt d/w Dr. Keturah  (triad) for admission  Problem List / ED Course:  Stroke sx:  code stroke called.  Sx are not bad enough to require tnk.  No lvo for IR.  Pt's sx have much improved now.   Reevaluation:  After the interventions noted above, I reevaluated the patient and found that they have :improved   Social Determinants of Health:  Lives at home   Dispostion:  After consideration of the diagnostic results and the patients response to treatment, I feel that the patent would benefit from admission.       Final diagnoses:  TIA (transient ischemic attack)  ED Discharge Orders     None          Dean Clarity, MD 09/20/23 2236

## 2023-09-20 NOTE — Consult Note (Signed)
 [facility] Consult Services   Patient Name:   Grace [name], Grace [name] Date of Birth:   1946-01-10 Identification Number:   MRN - [id number] Date of Service:   09/20/2023 20:40:25  Diagnosis:       R47.81 - Slurred speech       R51.9 - Headache, unspecified  Impression:      78 year old female history of previous stroke without residual deficits, hypertension, hyperlipidemia, hypothyroidism,temporal headaches I am seeing as a stroke alert for speech difficulty.    Patient being seen for headache, blurry vision, slowed speech. Neuroexam showing no focal deficits, she was slow to answer month but did answer correctly, no evidence of aphasia, no focal weakness. CT head negative for acute process.    At this time, suspect likely migraine aura causing her symptoms. She has had episodes of headache, blurry vision and speech changes in 2021, 2023. In 2021 she had an MRI brain that was negative for acute process. Stroke also on differential but seems less likely.    Would suggest obtaining routine CTA head and neck, routine MRI brain noncontrast to rule out ischemic process. She can be loaded with clopidogrel 300 mg x 1. She can be given migraine abortives for headache. Will hold on thrombolytics given NIHSS 0 at this time.       Our recommendations are outlined below.  Recommendations:        Neuro Checks (Q4)       IV Fluids, Normal Saline       -Plavix 300 mg x1       - Migraine abortives: reglan , toradol , IV magnesium  2 gms,       - CTA HN       - MRI brain non con routine  Sign Out:       Discussed with Emergency Department Provider    ------------------------------------------------------------------------------  Advanced Imaging: Advanced Imaging Deferred because:  Stroke not suspected with clinical presentation and exam   Metrics: Last Known Well: 09/20/2023 17:00:00 Dispatch Time: 09/20/2023 20:40:25 Arrival Time: 09/20/2023 20:14:00 Initial  Response Time: 09/20/2023 20:44:57 Symptoms: speech difficulty . Initial patient interaction: 09/20/2023 20:49:07 NIHSS Assessment Completed: 09/20/2023 20:58:22 Patient is not a candidate for Thrombolytic. Thrombolytic Medical Decision: 09/20/2023 20:58:23 Patient was not deemed candidate for Thrombolytic because of following reasons: other diagnosis suspected migraine aura. Resolved symptoms .  CT Head: I personally reviewed all the CT images that were available to me and it showed: no acute process  Primary Provider Notified of Diagnostic Impression and Management Plan on: 09/20/2023 21:15:39    ------------------------------------------------------------------------------  History of Present Illness: Patient is a 78 year old Female.  Patient was brought by private transportation with symptoms of speech difficulty . 78 year old female history of previous stroke without residual deficits, hypertension, hyperlipidemia, hypothyroidism,temporal headaches I am seeing as a stroke alert for speech difficulty.  Patient states that today she has been doing well and not around 5 PM her friend called her and asked her how her plans were but patient did not answer. She asked again patient replied with I do not know what you are talking about. She also stated that her head hurt and she did not feel right. Friend called again at 7:40 PM and patient still was slow to respond. She was brought to the emergency room where currently she is speaking better but still a little delay with responding. She is able to move all 4 extremities. She describes pressure in bilateral temples. She denies history of migraines. She denies  weakness numbness tingling to face arms or legs.  Upon review of her notes, 06/2021 she did have similar episode of headache in both temples, blurry vision. She had ESR and CRP done which was unrevealing. She also had a similar episode in 12/2019 of headache, blurred vision, MRI brain was  negative for stroke at that time.   Past Medical History:      Hypertension      Hyperlipidemia      Stroke  Medications:  No Anticoagulant use  Antiplatelet use: Yes asa Reviewed EMR for current medications  Allergies:  Reviewed  Social History: Smoking: No  Family History:  There is no family history of premature cerebrovascular disease pertinent to this consultation  ROS : 14 Points Review of Systems was performed and was negative except mentioned in HPI.  Past Surgical History: There Is No Surgical History Contributory To Today's Visit    Examination: BP(165/73), Pulse(62), 1A: Level of Consciousness - Alert; keenly responsive + 0 1B: Ask Month and Age - Both Questions Right + 0 1C: Blink Eyes & Squeeze Hands - Performs Both Tasks + 0 2: Test Horizontal Extraocular Movements - Normal + 0 3: Test Visual Fields - No Visual Loss + 0 4: Test Facial Palsy (Use Grimace if Obtunded) - Normal symmetry + 0 5A: Test Left Arm Motor Drift - No Drift for 10 Seconds + 0 5B: Test Right Arm Motor Drift - No Drift for 10 Seconds + 0 6A: Test Left Leg Motor Drift - No Drift for 5 Seconds + 0 6B: Test Right Leg Motor Drift - No Drift for 5 Seconds + 0 7: Test Limb Ataxia (FNF/Heel-Shin) - No Ataxia + 0 8: Test Sensation - Normal; No sensory loss + 0 9: Test Language/Aphasia - Normal; No aphasia + 0 10: Test Dysarthria - Normal + 0 11: Test Extinction/Inattention - No abnormality + 0  NIHSS Score: 0   Pre-Morbid Modified Rankin Scale: Unable to assess  Spoke with : Dr Dean I reviewed the available imaging via [facility] and initiated discussion with the primary provider  This consult was conducted in real time using interactive audio and Immunologist. Patient was informed of the technology being used for this visit and agreed to proceed. Patient located in hospital and provider located at home/office setting.   Patient is being evaluated for possible acute neurologic  impairment and high probability of imminent or life-threatening deterioration. I spent total of 35 minutes providing care to this patient, including time for face to face visit via telemedicine, review of medical records, imaging studies and discussion of findings with providers, the patient and/or family.   Dr Oneil Evert   [facility] For Inpatient follow-up with [facility] physician please call [facility] at [phone]. As we are not an outpatient service for any post hospital discharge needs please contact the hospital for assistance. If you have any questions for the [facility] physicians or need to reconsult for clinical or diagnostic changes please contact us  via [facility] at [phone].

## 2023-09-20 NOTE — ED Notes (Signed)
 Melissa, daughter was updated at this time via phone about pt's status and where the pt is going upstairs.

## 2023-09-20 NOTE — H&P (Signed)
 History and Physical    Grace [name] FMW:[id number] DOB: 12-20-1945 DOA: 09/20/2023  PCP: Orpha Yancey LABOR, MD   Patient coming from: {Blank single:19197::Home,Clinic,SNF,ALF,Group Home,Hospice,***}   Chief Complaint: No chief complaint on file.   HPI:  Grace [name] is a 78 y.o. female with hx of ***    Review of Systems:  ROS complete and negative except as marked above   Allergies  Allergen Reactions   Compazine Other (See Comments)    Slurred speech   Prochlorperazine Other (See Comments)    Unknown reaction   Statins Other (See Comments)    myalgias   Sulfa Drugs Cross Reactors Rash    Prior to Admission medications   Medication Sig Start Date End Date Taking? Authorizing Provider  ALPRAZolam  (XANAX ) 0.25 MG tablet Take 1/2-1 tablet po BID prn anxiety. CAUTION:DROWSINESS 07/15/21   Cook, Jayce G, DO  aspirin  EC 81 MG tablet Take 81 mg by mouth once a week. Swallow whole. Patient not taking: Reported on 08/31/2023    [provider]  ezetimibe  (ZETIA ) 10 MG tablet TAKE ONE TABLET BY MOUTH ONCE DAILY. 07/01/21   Alphonsa Glendia LABOR, MD  levothyroxine  (SYNTHROID ) 75 MCG tablet TAKE 1 TABLET TUESDAY TO   SUNDAY AND 1/2 TABLET ON   MONDAY Patient taking differently: TAKE 1 TABLET TUESDAY THROUGH  SUNDAY AND 1/2 TABLET ON   MONDAY 08/06/21   Alphonsa Glendia LABOR, MD  Multiple Vitamins-Minerals (ALIVE WOMENS 50+ PO) Take 1 tablet by mouth daily.    [provider]    Past Medical History:  Diagnosis Date   Dry eyes, bilateral    Dyspnea    Frequency of urination    Heart murmur    Hyperlipidemia    Hypothyroid    Migraine    Mitral valve prolapse    Skin cancer    Stroke [facility])    TIA (transient ischemic attack) 02/19/2012    Past Surgical History:  Procedure Laterality Date   BALLOON DILATION N/A 07/28/2013   Procedure: BALLOON DILATION;  Surgeon: Claudis RAYMOND Rivet, MD;  [facility];  Service: Endoscopy;  Laterality: N/A;   BIOPSY   02/16/2020   Procedure: BIOPSY;  Surgeon: Rivet Claudis RAYMOND, MD;  [facility];  Service: Endoscopy;;   BIOPSY  12/30/2022   Procedure: BIOPSY;  Surgeon: Eartha Angelia Sieving, MD;  [facility];  Service: Gastroenterology;;   BREAST BIOPSY Left 04/09/2022   BENIGN BREAST PARENCHYMA WITH FIBROADENOMATOID CHANGES AND CALCIFICATION   BREAST EXCISIONAL BIOPSY Left    benign-pt unsure when   CATARACT EXTRACTION W/PHACO Right 07/11/2013   Procedure: CATARACT EXTRACTION PHACO AND INTRAOCULAR LENS PLACEMENT (IOC);  Surgeon: Cherene Mania, MD;  Location: [facility];  Service: Ophthalmology;  Laterality: Right;  CDE 9.99   CATARACT EXTRACTION W/PHACO Left 08/04/2013   Procedure: CATARACT EXTRACTION PHACO AND INTRAOCULAR LENS PLACEMENT (IOC);  Surgeon: Cherene Mania, MD;  Location: [facility];  Service: Ophthalmology;  Laterality: Left;  CDE:16.26   CHOLECYSTECTOMY     COLONOSCOPY  12/18/2010   Procedure: COLONOSCOPY;  Surgeon: Claudis RAYMOND Rivet, MD;  [facility];  Service: Endoscopy;  Laterality: N/A;  1:00 pm   COLONOSCOPY N/A 03/06/2016   Procedure: COLONOSCOPY;  Surgeon: Claudis RAYMOND Rivet, MD;  [facility];  Service: Endoscopy;  Laterality: N/A;  1200   COLONOSCOPY N/A 04/18/2021   Procedure: COLONOSCOPY;  Surgeon: Rivet Claudis RAYMOND, MD;  [facility];  Service: Endoscopy;  Laterality:  N/A;  805   ESOPHAGEAL DILATION N/A 01/27/2018   Procedure: ESOPHAGEAL DILATION;  Surgeon: Golda Claudis PENNER, MD;  [facility];  Service: Endoscopy;  Laterality: N/A;   ESOPHAGEAL DILATION N/A 02/16/2020   Procedure: ESOPHAGEAL DILATION;  Surgeon: Golda Claudis PENNER, MD;  [facility];  Service: Endoscopy;  Laterality: N/A;   ESOPHAGOGASTRODUODENOSCOPY     ESOPHAGOGASTRODUODENOSCOPY N/A 07/28/2013   Procedure: ESOPHAGOGASTRODUODENOSCOPY (EGD);  Surgeon: Claudis PENNER Golda, MD;  [facility];  Service: Endoscopy;  Laterality: N/A;  125    ESOPHAGOGASTRODUODENOSCOPY N/A 01/27/2018   Procedure: ESOPHAGOGASTRODUODENOSCOPY (EGD);  Surgeon: Golda Claudis PENNER, MD;  [facility];  Service: Endoscopy;  Laterality: N/A;  2:00-moved to 10/30 @ 2:45pm per Ann   ESOPHAGOGASTRODUODENOSCOPY N/A 02/16/2020   Procedure: ESOPHAGOGASTRODUODENOSCOPY (EGD);  Surgeon: Golda Claudis PENNER, MD;  [facility];  Service: Endoscopy;  Laterality: N/A;  10:00   ESOPHAGOGASTRODUODENOSCOPY (EGD) WITH PROPOFOL  N/A 12/30/2022   Procedure: ESOPHAGOGASTRODUODENOSCOPY (EGD) WITH PROPOFOL ;  Surgeon: Eartha Angelia Sieving, MD;  [facility];  Service: Gastroenterology;  Laterality: N/A;  12:45 pm, asa 3   LEFT HEART CATH AND CORONARY ANGIOGRAPHY N/A 07/17/2017   Procedure: LEFT HEART CATH AND CORONARY ANGIOGRAPHY;  Surgeon: Wonda Sharper, MD;  Location: [facility] INVASIVE CV LAB;  Service: Cardiovascular;  Laterality: N/A;   MALONEY DILATION N/A 07/28/2013   Procedure: AGAPITO DILATION;  Surgeon: Claudis PENNER Golda, MD;  [facility];  Service: Endoscopy;  Laterality: N/A;   SAVORY DILATION N/A 07/28/2013   Procedure: SAVORY DILATION;  Surgeon: Claudis PENNER Golda, MD;  [facility];  Service: Endoscopy;  Laterality: N/A;   THYROIDECTOMY  11/20/2011   Procedure: THYROIDECTOMY;  Surgeon: Ida Loader, MD;  Location: [facility] OR;  Service: ENT;  Laterality: Left;  LEFT THYROID  LOBECTOMY   TUBAL LIGATION     38 yrs ago.     reports that she has never smoked. She has never used smokeless tobacco. She reports that she does not drink alcohol and does not use drugs.  Family History  Problem Relation Age of Onset   Heart disease Mother    Hypertension Mother    Stroke Mother    Thyroid  disease Mother    Dementia Mother    Stroke Maternal Grandmother    Cancer Paternal Grandmother        breast   Colon cancer Neg Hx      Physical Exam: Vitals:   09/20/23 2019 09/20/23 2020 09/20/23 2100  BP:  (!) 154/83 (!) 146/76  Pulse:   63   Resp:  18 19  Temp:  97.6 F (36.4 C)   TempSrc:  Oral   SpO2:  98% 99%  Weight: 53.3 kg      Gen: Awake, alert, NAD ***  CV: Regular, normal S1, S2, no murmurs  Resp: Normal WOB, CTAB  Abd: Flat, normoactive, nontender MSK: Symmetric, no edema  Skin: No rashes or lesions to exposed skin  Neuro: Alert and interactive  Psych: euthymic, appropriate    Data review:   Labs reviewed, notable for:  ***  Micro:  Results for orders placed or performed in visit on 08/27/21  Novel Coronavirus, NAA ([facility])     Status: None   Collection Time: 08/27/21 12:00 AM   Specimen: Nasopharyngeal(NP) swabs in vial transport medium   Nasopharynge  Previous  Result Value Ref Range Status   SARS-CoV-2, NAA Not Detected Not Detected Final    Comment: This  nucleic acid amplification test was developed and its performance characteristics determined by [employer]. Nucleic acid amplification tests include RT-PCR and TMA. This test has not been FDA cleared or approved. This test has been authorized by FDA under an Emergency Use Authorization (EUA). This test is only authorized for the duration of time the declaration that circumstances exist justifying the authorization of the emergency use of in vitro diagnostic tests for detection of SARS-CoV-2 virus and/or diagnosis of COVID-19 infection under section 564(b)(1) of the Act, 21 U.S.C. 639aaa-6(a) (1), unless the authorization is terminated or revoked sooner. When diagnostic testing is negative, the possibility of a false negative result should be considered in the context of a patient's recent exposures and the presence of clinical signs and symptoms consistent with COVID-19. An individual without symptoms of COVID-19 and who is not shedding SARS-CoV-2 virus wo uld expect to have a negative (not detected) result in this assay.     Imaging reviewed:  CT ANGIO HEAD NECK W WO CM Result Date: 09/20/2023 CLINICAL DATA:  Acute  neurologic deficit EXAM: CT ANGIOGRAPHY HEAD AND NECK WITH AND WITHOUT CONTRAST TECHNIQUE: Multidetector CT imaging of the head and neck was performed using the standard protocol during bolus administration of intravenous contrast. Multiplanar CT image reconstructions and MIPs were obtained to evaluate the vascular anatomy. Carotid stenosis measurements (when applicable) are obtained utilizing NASCET criteria, using the distal internal carotid diameter as the denominator. RADIATION DOSE REDUCTION: This exam was performed according to the departmental dose-optimization program which includes automated exposure control, adjustment of the mA and/or kV according to patient size and/or use of iterative reconstruction technique. CONTRAST:  75mL OMNIPAQUE  IOHEXOL  350 MG/ML SOLN COMPARISON:  None Available. FINDINGS: CTA NECK FINDINGS Skeleton: No acute abnormality or high grade bony spinal canal stenosis. Other neck: Normal pharynx, larynx and major salivary glands. No cervical lymphadenopathy. Unremarkable thyroid  gland. Upper chest: No pneumothorax or pleural effusion. No nodules or masses. Aortic arch: There is no calcific atherosclerosis of the aortic arch. Conventional 3 vessel aortic branching pattern. RIGHT carotid system: Normal without aneurysm, dissection or stenosis. LEFT carotid system: Normal without aneurysm, dissection or stenosis. Vertebral arteries: Codominant configuration. There is no dissection, occlusion or flow-limiting stenosis to the skull base (V1-V3 segments). CTA HEAD FINDINGS POSTERIOR CIRCULATION: Vertebral arteries are normal. No proximal occlusion of the anterior or inferior cerebellar arteries. Basilar artery is normal. Superior cerebellar arteries are normal. Posterior cerebral arteries are normal. ANTERIOR CIRCULATION: Intracranial internal carotid arteries are normal. Anterior cerebral arteries are normal. Middle cerebral arteries are normal. Venous sinuses: As permitted by contrast  timing, patent. Anatomic variants: None Review of the MIP images confirms the above findings. IMPRESSION: Normal CTA of the head and neck. Electronically Signed   By: Franky Stanford M.D.   On: 09/20/2023 21:58   CT HEAD CODE STROKE WO CONTRAST Result Date: 09/20/2023 CLINICAL DATA:  Code stroke.  Altered mental status EXAM: CT HEAD WITHOUT CONTRAST TECHNIQUE: Contiguous axial images were obtained from the base of the skull through the vertex without intravenous contrast. RADIATION DOSE REDUCTION: This exam was performed according to the departmental dose-optimization program which includes automated exposure control, adjustment of the mA and/or kV according to patient size and/or use of iterative reconstruction technique. COMPARISON:  None Available. FINDINGS: Brain: There is no mass, hemorrhage or extra-axial collection. The size and configuration of the ventricles and extra-axial CSF spaces are normal. The brain parenchyma is normal, without evidence of acute or chronic infarction. Vascular: No abnormal hyperdensity  of the major intracranial arteries or dural venous sinuses. No intracranial atherosclerosis. Skull: The visualized skull base, calvarium and extracranial soft tissues are normal. Sinuses/Orbits: No fluid levels or advanced mucosal thickening of the visualized paranasal sinuses. No mastoid or middle ear effusion. The orbits are normal. ASPECTS [facility] Stroke Program Early CT Score) - Ganglionic level infarction (caudate, lentiform nuclei, internal capsule, insula, M1-M3 cortex): 7 - Supraganglionic infarction (M4-M6 cortex): 3 Total score (0-10 with 10 being normal): 10 IMPRESSION: 1. No acute intracranial abnormality. 2. ASPECTS is 10. These results were called by telephone at the time of interpretation on 09/20/2023 at 8:48 pm to provider JULIE HAVILAND , who verbally acknowledged these results. Electronically Signed   By: Franky Stanford M.D.   On: 09/20/2023 20:48    EKG: ***   ED Course: ***     Assessment/Plan:  78 y.o. female with hx ***   Body mass index is 22.22 kg/m. ***   DVT prophylaxis:  {Blank single:19197::Lovenox ,SQ Heparin ,IV heparin  gtts,Xarelto,Eliquis,Coumadin,SCDs,***} Code Status:  {Blank single:19197::Full Code,DNR with Intubation,DNR/DNI(Do NOT Intubate),Comfort Care,***} Diet:  Diet Orders (From admission, onward)     Start     Ordered   09/20/23 2245  Diet Heart Room service appropriate? Yes; Fluid consistency: Thin  Diet effective now       Comments: Swallow screen then ok for [facility] diet if passed  Question Answer Comment  Room service appropriate? Yes   Fluid consistency: Thin      09/20/23 2247           Family Communication:  ***  Consults:  ***  Admission status:   {Blank single:19197::Observation,Inpatient}, {Blank single:19197::Med-Surg,Telemetry bed,Step Down Unit}  Severity of Illness: {Observation/Inpatient:21159}   Dorn Dawson, MD [facility]  How to contact the [facility] Attending or Consulting provider 7A - 7P or covering provider during after hours 7P -7A, for this patient.  Check the care team in [facility] and look for a) attending/consulting [facility] provider listed and b) the [facility] team listed Log into [email or web address] and use [place]'s universal password to access. If you do not have the password, please contact the hospital operator. Locate the [facility] provider you are looking for under [facility] and page to a number that you can be directly reached. If you still have difficulty reaching the provider, please page the [facility] (Director on Call) for the Hospitalists listed on amion for assistance.  09/20/2023, 10:50 PM

## 2023-09-20 NOTE — Progress Notes (Signed)
 2014 arrival POV 2021 Edp assigned 2028 elert  LKW 1700  Slow to speak and slurring per friend.  2036 down to scan 2040 page to [facility] 2043 back from scan 2048 Dr. Gwynda on screen 2058 NCCT relayed to Dr. Gwynda  No tnk

## 2023-09-21 ENCOUNTER — Observation Stay (HOSPITAL_COMMUNITY)

## 2023-09-21 DIAGNOSIS — F419 Anxiety disorder, unspecified: Secondary | ICD-10-CM

## 2023-09-21 DIAGNOSIS — G459 Transient cerebral ischemic attack, unspecified: Secondary | ICD-10-CM

## 2023-09-21 DIAGNOSIS — F32A Depression, unspecified: Secondary | ICD-10-CM | POA: Diagnosis not present

## 2023-09-21 DIAGNOSIS — R2990 Unspecified symptoms and signs involving the nervous system: Secondary | ICD-10-CM | POA: Diagnosis not present

## 2023-09-21 DIAGNOSIS — G43909 Migraine, unspecified, not intractable, without status migrainosus: Secondary | ICD-10-CM | POA: Insufficient documentation

## 2023-09-21 DIAGNOSIS — R413 Other amnesia: Secondary | ICD-10-CM

## 2023-09-21 LAB — ECHOCARDIOGRAM COMPLETE
AR max vel: 2.34 cm2
AV Area VTI: 2.32 cm2
AV Area mean vel: 2.23 cm2
AV Mean grad: 1.9 mmHg
AV Peak grad: 3.5 mmHg
Ao pk vel: 0.93 m/s
Area-P 1/2: 2.55 cm2
Calc EF: 69 %
S' Lateral: 2.3 cm
Single Plane A2C EF: 65.8 %
Single Plane A4C EF: 71.2 %
Weight: 1881.6 [oz_av]

## 2023-09-21 LAB — LIPID PANEL
Cholesterol: 125 mg/dL (ref 0–200)
HDL: 41 mg/dL (ref >40)
LDL Cholesterol: 72 mg/dL (ref 0–99)
Total CHOL/HDL Ratio: 3 ratio
Triglycerides: 59 mg/dL (ref <150)
VLDL: 12 mg/dL (ref 0–40)

## 2023-09-21 LAB — HEMOGLOBIN A1C
Hgb A1c MFr Bld: 5 % (ref 4.8–5.6)
Mean Plasma Glucose: 96.8 mg/dL

## 2023-09-21 LAB — TSH: TSH: 1.666 u[IU]/mL (ref 0.350–4.500)

## 2023-09-21 LAB — SEDIMENTATION RATE: Sed Rate: 7 mm/h (ref 0–30)

## 2023-09-21 MED ORDER — MIRTAZAPINE 15 MG PO TABS
15.0000 mg | ORAL_TABLET | Freq: Every day | ORAL | 2 refills | Status: AC
Start: 2023-09-21 — End: ?

## 2023-09-21 MED ORDER — PANTOPRAZOLE SODIUM 40 MG PO TBEC: 40.0000 mg | DELAYED_RELEASE_TABLET | Freq: Every day | ORAL | 1 refills | Status: DC

## 2023-09-21 MED ORDER — PANTOPRAZOLE SODIUM 40 MG PO TBEC
40.0000 mg | DELAYED_RELEASE_TABLET | Freq: Every day | ORAL | Status: DC
  Administered 2023-09-21: 40 mg via ORAL
  Filled 2023-09-21: qty 1

## 2023-09-21 MED ORDER — CLOPIDOGREL BISULFATE 75 MG PO TABS
75.0000 mg | ORAL_TABLET | Freq: Every day | ORAL | Status: DC
  Administered 2023-09-21: 75 mg via ORAL
  Filled 2023-09-21: qty 1

## 2023-09-21 MED ORDER — VITAMIN B-12 1000 MCG PO TABS
1000.0000 ug | ORAL_TABLET | Freq: Every day | ORAL | Status: DC
  Administered 2023-09-21: 1000 ug via ORAL
  Filled 2023-09-21: qty 1

## 2023-09-21 MED ORDER — CYANOCOBALAMIN 1000 MCG PO TABS: 1000.0000 ug | ORAL_TABLET | Freq: Every day | ORAL | 2 refills | Status: DC

## 2023-09-21 MED ORDER — ASPIRIN 81 MG PO TBEC
81.0000 mg | DELAYED_RELEASE_TABLET | Freq: Once | ORAL | Status: AC
  Administered 2023-09-21: 81 mg via ORAL
  Filled 2023-09-21: qty 1

## 2023-09-21 MED ORDER — CLOPIDOGREL BISULFATE 75 MG PO TABS: 75.0000 mg | ORAL_TABLET | Freq: Every day | ORAL | 2 refills | Status: DC

## 2023-09-21 MED ORDER — MIRTAZAPINE 15 MG PO TABS
15.0000 mg | ORAL_TABLET | Freq: Every day | ORAL | Status: DC
  Administered 2023-09-21: 15 mg via ORAL
  Filled 2023-09-21: qty 1

## 2023-09-21 NOTE — Discharge Summary (Signed)
 Physician Discharge Summary   Patient: Grace [name] MRN: [id number] DOB: July 09, 1945  Admit date:     09/20/2023  Discharge date: 09/21/23  Discharge Physician: Eric Nunnery   PCP: Orpha Yancey LABOR, MD   Recommendations at discharge:  Make sure patient follow-up with neurology as instructed Repeat B12 level in 8 weeks and determine the need for intramuscular supplementation if not responding to oral repletion. Repeat basic metabolic panel to follow electrolytes renal function Mini-Mental Status exam to assist with establishing diagnosis of underlying dementia.  Discharge Diagnoses: Principal Problem:   Stroke-like symptom Active Problems:   Anxiety and depression   Memory loss   Migraine  Brief Hospital admission narrative course: As per H&P written by Dr. Keturah on 09/20/2023 Grace [name] is a 78 y.o. female with hx of TIA, migraines, HFpEF, HTN, HLD, hypothyroidism, gastroparesis, who presents with episode of expressive aphasia, dysarthria and visual change accompanied by R temporal headache.    On chart review has had similar episodes dating back to at least 2013 which included episodes of blurred vision, balance change, and slurred speech. Ultimately was felt to have migraine with aura. While she carries a diagnosis of stroke, I do not see any imaging confirming stroke in her records.    On this admission reports similar symptoms. LKW ~ 4 PM per her report. But symptoms really worse around 7 PM, at that time was speaking to her friend over the phone and noted that she was having issues with word finding as well as slurred speech.  Her friend over the phone noted that speech was abnormal as well.  She was also having blurred vision thinks it is in both eyes but worse in the left.  Denies any homonymous visual field deficit.  Also notes that she is been having right temporal headaches which have been intermittent but recurrent over the last few days.  She is not sure how long her  symptoms lasted now she feels mostly back to her normal but still feels like something is off that she cannot put her finger on.  Reports difficulties with her memory and increased latency/effort with recall. Otherwise reports decreased oral intake without other GI symptoms.  She is on mirtazapine at night.    Assessment and Plan: Strokelike symptoms/TIA - MRI demonstrating no acute stroke - 2D echo without wall positive thrombi; preserved ejection fraction and no valvular disorder - A1c within normal limits - LDL 72; patient with underlying statin intolerance and actively receiving Setia - Following neurology recommendations patient's aspirin  has been transitioned to the use of Plavix for secondary prevention. - B12 supplementation has been started with intention to keep levels above 500. -Outpatient follow-up with neurology service in 4-6 weeks will be arranged.   2-chronic diastolic heart failure - Stable compensated - Indeterminate parameters seen on 2D echo during this hospitalization - Low-sodium diet and adequate hydration discussed with patient - Continue to follow daily weights - Prior to admission no using any diuretics.  3-essential hypertension - Resume home antihypertensive agents.  4-hypothyroidism - Continue Synthroid . - TSH was found to be within normal limits.  5-hyperlipidemia - Continue the use of Zetia  and follow heart healthy/low-fat diet.  6-depression/anxiety - Continue as needed Xanax  and has been started on nightly mirtazapine.  7-GERD/GI prophylaxis - Continue PPI.  Consultants: Teleneurology Procedures performed: See below for x-ray reports. Disposition: Home Diet recommendation: Heart healthy/low-fat diet.  DISCHARGE MEDICATION: Allergies as of 09/21/2023       Reactions  Compazine Other (See Comments)   Slurred speech   Prochlorperazine Other (See Comments)   Unknown reaction   Statins Other (See Comments)   myalgias   Sulfa Drugs  Cross Reactors Rash        Medication List     STOP taking these medications    aspirin  EC 81 MG tablet       TAKE these medications    ALIVE WOMENS 50+ PO Take 1 tablet by mouth daily.   ALPRAZolam  0.25 MG tablet Commonly known as: XANAX  Take 1/2-1 tablet po BID prn anxiety. CAUTION:DROWSINESS   clopidogrel 75 MG tablet Commonly known as: PLAVIX Take 1 tablet (75 mg total) by mouth daily. Start taking on: September 22, 2023   cyanocobalamin  1000 MCG tablet Take 1 tablet (1,000 mcg total) by mouth daily. Start taking on: September 22, 2023   ezetimibe  10 MG tablet Commonly known as: ZETIA  TAKE ONE TABLET BY MOUTH ONCE DAILY.   levothyroxine  75 MCG tablet Commonly known as: SYNTHROID  TAKE 1 TABLET TUESDAY TO   SUNDAY AND 1/2 TABLET ON   MONDAY What changed: additional instructions   mirtazapine 15 MG tablet Commonly known as: REMERON Take 1 tablet (15 mg total) by mouth at bedtime.   pantoprazole 40 MG tablet Commonly known as: PROTONIX Take 1 tablet (40 mg total) by mouth daily. Start taking on: September 22, 2023        Follow-up Information     Hasanaj, Xaje A, MD. Schedule an appointment as soon as possible for a visit in 10 day(s).   Specialty: Internal Medicine Contact information: [address] [place] [address] [phone]                Discharge Exam: Filed Weights   09/20/23 2019  Weight: 53.3 kg   General exam: Alert, awake, oriented x 3 Respiratory system: Clear to auscultation. Respiratory effort normal. Cardiovascular system:RRR. No murmurs, rubs, gallops. Gastrointestinal system: Abdomen is nondistended, soft and nontender. No organomegaly or masses felt. Normal bowel sounds heard. Central nervous system: Alert and oriented. No focal neurological deficits. Extremities: No C/C/E, +pedal pulses Skin: No rashes, lesions or ulcers Psychiatry: Judgement and insight appear normal. Mood & affect appropriate.    Condition at  discharge: Stable and improved.  The results of significant diagnostics from this hospitalization (including imaging, microbiology, ancillary and laboratory) are listed below for reference.   Imaging Studies: ECHOCARDIOGRAM COMPLETE Result Date: 09/21/2023    ECHOCARDIOGRAM REPORT   Patient Name:   Nemesis D Williamson Date of Exam: 09/21/2023 Medical Rec #:  [id number]     Height:       61.0 in Accession #:    [id number]    Weight:       117.6 lb Date of Birth:  05/10/1945    BSA:          1.507 m Patient Age:    77 years      BP:           137/71 mmHg Patient Gender: F             HR:           73  bpm. Exam Location:  [facility] Procedure: 2D Echo, Color Doppler and Cardiac Doppler (Both Spectral and Color            Flow Doppler were utilized during procedure). Indications:    TIA  History:        Patient has prior history of  Echocardiogram examinations, most                 recent 10/29/2022.  Sonographer:    Eva Lash Referring Phys: [id number] JONATHAN SEGARS IMPRESSIONS  1. Left ventricular ejection fraction, by estimation, is 60 to 65%. The left ventricle has normal function. The left ventricle has no regional wall motion abnormalities. Left ventricular diastolic parameters are indeterminate.  2. Right ventricular systolic function is normal. The right ventricular size is normal. Tricuspid regurgitation signal is inadequate for assessing PA pressure.  3. The mitral valve is normal in structure. No evidence of mitral valve regurgitation. No evidence of mitral stenosis.  4. The aortic valve is tricuspid. Aortic valve regurgitation is mild to moderate. No aortic stenosis is present.  5. The inferior vena cava is normal in size with greater than 50% respiratory variability, suggesting right atrial pressure of 3 mmHg.  6. Agitated saline contrast bubble study was negative, with no evidence of any interatrial shunt. FINDINGS  Left Ventricle: Left ventricular ejection fraction, by estimation, is 60 to 65%. The left  ventricle has normal function. The left ventricle has no regional wall motion abnormalities. The left ventricular internal cavity size was normal in size. There is  no left ventricular hypertrophy. Left ventricular diastolic parameters are indeterminate. Right Ventricle: The right ventricular size is normal. Right vetricular wall thickness was not well visualized. Right ventricular systolic function is normal. Tricuspid regurgitation signal is inadequate for assessing PA pressure. Left Atrium: Left atrial size was normal in size. Right Atrium: Right atrial size was normal in size. Pericardium: There is no evidence of pericardial effusion. Mitral Valve: The mitral valve is normal in structure. No evidence of mitral valve regurgitation. No evidence of mitral valve stenosis. Tricuspid Valve: The tricuspid valve is normal in structure. Tricuspid valve regurgitation is mild . No evidence of tricuspid stenosis. Aortic Valve: The aortic valve is tricuspid. Aortic valve regurgitation is mild to moderate. No aortic stenosis is present. Aortic valve mean gradient measures 1.9 mmHg. Aortic valve peak gradient measures 3.5 mmHg. Aortic valve area, by VTI measures 2.32 cm. Pulmonic Valve: The pulmonic valve was not well visualized. Pulmonic valve regurgitation is not visualized. No evidence of pulmonic stenosis. Aorta: The aortic root is normal in size and structure and the ascending aorta was not well visualized. Venous: The inferior vena cava is normal in size with greater than 50% respiratory variability, suggesting right atrial pressure of 3 mmHg. IAS/Shunts: No atrial level shunt detected by color flow Doppler. Agitated saline contrast bubble study was negative, with no evidence of any interatrial shunt.  LEFT VENTRICLE PLAX 2D LVIDd:         4.10 cm     Diastology LVIDs:         2.30 cm     LV e' medial:    5.98 cm/s LV PW:         0.90 cm     LV E/e' medial:  9.2 LV IVS:        1.00 cm     LV e' lateral:   9.14 cm/s LVOT  diam:     1.90 cm     LV E/e' lateral: 6.0 LV SV:         47 LV SV Index:   31 LVOT Area:     2.84 cm  LV Volumes (MOD) LV vol d, MOD A2C: 57.6 ml LV vol d, MOD A4C: 69.0 ml LV vol s, MOD A2C: 19.7 ml LV vol  s, MOD A4C: 19.9 ml LV SV MOD A2C:     37.9 ml LV SV MOD A4C:     69.0 ml LV SV MOD BP:      44.4 ml RIGHT VENTRICLE RV S prime:     11.40 cm/s TAPSE (M-mode): 1.4 cm LEFT ATRIUM             Index LA Vol (A2C):   14.2 ml 9.42 ml/m LA Vol (A4C):   21.0 ml 13.93 ml/m LA Biplane Vol: 17.2 ml 11.41 ml/m  AORTIC VALVE AV Area (Vmax):    2.34 cm AV Area (Vmean):   2.23 cm AV Area (VTI):     2.32 cm AV Vmax:           93.17 cm/s AV Vmean:          64.755 cm/s AV VTI:            0.203 m AV Peak Grad:      3.5 mmHg AV Mean Grad:      1.9 mmHg LVOT Vmax:         76.80 cm/s LVOT Vmean:        51.000 cm/s LVOT VTI:          0.166 m LVOT/AV VTI ratio: 0.82  AORTA Ao Asc diam: 2.90 cm MITRAL VALVE               TRICUSPID VALVE MV Area (PHT): 2.55 cm    TR Peak grad:   18.8 mmHg MV Decel Time: 298 msec    TR Vmax:        217.00 cm/s MV E velocity: 55.10 cm/s MV A velocity: 77.40 cm/s  SHUNTS MV E/A ratio:  0.71        Systemic VTI:  0.17 m                            Systemic Diam: 1.90 cm Dorn Ross MD Electronically signed by Dorn Ross MD Signature Date/Time: 09/21/2023/12:45:26 PM    Final    MR BRAIN WO CONTRAST Result Date: 09/21/2023 CLINICAL DATA:  Aphasia and L eye visual deficit, resolved. Stroke eval. Left-sided weakness. EXAM: MRI HEAD WITHOUT CONTRAST TECHNIQUE: Multiplanar, multiecho pulse sequences of the brain and surrounding structures were obtained without intravenous contrast. COMPARISON:  Head CT 09/20/2023 and MRI 01/16/2020 FINDINGS: Brain: There is no evidence of an acute infarct, intracranial hemorrhage, mass, midline shift, or extra-axial fluid collection. Mild cerebral atrophy is within normal limits for age. Scattered small T2 hyperintensities in the cerebral white matter are  similar to the prior MRI and are nonspecific but compatible with mild chronic small vessel ischemic disease. Vascular: Major intracranial vascular flow voids are preserved. Skull and upper cervical spine: Unremarkable bone marrow signal. Sinuses/Orbits: Bilateral cataract extraction. Minimal mucosal thickening in the right maxillary sinus. Clear mastoid air cells. Other: None. IMPRESSION: 1. No acute intracranial abnormality. 2. Mild chronic small vessel ischemic disease. Electronically Signed   By: Dasie Hamburg M.D.   On: 09/21/2023 10:07   CT ANGIO HEAD NECK W WO CM Result Date: 09/20/2023 CLINICAL DATA:  Acute neurologic deficit EXAM: CT ANGIOGRAPHY HEAD AND NECK WITH AND WITHOUT CONTRAST TECHNIQUE: Multidetector CT imaging of the head and neck was performed using the standard protocol during bolus administration of intravenous contrast. Multiplanar CT image reconstructions and MIPs were obtained to evaluate the vascular anatomy. Carotid stenosis measurements (when applicable) are obtained utilizing NASCET criteria, using  the distal internal carotid diameter as the denominator. RADIATION DOSE REDUCTION: This exam was performed according to the departmental dose-optimization program which includes automated exposure control, adjustment of the mA and/or kV according to patient size and/or use of iterative reconstruction technique. CONTRAST:  75mL OMNIPAQUE  IOHEXOL  350 MG/ML SOLN COMPARISON:  None Available. FINDINGS: CTA NECK FINDINGS Skeleton: No acute abnormality or high grade bony spinal canal stenosis. Other neck: Normal pharynx, larynx and major salivary glands. No cervical lymphadenopathy. Unremarkable thyroid  gland. Upper chest: No pneumothorax or pleural effusion. No nodules or masses. Aortic arch: There is no calcific atherosclerosis of the aortic arch. Conventional 3 vessel aortic branching pattern. RIGHT carotid system: Normal without aneurysm, dissection or stenosis. LEFT carotid system: Normal  without aneurysm, dissection or stenosis. Vertebral arteries: Codominant configuration. There is no dissection, occlusion or flow-limiting stenosis to the skull base (V1-V3 segments). CTA HEAD FINDINGS POSTERIOR CIRCULATION: Vertebral arteries are normal. No proximal occlusion of the anterior or inferior cerebellar arteries. Basilar artery is normal. Superior cerebellar arteries are normal. Posterior cerebral arteries are normal. ANTERIOR CIRCULATION: Intracranial internal carotid arteries are normal. Anterior cerebral arteries are normal. Middle cerebral arteries are normal. Venous sinuses: As permitted by contrast timing, patent. Anatomic variants: None Review of the MIP images confirms the above findings. IMPRESSION: Normal CTA of the head and neck. Electronically Signed   By: Franky Stanford M.D.   On: 09/20/2023 21:58   CT HEAD CODE STROKE WO CONTRAST Result Date: 09/20/2023 CLINICAL DATA:  Code stroke.  Altered mental status EXAM: CT HEAD WITHOUT CONTRAST TECHNIQUE: Contiguous axial images were obtained from the base of the skull through the vertex without intravenous contrast. RADIATION DOSE REDUCTION: This exam was performed according to the departmental dose-optimization program which includes automated exposure control, adjustment of the mA and/or kV according to patient size and/or use of iterative reconstruction technique. COMPARISON:  None Available. FINDINGS: Brain: There is no mass, hemorrhage or extra-axial collection. The size and configuration of the ventricles and extra-axial CSF spaces are normal. The brain parenchyma is normal, without evidence of acute or chronic infarction. Vascular: No abnormal hyperdensity of the major intracranial arteries or dural venous sinuses. No intracranial atherosclerosis. Skull: The visualized skull base, calvarium and extracranial soft tissues are normal. Sinuses/Orbits: No fluid levels or advanced mucosal thickening of the visualized paranasal sinuses. No mastoid  or middle ear effusion. The orbits are normal. ASPECTS [facility] Stroke Program Early CT Score) - Ganglionic level infarction (caudate, lentiform nuclei, internal capsule, insula, M1-M3 cortex): 7 - Supraganglionic infarction (M4-M6 cortex): 3 Total score (0-10 with 10 being normal): 10 IMPRESSION: 1. No acute intracranial abnormality. 2. ASPECTS is 10. These results were called by telephone at the time of interpretation on 09/20/2023 at 8:48 pm to provider JULIE HAVILAND , who verbally acknowledged these results. Electronically Signed   By: Franky Stanford M.D.   On: 09/20/2023 20:48    Microbiology: Results for orders placed or performed in visit on 08/27/21  Novel Coronavirus, NAA ([facility])     Status: None   Collection Time: 08/27/21 12:00 AM   Specimen: Nasopharyngeal(NP) swabs in vial transport medium   Nasopharynge  Previous  Result Value Ref Range Status   SARS-CoV-2, NAA Not Detected Not Detected Final    Comment: This nucleic acid amplification test was developed and its performance characteristics determined by [employer]. Nucleic acid amplification tests include RT-PCR and TMA. This test has not been FDA cleared or approved. This test has been authorized by FDA under an  Emergency Use Authorization (EUA). This test is only authorized for the duration of time the declaration that circumstances exist justifying the authorization of the emergency use of in vitro diagnostic tests for detection of SARS-CoV-2 virus and/or diagnosis of COVID-19 infection under section 564(b)(1) of the Act, 21 U.S.C. 639aaa-6(a) (1), unless the authorization is terminated or revoked sooner. When diagnostic testing is negative, the possibility of a false negative result should be considered in the context of a patient's recent exposures and the presence of clinical signs and symptoms consistent with COVID-19. An individual without symptoms of COVID-19 and who is not shedding SARS-CoV-2 virus wo uld  expect to have a negative (not detected) result in this assay.     Labs: CBC: Recent Labs  Lab 09/20/23 2033  WBC 7.4  NEUTROABS 4.1  HGB 14.0  HCT 40.3  MCV 89.8  PLT 221   Basic Metabolic Panel: Recent Labs  Lab 09/20/23 2033  NA 134*  K 3.8  CL 97*  CO2 27  GLUCOSE 96  BUN 8  CREATININE 0.69  CALCIUM  9.0   Liver Function Tests: Recent Labs  Lab 09/20/23 2033  AST 26  ALT 17  ALKPHOS 66  BILITOT 0.8  PROT 7.0  ALBUMIN 4.0   CBG: Recent Labs  Lab 09/20/23 2031  GLUCAP 84    Discharge time spent: greater than 30 minutes.  Signed: Eric Nunnery, MD [facility] 09/21/2023

## 2023-09-21 NOTE — TOC CM/SW Note (Signed)
 Transition of Care [facility]) - Inpatient Brief Assessment   Patient Details  Name: Grace [name] MRN: [id number] Date of Birth: 17-Oct-1945  Transition of Care [facility]) CM/SW Contact:    Lucie Lunger, LCSWA Phone Number: 09/21/2023, 9:33 AM   Clinical Narrative: Transition of Care Department [facility]) has reviewed patient and no TOC needs have been identified at this time. We will continue to monitor patient advancement through interdiciplinary progression rounds. If new patient transition needs arise, please place a TOC consult.  Transition of Care Asessment: Insurance and Status: Insurance coverage has been reviewed Patient has primary care physician: Yes Home environment has been reviewed: From home Prior level of function:: Independent Prior/Current Home Services: No current home services Social Drivers of Health Review: SDOH reviewed no interventions necessary Readmission risk has been reviewed: Yes Transition of care needs: no transition of care needs at this time

## 2023-09-21 NOTE — Progress Notes (Signed)
*  PRELIMINARY RESULTS* Echocardiogram 2D Echocardiogram has been performed.  Grace [name] 09/21/2023, 12:17 PM

## 2023-09-21 NOTE — Evaluation (Signed)
 Physical Therapy Evaluation Patient Details Name: Grace [name] MRN: [id number] DOB: [birth year] Today's Date: 09/21/2023  History of Present Illness  Grace [name] is a 78 y.o. female with hx of TIA, migraines, HFpEF, HTN, HLD, hypothyroidism, gastroparesis, who presents with episode of expressive aphasia, dysarthria and visual change accompanied by R temporal headache.      On chart review has had similar episodes dating back to at least 2013 which included episodes of blurred vision, balance change, and slurred speech. Ultimately was felt to have migraine with aura. While she carries a diagnosis of stroke, I do not see any imaging confirming stroke in her records.      On this admission reports similar symptoms. LKW ~ 4 PM per her report. But symptoms really worse around 7 PM, at that time was speaking to her friend over the phone and noted that she was having issues with word finding as well as slurred speech.  Her friend over the phone noted that speech was abnormal as well.  She was also having blurred vision thinks it is in both eyes but worse in the left.  Denies any homonymous visual field deficit.  Also notes that she is been having right temporal headaches which have been intermittent but recurrent over the last few days.  She is not sure how long her symptoms lasted now she feels mostly back to her normal but still feels like something is off that she cannot put her finger on.  Reports difficulties with her memory and increased latency/effort with recall. Otherwise reports decreased oral intake without other GI symptoms.  She is on mirtazapine at night.   Clinical Impression  Patient functioning at baseline for functional mobility and gait demonstrating good return for ambulating in room, hallways without need for an AD and no loss of balance. Plan:  Patient discharged from physical therapy to care of nursing for ambulation daily as tolerated for length of stay.          If plan is  discharge home, recommend the following: Other (comment) (Patient at baseline)   Can travel by private vehicle        Equipment Recommendations None recommended by PT  Recommendations for Other Services       Functional Status Assessment Patient has not had a recent decline in their functional status     Precautions / Restrictions Precautions Precautions: None Recall of Precautions/Restrictions: Intact Restrictions Weight Bearing Restrictions Per Provider Order: No      Mobility  Bed Mobility Overal bed mobility: Independent                  Transfers Overall transfer level: Independent                      Ambulation/Gait Ambulation/Gait assistance: Independent Gait Distance (Feet): 150 Feet Assistive device: None Gait Pattern/deviations: WFL(Within Functional Limits) Gait velocity: normal     General Gait Details: grossly WFL with good return for ambulating in room, hallways without loss of balance  Stairs            Wheelchair Mobility     Tilt Bed    Modified Rankin (Stroke Patients Only)       Balance Overall balance assessment: Independent  Pertinent Vitals/Pain Pain Assessment Pain Assessment: No/denies pain    Home Living Family/patient expects to be discharged to:: Private residence Living Arrangements: Alone Available Help at Discharge: Family;Available PRN/intermittently Type of Home: House Home Access: Stairs to enter Entrance Stairs-Rails: Left;Right;Can reach both Entrance Stairs-Number of Steps: 3 Alternate Level Stairs-Number of Steps: 10-15 Home Layout: Laundry or work area in basement;Two level Home Equipment: None      Prior Function Prior Level of Function : Independent/Modified Independent             Mobility Comments: community ambulator without AD ADLs Comments: Independent; drives     Extremity/Trunk Assessment   Upper  Extremity Assessment Upper Extremity Assessment: Defer to OT evaluation    Lower Extremity Assessment Lower Extremity Assessment: Overall WFL for tasks assessed    Cervical / Trunk Assessment Cervical / Trunk Assessment: Normal  Communication   Communication Communication: No apparent difficulties    Cognition Arousal: Alert Behavior During Therapy: WFL for tasks assessed/performed   PT - Cognitive impairments: No apparent impairments                         Following commands: Intact       Cueing Cueing Techniques: Verbal cues     General Comments      Exercises     Assessment/Plan    PT Assessment Patient does not need any further PT services  PT Problem List         PT Treatment Interventions      PT Goals (Current goals can be found in the Care Plan section)  Acute Rehab PT Goals Patient Stated Goal: return home PT Goal Formulation: With patient Time For Goal Achievement: 09/21/23 Potential to Achieve Goals: Good    Frequency       Co-evaluation PT/OT/SLP Co-Evaluation/Treatment: Yes Reason for Co-Treatment: To address functional/ADL transfers PT goals addressed during session: Mobility/safety with mobility;Balance OT goals addressed during session: ADL's and self-care       AM-PAC PT 6 Clicks Mobility  Outcome Measure Help needed turning from your back to your side while in a flat bed without using bedrails?: None Help needed moving from lying on your back to sitting on the side of a flat bed without using bedrails?: None Help needed moving to and from a bed to a chair (including a wheelchair)?: None Help needed standing up from a chair using your arms (e.g., wheelchair or bedside chair)?: None Help needed to walk in hospital room?: None Help needed climbing 3-5 steps with a railing? : None 6 Click Score: 24    End of Session   Activity Tolerance: Patient tolerated treatment well Patient left: in bed;with call bell/phone  within reach Nurse Communication: Mobility status PT Visit Diagnosis: Unsteadiness on feet (R26.81);Other abnormalities of gait and mobility (R26.89);Muscle weakness (generalized) (M62.81)    Time: 9196-9176 PT Time Calculation (min) (ACUTE ONLY): 20 min   Charges:   PT Evaluation $PT Eval Moderate Complexity: 1 Mod PT Treatments $Therapeutic Activity: 8-22 mins PT General Charges $$ ACUTE PT VISIT: 1 Visit         12:13 PM, 09/21/23 Lynwood Music, MPT Physical Therapist with [facility] [address] [phone] office [phone] mobile phone

## 2023-09-21 NOTE — Evaluation (Signed)
 Occupational Therapy Evaluation Patient Details Name: Grace [name] MRN: [id number] DOB: 11/06/1945 Today's Date: 09/21/2023   History of Present Illness   Grace [name] is a 78 y.o. female with hx of TIA, migraines, HFpEF, HTN, HLD, hypothyroidism, gastroparesis, who presents with episode of expressive aphasia, dysarthria and visual change accompanied by R temporal headache.      On chart review has had similar episodes dating back to at least 2013 which included episodes of blurred vision, balance change, and slurred speech. Ultimately was felt to have migraine with aura. While she carries a diagnosis of stroke, I do not see any imaging confirming stroke in her records.      On this admission reports similar symptoms. LKW ~ 4 PM per her report. But symptoms really worse around 7 PM, at that time was speaking to her friend over the phone and noted that she was having issues with word finding as well as slurred speech.  Her friend over the phone noted that speech was abnormal as well.  She was also having blurred vision thinks it is in both eyes but worse in the left.  Denies any homonymous visual field deficit.  Also notes that she is been having right temporal headaches which have been intermittent but recurrent over the last few days.  She is not sure how long her symptoms lasted now she feels mostly back to her normal but still feels like something is off that she cannot put her finger on.  Reports difficulties with her memory and increased latency/effort with recall. Otherwise reports decreased oral intake without other GI symptoms.  She is on mirtazapine at night. (per MD)     Clinical Impressions Pt agreeable to OT and PT co-evaluation. Pt appears to be at or near baseline function for ADL's and functional mobility. Pt able to ambulate in the hall without difficulty. Good B UE strength and ability to complete all ADL's independently. Pt is not recommended for further acute OT services and will  be discharged to the care of nursing staff for remaining length of stay.      the following:         Functional Status Assessment   Patient has not had a recent decline in their functional status     Equipment Recommendations   None recommended by OT             Precautions/Restrictions   Precautions Precautions: None Restrictions Weight Bearing Restrictions Per Provider Order: No     Mobility Bed Mobility Overal bed mobility: Independent                  Transfers Overall transfer level: Independent                        Balance Overall balance assessment: Independent                                         ADL either performed or assessed with clinical judgement   ADL Overall ADL's : Independent                                             Vision Baseline Vision/History: 1 Wears glasses Ability to See in Adequate Light: 1 Impaired  Patient Visual Report: Blurring of vision (back to normal now) Vision Assessment?: No apparent visual deficits     Perception Perception: Not tested       Praxis Praxis: Not tested       Pertinent Vitals/Pain Pain Assessment Pain Assessment: No/denies pain     Extremity/Trunk Assessment Upper Extremity Assessment Upper Extremity Assessment: Overall WFL for tasks assessed   Lower Extremity Assessment Lower Extremity Assessment: Defer to PT evaluation   Cervical / Trunk Assessment Cervical / Trunk Assessment: Normal   Communication Communication Communication: No apparent difficulties   Cognition Arousal: Alert Behavior During Therapy: WFL for tasks assessed/performed Cognition: No apparent impairments                               Following commands: Intact       Cueing  General Comments   Cueing Techniques: Verbal cues                 Home Living Family/patient expects to be discharged to:: Private residence Living  Arrangements: Alone Available Help at Discharge: Family;Available PRN/intermittently Type of Home: House Home Access: Stairs to enter [employer] of Steps: 3 Entrance Stairs-Rails: Left;Right;Can reach both Home Layout: Laundry or work area in basement;Two level Alternate Level Stairs-Number of Steps: 10-15 Alternate Level Stairs-Rails: Can reach both;Left;Right Bathroom Shower/Tub: Chief Strategy Officer: Standard Bathroom Accessibility: No   Home Equipment: None          Prior Functioning/Environment Prior Level of Function : Independent/Modified Independent             Mobility Comments: community ambulator without AD ADLs Comments: Independent; drives                            Co-evaluation PT/OT/SLP Co-Evaluation/Treatment: Yes Reason for Co-Treatment: To address functional/ADL transfers   OT goals addressed during session: ADL's and self-care      AM-PAC OT 6 Clicks Daily Activity     Outcome Measure Help from another person eating meals?: None Help from another person taking care of personal grooming?: None Help from another person toileting, which includes using toliet, bedpan, or urinal?: None Help from another person bathing (including washing, rinsing, drying)?: None Help from another person to put on and taking off regular upper body clothing?: None Help from another person to put on and taking off regular lower body clothing?: None 6 Click Score: 24   End of Session    Activity Tolerance: Patient tolerated treatment well Patient left: Other (comment) (standing in the room)  OT Visit Diagnosis: Other symptoms and signs involving the nervous system (M70.101)                Time: 9184-9173 OT Time Calculation (min): 11 min Charges:  OT General Charges $OT Visit: 1 Visit OT Evaluation $OT Eval Low Complexity: 1 Low  Aditi Rovira OT, MOT  Jayson Person 09/21/2023, 9:19 AM

## 2023-09-21 NOTE — Evaluation (Signed)
 Speech Language Pathology Evaluation Patient Details Name: Grace [name] MRN: [id number] DOB: 03-Feb-1946 Today's Date: 09/21/2023 Time: 8949-8877 SLP Time Calculation (min) (ACUTE ONLY): 32 min  Problem List:  Patient Active Problem List   Diagnosis Date Noted   Migraine 09/21/2023   Stroke-like symptom 09/20/2023   Black stools 06/23/2023   Nausea without vomiting 06/23/2023   Senile osteoporosis 12/10/2022   Bronchitis 09/09/2021   Memory loss 08/06/2021   Temporal headache 07/15/2021   Palpitations 07/15/2021   Anxiety 07/15/2021   Gastroparesis 10/18/2020   Statin myopathy 03/10/2019   Mallet deformity of right little finger 06/22/2016   Osteoarthritis of finger of right hand 06/20/2016   History of colonic polyps 11/26/2015   Genital herpes simplex type 2 05/14/2015   Hyperlipemia 01/15/2015   Pernicious anemia 11/04/2013   Other fatigue 09/19/2013   Osteoporosis 01/25/2013   GERD (gastroesophageal reflux disease) 11/16/2012   Irritable bowel syndrome 11/16/2012   Diastolic dysfunction 02/20/2012   TIA (transient ischemic attack) 02/19/2012   Dyspnea on exertion 11/28/2010   Hypothyroidism 11/28/2010   Hypercholesterolemia 11/28/2010   Past Medical History:  Past Medical History:  Diagnosis Date   Dry eyes, bilateral    Dyspnea    Frequency of urination    Heart murmur    Hyperlipidemia    Hypothyroid    Migraine    Mitral valve prolapse    Skin cancer    Stroke [facility])    TIA (transient ischemic attack) 02/19/2012   Past Surgical History:  Past Surgical History:  Procedure Laterality Date   BALLOON DILATION N/A 07/28/2013   Procedure: BALLOON DILATION;  Surgeon: Claudis RAYMOND Rivet, MD;  [facility];  Service: Endoscopy;  Laterality: N/A;   BIOPSY  02/16/2020   Procedure: BIOPSY;  Surgeon: Rivet Claudis RAYMOND, MD;  [facility];  Service: Endoscopy;;   BIOPSY  12/30/2022   Procedure: BIOPSY;  Surgeon: Eartha Angelia Sieving, MD;   [facility];  Service: Gastroenterology;;   BREAST BIOPSY Left 04/09/2022   BENIGN BREAST PARENCHYMA WITH FIBROADENOMATOID CHANGES AND CALCIFICATION   BREAST EXCISIONAL BIOPSY Left    benign-pt unsure when   CATARACT EXTRACTION W/PHACO Right 07/11/2013   Procedure: CATARACT EXTRACTION PHACO AND INTRAOCULAR LENS PLACEMENT (IOC);  Surgeon: Cherene Mania, MD;  Location: [facility];  Service: Ophthalmology;  Laterality: Right;  CDE 9.99   CATARACT EXTRACTION W/PHACO Left 08/04/2013   Procedure: CATARACT EXTRACTION PHACO AND INTRAOCULAR LENS PLACEMENT (IOC);  Surgeon: Cherene Mania, MD;  Location: [facility];  Service: Ophthalmology;  Laterality: Left;  CDE:16.26   CHOLECYSTECTOMY     COLONOSCOPY  12/18/2010   Procedure: COLONOSCOPY;  Surgeon: Claudis RAYMOND Rivet, MD;  [facility];  Service: Endoscopy;  Laterality: N/A;  1:00 pm   COLONOSCOPY N/A 03/06/2016   Procedure: COLONOSCOPY;  Surgeon: Claudis RAYMOND Rivet, MD;  [facility];  Service: Endoscopy;  Laterality: N/A;  1200   COLONOSCOPY N/A 04/18/2021   Procedure: COLONOSCOPY;  Surgeon: Rivet Claudis RAYMOND, MD;  [facility];  Service: Endoscopy;  Laterality: N/A;  805   ESOPHAGEAL DILATION N/A 01/27/2018   Procedure: ESOPHAGEAL DILATION;  Surgeon: Rivet Claudis RAYMOND, MD;  [facility];  Service: Endoscopy;  Laterality: N/A;   ESOPHAGEAL DILATION N/A 02/16/2020   Procedure: ESOPHAGEAL DILATION;  Surgeon: Rivet Claudis RAYMOND, MD;  [facility];  Service: Endoscopy;  Laterality: N/A;   ESOPHAGOGASTRODUODENOSCOPY     ESOPHAGOGASTRODUODENOSCOPY N/A 07/28/2013   Procedure:  ESOPHAGOGASTRODUODENOSCOPY (EGD);  Surgeon: Claudis RAYMOND Rivet, MD;  [facility];  Service: Endoscopy;  Laterality: N/A;  125   ESOPHAGOGASTRODUODENOSCOPY N/A 01/27/2018   Procedure: ESOPHAGOGASTRODUODENOSCOPY (EGD);  Surgeon: Rivet Claudis RAYMOND, MD;  [facility];  Service: Endoscopy;  Laterality: N/A;  2:00-moved to 10/30 @  2:45pm per Ann   ESOPHAGOGASTRODUODENOSCOPY N/A 02/16/2020   Procedure: ESOPHAGOGASTRODUODENOSCOPY (EGD);  Surgeon: Rivet Claudis RAYMOND, MD;  [facility];  Service: Endoscopy;  Laterality: N/A;  10:00   ESOPHAGOGASTRODUODENOSCOPY (EGD) WITH PROPOFOL  N/A 12/30/2022   Procedure: ESOPHAGOGASTRODUODENOSCOPY (EGD) WITH PROPOFOL ;  Surgeon: Eartha Angelia Sieving, MD;  [facility];  Service: Gastroenterology;  Laterality: N/A;  12:45 pm, asa 3   LEFT HEART CATH AND CORONARY ANGIOGRAPHY N/A 07/17/2017   Procedure: LEFT HEART CATH AND CORONARY ANGIOGRAPHY;  Surgeon: Wonda Sharper, MD;  Location: [facility] IN[facility]ASIVE CV [facility];  Service: Cardiovascular;  Laterality: N/A;   MALONEY DILATION N/A 07/28/2013   Procedure: AGAPITO DILATION;  Surgeon: Claudis RAYMOND Rivet, MD;  [facility];  Service: Endoscopy;  Laterality: N/A;   SAVORY DILATION N/A 07/28/2013   Procedure: SAVORY DILATION;  Surgeon: Claudis RAYMOND Rivet, MD;  [facility];  Service: Endoscopy;  Laterality: N/A;   THYROIDECTOMY  11/20/2011   Procedure: THYROIDECTOMY;  Surgeon: Ida Loader, MD;  Location: [facility] OR;  Service: ENT;  Laterality: Left;  LEFT THYROID  LOBECTOMY   TUBAL LIGATION     38 yrs ago.   HPI:  Pt is a 78 y.o. female with hx of TIA, migraines, HFpEF, HTN, HLD, hypothyroidism, gastroparesis, GERD, anxiety who presented 6/22 with an episode of expressive aphasia, dysarthria, and visual change accompanied by R temporal headache. H&P reports similar episodes dating back to 2013 that were diagnosed as migraine with aura. ST eval ordered as pt endorses difficulties with her memory and increased latency/effort with recall.   Assessment / Plan / Recommendation Clinical Impression  Pt seen for SLE with direct handoff from MD. Pt endorses increased STM deficits over the past 3-4 months, which she states corresponds with an increase in stress due to a recent breakup. Pt's chart notes memory loss since 2023. She  states that she often feels overwhelmed and that she's had a decrease in activity level as well. Reports a family hx of ALZ (mother). She manages her own meds, appointments, and other iADLs at baseline. Her highest level of education is 12th grade.   The SLUMS was administered with a score of 19/30 (WNL = 27 or higher for her education level). She was oriented x4. She immediately recalled 4/5 objects, which decreased to 0/5 with a delayed recall of 5 minutes. She generatively named 16 animals in a 1 min time constraint, which is WNL but of note, she frequently repeated animals that could not be counted. 100% acc for working memory tasks with serial backwards numbers. 100% acc for executive functioning clock drawing task. 100% acc for following 1-step commands. 50% acc for auditory comprehension task following a story. Pt presents with WNL voice, resonance, and motor speech skills but moderate cognitive-linguistic deficits in short-term memory (storage and retrieval) and sustained attention.   Recommended to pt that she seek follow up with her PCP/neurology for further evaluation of cognition, especially given mother's hx of Alzheimer's. Provided education on options for ST services for compensatory memory strategies outside of this level of care. Pt verbalized understanding. No further ST services warranted at this level of care.  SLP Assessment  SLP Recommendation/Assessment: All further Speech Language Pathology needs can be addressed in the next venue of care SLP Visit Diagnosis: Cognitive communication deficit (R41.841)     Assistance Recommended at Discharge  None  Functional Status Assessment Patient has had a recent decline in their functional status and demonstrates the ability to make significant improvements in function in a reasonable and predictable amount of time.  Frequency and Duration           SLP Evaluation Cognition  Overall Cognitive Status: History of cognitive  impairments - at baseline Arousal/Alertness: Awake/alert Orientation Level: Oriented X4 Year: 2025 Month: June Day of Week: Correct Attention: Sustained Sustained Attention: Impaired Sustained Attention Impairment: Verbal complex Memory: Impaired Memory Impairment: Storage deficit Awareness: Appears intact Problem Solving: Appears intact Safety/Judgment: Appears intact       Comprehension  Auditory Comprehension Overall Auditory Comprehension: Appears within functional limits for tasks assessed Yes/No Questions: Within Functional Limits Commands: Within Functional Limits Interfering Components: Anxiety EffectiveTechniques: Repetition Visual Recognition/Discrimination Discrimination: Not tested Reading Comprehension Reading Status: Not tested    Expression Expression Primary Mode of Expression: Verbal Verbal Expression Overall Verbal Expression: Appears within functional limits for tasks assessed Initiation: No impairment Repetition: No impairment Naming: No impairment Pragmatics: No impairment Interfering Components: Premorbid deficit Written Expression Dominant Hand: Right Written Expression: Not tested   Oral / Motor  Oral Motor/Sensory Function Overall Oral Motor/Sensory Function: Within functional limits Motor Speech Overall Motor Speech: Appears within functional limits for tasks assessed Respiration: Within functional limits Phonation: Normal Resonance: Within functional limits Articulation: Within functional limitis Intelligibility: Intelligible Motor Planning: Within functional limits Motor Speech Errors: Not applicable            Grace JONETTA Novak, MA CCC-SLP 09/21/2023, 1:41 PM

## 2023-09-23 ENCOUNTER — Other Ambulatory Visit (HOSPITAL_COMMUNITY): Payer: Self-pay | Admitting: Internal Medicine

## 2023-09-23 DIAGNOSIS — G459 Transient cerebral ischemic attack, unspecified: Secondary | ICD-10-CM

## 2023-09-30 ENCOUNTER — Ambulatory Visit (HOSPITAL_COMMUNITY)
Admission: RE | Admit: 2023-09-30 | Discharge: 2023-09-30 | Disposition: A | Discharge status: Home / Self Care | Source: Ambulatory Visit | Attending: Internal Medicine | Admitting: Internal Medicine

## 2023-09-30 DIAGNOSIS — G459 Transient cerebral ischemic attack, unspecified: Secondary | ICD-10-CM | POA: Diagnosis present

## 2023-10-29 ENCOUNTER — Ambulatory Visit: Admitting: Diagnostic Neuroimaging

## 2023-10-29 ENCOUNTER — Encounter: Payer: Self-pay | Admitting: Diagnostic Neuroimaging

## 2023-10-29 VITALS — BP 137/75 | HR 72 | Ht 63.0 in | Wt 117.0 lb

## 2023-10-29 DIAGNOSIS — R413 Other amnesia: Secondary | ICD-10-CM

## 2023-10-29 DIAGNOSIS — G43109 Migraine with aura, not intractable, without status migrainosus: Secondary | ICD-10-CM | POA: Diagnosis not present

## 2023-10-29 DIAGNOSIS — G459 Transient cerebral ischemic attack, unspecified: Secondary | ICD-10-CM | POA: Diagnosis not present

## 2023-10-29 NOTE — Progress Notes (Signed)
 [facility]  PATIENT: Grace [name] DOB: [birth year]  REFERRING CLINICIAN: GORMAN Fielding, MD HISTORY FROM: patient  REASON FOR VISIT: new consult    HISTORICAL  CHIEF COMPLAINT:  Chief Complaint  Patient presents with   Transient Ischemic Attack    Rm 7 with Friends  Pt is well and stable. Reports no residual TIA/stroke like concerns.     HISTORY OF PRESENT ILLNESS:   UPDATE (10/29/23, VRP): Since last visit, doing well until June 2025. Then new event of slurred speech, aphasia, headaches, x 4 hours. Went to ER and admitted for TIA workup. MRI negative for stroke.  Continues with mild memory loss, but no major changes in ADLs, except some difficulty with change (such as changing her bank account). Other ADLs intact.   UPDATE (12/11/20, VRP): Since last visit, more anxiety, memory loss, TIA events, decreased energy. Higher anxiety state, depression, fatigue. Also her close friend died in 2022-01-08from covid / afib.  PRIOR HPI (03/17/17): 78 year old female here for evaluation of abnormal spells.  Patient has history of hypercholesterolemia.  November 2013 patient had admission for possible TIA.  She had blurred vision, slurred speech, word finding difficulties.  Stroke workup was completed.  She was found to have left ICA intracranial stenosis on MRA, but follow-up cerebral angiogram showed no evidence of stenosis, and therefore was felt to be artifactual.  Patient was treated medically.  Patient was evaluated at [facility] neurology in June 2014, for abnormal visual changes, speech difficulty and headaches.  She was diagnosed with migraine aura without headache.  In 2018 patient had recurrence of symptoms.  She went to the hospital in November 2018 for blurred vision, slurred speech, word finding difficulties.  Symptoms lasted for 10 minutes.  Then she had a headache bilaterally which lasted for approximately 10 minutes.  She had a milder episode following that.  She was  admitted to the hospital for TIA evaluation.  She was found to have similar left ICA cavernous carotid narrowing on MRA, in the area of atherosclerotic calcification on CT.  Stroke workup was completed.  Patient was treated medically.  Since that time patient is doing well.  No further recurrence of symptoms.   REVIEW OF SYSTEMS: Full 14 system review of systems performed and negative with exception of: as per HPI.    ALLERGIES: Allergies  Allergen Reactions   Compazine Other (See Comments)    Slurred speech   Prochlorperazine Other (See Comments)    Unknown reaction   Statins Other (See Comments)    myalgias   Sulfa Drugs Cross Reactors Rash    HOME MEDICATIONS: Outpatient Medications Prior to Visit  Medication Sig Dispense Refill   clopidogrel  (PLAVIX ) 75 MG tablet Take 1 tablet (75 mg total) by mouth daily. 30 tablet 2   ezetimibe  (ZETIA ) 10 MG tablet TAKE ONE TABLET BY MOUTH ONCE DAILY. 90 tablet 1   levothyroxine  (SYNTHROID ) 75 MCG tablet TAKE 1 TABLET TUESDAY TO   SUNDAY AND 1/2 TABLET ON   MONDAY 88 tablet 1   mirtazapine  (REMERON ) 15 MG tablet Take 1 tablet (15 mg total) by mouth at bedtime. 30 tablet 2   Multiple Vitamins-Minerals (ALIVE WOMENS 50+ PO) Take 1 tablet by mouth daily.     pantoprazole  (PROTONIX ) 40 MG tablet Take 1 tablet (40 mg total) by mouth daily. 30 tablet 1   ALPRAZolam  (XANAX ) 0.25 MG tablet Take 1/2-1 tablet po BID prn anxiety. CAUTION:DROWSINESS (Patient not taking: Reported on 10/29/2023) 20 tablet 0  cyanocobalamin  1000 MCG tablet Take 1 tablet (1,000 mcg total) by mouth daily. (Patient not taking: Reported on 10/29/2023) 30 tablet 2   No facility-administered medications prior to visit.    PAST MEDICAL HISTORY: Past Medical History:  Diagnosis Date   Dry eyes, bilateral    Dyspnea    Frequency of urination    Heart murmur    Hyperlipidemia    Hypothyroid    Migraine    Mitral valve prolapse    Skin cancer    Stroke [facility])    TIA  (transient ischemic attack) 02/19/2012    PAST SURGICAL HISTORY: Past Surgical History:  Procedure Laterality Date   BALLOON DILATION N/A 07/28/2013   Procedure: BALLOON DILATION;  Surgeon: Claudis RAYMOND Rivet, MD;  [facility];  Service: Endoscopy;  Laterality: N/A;   BIOPSY  02/16/2020   Procedure: BIOPSY;  Surgeon: Rivet Claudis RAYMOND, MD;  [facility];  Service: Endoscopy;;   BIOPSY  12/30/2022   Procedure: BIOPSY;  Surgeon: Eartha Angelia Sieving, MD;  [facility];  Service: Gastroenterology;;   BREAST BIOPSY Left 04/09/2022   BENIGN BREAST PARENCHYMA WITH FIBROADENOMATOID CHANGES AND CALCIFICATION   BREAST EXCISIONAL BIOPSY Left    benign-pt unsure when   CATARACT EXTRACTION W/PHACO Right 07/11/2013   Procedure: CATARACT EXTRACTION PHACO AND INTRAOCULAR LENS PLACEMENT (IOC);  Surgeon: Cherene Mania, MD;  Location: [facility];  Service: Ophthalmology;  Laterality: Right;  CDE 9.99   CATARACT EXTRACTION W/PHACO Left 08/04/2013   Procedure: CATARACT EXTRACTION PHACO AND INTRAOCULAR LENS PLACEMENT (IOC);  Surgeon: Cherene Mania, MD;  Location: [facility];  Service: Ophthalmology;  Laterality: Left;  CDE:16.26   CHOLECYSTECTOMY     COLONOSCOPY  12/18/2010   Procedure: COLONOSCOPY;  Surgeon: Claudis RAYMOND Rivet, MD;  [facility];  Service: Endoscopy;  Laterality: N/A;  1:00 pm   COLONOSCOPY N/A 03/06/2016   Procedure: COLONOSCOPY;  Surgeon: Claudis RAYMOND Rivet, MD;  [facility];  Service: Endoscopy;  Laterality: N/A;  1200   COLONOSCOPY N/A 04/18/2021   Procedure: COLONOSCOPY;  Surgeon: Rivet Claudis RAYMOND, MD;  [facility];  Service: Endoscopy;  Laterality: N/A;  805   ESOPHAGEAL DILATION N/A 01/27/2018   Procedure: ESOPHAGEAL DILATION;  Surgeon: Rivet Claudis RAYMOND, MD;  [facility];  Service: Endoscopy;  Laterality: N/A;   ESOPHAGEAL DILATION N/A 02/16/2020   Procedure: ESOPHAGEAL DILATION;  Surgeon: Rivet Claudis RAYMOND, MD;  [facility]  [facility];  Service: Endoscopy;  Laterality: N/A;   ESOPHAGOGASTRODUODENOSCOPY     ESOPHAGOGASTRODUODENOSCOPY N/A 07/28/2013   Procedure: ESOPHAGOGASTRODUODENOSCOPY (EGD);  Surgeon: Claudis RAYMOND Rivet, MD;  [facility];  Service: Endoscopy;  Laterality: N/A;  125   ESOPHAGOGASTRODUODENOSCOPY N/A 01/27/2018   Procedure: ESOPHAGOGASTRODUODENOSCOPY (EGD);  Surgeon: Rivet Claudis RAYMOND, MD;  [facility];  Service: Endoscopy;  Laterality: N/A;  2:00-moved to 10/30 @ 2:45pm per Ann   ESOPHAGOGASTRODUODENOSCOPY N/A 02/16/2020   Procedure: ESOPHAGOGASTRODUODENOSCOPY (EGD);  Surgeon: Rivet Claudis RAYMOND, MD;  [facility];  Service: Endoscopy;  Laterality: N/A;  10:00   ESOPHAGOGASTRODUODENOSCOPY (EGD) WITH PROPOFOL  N/A 12/30/2022   Procedure: ESOPHAGOGASTRODUODENOSCOPY (EGD) WITH PROPOFOL ;  Surgeon: Eartha Angelia Sieving, MD;  [facility];  Service: Gastroenterology;  Laterality: N/A;  12:45 pm, asa 3   LEFT HEART CATH AND CORONARY ANGIOGRAPHY N/A 07/17/2017   Procedure: LEFT HEART CATH AND CORONARY ANGIOGRAPHY;  Surgeon: Wonda Sharper, MD;  Location: [facility] INVASIVE CV LAB;  Service: Cardiovascular;  Laterality: N/A;   MALONEY DILATION N/A 07/28/2013   Procedure: AGAPITO DILATION;  Surgeon: Claudis RAYMOND Rivet, MD;  [facility];  Service: Endoscopy;  Laterality: N/A;   SAVORY DILATION N/A 07/28/2013   Procedure: SAVORY DILATION;  Surgeon: Claudis RAYMOND Rivet, MD;  [facility];  Service: Endoscopy;  Laterality: N/A;   THYROIDECTOMY  11/20/2011   Procedure: THYROIDECTOMY;  Surgeon: Ida Loader, MD;  Location: [facility] OR;  Service: ENT;  Laterality: Left;  LEFT THYROID  LOBECTOMY   TUBAL LIGATION     39 yrs ago.    FAMILY HISTORY: Family History  Problem Relation Age of Onset   Heart disease Mother    Hypertension Mother    Stroke Mother    Thyroid  disease Mother    Dementia Mother    Stroke Maternal Grandmother    Cancer Paternal Grandmother         breast   Colon cancer Neg Hx     SOCIAL HISTORY:  Social History   Socioeconomic History   Marital status: Widowed    Spouse name: Not on file   Number of children: 2   Years of education: Not on file   Highest education level: High school graduate  Occupational History   Not on file  Tobacco Use   Smoking status: Never   Smokeless tobacco: Never  Vaping Use   Vaping status: Never Used  Substance and Sexual Activity   Alcohol use: No    Alcohol/week: 0.0 standard drinks of alcohol   Drug use: No   Sexual activity: Not Currently  Other Topics Concern   Not on file  Social History Narrative   12/11/20 lives alone long term boyfriend passed away in 2020-04-06  caffeine- one soda daily, coffee, 1/2 cup   2 children   High school   3 grandchildren   Social Drivers of Health   Financial Resource Strain: Low Risk  (01/15/2021)   Overall Financial Resource Strain (CARDIA)    Difficulty of Paying Living Expenses: Not hard at all  Food Insecurity: No Food Insecurity (09/20/2023)   Hunger Vital Sign    Worried About Running Out of Food in the Last Year: Never true    Ran Out of Food in the Last Year: Never true  Transportation Needs: No Transportation Needs (09/20/2023)   PRAPARE - Administrator, Civil Service (Medical): No    Lack of Transportation (Non-Medical): No  Physical Activity: Insufficiently Active (01/15/2021)   Exercise Vital Sign    Days of Exercise per Week: 3 days    Minutes of Exercise per Session: 20 min  Stress: No Stress Concern Present (01/15/2021)   [employer] of Occupational Health - Occupational Stress Questionnaire    Feeling of Stress : Only a little  Social Connections: Moderately Integrated (09/20/2023)   Social Connection and Isolation Panel    Frequency of Communication with Friends and Family: More than three times a week    Frequency of Social Gatherings with Friends and Family: More than three times a week     Attends Religious Services: 1 to 4 times per year    Active Member of [employer] or Organizations: Yes    Attends Banker Meetings: 1 to 4 times per year    Marital Status: Widowed  Intimate Partner Violence: Not At Risk (09/20/2023)   Humiliation, Afraid, Rape, and Kick questionnaire    Fear of Current or Ex-Partner: No    Emotionally Abused: No  Physically Abused: No    Sexually Abused: No     PHYSICAL EXAM  GENERAL EXAM/CONSTITUTIONAL: Vitals:  Vitals:   10/29/23 1428  BP: 137/75  Pulse: 72  Weight: 117 lb (53.1 kg)  Height: 5' 3 (1.6 m)   Body mass index is 20.73 kg/m. No results found.  Patient is in no distress; well developed, nourished and groomed; neck is supple  CARDIOVASCULAR: Examination of carotid arteries is normal; no carotid bruits Regular rate and rhythm, no murmurs Examination of peripheral vascular system by observation and palpation is normal  EYES: Ophthalmoscopic exam of optic discs and posterior segments is normal; no papilledema or hemorrhages  MUSCULOSKELETAL: Gait, strength, tone, movements noted in Neurologic exam below  NEUROLOGIC: MENTAL STATUS:     12/11/2020   11:55 AM  MMSE - Mini Mental State Exam  Orientation to time 5  Orientation to Place 5  Registration 3  Attention/ Calculation 2  Recall 1  Language- name 2 objects 2  Language- repeat 0  Language- follow 3 step command 3  Language- read & follow direction 1  Write a sentence 1  Copy design 0  Total score 23   awake, alert, oriented to person, place and time recent and remote memory intact normal attention and concentration language fluent, comprehension intact, naming intact,  fund of knowledge appropriate  CRANIAL NERVE:  2nd - no papilledema on fundoscopic exam 2nd, 3rd, 4th, 6th - pupils equal and reactive to light, visual fields full to confrontation, extraocular muscles intact, no nystagmus 5th - facial sensation symmetric 7th - facial strength  symmetric 8th - hearing intact 9th - palate elevates symmetrically, uvula midline 11th - shoulder shrug symmetric 12th - tongue protrusion midline  MOTOR:  normal bulk and tone, full strength in the BUE, BLE  SENSORY:  normal and symmetric to light touch, pinprick, temperature, vibration  COORDINATION:  finger-nose-finger, fine finger movements normal  REFLEXES:  deep tendon reflexes present and symmetric  GAIT/STATION:  narrow based gait; able to walk tandem; romberg is negative    DIAGNOSTIC DATA (LABS, IMAGING, TESTING) - I reviewed patient records, labs, notes, testing and imaging myself where available.  Lab Results  Component Value Date   WBC 7.4 09/20/2023   HGB 14.0 09/20/2023   HCT 40.3 09/20/2023   MCV 89.8 09/20/2023   PLT 221 09/20/2023      Component Value Date/Time   NA 134 (L) 09/20/2023 2033   NA 136 11/07/2022 1434   K 3.8 09/20/2023 2033   CL 97 (L) 09/20/2023 2033   CO2 27 09/20/2023 2033   GLUCOSE 96 09/20/2023 2033   BUN 8 09/20/2023 2033   BUN 6 (L) 11/07/2022 1434   CREATININE 0.69 09/20/2023 2033   CREATININE 0.68 01/20/2019 0946   CALCIUM  9.0 09/20/2023 2033   PROT 7.0 09/20/2023 2033   PROT 6.9 09/23/2021 1604   ALBUMIN 4.0 09/20/2023 2033   ALBUMIN 4.3 09/23/2021 1604   AST 26 09/20/2023 2033   ALT 17 09/20/2023 2033   ALKPHOS 66 09/20/2023 2033   BILITOT 0.8 09/20/2023 2033   BILITOT 0.7 09/23/2021 1604   GFRNONAA >60 09/20/2023 2033   GFRNONAA 87 01/20/2019 0946   GFRAA >60 08/24/2019 1604   GFRAA 101 01/20/2019 0946   Lab Results  Component Value Date   CHOL 125 09/21/2023   HDL 41 09/21/2023   LDLCALC 72 09/21/2023   TRIG 59 09/21/2023   CHOLHDL 3.0 09/21/2023   Lab Results  Component Value Date  HGBA1C 5.0 09/21/2023   Lab Results  Component Value Date   VITAMINB12 275 11/17/2018   Lab Results  Component Value Date   TSH 1.666 09/20/2023    02/20/12 MRI brain  - No acute or focal intracranial  abnormality.  Minimal white matter disease is stable from 2010.  02/20/12 MRA head  - 75-90% of stenosis left ICA cavernous/supraclinoid junction. - Mild intracranial atherosclerotic changes as described without proximal MCA lesion.  03/08/12 cerebral angiogram 1.  Angiographically no evidence of occlusions, stenosis, dissections, aneurysms or of arteriovenous shunting 2.  Venous outflow within normal limits.   02/09/17 MRI brain - No acute finding. Unremarkable study for age that is stable from 2013.   02/09/17 MRA head  1.  No acute finding or branch occlusion. 2. Mild to moderate narrowing at the anterior genu left cavernous carotid where there is atherosclerotic calcification by CT.  02/10/17 TTE - Left ventricle: The cavity size was normal. Wall thickness was   increased in a pattern of mild LVH. Systolic function was normal.   The estimated ejection fraction was in the range of 60% to 65%.   Wall motion was normal; there were no regional wall motion   abnormalities. Doppler parameters are consistent with abnormal   left ventricular relaxation (grade 1 diastolic dysfunction). - Aortic valve: There was trivial regurgitation. - Atrial septum: No defect or patent foramen ovale was identified.  09/20/23 MRI brain  1. No acute intracranial abnormality. 2. Mild chronic small vessel ischemic disease.  09/20/23 Normal CTA of the head and neck.  09/21/23 TTE  1. Left ventricular ejection fraction, by estimation, is 60 to 65%. The  left ventricle has normal function. The left ventricle has no regional  wall motion abnormalities. Left ventricular diastolic parameters are  indeterminate.   2. Right ventricular systolic function is normal. The right ventricular  size is normal. Tricuspid regurgitation signal is inadequate for assessing  PA pressure.   3. The mitral valve is normal in structure. No evidence of mitral valve  regurgitation. No evidence of mitral stenosis.   4. The aortic  valve is tricuspid. Aortic valve regurgitation is mild to  moderate. No aortic stenosis is present.   5. The inferior vena cava is normal in size with greater than 50%  respiratory variability, suggesting right atrial pressure of 3 mmHg.   6. Agitated saline contrast bubble study was negative, with no evidence  of any interatrial shunt.      ASSESSMENT AND PLAN  78 y.o. year old female here with:   Ddx: complicated migraine vs TIA  1. Complicated migraine   2. TIA (transient ischemic attack)   3. Memory loss     PLAN:  TIA vs complicated migraine (June 2025; speech difficulty x 4 hours; HA) - continue clopidogrel  75mg , zetia  - monitor migraine HA and sxs for now (infrequent for now; treat conservatively)  MEMORY LOSS ([facility]CI; stable MMSE 23/30; also with some anxiety, depression, GI issues; no major changes in ADLs) - consider psychology /psychiatry evaluation - try to stay active physically and get some exercise (at least 15-30 minutes per day) - eat a nutritious diet with lean protein, plants / vegetables, whole grains; avoid ultra-processed foods - increase social activities, brain stimulation, games, puzzles, hobbies, crafts, arts, music; try new activities; keep it fun! - aim for at least 7-8 hours sleep per night (or more) - avoid smoking and alcohol - caution with medications, finances, driving - safety / supervision issues reviewed  Return  for return to PCP, pending if symptoms worsen or fail to improve.    EDUARD FABIENE HANLON, MD 10/29/2023, 3:30 PM Certified in Neurology, Neurophysiology and Neuroimaging  [facility] [address] [place], [place] [address] [phone]

## 2023-11-02 ENCOUNTER — Encounter (HOSPITAL_COMMUNITY): Payer: Self-pay | Admitting: Emergency Medicine

## 2023-11-02 ENCOUNTER — Emergency Department (HOSPITAL_COMMUNITY)

## 2023-11-02 ENCOUNTER — Ambulatory Visit: Admission: EM | Admit: 2023-11-02 | Discharge: 2023-11-02 | Disposition: A | Discharge status: Home / Self Care

## 2023-11-02 ENCOUNTER — Other Ambulatory Visit: Payer: Self-pay

## 2023-11-02 ENCOUNTER — Emergency Department (HOSPITAL_COMMUNITY)
Admission: EM | Admit: 2023-11-02 | Discharge: 2023-11-02 | Disposition: A | Discharge status: Home / Self Care | Source: Ambulatory Visit | Attending: Emergency Medicine | Admitting: Emergency Medicine

## 2023-11-02 DIAGNOSIS — L819 Disorder of pigmentation, unspecified: Secondary | ICD-10-CM

## 2023-11-02 DIAGNOSIS — R209 Unspecified disturbances of skin sensation: Secondary | ICD-10-CM | POA: Diagnosis not present

## 2023-11-02 DIAGNOSIS — M79676 Pain in unspecified toe(s): Secondary | ICD-10-CM | POA: Diagnosis present

## 2023-11-02 DIAGNOSIS — L818 Other specified disorders of pigmentation: Secondary | ICD-10-CM | POA: Insufficient documentation

## 2023-11-02 LAB — CBC WITH DIFFERENTIAL/PLATELET
Abs Immature Granulocytes: 0.05 K/uL (ref 0.00–0.07)
Basophils Absolute: 0.1 K/uL (ref 0.0–0.1)
Basophils Relative: 1 %
Eosinophils Absolute: 0.1 K/uL (ref 0.0–0.5)
Eosinophils Relative: 1 %
HCT: 44.4 % (ref 36.0–46.0)
Hemoglobin: 15.5 g/dL — ABNORMAL HIGH (ref 12.0–15.0)
Immature Granulocytes: 1 %
Lymphocytes Relative: 19 %
Lymphs Abs: 1.3 K/uL (ref 0.7–4.0)
MCH: 31.3 pg (ref 26.0–34.0)
MCHC: 34.9 g/dL (ref 30.0–36.0)
MCV: 89.5 fL (ref 80.0–100.0)
Monocytes Absolute: 0.6 K/uL (ref 0.1–1.0)
Monocytes Relative: 8 %
Neutro Abs: 4.6 K/uL (ref 1.7–7.7)
Neutrophils Relative %: 70 %
Platelets: 215 K/uL (ref 150–400)
RBC: 4.96 MIL/uL (ref 3.87–5.11)
RDW: 12.4 % (ref 11.5–15.5)
WBC: 6.6 K/uL (ref 4.0–10.5)
nRBC: 0 % (ref 0.0–0.2)

## 2023-11-02 LAB — COMPREHENSIVE METABOLIC PANEL WITH GFR
ALT: 18 U/L (ref 0–44)
AST: 26 U/L (ref 15–41)
Albumin: 4.4 g/dL (ref 3.5–5.0)
Alkaline Phosphatase: 64 U/L (ref 38–126)
Anion gap: 11 (ref 5–15)
BUN: 9 mg/dL (ref 8–23)
CO2: 25 mmol/L (ref 22–32)
Calcium: 9.3 mg/dL (ref 8.9–10.3)
Chloride: 95 mmol/L — ABNORMAL LOW (ref 98–111)
Creatinine, Ser: 0.67 mg/dL (ref 0.44–1.00)
GFR, Estimated: >60 mL/min (ref >60)
Glucose, Bld: 106 mg/dL — ABNORMAL HIGH (ref 70–99)
Potassium: 4.2 mmol/L (ref 3.5–5.1)
Sodium: 131 mmol/L — ABNORMAL LOW (ref 135–145)
Total Bilirubin: 1.1 mg/dL (ref 0.0–1.2)
Total Protein: 7.9 g/dL (ref 6.5–8.1)

## 2023-11-02 MED ORDER — IOHEXOL 350 MG/ML SOLN
125.0000 mL | Freq: Once | INTRAVENOUS | Status: AC | PRN
  Administered 2023-11-02: 125 mL via INTRAVENOUS

## 2023-11-02 NOTE — Discharge Instructions (Addendum)
 Your CT scan did not show any loss of blood flow to your feet.  No signs of blood clots.  Please continue taking your Plavix  as prescribed  Please follow-up with the vascular doctor (Dr. Pearline).  He is going to have his office call you to schedule an appointment.  However, if you do not hear from his office within the next 2 days, please contact them for an appointment.  His contact information is listed below.  Your sodium level was slightly low here today.  Please follow-up with your PCP for recheck and further management within the next 2 weeks.  Return to the ER for numbness in your foot, severe pain,  any other new or concerning symptoms

## 2023-11-02 NOTE — Discharge Instructions (Signed)
 I recommend following up with primary care and/or the vascular specialist as soon as possible for further evaluation into the symptoms that you are currently having.  Wear socks to keep your feet as warm as possible, elevate your legs at rest and drink plenty of water 

## 2023-11-02 NOTE — ED Provider Notes (Signed)
 [facility] EMERGENCY DEPARTMENT AT [facility] Provider Note   CSN: [id number] Arrival date & time: 11/02/23  9142     Patient presents with: Toe Pain   Grace [name] is a 78 y.o. female with history of TIA recently started on Plavix , presents with concern for discoloration in the toes of the feet bilaterally.  States that the discoloration started with a reddish hue in her toes about a week ago.  It then started to spread and she noticed more blue hue.  This is not painful.  She reports her toes feel slightly more cold.  Denies any numbness in the toes.  Denies any injuries to her feet.    Toe Pain       Prior to Admission medications   Medication Sig Start Date End Date Taking? Authorizing Provider  clopidogrel  (PLAVIX ) 75 MG tablet Take 1 tablet (75 mg total) by mouth daily. 09/22/23   Ricky Fines, MD  ezetimibe  (ZETIA ) 10 MG tablet TAKE ONE TABLET BY MOUTH ONCE DAILY. 07/01/21   Alphonsa Glendia LABOR, MD  levothyroxine  (SYNTHROID ) 75 MCG tablet TAKE 1 TABLET TUESDAY TO   SUNDAY AND 1/2 TABLET ON   MONDAY 08/06/21   Alphonsa Glendia LABOR, MD  mirtazapine  (REMERON ) 15 MG tablet Take 1 tablet (15 mg total) by mouth at bedtime. 09/21/23   Ricky Fines, MD  Multiple Vitamins-Minerals (ALIVE WOMENS 50+ PO) Take 1 tablet by mouth daily.    [provider]  pantoprazole  (PROTONIX ) 40 MG tablet Take 1 tablet (40 mg total) by mouth daily. 09/22/23   Ricky Fines, MD    Allergies: Compazine, Prochlorperazine, Sulfamethoxazole-trimethoprim, Statins, and Sulfa drugs cross reactors    Review of Systems  Skin:  Positive for color change.    Updated Vital Signs BP 131/61   Pulse 73   Temp 97.8 F (36.6 C) (Oral)   Resp 16   Ht 5' 3 (1.6 m)   Wt 53.1 kg   SpO2 98%   BMI 20.73 kg/m   Physical Exam Vitals and nursing note reviewed.  Constitutional:      Appearance: Normal appearance.  HENT:     Head: Atraumatic.  Cardiovascular:     Comments: 2+ pedal pulses  bilaterally Pulmonary:     Effort: Pulmonary effort is normal.  Musculoskeletal:     Comments: Lower extremity:  General Blue hue to the base of the 2nd and 3rd toe of the left foot, reddish hue to the left fourth toe. Blue hue to the skin of the right 3rd and 4th toe, reddish discoloration to the right 5th toe  Palpation Nontender of the 1st through 5th phalanges of the right or left foot  ROM Full ankle flexion, extension, inversion and eversion bilaterally.  Able to wiggle all toes of the left and right foot   Neurological:     General: No focal deficit present.     Mental Status: She is alert.     Comments: Sensation intact in the 1st through 5th toes of the right and left foot  Psychiatric:        Mood and Affect: Mood normal.        Behavior: Behavior normal.        (all labs ordered are listed, but only abnormal results are displayed) Labs Reviewed  CBC WITH DIFFERENTIAL/PLATELET - Abnormal; Notable for the following components:      Result Value   Hemoglobin 15.5 (*)    All other components within normal limits  COMPREHENSIVE METABOLIC PANEL WITH GFR - Abnormal; Notable for the following components:   Sodium 131 (*)    Chloride 95 (*)    Glucose, Bld 106 (*)    All other components within normal limits    EKG: None  Radiology: CT Angio Aortobifemoral W and/or Wo Contrast Result Date: 11/02/2023 EXAM: CTA ABDOMEN AND PELVIS WITH AND WITHOUT CONTRAST AND RUNOFF CTA OF THE LOWER EXTREMITIES WITH CONTRAST 11/02/2023 11:51:03 AM TECHNIQUE: CTA images of the abdomen, pelvis and lower extremities with and without intravenous contrast. Three-dimensional MIP/volume rendered formations were performed. Automated exposure control, iterative reconstruction, and/or weight based adjustment of the mA/kV was utilized to reduce the radiation dose to as low as reasonably achievable. COMPARISON: 03/20/2020 CLINICAL HISTORY: Claudication or leg ischemia; toe discoloration  bilaterally. Blue, redness, to right 5 toe (pinky) denies injury. FINDINGS: VASCULATURE: AORTA: Scattered calcified plaque through the abdominal aorta without aneurysm, dissection, or stenosis. CELIAC TRUNK: No acute finding. No occlusion or significant stenosis. SUPERIOR MESENTERIC ARTERY: No acute finding. No occlusion or significant stenosis. INFERIOR MESENTERIC ARTERY: No acute finding. No occlusion or significant stenosis. RENAL ARTERIES: Duplicated left renal artery, posteriorly dominant, both patent. RIGHT ILIAC ARTERIES: Right common iliac mild plaque common, no stenosis. RIGHT FEMORAL SRTERIES: No acute finding. No occlusion or significant stenosis. RIGHT POPLITEAL ARTERY: No acute finding. No occlusion or significant stenosis. RIGHT CALF ARTERIES: Contiguous right anterior tibial and peroneal runoff. The posterior tibial artery is not visualized. LEFT ILIAC ARTERIES: Mild non-occlusive plaque in the common iliac artery. LEFT FEMORAL ARTERIES: No acute finding. No occlusion or significant stenosis. LEFT POPLITEAL ARTERY: No acute finding. No occlusion or significant stenosis. LEFT CALF ARTERIES: Contiguous anterior tibial and peroneal runoff. ABDOMEN AND PELVIS: LOWER CHEST: Visualized portion of the lower chest demonstrates no acute abnormality. LIVER: The liver is unremarkable. GALLBLADDER AND BILE DUCTS: Cholecystectomy clips. SPLEEN: The spleen is unremarkable. PANCREAS: The pancreas is unremarkable. ADRENAL GLANDS: Bilateral adrenal glands demonstrate no acute abnormality. KIDNEYS, URETERS AND BLADDER: No stones in the kidneys or ureters. No hydronephrosis. No evidence of perinephric or periureteral stranding. Urinary bladder moderately distended. GI AND Bowel: Stomach and duodenal sweep demonstrate no acute abnormality. There is no bowel obstruction. No abnormal bowel wall thickening or distension. REPRODUCTIVE: Reproductive organs are unremarkable. PERITONEUM AND RETRPERITONEUM: No ascites or free  air. LYMPH NODES: No evidence of lymphadenopathy. BONES AND SOFT TISSUES: No acute abnormality of the bones. No acute soft tissue abnormality. IMPRESSION: 1. No occlusion or hemodynamically significant stenosis of the arterial system of the abdomen, pelvis, or bilateral lower extremities. 2. No AAA. No aortic dissection. Electronically signed by: Dayne Hassell MD 11/02/2023 02:54 PM EDT RP Workstation: [id number]     Procedures   Medications Ordered in the ED  iohexol  (OMNIPAQUE ) 350 MG/ML injection 125 mL (125 mLs Intravenous Contrast Given 11/02/23 1129)    Clinical Course as of 11/02/23 1554  Mon Nov 02, 2023  1537 Dr. Pearline recommends no further emergent evaluation or change in Plavix  dose.  Will have his office contact patient for follow-up appointment to perform ABI to rule out any other etiology. [AF]    Clinical Course User Index [AF] Veta Palma, PA-C                                 Medical Decision Making Amount and/or Complexity of Data Reviewed Labs: ordered. Radiology: ordered.  Risk Prescription drug management.     Differential  diagnosis includes but is not limited to vascular stenosis, DVT, bruising  ED Course:  Upon initial evaluation, patient is well-appearing, stable vitals.  Having noted a noticeable bluish hue to the toes of the bilateral feet as well as a redish hue.  No known injury, lower concern for traumatic ecchymosis.   Denies any pain associated with this.  She has intact sensation to the 1st through 5th digits of the right and left toes bilaterally.  2+ pedal pulses bilaterally.  No signs of ischemia.  Labs Ordered: I Ordered, and personally interpreted labs.  The pertinent results include:   CMP with hyponatremia 131 CBC with elevated hemoglobin of 15.5, otherwise in the normal limits  Imaging Studies ordered: I ordered imaging studies including CT angio bilateral lower extremity I independently visualized the imaging with scope of  interpretation limited to determining acute life threatening conditions related to emergency care. Imaging showed no acute abnormalities I agree with the radiologist interpretation   Consultations Obtained: I requested consultation with vascular Dr. Pearline,  and discussed lab and imaging findings as well as pertinent plan - they recommend: No further emergent workup needed.  Recommend follow-up in office with him.  He will have his office call patient to schedule appointment.  Medications Given: None  Upon re-evaluation, patient remains well-appearing.  The area of discoloration was traced earlier in the ER stay, and there has been no spread of discoloration.  Patient remains neurovascularly intact in the bilateral lower extremities.  Discussed that her CT scan was reassuring without any signs of vascular compromise to explain her discoloration.  Unclear etiology as to her toe discoloration at this time. No significant erythema, edema, no open wounds, no leukocytosis, low concern for infection or cellulitis. We discussed that Dr. Pearline would like to see her in office for further evaluation, she is in agreement with this plan of care. Stable and appropriate for discharge home    Impression: Toe discoloration bilaterally  Disposition:  The patient was discharged home with instructions to with Dr. Pearline as soon as possible for further management.  She understands to be on the look out for call.  I also provided his contact information to schedule an appointment with his office if she has not heard from them within the next 2 days.  Follow-up with PCP within the next 2 weeks regarding her hyponatremia here today. Return precautions given.    Record Review: External records from outside source obtained and reviewed including visit for her TIA on 09/20/2023 where she was started on Plavix      This chart was dictated using voice recognition software, Dragon. Despite the best efforts of this  provider to proofread and correct errors, errors may still occur which can change documentation meaning.       Final diagnoses:  Discoloration of skin of toe    ED Discharge Orders     None          Veta Palma, DEVONNA 11/02/23 1554    Suzette Pac, MD 11/04/23 1345

## 2023-11-02 NOTE — ED Triage Notes (Signed)
 Pt states she is having redness and slight busing to right pinky toe x 1 week. Started on left fourth toe x 2-3 days. No recent injury or falls. Pt states her feet feel numb and cold.

## 2023-11-02 NOTE — ED Triage Notes (Addendum)
 Pt c/o discoloration to bilateral toes x 1 week, painful to right toes, pt reports she was seen by UC today and was advised she needs an u/s

## 2023-11-02 NOTE — ED Notes (Signed)
..  The patient is A&OX4, ambulatory at d/c with independent steady gait, NAD. Pt verbalized understanding of d/c instructions and follow up care. Pt declined offer for wheelchair.

## 2023-11-03 ENCOUNTER — Telehealth: Payer: Self-pay | Admitting: Vascular Surgery

## 2023-11-03 NOTE — ED Provider Notes (Signed)
 [facility]    CSN: [id number] Arrival date & time: 11/02/23  0802      History   Chief Complaint Chief Complaint  Patient presents with   Toe Pain    HPI Grace [name] is a 78 y.o. female.   Patient presenting today with about a week or so of some redness and slight bruising type discoloration that initially was only on the right pinky toe and has spread a bit to the base of the fourth toe as well on the right foot and now a very similar distribution to the left foot.  Denies significant pain, itching, numbness, decreased range of motion, known injury to the area, fever, chills.  So far not trying anything over-the-counter for symptoms.  Does tend to keep a bit of swelling to her lower legs per patient but nothing significant and no more than usual currently.  Notes that her feet always feel a bit tingly and cold.    Past Medical History:  Diagnosis Date   Dry eyes, bilateral    Dyspnea    Frequency of urination    Heart murmur    Hyperlipidemia    Hypothyroid    Migraine    Mitral valve prolapse    Skin cancer    Stroke [facility])    TIA (transient ischemic attack) 02/19/2012    Patient Active Problem List   Diagnosis Date Noted   Migraine 09/21/2023   Stroke-like symptom 09/20/2023   Black stools 06/23/2023   Nausea without vomiting 06/23/2023   Senile osteoporosis 12/10/2022   Bronchitis 09/09/2021   Memory loss 08/06/2021   Temporal headache 07/15/2021   Palpitations 07/15/2021   Anxiety and depression 07/15/2021   Gastroparesis 10/18/2020   Statin myopathy 03/10/2019   Mallet deformity of right little finger 06/22/2016   Osteoarthritis of finger of right hand 06/20/2016   History of colonic polyps 11/26/2015   Genital herpes simplex type 2 05/14/2015   Hyperlipemia 01/15/2015   Pernicious anemia 11/04/2013   Other fatigue 09/19/2013   Osteoporosis 01/25/2013   GERD (gastroesophageal reflux disease) 11/16/2012   Irritable bowel syndrome  11/16/2012   Diastolic dysfunction 02/20/2012   TIA (transient ischemic attack) 02/19/2012   Dyspnea on exertion 11/28/2010   Hypothyroidism 11/28/2010   Hypercholesterolemia 11/28/2010    Past Surgical History:  Procedure Laterality Date   BALLOON DILATION N/A 07/28/2013   Procedure: BALLOON DILATION;  Surgeon: Claudis RAYMOND Rivet, MD;  [facility];  Service: Endoscopy;  Laterality: N/A;   BIOPSY  02/16/2020   Procedure: BIOPSY;  Surgeon: Rivet Claudis RAYMOND, MD;  Location: [facility];  Service: Endoscopy;;   BIOPSY  12/30/2022   Procedure: BIOPSY;  Surgeon: Eartha Angelia Sieving, MD;  [facility]: [facility];  Service: Gastroenterology;;   BREAST BIOPSY Left 04/09/2022   BENIGN BREAST PARENCHYMA WITH FIBROADENOMATOID CHANGES AND CALCIFICATION   BREAST EXCISIONAL BIOPSY Left    benign-pt unsure when   CATARACT EXTRACTION W/PHACO Right 07/11/2013   Procedure: CATARACT EXTRACTION PHACO AND INTRAOCULAR LENS PLACEMENT (IOC);  Surgeon: Cherene Mania, MD;  Location: [facility];  Service: Ophthalmology;  Laterality: Right;  CDE 9.99   CATARACT EXTRACTION W/PHACO Left 08/04/2013   Procedure: CATARACT EXTRACTION PHACO AND INTRAOCULAR LENS PLACEMENT (IOC);  Surgeon: Cherene Mania, MD;  Location: [facility];  Service: Ophthalmology;  Laterality: Left;  CDE:16.26   CHOLECYSTECTOMY     COLONOSCOPY  12/18/2010   Procedure: COLONOSCOPY;  Surgeon: Claudis RAYMOND Rivet, MD;  [facility]: [facility];  Service: Endoscopy;  Laterality: N/A;  1:00 pm   COLONOSCOPY N/A 03/06/2016   Procedure: COLONOSCOPY;  Surgeon: Claudis RAYMOND Rivet, MD;  [facility];  Service: Endoscopy;  Laterality: N/A;  1200   COLONOSCOPY N/A 04/18/2021   Procedure: COLONOSCOPY;  Surgeon: Rivet Claudis RAYMOND, MD;  [facility];  Service: Endoscopy;  Laterality: N/A;  805   ESOPHAGEAL DILATION N/A 01/27/2018   Procedure: ESOPHAGEAL DILATION;  Surgeon: Rivet Claudis RAYMOND, MD;  [facility];  Service: Endoscopy;   Laterality: N/A;   ESOPHAGEAL DILATION N/A 02/16/2020   Procedure: ESOPHAGEAL DILATION;  Surgeon: Rivet Claudis RAYMOND, MD;  [facility];  Service: Endoscopy;  Laterality: N/A;   ESOPHAGOGASTRODUODENOSCOPY     ESOPHAGOGASTRODUODENOSCOPY N/A 07/28/2013   Procedure: ESOPHAGOGASTRODUODENOSCOPY (EGD);  Surgeon: Claudis RAYMOND Rivet, MD;  [facility];  Service: Endoscopy;  Laterality: N/A;  125   ESOPHAGOGASTRODUODENOSCOPY N/A 01/27/2018   Procedure: ESOPHAGOGASTRODUODENOSCOPY (EGD);  Surgeon: Rivet Claudis RAYMOND, MD;  [facility];  Service: Endoscopy;  Laterality: N/A;  2:00-moved to 10/30 @ 2:45pm per Ann   ESOPHAGOGASTRODUODENOSCOPY N/A 02/16/2020   Procedure: ESOPHAGOGASTRODUODENOSCOPY (EGD);  Surgeon: Rivet Claudis RAYMOND, MD;  [facility];  Service: Endoscopy;  Laterality: N/A;  10:00   ESOPHAGOGASTRODUODENOSCOPY (EGD) WITH PROPOFOL  N/A 12/30/2022   Procedure: ESOPHAGOGASTRODUODENOSCOPY (EGD) WITH PROPOFOL ;  Surgeon: Eartha Angelia Sieving, MD;  [facility];  Service: Gastroenterology;  Laterality: N/A;  12:45 pm, asa 3   LEFT HEART CATH AND CORONARY ANGIOGRAPHY N/A 07/17/2017   Procedure: LEFT HEART CATH AND CORONARY ANGIOGRAPHY;  Surgeon: Wonda Sharper, MD;  Location: [facility];  Service: Cardiovascular;  Laterality: N/A;   MALONEY DILATION N/A 07/28/2013   Procedure: AGAPITO DILATION;  Surgeon: Claudis RAYMOND Rivet, MD;  [facility];  Service: Endoscopy;  Laterality: N/A;   SAVORY DILATION N/A 07/28/2013   Procedure: SAVORY DILATION;  Surgeon: Claudis RAYMOND Rivet, MD;  [facility];  Service: Endoscopy;  Laterality: N/A;   THYROIDECTOMY  11/20/2011   Procedure: THYROIDECTOMY;  Surgeon: Ida Loader, MD;  Location: [facility] OR;  Service: ENT;  Laterality: Left;  LEFT THYROID  LOBECTOMY   TUBAL LIGATION     39 yrs ago.    OB History   No obstetric history on file.      Home Medications    Prior to Admission medications    Medication Sig Start Date End Date Taking? Authorizing Provider  clopidogrel  (PLAVIX ) 75 MG tablet Take 1 tablet (75 mg total) by mouth daily. 09/22/23  Yes Ricky Fines, MD  ezetimibe  (ZETIA ) 10 MG tablet TAKE ONE TABLET BY MOUTH ONCE DAILY. 07/01/21  Yes Luking, Glendia LABOR, MD  levothyroxine  (SYNTHROID ) 75 MCG tablet TAKE 1 TABLET TUESDAY TO   SUNDAY AND 1/2 TABLET ON   MONDAY 08/06/21  Yes Luking, Glendia LABOR, MD  mirtazapine  (REMERON ) 15 MG tablet Take 1 tablet (15 mg total) by mouth at bedtime. 09/21/23  Yes Ricky Fines, MD  Multiple Vitamins-Minerals (ALIVE WOMENS 50+ PO) Take 1 tablet by mouth daily.   Yes [provider]  pantoprazole  (PROTONIX ) 40 MG tablet Take 1 tablet (40 mg total) by mouth daily. 09/22/23   Ricky Fines, MD    Family History Family History  Problem Relation Age of Onset   Heart disease Mother    Hypertension Mother    Stroke Mother    Thyroid  disease Mother    Dementia Mother    Stroke Maternal  Grandmother    Cancer Paternal Grandmother        breast   Colon cancer Neg Hx     Social History Social History   Tobacco Use   Smoking status: Never   Smokeless tobacco: Never  Vaping Use   Vaping status: Never Used  Substance Use Topics   Alcohol use: No    Alcohol/week: 0.0 standard drinks of alcohol   Drug use: No     Allergies   Compazine, Prochlorperazine, Sulfamethoxazole-trimethoprim, Statins, and Sulfa drugs cross reactors   Review of Systems Review of Systems Per HPI  Physical Exam Triage Vital Signs ED Triage Vitals [11/02/23 0822]  Encounter Vitals Group     BP (!) 140/71     Girls Systolic BP Percentile      Girls Diastolic BP Percentile      Boys Systolic BP Percentile      Boys Diastolic BP Percentile      Pulse Rate 83     Resp 16     Temp 97.7 F (36.5 C)     Temp Source Oral     SpO2 97 %     Weight      Height      Head Circumference      Peak Flow      Pain Score 4     Pain Loc      Pain Education       Exclude from Growth Chart    No data found.  Updated Vital Signs BP (!) 140/71 (BP Location: Right Arm)   Pulse 83   Temp 97.7 F (36.5 C) (Oral)   Resp 16   SpO2 97%   Visual Acuity Right Eye Distance:   Left Eye Distance:   Bilateral Distance:    Right Eye Near:   Left Eye Near:    Bilateral Near:     Physical Exam Vitals and nursing note reviewed.  Constitutional:      Appearance: Normal appearance. She is not ill-appearing.  HENT:     Head: Atraumatic.  Eyes:     Extraocular Movements: Extraocular movements intact.     Conjunctiva/sclera: Conjunctivae normal.  Cardiovascular:     Rate and Rhythm: Normal rate and regular rhythm.     Heart sounds: Normal heart sounds.  Pulmonary:     Effort: Pulmonary effort is normal.     Breath sounds: Normal breath sounds.  Musculoskeletal:        General: Normal range of motion.     Cervical back: Normal range of motion and neck supple.  Skin:    General: Skin is warm and dry.     Findings: Bruising and erythema present.     Comments: Slight erythema and bruising discoloration to dorsal feet bilaterally near the 4th and 5th toes, nontender to palpation, no bony deformities in the area palpable and range of motion intact  Neurological:     Mental Status: She is alert and oriented to person, place, and time.     Comments: Bilateral feet neurovascularly intact though slightly slow cap refill of about 3-4 seconds to toes diffusely  Psychiatric:        Mood and Affect: Mood normal.        Thought Content: Thought content normal.        Judgment: Judgment normal.      UC Treatments / Results  Labs (all labs ordered are listed, but only abnormal results are displayed) Labs Reviewed - No data to display  EKG   Radiology CT Angio Aortobifemoral W and/or Wo Contrast Result Date: 11/02/2023 EXAM: CTA ABDOMEN AND PELVIS WITH AND WITHOUT CONTRAST AND RUNOFF CTA OF THE LOWER EXTREMITIES WITH CONTRAST 11/02/2023 11:51:03 AM  TECHNIQUE: CTA images of the abdomen, pelvis and lower extremities with and without intravenous contrast. Three-dimensional MIP/volume rendered formations were performed. Automated exposure control, iterative reconstruction, and/or weight based adjustment of the mA/kV was utilized to reduce the radiation dose to as low as reasonably achievable. COMPARISON: 03/20/2020 CLINICAL HISTORY: Claudication or leg ischemia; toe discoloration bilaterally. Blue, redness, to right 5 toe (pinky) denies injury. FINDINGS: VASCULATURE: AORTA: Scattered calcified plaque through the abdominal aorta without aneurysm, dissection, or stenosis. CELIAC TRUNK: No acute finding. No occlusion or significant stenosis. SUPERIOR MESENTERIC ARTERY: No acute finding. No occlusion or significant stenosis. INFERIOR MESENTERIC ARTERY: No acute finding. No occlusion or significant stenosis. RENAL ARTERIES: Duplicated left renal artery, posteriorly dominant, both patent. RIGHT ILIAC ARTERIES: Right common iliac mild plaque common, no stenosis. RIGHT FEMORAL SRTERIES: No acute finding. No occlusion or significant stenosis. RIGHT POPLITEAL ARTERY: No acute finding. No occlusion or significant stenosis. RIGHT CALF ARTERIES: Contiguous right anterior tibial and peroneal runoff. The posterior tibial artery is not visualized. LEFT ILIAC ARTERIES: Mild non-occlusive plaque in the common iliac artery. LEFT FEMORAL ARTERIES: No acute finding. No occlusion or significant stenosis. LEFT POPLITEAL ARTERY: No acute finding. No occlusion or significant stenosis. LEFT CALF ARTERIES: Contiguous anterior tibial and peroneal runoff. ABDOMEN AND PELVIS: LOWER CHEST: Visualized portion of the lower chest demonstrates no acute abnormality. LIVER: The liver is unremarkable. GALLBLADDER AND BILE DUCTS: Cholecystectomy clips. SPLEEN: The spleen is unremarkable. PANCREAS: The pancreas is unremarkable. ADRENAL GLANDS: Bilateral adrenal glands demonstrate no acute abnormality.  KIDNEYS, URETERS AND BLADDER: No stones in the kidneys or ureters. No hydronephrosis. No evidence of perinephric or periureteral stranding. Urinary bladder moderately distended. GI AND Bowel: Stomach and duodenal sweep demonstrate no acute abnormality. There is no bowel obstruction. No abnormal bowel wall thickening or distension. REPRODUCTIVE: Reproductive organs are unremarkable. PERITONEUM AND RETRPERITONEUM: No ascites or free air. LYMPH NODES: No evidence of lymphadenopathy. BONES AND SOFT TISSUES: No acute abnormality of the bones. No acute soft tissue abnormality. IMPRESSION: 1. No occlusion or hemodynamically significant stenosis of the arterial system of the abdomen, pelvis, or bilateral lower extremities. 2. No AAA. No aortic dissection. Electronically signed by: Katheleen Faes MD 11/02/2023 02:54 PM EDT RP Workstation: [id number]    Procedures Procedures (including critical care time)  Medications Ordered in UC Medications - No data to display  Initial Impression / Assessment and Plan / UC Course  I have reviewed the triage vital signs and the nursing notes.  Pertinent labs & imaging results that were available during my care of the patient were reviewed by me and considered in my medical decision making (see chart for details).     Patient is very well-appearing with normal vital signs and in no acute distress.  Issue is ongoing for the past week or so.  Will forego x-ray imaging today as no known injury and very low suspicion for a bony abnormality at play.  Suspect issue is vascular in nature, possibly even a Raynaud's type issue.  Recommended PCP and vascular follow-up, information given for these appointments.  Discussed to keep feet warm, elevate legs at rest to help with any fluid retention and follow-up for worsening or unresolving symptoms.   Final Clinical Impressions(s) / UC Diagnoses   Final diagnoses:  Discoloration of skin of  toe  Cold extremities     Discharge  Instructions      I recommend following up with primary care and/or the vascular specialist as soon as possible for further evaluation into the symptoms that you are currently having.  Wear socks to keep your feet as warm as possible, elevate your legs at rest and drink plenty of water     ED Prescriptions   None    PDMP not reviewed this encounter.   Stuart Vernell Norris, [place] 11/03/23 [phone]

## 2023-11-03 NOTE — Telephone Encounter (Signed)
 Appt has been scheduled for 09/12.

## 2023-11-04 ENCOUNTER — Other Ambulatory Visit: Payer: Self-pay

## 2023-11-06 ENCOUNTER — Other Ambulatory Visit: Payer: Self-pay | Admitting: *Deleted

## 2023-11-06 DIAGNOSIS — M79606 Pain in leg, unspecified: Secondary | ICD-10-CM

## 2023-12-11 ENCOUNTER — Ambulatory Visit (HOSPITAL_COMMUNITY): Admitting: Vascular Surgery

## 2023-12-11 ENCOUNTER — Ambulatory Visit (HOSPITAL_COMMUNITY)

## 2023-12-31 ENCOUNTER — Ambulatory Visit (INDEPENDENT_AMBULATORY_CARE_PROVIDER_SITE_OTHER): Admitting: Gastroenterology

## 2023-12-31 ENCOUNTER — Encounter (INDEPENDENT_AMBULATORY_CARE_PROVIDER_SITE_OTHER): Payer: Self-pay | Admitting: Gastroenterology

## 2023-12-31 ENCOUNTER — Telehealth (INDEPENDENT_AMBULATORY_CARE_PROVIDER_SITE_OTHER): Payer: Self-pay | Admitting: Gastroenterology

## 2023-12-31 VITALS — BP 128/69 | HR 69 | Temp 96.9°F | Ht 62.0 in | Wt 118.9 lb

## 2023-12-31 DIAGNOSIS — K3184 Gastroparesis: Secondary | ICD-10-CM

## 2023-12-31 DIAGNOSIS — K581 Irritable bowel syndrome with constipation: Secondary | ICD-10-CM

## 2023-12-31 MED ORDER — PRUCALOPRIDE SUCCINATE 1 MG PO TABS: 1.0000 mg | ORAL_TABLET | Freq: Every day | ORAL | 1 refills | Status: DC

## 2023-12-31 NOTE — Progress Notes (Addendum)
 Referring Provider: Orpha Yancey LABOR, MD Primary Care Physician:  Orpha Yancey LABOR, MD Primary GI Physician: Dr. Eartha   Chief Complaint  Patient presents with   Follow-up    Pt arrives for follow up. Pt states she is still having issues with stomach. Not able to eat due to not feeling hungry.     HPI:   Grace [name] is a 78 y.o. female with past medical history of chronic constipation, gastroparesis, heart failure with diastolic dysfunction, hyperlipidemia, hypothyroidism, stroke, mitral prolapse   Patient presenting today for:  Follow up of gastroparesis and IBS with constipation   Last seen June by Dr. Eartha, at that time, reported low appetite, feeling full early. Forcing herself to eat, no weight loss. Some upper abdominal discomfort. Frequent nausea/vomiting   Recommended start domperidone 5mg  TID, miralax  1/2 capful daily  Present:  States that she has very hard, sporadic stools. She uses miralax  maybe 2-3 times per week, about 1/2 capful when she does take it. She denise taking anything for this in the past. She has some upper abdominal pain, cannot pinpoint any precipitating or alleviating factors. Endorses constant fullness, but does try to make herself eat. She reports some ongoing nausea but no vomiting. Does not take anything for her nausea. Does try to do good water  intake, fruits and veggies in her diet are low.   Recent labs in August with sodium 131, chloride 95, normal LFTs and renal function, hgb 15.5, TSH 1.666 in June   Patient states she has not been good with taking medications though she is tired of not feeling well and feeling full all the time, she is willing to try something for her symptoms   Gastric emptying study: 11/2019  12% emptied at 1 hr ( normal >= 10%) 17% emptied at 2 hr ( normal >= 40%) 21% emptied at 3 hr ( normal >= 70%) 25% emptied at 4 hr ( normal >= 90%)   IMPRESSION: Significantly delayed gastric emptying study.   Last  EGD:-12/30/2022 Normal esophagus, stomach and duodenum.  Gastric biopsies showed inactive gastritis negative for H. pylori.   Last Colonoscopy: 03/2021, performed by Dr. Golda Normal colon, external hemorrhoids.  Past Medical History:  Diagnosis Date   Dry eyes, bilateral    Dyspnea    Frequency of urination    Heart murmur    Hyperlipidemia    Hypothyroid    Migraine    Mitral valve prolapse    Skin cancer    Stroke [facility])    TIA (transient ischemic attack) 02/19/2012    Past Surgical History:  Procedure Laterality Date   BALLOON DILATION N/A 07/28/2013   Procedure: BALLOON DILATION;  Surgeon: Claudis RAYMOND Golda, MD;  [facility];  Service: Endoscopy;  Laterality: N/A;   BIOPSY  02/16/2020   Procedure: BIOPSY;  Surgeon: Golda Claudis RAYMOND, MD;  [facility];  Service: Endoscopy;;   BIOPSY  12/30/2022   Procedure: BIOPSY;  Surgeon: Eartha Angelia Sieving, MD;  [facility];  Service: Gastroenterology;;   BREAST BIOPSY Left 04/09/2022   BENIGN BREAST PARENCHYMA WITH FIBROADENOMATOID CHANGES AND CALCIFICATION   BREAST EXCISIONAL BIOPSY Left    benign-pt unsure when   CATARACT EXTRACTION W/PHACO Right 07/11/2013   Procedure: CATARACT EXTRACTION PHACO AND INTRAOCULAR LENS PLACEMENT (IOC);  Surgeon: Cherene Mania, MD;  Location: [facility];  Service: Ophthalmology;  Laterality: Right;  CDE 9.99   CATARACT EXTRACTION W/PHACO Left 08/04/2013   Procedure: CATARACT EXTRACTION PHACO  AND INTRAOCULAR LENS PLACEMENT (IOC);  Surgeon: Cherene Mania, MD;  Location: [facility];  Service: Ophthalmology;  Laterality: Left;  CDE:16.26   CHOLECYSTECTOMY     COLONOSCOPY  12/18/2010   Procedure: COLONOSCOPY;  Surgeon: Claudis RAYMOND Rivet, MD;  [facility];  Service: Endoscopy;  Laterality: N/A;  1:00 pm   COLONOSCOPY N/A 03/06/2016   Procedure: COLONOSCOPY;  Surgeon: Claudis RAYMOND Rivet, MD;  [facility];  Service: Endoscopy;  Laterality: N/A;  1200   COLONOSCOPY  N/A 04/18/2021   Procedure: COLONOSCOPY;  Surgeon: Rivet Claudis RAYMOND, MD;  [facility];  Service: Endoscopy;  Laterality: N/A;  805   ESOPHAGEAL DILATION N/A 01/27/2018   Procedure: ESOPHAGEAL DILATION;  Surgeon: Rivet Claudis RAYMOND, MD;  [facility];  Service: Endoscopy;  Laterality: N/A;   ESOPHAGEAL DILATION N/A 02/16/2020   Procedure: ESOPHAGEAL DILATION;  Surgeon: Rivet Claudis RAYMOND, MD;  [facility];  Service: Endoscopy;  Laterality: N/A;   ESOPHAGOGASTRODUODENOSCOPY     ESOPHAGOGASTRODUODENOSCOPY N/A 07/28/2013   Procedure: ESOPHAGOGASTRODUODENOSCOPY (EGD);  Surgeon: Claudis RAYMOND Rivet, MD;  [facility];  Service: Endoscopy;  Laterality: N/A;  125   ESOPHAGOGASTRODUODENOSCOPY N/A 01/27/2018   Procedure: ESOPHAGOGASTRODUODENOSCOPY (EGD);  Surgeon: Rivet Claudis RAYMOND, MD;  [facility];  Service: Endoscopy;  Laterality: N/A;  2:00-moved to 10/30 @ 2:45pm per Ann   ESOPHAGOGASTRODUODENOSCOPY N/A 02/16/2020   Procedure: ESOPHAGOGASTRODUODENOSCOPY (EGD);  Surgeon: Rivet Claudis RAYMOND, MD;  [facility];  Service: Endoscopy;  Laterality: N/A;  10:00   ESOPHAGOGASTRODUODENOSCOPY (EGD) WITH PROPOFOL  N/A 12/30/2022   Procedure: ESOPHAGOGASTRODUODENOSCOPY (EGD) WITH PROPOFOL ;  Surgeon: Eartha Angelia Sieving, MD;  [facility];  Service: Gastroenterology;  Laterality: N/A;  12:45 pm, asa 3   LEFT HEART CATH AND CORONARY ANGIOGRAPHY N/A 07/17/2017   Procedure: LEFT HEART CATH AND CORONARY ANGIOGRAPHY;  Surgeon: Wonda Sharper, MD;  Location: [facility];  Service: Cardiovascular;  Laterality: N/A;   MALONEY DILATION N/A 07/28/2013   Procedure: AGAPITO DILATION;  Surgeon: Claudis RAYMOND Rivet, MD;  [facility];  Service: Endoscopy;  Laterality: N/A;   SAVORY DILATION N/A 07/28/2013   Procedure: SAVORY DILATION;  Surgeon: Claudis RAYMOND Rivet, MD;  [facility];  Service: Endoscopy;  Laterality: N/A;    THYROIDECTOMY  11/20/2011   Procedure: THYROIDECTOMY;  Surgeon: Ida Loader, MD;  Location: [facility] OR;  Service: ENT;  Laterality: Left;  LEFT THYROID  LOBECTOMY   TUBAL LIGATION     38 yrs ago.    Current Outpatient Medications  Medication Sig Dispense Refill   clopidogrel  (PLAVIX ) 75 MG tablet Take 1 tablet (75 mg total) by mouth daily. 30 tablet 2   ezetimibe  (ZETIA ) 10 MG tablet TAKE ONE TABLET BY MOUTH ONCE DAILY. 90 tablet 1   levothyroxine  (SYNTHROID ) 75 MCG tablet TAKE 1 TABLET TUESDAY TO   SUNDAY AND 1/2 TABLET ON   MONDAY 88 tablet 1   mirtazapine  (REMERON ) 15 MG tablet Take 1 tablet (15 mg total) by mouth at bedtime. 30 tablet 2   Multiple Vitamins-Minerals (ALIVE WOMENS 50+ PO) Take 1 tablet by mouth daily.     pantoprazole  (PROTONIX ) 40 MG tablet Take 1 tablet (40 mg total) by mouth daily. 30 tablet 1   No current facility-administered medications for this visit.    Allergies as of 12/31/2023 - Review Complete 11/02/2023  Allergen Reaction Noted   Compazine Other (See Comments) 05/08/2010   Prochlorperazine Other (See Comments)  12/23/2022   Sulfamethoxazole-trimethoprim  11/02/2023   Statins Other (See Comments) 08/22/2014   Sulfa drugs cross reactors Rash 12/23/2022    Social History   Socioeconomic History   Marital status: Widowed    Spouse name: Not on file   Number of children: 2   Years of education: Not on file   Highest education level: High school graduate  Occupational History   Not on file  Tobacco Use   Smoking status: Never   Smokeless tobacco: Never  Vaping Use   Vaping status: Never Used  Substance and Sexual Activity   Alcohol use: No    Alcohol/week: 0.0 standard drinks of alcohol   Drug use: No   Sexual activity: Not Currently  Other Topics Concern   Not on file  Social History Narrative   12/11/20 lives alone long term boyfriend passed away in 2020/04/06  caffeine- one soda daily, coffee, 1/2 cup   2 children   High school   3  grandchildren   Social Drivers of Health   Financial Resource Strain: Low Risk  (01/15/2021)   Overall Financial Resource Strain (CARDIA)    Difficulty of Paying Living Expenses: Not hard at all  Food Insecurity: No Food Insecurity (09/20/2023)   Hunger Vital Sign    Worried About Running Out of Food in the Last Year: Never true    Ran Out of Food in the Last Year: Never true  Transportation Needs: No Transportation Needs (09/20/2023)   PRAPARE - Administrator, Civil Service (Medical): No    Lack of Transportation (Non-Medical): No  Physical Activity: Insufficiently Active (01/15/2021)   Exercise Vital Sign    Days of Exercise per Week: 3 days    Minutes of Exercise per Session: 20 min  Stress: No Stress Concern Present (01/15/2021)   [employer] of Occupational Health - Occupational Stress Questionnaire    Feeling of Stress : Only a little  Social Connections: Moderately Integrated (09/20/2023)   Social Connection and Isolation Panel    Frequency of Communication with Friends and Family: More than three times a week    Frequency of Social Gatherings with Friends and Family: More than three times a week    Attends Religious Services: 1 to 4 times per year    Active Member of [employer] or Organizations: Yes    Attends Banker Meetings: 1 to 4 times per year    Marital Status: Widowed    Review of systems General: negative for malaise, night sweats, fever, chills, weight loss Neck: Negative for lumps, goiter, pain and significant neck swelling Resp: Negative for cough, wheezing, dyspnea at rest CV: Negative for chest pain, leg swelling, palpitations, orthopnea GI: denies melena, hematochezia, vomiting, diarrhea, dysphagia, odyonophagia, or unintentional weight loss. +nausea +early satiety +constipation  MSK: Negative for joint pain or swelling, back pain, and muscle pain. Derm: Negative for itching or rash Psych: Denies depression, anxiety, memory loss,  confusion. No homicidal or suicidal ideation.  Heme: Negative for prolonged bleeding, bruising easily, and swollen nodes. Endocrine: Negative for cold or heat intolerance, polyuria, polydipsia and goiter. Neuro: negative for tremor, gait imbalance, syncope and seizures. The remainder of the review of systems is noncontributory.  Physical Exam: There were no vitals taken for this visit. General:   Alert and oriented. No distress noted. Pleasant and cooperative.  Head:  Normocephalic and atraumatic. Eyes:  Conjuctiva clear without scleral icterus. Mouth:  Oral mucosa pink and moist. Good dentition. No lesions.  Heart: Normal rate and rhythm, s1 and s2 heart sounds present.  Lungs: Clear lung sounds in all lobes. Respirations equal and unlabored. Abdomen:  +BS, soft, non-tender and non-distended. No rebound or guarding. No HSM or masses noted. Derm: No palmar erythema or jaundice Msk:  Symmetrical without gross deformities. Normal posture. Extremities:  Without edema. Neurologic:  Alert and  oriented x4 Psych:  Alert and cooperative. Normal mood and affect.  Invalid input(s): 6 MONTHS   ASSESSMENT: JAQUANNA BALLENTINE is a 78 y.o. female presenting today for follow up of gastroparesis and IBS with constipation  Patient with sequela of symptoms from combination of gastroparesis and IBS with constipation with ongoing hard stools, difficulty moving her bowels, nausea and fullness/early satiety. Taking miralax  only as needed when constipation is severe. Domperidone discussed at multiple previous visits but she has never tried this. She reports she does not like to take lots of medications though she is ready to try something to feel better. At this time, I think she may benefit from prucalopride which can accelerate gastric emptying and colon transit time and therefore may target both of her issues. We will try prucalopride 1mg  daily. She should continue with smaller, high protein meals to maintain  nutrition and aim for good water  intake over the course of each day.    PLAN:  -Increase water  intake, aim for around 60 oz over the course of the day -smaller, high protein meals  -start prucalopride 1mg   (GoodRX card provided, will [facility] if insurance does not cover as it appears it would be around $50 this way)   All questions were answered, patient verbalized understanding and is in agreement with plan as outlined above.    Follow Up: 3 months   Alyona Romack L. Mariette, MSN, APRN, AGNP-C Adult-Gerontology Nurse Practitioner [facility]  I have reviewed the note and agree with the APP's assessment as described in this progress note  Toribio Fortune, MD Gastroenterology and Hepatology [facility]

## 2023-12-31 NOTE — Telephone Encounter (Signed)
 PA completed for Motegrity 1 mg tablets via cover my meds.      Per office note from 12/31/23:I am also giving you a good Rx card, if this medication is not covered, we can try sending [facility] and you can use this card in place of insurance as it appears this would make the medication around $50

## 2023-12-31 NOTE — Patient Instructions (Signed)
 As discussed I suspect you will feel much better if we get your bowels moving and your gastroparesis better managed. I am sending prucalopride for you to try which should help with both  Increase water  intake, aim for around 60 oz over the course of the day Eat smaller, higher protein meals to keep nutrition intake good  I am also giving you a good Rx card, if this medication is not covered, we can [facility] and you can use this card in place of insurance as it appears this would make the medication around $50  Follow up 3 months  It was a pleasure to see you today. I want to create trusting relationships with patients and provide genuine, compassionate, and quality care. I truly value your feedback! please be on the lookout for a survey regarding your visit with me today. I appreciate your input about our visit and your time in completing this!    Haroldine Redler L. Basir Niven, MSN, APRN, AGNP-C Adult-Gerontology Nurse Practitioner [facility] at [facility]

## 2024-01-01 NOTE — Telephone Encounter (Signed)
 Fax from [place] stating [employer] denied. In doing PA it did not ask for any supporting documents.   Why was coverage for this drug denied? We denied coverage for this drug because: The requested drug is not on your plan's formulary (list of  covered drugs). Your Medicare Part D drug plan was asked to cover a drug that is not on the formulary  (this is called a formulary exception). Your prescriber did not provide the detailed information that is  required in order to approve the request. To receive a formulary exception, per the Part D Coverage  Determination guidance Section 40.5.2 and 40.5.3, your prescriber must provide information that  documents at least one of the following has occurred:  - You have tried the formulary drugs for the treatment of your condition and they did not work for you. OR - The formulary drugs could cause adverse effects. OR - The formulary drugs would be less effective for your condition than the requested drug.  Talk to your prescriber to see if the following covered alternative(s) would be right for you:  Linzess capsule (quantity limit of 30 capsules every 30 days) Share this notice with your prescribing provider and discuss next steps. If your prescribing provider asked for  coverage for this drug on your behalf, we already shared this denial notice with them.

## 2024-01-04 NOTE — Telephone Encounter (Signed)
 Patient says she would like to try an alternative. Please advise. Thanks   I spoke with the patient and made her aware per [facility], So looks like this is not going to be covered with her insurance   We have a few different options: We can use goodrx card and [facility] for around $56 for a months supply Or We can try a different constipation agent and utilize domperidone from [place] for her gastroparesis   Let me know which she prefers

## 2024-01-05 ENCOUNTER — Other Ambulatory Visit (INDEPENDENT_AMBULATORY_CARE_PROVIDER_SITE_OTHER): Payer: Self-pay | Admitting: Gastroenterology

## 2024-01-05 ENCOUNTER — Telehealth (INDEPENDENT_AMBULATORY_CARE_PROVIDER_SITE_OTHER): Payer: Self-pay

## 2024-01-05 MED ORDER — LUBIPROSTONE 8 MCG PO CAPS: 8.0000 ug | ORAL_CAPSULE | Freq: Two times a day (BID) | ORAL | 1 refills | Status: DC

## 2024-01-05 NOTE — Telephone Encounter (Signed)
 I Called and left a message for the patient to return call to the office.

## 2024-01-05 NOTE — Telephone Encounter (Signed)
 Notice of Denial of Medicare Part D Drug Coverage Date: 01/05/2024 Enrollee Name: Grace [name] Member Number: [id number] Coverage of your drug was denied We denied coverage under Medicare Part D for the following drug(s) you or your prescribing provider asked for: LUBIPROSTONE  Capsule Why was coverage for this drug denied? We denied coverage for this drug because: The requested drug is not on your plan's formulary (list of covered drugs). Your Medicare Part D drug plan was asked to cover a drug that is not on the formulary (this is called a formulary exception). Your prescriber did not provide the detailed information that is required in order to approve the request. To receive a formulary exception, per the Part D Coverage Determination guidance Section 40.5.2 and 40.5.3, your prescriber must provide information that documents at least one of the following has occurred: - You have tried the formulary drugs for the treatment of your condition and they did not work for you. OR - The formulary drugs could cause adverse effects. OR - The formulary drugs would be less effective for your condition than the requested drug. Talk to your prescriber to see if the following covered alternative(s) would be right for you: Linzess capsule (quantity limit of 30 capsules every 30 days). Share this notice with your prescribing provider and discuss next steps. If your prescribing provider asked for coverage for this drug on your behalf, we already shared this denial notice with them. You h

## 2024-01-05 NOTE — Telephone Encounter (Signed)
 Notice of Denial of Medicare Part D Drug Coverage Date: 01/05/2024 Enrollee Name: GIAMARIE BUECHE Member Number: [id number] Coverage of your drug was denied We denied coverage under Medicare Part D for the following drug(s) you or your prescribing provider asked for: LUBIPROSTONE  Capsule Why was coverage for this drug denied? We denied coverage for this drug because: The requested drug is not on your plan's formulary (list of covered drugs). Your Medicare Part D drug plan was asked to cover a drug that is not on the formulary (this is called a formulary exception). Your prescriber did not provide the detailed information that is required in order to approve the request. To receive a formulary exception, per the Part D Coverage Determination guidance Section 40.5.2 and 40.5.3, your prescriber must provide information that documents at least one of the following has occurred: - You have tried the formulary drugs for the treatment of your condition and they did not work for you. OR - The formulary drugs could cause adverse effects. OR - The formulary drugs would be less effective for your condition than the requested drug. Talk to your prescriber to see if the following covered alternative(s) would be right for you: Linzess capsule (quantity limit of 30 capsules every 30 days). Share this notice with your prescribing provider and discuss next steps. If your prescribing provider asked for coverage for this drug on your behalf, we already shared this denial notice with them. You h

## 2024-01-05 NOTE — Telephone Encounter (Signed)
 I spoke with the patient and made her aware per [facility], I have sent amitiza  8mcg BID, I cannot tell if this will be covered or not so she should let us  know if it is too expensive. Can we give her the info on domperidone from the [place]?   Patient states understanding regarding the amitiza  8 mcg bid, and will let us  know if any issues. I did give the patient the information for the in house pharmacy out of [place], for the domperidone, the phone # of [phone], But how is the patient to take the medication ? Once per day, twice per day? I told the patient I would call her back with this information.   Thanks,

## 2024-01-06 ENCOUNTER — Other Ambulatory Visit (INDEPENDENT_AMBULATORY_CARE_PROVIDER_SITE_OTHER): Payer: Self-pay

## 2024-01-06 DIAGNOSIS — K3184 Gastroparesis: Secondary | ICD-10-CM

## 2024-01-06 DIAGNOSIS — K581 Irritable bowel syndrome with constipation: Secondary | ICD-10-CM

## 2024-01-06 MED ORDER — LINACLOTIDE 145 MCG PO CAPS: 145.0000 ug | ORAL_CAPSULE | Freq: Every day | ORAL | 3 refills | Status: AC

## 2024-01-06 NOTE — Telephone Encounter (Signed)
 I called and left a Vm asked that the patient please return call to the office.

## 2024-01-07 ENCOUNTER — Encounter (INDEPENDENT_AMBULATORY_CARE_PROVIDER_SITE_OTHER): Payer: Self-pay

## 2024-01-07 NOTE — Telephone Encounter (Signed)
 I spoke with the patient and made her aware to reach out to IN house pharmacy to get the domperidone and it would be 10mg  up to three times per day, 15-30 minutes prior to meals. Patient states understanding.   Patient made aware her insurance would only cover Linzess and she says she picked this up and it was $100.00 co pay. I explained to her this is the only medication her insurance would cover. Patient aware to take the linzess one time per day.   Patient states understanding.

## 2024-01-07 NOTE — Telephone Encounter (Signed)
 Tried calling patient no answer. I left a Vm asked that the patient please return call to the office.

## 2024-01-13 ENCOUNTER — Encounter (INDEPENDENT_AMBULATORY_CARE_PROVIDER_SITE_OTHER): Payer: Self-pay | Admitting: Gastroenterology

## 2024-02-24 ENCOUNTER — Other Ambulatory Visit (HOSPITAL_COMMUNITY): Payer: Self-pay | Admitting: Internal Medicine

## 2024-02-24 ENCOUNTER — Encounter (INDEPENDENT_AMBULATORY_CARE_PROVIDER_SITE_OTHER): Payer: Self-pay | Admitting: Gastroenterology

## 2024-02-24 DIAGNOSIS — Z1231 Encounter for screening mammogram for malignant neoplasm of breast: Secondary | ICD-10-CM

## 2024-03-28 ENCOUNTER — Ambulatory Visit (HOSPITAL_COMMUNITY)
Admission: RE | Admit: 2024-03-28 | Discharge: 2024-03-28 | Disposition: A | Discharge status: Home / Self Care | Source: Ambulatory Visit | Attending: Internal Medicine | Admitting: Internal Medicine

## 2024-03-28 DIAGNOSIS — Z1231 Encounter for screening mammogram for malignant neoplasm of breast: Secondary | ICD-10-CM | POA: Insufficient documentation

## 2024-03-29 ENCOUNTER — Other Ambulatory Visit (HOSPITAL_COMMUNITY): Payer: Self-pay | Admitting: Internal Medicine

## 2024-03-29 DIAGNOSIS — R519 Headache, unspecified: Secondary | ICD-10-CM

## 2024-04-02 ENCOUNTER — Ambulatory Visit (HOSPITAL_COMMUNITY)
Admission: RE | Admit: 2024-04-02 | Discharge: 2024-04-02 | Disposition: A | Discharge status: Home / Self Care | Source: Ambulatory Visit | Attending: Internal Medicine | Admitting: Internal Medicine

## 2024-04-02 DIAGNOSIS — G3184 Mild cognitive impairment, so stated: Secondary | ICD-10-CM | POA: Insufficient documentation

## 2024-04-02 DIAGNOSIS — R519 Headache, unspecified: Secondary | ICD-10-CM | POA: Diagnosis present

## 2024-04-02 DIAGNOSIS — R0989 Other specified symptoms and signs involving the circulatory and respiratory systems: Secondary | ICD-10-CM | POA: Insufficient documentation

## 2024-04-02 MED ORDER — GADOBUTROL 1 MMOL/ML IV SOLN
5.0000 mL | Freq: Once | INTRAVENOUS | Status: AC | PRN
  Administered 2024-04-02: 5 mL via INTRAVENOUS

## 2024-04-03 ENCOUNTER — Ambulatory Visit (HOSPITAL_COMMUNITY): Admission: RE | Admit: 2024-04-03 | Source: Ambulatory Visit

## 2024-04-03 ENCOUNTER — Encounter (HOSPITAL_COMMUNITY): Payer: Self-pay

## 2024-04-07 ENCOUNTER — Ambulatory Visit (HOSPITAL_COMMUNITY)

## 2024-04-07 ENCOUNTER — Telehealth: Payer: Self-pay | Admitting: Diagnostic Neuroimaging

## 2024-04-07 NOTE — Telephone Encounter (Signed)
 Amber @ Dr Audria office states Dr Orpha would like a call from MD to discuss [facility]  for pt.  The call back from Dr Orpha is [phone]

## 2024-04-11 NOTE — Telephone Encounter (Signed)
 Amber @ Dr Audria office states Dr Orpha   Would still like a call back at the [phone], she asked the message this time be sent with higher priority.

## 2024-04-14 NOTE — Telephone Encounter (Signed)
 I called Dr. Orpha.  I will see patient on Monday for follow-up of memory/cognitive symptoms, now concerning for progression to dementia.  EDUARD FABIENE HANLON, MD 04/14/2024, 6:13 PM Certified in Neurology, Neurophysiology and Neuroimaging  [facility] [address] [place], [place] [address] [phone]

## 2024-04-18 ENCOUNTER — Encounter: Payer: Self-pay | Admitting: Diagnostic Neuroimaging

## 2024-04-18 ENCOUNTER — Ambulatory Visit: Admitting: Diagnostic Neuroimaging

## 2024-04-18 VITALS — BP 143/71 | HR 76 | Ht 62.0 in | Wt 116.2 lb

## 2024-04-18 DIAGNOSIS — G301 Alzheimer's disease with late onset: Secondary | ICD-10-CM

## 2024-04-18 DIAGNOSIS — F02A0 Dementia in other diseases classified elsewhere, mild, without behavioral disturbance, psychotic disturbance, mood disturbance, and anxiety: Secondary | ICD-10-CM

## 2024-04-18 MED ORDER — MEMANTINE HCL 10 MG PO TABS: 10.0000 mg | ORAL_TABLET | Freq: Two times a day (BID) | ORAL | 12 refills | Status: AC

## 2024-04-18 NOTE — Patient Instructions (Addendum)
" °  MILD DEMENTIA DUE TO ALZHEIMER'S DISEASE: MEMORY LOSS (MMSE 21/30; also with some anxiety, depression, GI issues; some changes in ADLs) - start memantine  10mg  at bedtime; increase to twice a day after 1-2 weeks - discussed anti-amyloid therapy, but would be challenging due to her current living / support situation (living alone; no direct caregiver); advised her to think about moving in with someone first - try to stay active physically and get some exercise (at least 15-30 minutes per day) - eat a nutritious diet with lean protein, plants / vegetables, whole grains; avoid ultra-processed foods - increase social activities, brain stimulation, games, puzzles, hobbies, crafts, arts, music; try new activities; keep it fun! - aim for at least 7-8 hours sleep per night (or more) - avoid smoking and alcohol - caution with living alone, medications, finances, driving - safety / supervision issues reviewed - caregiver resources provided (including [email or web address]) "

## 2024-04-18 NOTE — Progress Notes (Signed)
 "  [facility]  PATIENT: Grace [name] DOB: 05-Dec-1945  REFERRING CLINICIAN: Orpha Yancey LABOR, MD  HISTORY FROM: patient  REASON FOR VISIT: follow up    HISTORICAL  CHIEF COMPLAINT:  Chief Complaint  Patient presents with   RM 7    Patient is here for memory - she was last seen in July 2025 by Dr. SHAUNNA for memory MMSE score was 23.  With friends, [place], and [place].  MMSE 21/30    HISTORY OF PRESENT ILLNESS:   UPDATE (04/18/24, VRP): Since last visit, having progression of memory loss, esp short term issues. More issues with paying bills, and getting help from friends now. Having more difficulty managing her multiple properties. Still living alone, but has some mild support from her 2 friends.   UPDATE (10/29/23, VRP): Since last visit, doing well until June 2025. Then new event of slurred speech, aphasia, headaches, x 4 hours. Went to ER and admitted for TIA workup. MRI negative for stroke.  Continues with mild memory loss, but no major changes in ADLs, except some difficulty with change (such as changing her bank account). Other ADLs intact.   UPDATE (12/11/20, VRP): Since last visit, more anxiety, memory loss, TIA events, decreased energy. Higher anxiety state, depression, fatigue. Also her close friend died in 02/16/2022from covid / afib.  PRIOR HPI (03/17/17): 79 year old female here for evaluation of abnormal spells.  Patient has history of hypercholesterolemia.  November 2013 patient had admission for possible TIA.  She had blurred vision, slurred speech, word finding difficulties.  Stroke workup was completed.  She was found to have left ICA intracranial stenosis on MRA, but follow-up cerebral angiogram showed no evidence of stenosis, and therefore was felt to be artifactual.  Patient was treated medically.  Patient was evaluated at [facility] neurology in June 2014, for abnormal visual changes, speech difficulty and headaches.  She was diagnosed with migraine aura  without headache.  In 05/16/2016 patient had recurrence of symptoms.  She went to the hospital in November 2018 for blurred vision, slurred speech, word finding difficulties.  Symptoms lasted for 10 minutes.  Then she had a headache bilaterally which lasted for approximately 10 minutes.  She had a milder episode following that.  She was admitted to the hospital for TIA evaluation.  She was found to have similar left ICA cavernous carotid narrowing on MRA, in the area of atherosclerotic calcification on CT.  Stroke workup was completed.  Patient was treated medically.  Since that time patient is doing well.  No further recurrence of symptoms.   REVIEW OF SYSTEMS: Full 14 system review of systems performed and negative with exception of: as per HPI.    ALLERGIES: Allergies  Allergen Reactions   Compazine Other (See Comments)    Slurred speech   Prochlorperazine Other (See Comments)    Unknown reaction   Sulfamethoxazole-Trimethoprim     Other Reaction(s): Unknown   Statins Other (See Comments)    myalgias   Sulfa Drugs Cross Reactors Rash    HOME MEDICATIONS: Outpatient Medications Prior to Visit  Medication Sig Dispense Refill   ezetimibe  (ZETIA ) 10 MG tablet TAKE ONE TABLET BY MOUTH ONCE DAILY. 90 tablet 1   levothyroxine  (SYNTHROID ) 75 MCG tablet TAKE 1 TABLET TUESDAY TO   SUNDAY AND 1/2 TABLET ON   MONDAY 88 tablet 1   linaclotide  (LINZESS ) 145 MCG CAPS capsule Take 1 capsule (145 mcg total) by mouth daily. 30 capsule 3   mirtazapine  (REMERON ) 15  MG tablet Take 1 tablet (15 mg total) by mouth at bedtime. 30 tablet 2   Multiple Vitamins-Minerals (ALIVE WOMENS 50+ PO) Take 1 tablet by mouth daily.     OVER THE COUNTER MEDICATION Domperidone 10 mg up to TID with meals per Mitzie Boettcher, NP. ([facility])     No facility-administered medications prior to visit.    PAST MEDICAL HISTORY: Past Medical History:  Diagnosis Date   Dry eyes, bilateral    Dyspnea    Frequency  of urination    Heart murmur    Hyperlipidemia    Hypothyroid    Migraine    Mitral valve prolapse    Skin cancer    Stroke [facility])    TIA (transient ischemic attack) 02/19/2012    PAST SURGICAL HISTORY: Past Surgical History:  Procedure Laterality Date   BALLOON DILATION N/A 07/28/2013   Procedure: BALLOON DILATION;  Surgeon: Claudis RAYMOND Rivet, MD;  [facility];  Service: Endoscopy;  Laterality: N/A;   BIOPSY  02/16/2020   Procedure: BIOPSY;  Surgeon: Rivet Claudis RAYMOND, MD;  [facility];  Service: Endoscopy;;   BIOPSY  12/30/2022   Procedure: BIOPSY;  Surgeon: Eartha Angelia Sieving, MD;  [facility];  Service: Gastroenterology;;   BREAST BIOPSY Left 04/09/2022   BENIGN BREAST PARENCHYMA WITH FIBROADENOMATOID CHANGES AND CALCIFICATION   BREAST EXCISIONAL BIOPSY Left    benign-pt unsure when   CATARACT EXTRACTION W/PHACO Right 07/11/2013   Procedure: CATARACT EXTRACTION PHACO AND INTRAOCULAR LENS PLACEMENT (IOC);  Surgeon: Cherene Mania, MD;  Location: [facility];  Service: Ophthalmology;  Laterality: Right;  CDE 9.99   CATARACT EXTRACTION W/PHACO Left 08/04/2013   Procedure: CATARACT EXTRACTION PHACO AND INTRAOCULAR LENS PLACEMENT (IOC);  Surgeon: Cherene Mania, MD;  Location: [facility];  Service: Ophthalmology;  Laterality: Left;  CDE:16.26   CHOLECYSTECTOMY     COLONOSCOPY  12/18/2010   Procedure: COLONOSCOPY;  Surgeon: Claudis RAYMOND Rivet, MD;  [facility];  Service: Endoscopy;  Laterality: N/A;  1:00 pm   COLONOSCOPY N/A 03/06/2016   Procedure: COLONOSCOPY;  Surgeon: Claudis RAYMOND Rivet, MD;  [facility];  Service: Endoscopy;  Laterality: N/A;  1200   COLONOSCOPY N/A 04/18/2021   Procedure: COLONOSCOPY;  Surgeon: Rivet Claudis RAYMOND, MD;  [facility];  Service: Endoscopy;  Laterality: N/A;  805   ESOPHAGEAL DILATION N/A 01/27/2018   Procedure: ESOPHAGEAL DILATION;  Surgeon: Rivet Claudis RAYMOND, MD;  [facility];  Service:  Endoscopy;  Laterality: N/A;   ESOPHAGEAL DILATION N/A 02/16/2020   Procedure: ESOPHAGEAL DILATION;  Surgeon: Rivet Claudis RAYMOND, MD;  [facility];  Service: Endoscopy;  Laterality: N/A;   ESOPHAGOGASTRODUODENOSCOPY     ESOPHAGOGASTRODUODENOSCOPY N/A 07/28/2013   Procedure: ESOPHAGOGASTRODUODENOSCOPY (EGD);  Surgeon: Claudis RAYMOND Rivet, MD;  [facility];  Service: Endoscopy;  Laterality: N/A;  125   ESOPHAGOGASTRODUODENOSCOPY N/A 01/27/2018   Procedure: ESOPHAGOGASTRODUODENOSCOPY (EGD);  Surgeon: Rivet Claudis RAYMOND, MD;  [facility];  Service: Endoscopy;  Laterality: N/A;  2:00-moved to 10/30 @ 2:45pm per Ann   ESOPHAGOGASTRODUODENOSCOPY N/A 02/16/2020   Procedure: ESOPHAGOGASTRODUODENOSCOPY (EGD);  Surgeon: Rivet Claudis RAYMOND, MD;  [facility];  Service: Endoscopy;  Laterality: N/A;  10:00   ESOPHAGOGASTRODUODENOSCOPY (EGD) WITH PROPOFOL  N/A 12/30/2022   Procedure: ESOPHAGOGASTRODUODENOSCOPY (EGD) WITH PROPOFOL ;  Surgeon: Eartha Angelia Sieving, MD;  [facility];  Service: Gastroenterology;  Laterality: N/A;  12:45 pm, asa 3  LEFT HEART CATH AND CORONARY ANGIOGRAPHY N/A 07/17/2017   Procedure: LEFT HEART CATH AND CORONARY ANGIOGRAPHY;  Surgeon: Wonda Sharper, MD;  Location: [facility] IN[facility]AS[facility] CV [facility];  Service: Cardiovascular;  Laterality: N/A;   MALONEY DILATION N/A 07/28/2013   Procedure: AGAPITO DILATION;  Surgeon: Claudis RAYMOND Rivet, MD;  [facility];  Service: Endoscopy;  Laterality: N/A;   SAVORY DILATION N/A 07/28/2013   Procedure: SAVORY DILATION;  Surgeon: Claudis RAYMOND Rivet, MD;  [facility];  Service: Endoscopy;  Laterality: N/A;   THYROIDECTOMY  11/20/2011   Procedure: THYROIDECTOMY;  Surgeon: Ida Loader, MD;  Location: [facility] OR;  Service: ENT;  Laterality: Left;  LEFT THYROID  LOBECTOMY   TUBAL LIGATION     39 yrs ago.    FAMILY HISTORY: Family History  Problem Relation Age of Onset   Heart disease Mother     Hypertension Mother    Stroke Mother    Thyroid  disease Mother    Dementia Mother    Stroke Maternal Grandmother    Cancer Paternal Grandmother        breast   Colon cancer Neg Hx     SOCIAL HISTORY:  Social History   Socioeconomic History   Marital status: Widowed    Spouse name: Not on file   Number of children: 2   Years of education: Not on file   Highest education level: High school graduate  Occupational History   Not on file  Tobacco Use   Smoking status: Never   Smokeless tobacco: Never  Vaping Use   Vaping status: Never Used  Substance and Sexual Activity   Alcohol use: No    Alcohol/week: 0.0 standard drinks of alcohol   Drug use: No   Sexual activity: Not Currently  Other Topics Concern   Not on file  Social History Narrative   12/11/20 lives alone long term boyfriend passed away in Apr 27, 2020  caffeine- one soda daily, coffee, 1/2 cup   2 children   High school   3 grandchildren   Social Drivers of Health   Tobacco Use: Low Risk (04/18/2024)   Patient History    Smoking Tobacco Use: Never    Smokeless Tobacco Use: Never    Passive Exposure: Not on file  Financial Resource Strain: Not on file  Food Insecurity: No Food Insecurity (09/20/2023)   Epic    Worried About Programme Researcher, Broadcasting/film/video in the Last Year: Never true    Ran Out of Food in the Last Year: Never true  Transportation Needs: No Transportation Needs (09/20/2023)   Epic    Lack of Transportation (Medical): No    Lack of Transportation (Non-Medical): No  Physical Activity: Not on file  Stress: Not on file  Social Connections: Moderately Integrated (09/20/2023)   Social Connection and Isolation Panel    Frequency of Communication with Friends and Family: More than three times a week    Frequency of Social Gatherings with Friends and Family: More than three times a week    Attends Religious Services: 1 to 4 times per year    Active Member of [employer] or Organizations: Yes    Attends Tax Inspector Meetings: 1 to 4 times per year    Marital Status: Widowed  Intimate Partner Violence: Not At Risk (09/20/2023)   Epic    Fear of Current or Ex-Partner: No    Emotionally Abused: No    Physically Abused: No    Sexually Abused: No  Depression (  EYV7-0): Not on file  Alcohol Screen: Not on file  Housing: Unknown (09/20/2023)   Epic    Unable to Pay for Housing in the Last Year: Patient declined    Number of Times Moved in the Last Year: 1    Homeless in the Last Year: Patient declined  Utilities: Not At Risk (09/20/2023)   Epic    Threatened with loss of utilities: No  Health Literacy: Not on file     PHYSICAL EXAM  GENERAL EXAM/CONSTITUTIONAL: Vitals:  Vitals:   04/18/24 1045 04/18/24 1057  BP: (!) 157/81 (!) 143/71  Pulse: 76   Weight: 116 lb 3.2 oz (52.7 kg)   Height: 5' 2 (1.575 m)    Body mass index is 21.25 kg/m. No results found.  Patient is in no distress; well developed, nourished and groomed; neck is supple  CARDIOVASCULAR: Examination of carotid arteries is normal; no carotid bruits Regular rate and rhythm, no murmurs Examination of peripheral vascular system by observation and palpation is normal  EYES: Ophthalmoscopic exam of optic discs and posterior segments is normal; no papilledema or hemorrhages  MUSCULOSKELETAL: Gait, strength, tone, movements noted in Neurologic exam below  NEUROLOGIC: MENTAL STATUS:     04/18/2024   10:55 AM 10/29/2023    3:49 PM 12/11/2020   11:55 AM  MMSE - Mini Mental State Exam  Orientation to time 2 4 5   Orientation to Place 5 4 5   Registration 3 3 3   Attention/ Calculation 2 3 2   Recall 0 1 1  Language- name 2 objects 2 2 2   Language- repeat 1 1 0  Language- follow 3 step command 3 3 3   Language- read & follow direction 1 1 1   Write a sentence 1 1 1   Copy design 1 0 0  Total score 21 23 23    awake, alert, oriented to person, place and time recent and remote memory intact normal attention and  concentration language fluent, comprehension intact, naming intact,  fund of knowledge appropriate  CRANIAL NERVE:  2nd - no papilledema on fundoscopic exam 2nd, 3rd, 4th, 6th - pupils equal and reactive to light, visual fields full to confrontation, extraocular muscles intact, no nystagmus 5th - facial sensation symmetric 7th - facial strength symmetric 8th - hearing intact 9th - palate elevates symmetrically, uvula midline 11th - shoulder shrug symmetric 12th - tongue protrusion midline  MOTOR:  normal bulk and tone, full strength in the BUE, BLE  SENSORY:  normal and symmetric to light touch, pinprick, temperature, vibration  COORDINATION:  finger-nose-finger, fine finger movements normal  REFLEXES:  deep tendon reflexes present and symmetric  GAIT/STATION:  narrow based gait; able to walk tandem; romberg is negative    DIAGNOSTIC DATA (LABS, IMAGING, TESTING) - I reviewed patient records, labs, notes, testing and imaging myself where available.  Lab Results  Component Value Date   WBC 6.6 11/02/2023   HGB 15.5 (H) 11/02/2023   HCT 44.4 11/02/2023   MCV 89.5 11/02/2023   PLT 215 11/02/2023      Component Value Date/Time   NA 131 (L) 11/02/2023 1004   NA 136 11/07/2022 1434   K 4.2 11/02/2023 1004   CL 95 (L) 11/02/2023 1004   CO2 25 11/02/2023 1004   GLUCOSE 106 (H) 11/02/2023 1004   BUN 9 11/02/2023 1004   BUN 6 (L) 11/07/2022 1434   CREATININE 0.67 11/02/2023 1004   CREATININE 0.68 01/20/2019 0946   CALCIUM  9.3 11/02/2023 1004   PROT 7.9 11/02/2023 1004  PROT 6.9 09/23/2021 1604   ALBUMIN 4.4 11/02/2023 1004   ALBUMIN 4.3 09/23/2021 1604   AST 26 11/02/2023 1004   ALT 18 11/02/2023 1004   ALKPHOS 64 11/02/2023 1004   BILITOT 1.1 11/02/2023 1004   BILITOT 0.7 09/23/2021 1604   GFRNONAA >60 11/02/2023 1004   GFRNONAA 87 01/20/2019 0946   GFRAA >60 08/24/2019 1604   GFRAA 101 01/20/2019 0946   Lab Results  Component Value Date   CHOL 125  09/21/2023   HDL 41 09/21/2023   LDLCALC 72 09/21/2023   TRIG 59 09/21/2023   CHOLHDL 3.0 09/21/2023   Lab Results  Component Value Date   HGBA1C 5.0 09/21/2023   Lab Results  Component Value Date   VITAMINB12 275 11/17/2018   Lab Results  Component Value Date   TSH 1.666 09/20/2023    02/20/12 MRI brain  - No acute or focal intracranial abnormality.  Minimal white matter disease is stable from 2010.  02/20/12 MRA head  - 75-90% of stenosis left ICA cavernous/supraclinoid junction. - Mild intracranial atherosclerotic changes as described without proximal MCA lesion.  03/08/12 cerebral angiogram 1.  Angiographically no evidence of occlusions, stenosis, dissections, aneurysms or of arteriovenous shunting 2.  Venous outflow within normal limits.   02/09/17 MRI brain - No acute finding. Unremarkable study for age that is stable from 2013.   02/09/17 MRA head  1.  No acute finding or branch occlusion. 2. Mild to moderate narrowing at the anterior genu left cavernous carotid where there is atherosclerotic calcification by CT.  02/10/17 TTE - Left ventricle: The cavity size was normal. Wall thickness was   increased in a pattern of mild LVH. Systolic function was normal.   The estimated ejection fraction was in the range of 60% to 65%.   Wall motion was normal; there were no regional wall motion   abnormalities. Doppler parameters are consistent with abnormal   left ventricular relaxation (grade 1 diastolic dysfunction). - Aortic valve: There was trivial regurgitation. - Atrial septum: No defect or patent foramen ovale was identified.  09/20/23 MRI brain  1. No acute intracranial abnormality. 2. Mild chronic small vessel ischemic disease.  09/20/23 Normal CTA of the head and neck.  09/21/23 TTE  1. Left ventricular ejection fraction, by estimation, is 60 to 65%. The  left ventricle has normal function. The left ventricle has no regional  wall motion abnormalities. Left  ventricular diastolic parameters are  indeterminate.   2. Right ventricular systolic function is normal. The right ventricular  size is normal. Tricuspid regurgitation signal is inadequate for assessing  PA pressure.   3. The mitral valve is normal in structure. No evidence of mitral valve  regurgitation. No evidence of mitral stenosis.   4. The aortic valve is tricuspid. Aortic valve regurgitation is mild to  moderate. No aortic stenosis is present.   5. The inferior vena cava is normal in size with greater than 50%  respiratory variability, suggesting right atrial pressure of 3 mmHg.   6. Agitated saline contrast bubble study was negative, with no evidence  of any interatrial shunt.   04/02/24 MRA head - Normal   04/13/24  Beta-amyloid 42/40 Ratio 0.1 L    ASSESSMENT AND PLAN  79 y.o. year old female here with:  Dx:  1. Mild late onset Alzheimer's dementia without behavioral disturbance, psychotic disturbance, mood disturbance, or anxiety (HCC)     PLAN:  MILD DEMENTIA DUE TO ALZHEIMER'S DISEASE: MEMORY LOSS (MMSE 21/30; also with  some anxiety, depression, GI issues; some changes in ADLs) - start memantine  10mg  at bedtime; increase to twice a day after 1-2 weeks - discussed anti-amyloid therapy, but would be challenging due to her current living / support situation (living alone; no direct caregiver); advised her to think about moving in with someone first - try to stay active physically and get some exercise (at least 15-30 minutes per day) - eat a nutritious diet with lean protein, plants / vegetables, whole grains; avoid ultra-processed foods - increase social activities, brain stimulation, games, puzzles, hobbies, crafts, arts, music; try new activities; keep it fun! - aim for at least 7-8 hours sleep per night (or more) - avoid smoking and alcohol - caution with living alone, medications, finances, driving - safety / supervision issues reviewed - caregiver resources  provided (including [email or web address])  TIA vs complicated migraine (June 2025; speech difficulty x 4 hours; HA) - continue clopidogrel  75mg , zetia  - monitor migraine HA and sxs for now (infrequent for now; treat conservatively)  Meds ordered this encounter  Medications   memantine  (NAMENDA ) 10 MG tablet    Sig: Take 1 tablet (10 mg total) by mouth 2 (two) times daily.    Dispense:  60 tablet    Refill:  12   Return for return to PCP, pending if symptoms worsen or fail to improve.    EDUARD FABIENE HANLON, MD 04/18/2024, 12:09 PM Certified in Neurology, Neurophysiology and Neuroimaging  [facility] [address] [place], [place] [address] [phone]  "

## 2024-05-05 ENCOUNTER — Ambulatory Visit: Admitting: Physician Assistant

## 2024-05-05 NOTE — Progress Notes (Unsigned)
" °  Cardiology Office Note:  .   Date:  05/05/2024  ID:  Grace [name], DOB 1945-08-28, MRN [id number] PCP: Orpha Yancey LABOR, MD  [facility] Providers Cardiologist:  Jayson Sierras, MD { Click to update primary MD,subspecialty MD or APP then REFRESH:1}   History of Present Illness: .   Grace [name] is a 79 y.o. female *** with PMHx of ### who reports to [facility] office for follow up.   Pertinent cardiac medical history:    Last seen in heartcare 03/2023 with Dr. Sierras   Recent hospitalization   Today, reports ### and denies ###.  Denies chest pain, shortness of breath, palpitations, syncope, presyncope, dizziness, orthopnea, PND, swelling or significant weight changes, acute bleeding, or claudication.   Reports compliance with medications.  Dietary habitats:  Activity level:  Social: Denies tobacco use/alcohol/drug use  Denies any recent hospitalizations or visits to the emergency department.   ROS: 10 point review of system has been reviewed and considered negative except ones been listed in the HPI.   Studies Reviewed: .        CV Studies: Cardiac studies reviewed are outlined and summarized above. Otherwise please see EMR for full report.   Risk Assessment/Calculations:   {Does this patient have ATRIAL FIBRILLATION?:[phone]} No BP recorded.  {Refresh Note OR Click here to enter BP  :1}***       Physical Exam:   VS:  There were no vitals taken for this visit.   Wt Readings from Last 3 Encounters:  04/18/24 116 lb 3.2 oz (52.7 kg)  12/31/23 118 lb 14.4 oz (53.9 kg)  11/02/23 117 lb (53.1 kg)    GEN: Well nourished, well developed in no acute distress while sitting in chair.  NECK: No JVD; No carotid bruits CARDIAC: ***RRR, no murmurs, rubs, gallops RESPIRATORY:  Clear to auscultation without rales, wheezing or rhonchi  ABDOMEN: Soft, non-tender, non-distended EXTREMITIES:  No edema; No deformity   ASSESSMENT AND PLAN: .   ***    {Are you ordering a  CV Procedure (e.g. stress test, cath, DCCV, TEE, etc)?   Press F2        :[phone]}  Dispo: ***  Signed, Lorette CINDERELLA Kapur, PA-C  "

## 2024-05-24 ENCOUNTER — Ambulatory Visit: Admitting: Physician Assistant
